# Patient Record
Sex: Female | Born: 1945 | Race: White | Hispanic: No | Marital: Married | Smoking: Former smoker
Health system: Southern US, Community
[De-identification: ages and names within clinical notes are randomized; demographics above are authoritative.]

## PROBLEM LIST (undated history)

## (undated) DIAGNOSIS — K219 Gastro-esophageal reflux disease without esophagitis: Secondary | ICD-10-CM

## (undated) DIAGNOSIS — R51 Headache: Secondary | ICD-10-CM

## (undated) DIAGNOSIS — E785 Hyperlipidemia, unspecified: Secondary | ICD-10-CM

## (undated) DIAGNOSIS — F329 Major depressive disorder, single episode, unspecified: Secondary | ICD-10-CM

## (undated) DIAGNOSIS — C649 Malignant neoplasm of unspecified kidney, except renal pelvis: Secondary | ICD-10-CM

## (undated) DIAGNOSIS — J302 Other seasonal allergic rhinitis: Secondary | ICD-10-CM

## (undated) DIAGNOSIS — T4145XA Adverse effect of unspecified anesthetic, initial encounter: Secondary | ICD-10-CM

## (undated) DIAGNOSIS — I10 Essential (primary) hypertension: Secondary | ICD-10-CM

## (undated) DIAGNOSIS — R55 Syncope and collapse: Secondary | ICD-10-CM

## (undated) DIAGNOSIS — F32A Depression, unspecified: Secondary | ICD-10-CM

## (undated) DIAGNOSIS — R0789 Other chest pain: Secondary | ICD-10-CM

## (undated) DIAGNOSIS — R519 Headache, unspecified: Secondary | ICD-10-CM

## (undated) DIAGNOSIS — C7951 Secondary malignant neoplasm of bone: Secondary | ICD-10-CM

## (undated) DIAGNOSIS — M1990 Unspecified osteoarthritis, unspecified site: Secondary | ICD-10-CM

## (undated) DIAGNOSIS — T8859XA Other complications of anesthesia, initial encounter: Secondary | ICD-10-CM

## (undated) HISTORY — DX: Hyperlipidemia, unspecified: E78.5

## (undated) HISTORY — PX: CYSTOCELE REPAIR: SHX163

## (undated) HISTORY — DX: Secondary malignant neoplasm of bone: C79.51

## (undated) HISTORY — DX: Malignant neoplasm of unspecified kidney, except renal pelvis: C64.9

## (undated) HISTORY — PX: OTHER SURGICAL HISTORY: SHX169

## (undated) HISTORY — DX: Other seasonal allergic rhinitis: J30.2

## (undated) HISTORY — PX: CARPAL TUNNEL RELEASE: SHX101

## (undated) HISTORY — DX: Other chest pain: R07.89

## (undated) HISTORY — DX: Syncope and collapse: R55

## (undated) HISTORY — PX: COLONOSCOPY: SHX174

## (undated) HISTORY — PX: DILATION AND CURETTAGE OF UTERUS: SHX78

## (undated) HISTORY — PX: TUBAL LIGATION: SHX77

## (undated) HISTORY — PX: JOINT REPLACEMENT: SHX530

## (undated) HISTORY — DX: Essential (primary) hypertension: I10

## (undated) HISTORY — PX: APPENDECTOMY: SHX54

---

## 1997-05-25 ENCOUNTER — Other Ambulatory Visit: Admission: RE | Admit: 1997-05-25 | Discharge: 1997-05-25 | Payer: Self-pay | Admitting: Gynecology

## 1998-03-14 ENCOUNTER — Other Ambulatory Visit: Admission: RE | Admit: 1998-03-14 | Discharge: 1998-03-14 | Payer: Self-pay | Admitting: Gynecology

## 1999-03-25 ENCOUNTER — Other Ambulatory Visit: Admission: RE | Admit: 1999-03-25 | Discharge: 1999-03-25 | Payer: Self-pay | Admitting: Gynecology

## 1999-10-10 ENCOUNTER — Encounter (INDEPENDENT_AMBULATORY_CARE_PROVIDER_SITE_OTHER): Payer: Self-pay

## 1999-10-10 ENCOUNTER — Other Ambulatory Visit: Admission: RE | Admit: 1999-10-10 | Discharge: 1999-10-10 | Payer: Self-pay | Admitting: Obstetrics and Gynecology

## 2000-02-07 ENCOUNTER — Encounter: Admission: RE | Admit: 2000-02-07 | Discharge: 2000-02-07 | Payer: Self-pay | Admitting: Family Medicine

## 2000-02-20 ENCOUNTER — Encounter: Admission: RE | Admit: 2000-02-20 | Discharge: 2000-02-20 | Payer: Self-pay | Admitting: Family Medicine

## 2000-05-05 ENCOUNTER — Other Ambulatory Visit: Admission: RE | Admit: 2000-05-05 | Discharge: 2000-05-05 | Payer: Self-pay | Admitting: Gynecology

## 2001-08-16 ENCOUNTER — Other Ambulatory Visit: Admission: RE | Admit: 2001-08-16 | Discharge: 2001-08-16 | Payer: Self-pay | Admitting: Gynecology

## 2001-09-06 ENCOUNTER — Ambulatory Visit (HOSPITAL_COMMUNITY): Admission: RE | Admit: 2001-09-06 | Discharge: 2001-09-06 | Payer: Self-pay | Admitting: Orthopedic Surgery

## 2002-02-14 ENCOUNTER — Observation Stay (HOSPITAL_COMMUNITY): Admission: EM | Admit: 2002-02-14 | Discharge: 2002-02-15 | Payer: Self-pay

## 2002-02-14 ENCOUNTER — Encounter: Payer: Self-pay | Admitting: Family Medicine

## 2002-02-14 ENCOUNTER — Encounter: Admission: RE | Admit: 2002-02-14 | Discharge: 2002-02-14 | Payer: Self-pay | Admitting: Family Medicine

## 2002-02-14 ENCOUNTER — Encounter (INDEPENDENT_AMBULATORY_CARE_PROVIDER_SITE_OTHER): Payer: Self-pay | Admitting: Specialist

## 2002-02-24 HISTORY — PX: ABDOMINAL HYSTERECTOMY: SHX81

## 2002-05-16 ENCOUNTER — Other Ambulatory Visit: Admission: RE | Admit: 2002-05-16 | Discharge: 2002-05-16 | Payer: Self-pay | Admitting: Obstetrics and Gynecology

## 2002-08-15 ENCOUNTER — Encounter (INDEPENDENT_AMBULATORY_CARE_PROVIDER_SITE_OTHER): Payer: Self-pay | Admitting: Specialist

## 2002-08-16 ENCOUNTER — Inpatient Hospital Stay (HOSPITAL_COMMUNITY): Admission: RE | Admit: 2002-08-16 | Discharge: 2002-08-17 | Payer: Self-pay | Admitting: Gynecology

## 2003-10-12 ENCOUNTER — Ambulatory Visit (HOSPITAL_COMMUNITY): Admission: RE | Admit: 2003-10-12 | Discharge: 2003-10-12 | Payer: Self-pay | Admitting: Gastroenterology

## 2003-10-12 ENCOUNTER — Encounter (INDEPENDENT_AMBULATORY_CARE_PROVIDER_SITE_OTHER): Payer: Self-pay | Admitting: Specialist

## 2005-10-09 ENCOUNTER — Encounter: Admission: RE | Admit: 2005-10-09 | Discharge: 2005-10-09 | Payer: Self-pay | Admitting: Specialist

## 2006-12-31 HISTORY — PX: BUNIONECTOMY: SHX129

## 2007-04-14 ENCOUNTER — Ambulatory Visit: Payer: Self-pay | Admitting: Vascular Surgery

## 2008-07-20 LAB — BASIC METABOLIC PANEL WITH GFR: Glucose: 104 mg/dL

## 2008-08-23 LAB — LIPID PANEL
Cholesterol: 113 mg/dL (ref 0–200)
HDL: 38 mg/dL (ref 35–70)
LDL Cholesterol: 52 mg/dL
Triglycerides: 112 mg/dL (ref 40–160)

## 2008-10-05 ENCOUNTER — Ambulatory Visit (HOSPITAL_COMMUNITY): Admission: RE | Admit: 2008-10-05 | Discharge: 2008-10-06 | Payer: Self-pay | Admitting: Urology

## 2010-03-08 ENCOUNTER — Encounter: Payer: Self-pay | Admitting: Cardiology

## 2010-03-08 DIAGNOSIS — R0789 Other chest pain: Secondary | ICD-10-CM | POA: Insufficient documentation

## 2010-03-08 DIAGNOSIS — E785 Hyperlipidemia, unspecified: Secondary | ICD-10-CM | POA: Insufficient documentation

## 2010-03-08 DIAGNOSIS — I10 Essential (primary) hypertension: Secondary | ICD-10-CM | POA: Insufficient documentation

## 2010-03-08 DIAGNOSIS — R55 Syncope and collapse: Secondary | ICD-10-CM | POA: Insufficient documentation

## 2010-03-28 ENCOUNTER — Ambulatory Visit (INDEPENDENT_AMBULATORY_CARE_PROVIDER_SITE_OTHER): Payer: Medicare Other | Admitting: Cardiology

## 2010-03-28 DIAGNOSIS — E7800 Pure hypercholesterolemia, unspecified: Secondary | ICD-10-CM

## 2010-03-28 DIAGNOSIS — I10 Essential (primary) hypertension: Secondary | ICD-10-CM

## 2010-03-28 DIAGNOSIS — R079 Chest pain, unspecified: Secondary | ICD-10-CM

## 2010-06-01 LAB — CBC
HCT: 40.1 % (ref 36.0–46.0)
Hemoglobin: 13.5 g/dL (ref 12.0–15.0)
MCHC: 33.5 g/dL (ref 30.0–36.0)
MCHC: 33.6 g/dL (ref 30.0–36.0)
MCV: 90.8 fL (ref 78.0–100.0)
Platelets: 172 10*3/uL (ref 150–400)
Platelets: 190 10*3/uL (ref 150–400)
RBC: 3.75 MIL/uL — ABNORMAL LOW (ref 3.87–5.11)
RBC: 4.41 MIL/uL (ref 3.87–5.11)
RDW: 13.9 % (ref 11.5–15.5)
WBC: 5.7 10*3/uL (ref 4.0–10.5)
WBC: 8.4 10*3/uL (ref 4.0–10.5)

## 2010-06-01 LAB — BASIC METABOLIC PANEL
BUN: 11 mg/dL (ref 6–23)
BUN: 11 mg/dL (ref 6–23)
CO2: 29 mEq/L (ref 19–32)
CO2: 29 mEq/L (ref 19–32)
Calcium: 8.4 mg/dL (ref 8.4–10.5)
Calcium: 9.6 mg/dL (ref 8.4–10.5)
Chloride: 108 mEq/L (ref 96–112)
Creatinine, Ser: 0.87 mg/dL (ref 0.4–1.2)
Creatinine, Ser: 1.03 mg/dL (ref 0.4–1.2)
GFR calc Af Amer: >60 mL/min (ref >60)
GFR calc Af Amer: >60 mL/min (ref >60)
GFR calc non Af Amer: 54 mL/min — ABNORMAL LOW (ref >60)
Glucose, Bld: 90 mg/dL (ref 70–99)
Potassium: 4.7 mEq/L (ref 3.5–5.1)
Sodium: 143 mEq/L (ref 135–145)

## 2010-06-01 LAB — ABO/RH: ABO/RH(D): A NEG

## 2010-06-01 LAB — TYPE AND SCREEN
ABO/RH(D): A NEG
Antibody Screen: NEGATIVE

## 2010-06-01 LAB — PROTIME-INR
INR: 0.9 (ref 0.00–1.49)
Prothrombin Time: 11.8 seconds (ref 11.6–15.2)

## 2010-07-02 ENCOUNTER — Encounter: Payer: Self-pay | Admitting: *Deleted

## 2010-07-02 ENCOUNTER — Encounter: Payer: Self-pay | Admitting: Cardiology

## 2010-07-03 ENCOUNTER — Other Ambulatory Visit: Payer: Self-pay | Admitting: Cardiology

## 2010-07-08 ENCOUNTER — Other Ambulatory Visit: Payer: Self-pay | Admitting: Cardiology

## 2010-07-08 NOTE — Telephone Encounter (Signed)
Has a question about her medication

## 2010-07-08 NOTE — Telephone Encounter (Signed)
Called stating she received order from [facility] for Micardis 20 mg. States she takes a 40 mg tablet and cuts it in half but didn't want [facility] to know that so she could more of the medication.

## 2010-07-09 NOTE — Op Note (Signed)
NAMELOGAN, [name]                ACCOUNT NO.:  [id number]   MEDICAL RECORD NO.:  [id number]          PATIENT TYPE:  AMB   LOCATION:  DAY                          FACILITY:  [facility]   PHYSICIAN:  Martina Sinner, MD DATE OF BIRTH:  1945/12/28   DATE OF PROCEDURE:  10/05/2008  DATE OF DISCHARGE:                               OPERATIVE REPORT   PREOPERATIVE DIAGNOSES:  Vault prolapse, cystocele, small rectocele.   SURGERY:  Vault prolapse repair plus cystocele repair plus graft plus  cystoscopy.   Cathy Lewis has the above diagnosis.  Preoperative lab tests were  normal.  She consented to the surgery.  Pros and cons and risks were  discussed.   The patient was prepped and draped in usual fashion.  Extra care was  taken of leg positioning to minimize the risk of compartment syndrome,  neuropathy and DVT.  Under anesthesia, there was no question she had an  apical defect of approximately 5 cm anteriorly with a grade 3 cystocele  and large central defect.  She had little to no posterior defect of any.  __________ estrogenized.   I marked a 3-0 Vicryl at the apex.  I made a T-shaped anterior vaginal  wall incision after instilling 27 mL of lidocaine epinephrine mixture to  help develop a plane.  I dissected the pubocervical fascia from the  overlying anterior vaginal wall to the white line bilaterally, and there  was no question mobilized nicely to the vaginal apex.   With the bladder emptied, I did a two-layer anterior repair flattening  the central defect.   I then cystoscoped the patient.  There was the typical camel hump  deformity in the bladder.  There was no injury to the bladder.  There  was excellent blue jets of indigo carmine bilaterally.   With the bladder emptied, I bluntly dissected to the ischial spine  bilaterally.  I mobilized the tissues away from the spine.  I could feel  nice sacrospinous ligament bilaterally.  I placed a 0 Ethilon used the  Capio caudal an  approximately 1.5 cm from the ischial spine medially and  caudally.  I was very happy when I double and triple checked this  position.   I placed a 0 Vicryl using a CT2 needle into the pelvic sidewall near the  urethrovesical angle.   I cut a 10 x 6-cm dermal graft like a trapezoid and sewed it using the 4-  corner sutures.  It fit in very nicely, not under tension.   I trimmed the 1.5 cm of anterior vaginal wall bilaterally.  I trimmed a  little bit at the apex.  I closed the anterior vaginal wall with running  0 Vicryl on the CT1 needle.   I had done a rectal examination twice, and there was no injury to the  bladder or stitch in the bladder.   At the end of the case, there was excellent vaginal length.  There was  excellent reduction of the cystocele.  There was minimal defect at the  apex posteriorly and no defect in the lower two-thirds  of the rectum or  the posterior vaginal wall.   I was very pleased the surgery.  Double vaginal pack with Estrace cream  was inserted.  Excellent urine output throughout the case.  Vitals  normal.   Hopefully, this operation will reach [name]'s treatment goal.   ASSISTANT:  Dr. Delia Chimes.           ______________________________  Martina Sinner, MD  Electronically Signed     SAM/MEDQ  D:  10/05/2008  T:  10/05/2008  Job:  [id number]

## 2010-07-09 NOTE — Procedures (Signed)
CAROTID DUPLEX EXAM   INDICATION:  Intermittent episodes of syncope for years.   HISTORY:  Diabetes:  No.  Cardiac:  No.  Hypertension:  Yes.  Smoking:  No.  Previous Surgery:  No.  CV History:  No.  Amaurosis Fugax No, Paresthesias No, Hemiparesis No                                       RIGHT             LEFT  Brachial systolic pressure:         122               120  Brachial Doppler waveforms:         Triphasic         Triphasic  Vertebral direction of flow:        Antegrade         Antegrade  DUPLEX VELOCITIES (cm/sec)  CCA peak systolic                   87                83  ECA peak systolic                   68                87  ICA peak systolic                   76                69  ICA end diastolic                   24                25  PLAQUE MORPHOLOGY:                  None              Mixed  PLAQUE AMOUNT:                      None              Mild  PLAQUE LOCATION:                    None              ICA   IMPRESSION:  1. Right internal carotid artery shows no evidence of stenosis.  2. Left internal carotid artery shows evidence of 1-19% stenosis.      ___________________________________________  Larina Earthly, M.D.   AS/MEDQ  D:  04/14/2007  T:  04/15/2007  Job:  [id number]

## 2010-07-12 NOTE — Op Note (Signed)
Cathy Lewis, Cathy Lewis                          ACCOUNT NO.:  [id number]   MEDICAL RECORD NO.:  [id number]                   PATIENT TYPE:  AMB   LOCATION:  DAY                                  FACILITY:  [facility]   PHYSICIAN:  Luvenia Redden, M.D.                DATE OF BIRTH:  January 23, 1946   DATE OF PROCEDURE:  08/15/2002  DATE OF DISCHARGE:                                 OPERATIVE REPORT   PREOPERATIVE DIAGNOSES:  1. Uterine prolapse.  2. Large rectocele.   POSTOPERATIVE DIAGNOSES:  1. Uterine prolapse.  2. Large rectocele.   OPERATION:  1. Laparoscopically-assisted vaginal hysterectomy and bilateral salpingo-     oophorectomy.  2. Posterior colpoperineorrhaphy.   SURGEON:  Luvenia Redden, M.D.   ASSISTANT:  Ilda Mori, M.D.   PROCEDURE:  Under good anesthesia, the patient was prepped and draped in a  sterile manner.  A Foley catheter was placed in the bladder.  A Hulka  tenaculum was placed on the uterus to achieve uterine mobility.  A  transverse incision was made at the lower border of the umbilicus.  A Veress  needle was inserted intraperitoneally.  Placement of the needle was verified  by a negative pressure reading on the manometer with upward traction of the  abdominal wall.  Pneumoperitoneum was formed with carbon dioxide and the  needle was removed.  A disposable cannula was used to insert  intraperitoneally through the subumbilical incision.  The trocar portion was  removed and the laparoscope was inserted.  The abdominal wall was  transilluminated so to avoid the major vessels in the abdominal wall.  Two  avascular sites were selected and 5 mm incisions were made.  Disposable 5 mm  trocar and cannula were introduced into the peritoneal cavity under direct  vision, trocars removed, and these operative openings were to introduce  instrumentation.  Pelvic organs were viewed.  There were a lot of intestines  in the pelvis, and these were mobilized up out of the  pelvis using a blunt  probe so that the pelvic organs could be viewed.  The uterus appeared to be  normal-sized.  It was mobile.  Both ovaries were small and atrophic.  Tubes  were normal except for the site of the previous sterilization.  The ureters  were viewed and were functioning.  It was noted there seemed to be somewhat  of a redundancy of her large intestine that actually started in the cecum  and all over into the sigmoid colon as well.  There was small adhesion of  epiploica to the left adnexa.  These were lysed by sharp dissection.  There  was no bleeding from this area.  The round ligament on the left side was  grasped, electrocoagulation was performed to 0 for an area of about 1 cm in  length of the round ligament, and then the round ligament was incised  and  divided.  This was done in two bites.  The left infundibulopelvic ligament  was then isolated.  The ureter was identified well below the operative site,  and the IP ligament was then cauterized and divided in two bites with  dissection and cautery, and dissection was carried up along the mesentery  until we got up to the level of the round ligament.  Attention was then  directed to the right side, where the round ligament was cauterized and  divided in two bites.  Next the right IP ligament was isolated and it was  cauterized to 0 in each case and then divided, the IP ligament in two bites.  Dissection was carried up along the mesentery with cautery and division  until the right adnexa was totally free.  Operative sites were inspected  under reduced pressure, and there was no active bleeding.  This portion of  the operation was then terminated.  We moved below, repositioned the legs.  The Hulka tenaculum was removed.  The  tenaculum was placed on the uterus,  and paracervical tissues were then infiltrated with lidocaine and Neo-  Synephrine solution.  The cervix was circumcised and the mucosa dissected  away by sharp and  blunt dissection.  A bladder flap was developed by blunt  dissection.  The peritoneal cavity was entered posteriorly sharply.  The  uterosacral ligaments were then clamped, cut, and ligated.  Cardinal  ligaments were clamped, cut, and ligated in two bites.  The peritoneum was  entered anteriorly and then the broad ligaments were clamped, cut, and  ligated, actually three small bites on either side, and the uterus was  liberated.  Stumps were inspected, and there was no active bleeding.  The  posterior cuff was run with a locked continuous 2-0 Vicryl to achieve  hemostasis.  The uterosacral ligaments were plicated into the midline for  upper vaginal support.  Stumps were again viewed.  There was no active  bleeding.  The peritoneum was closed with a 0 Monocryl suture.  The vaginal  incision was then closed with a locked continuous 2-0 Vicryl.  Attention was  then directed to the large rectocele.  A wedge-shaped portion of perineal  skin was excised.  The rectovaginal septum was infiltrated with a lidocaine  and Neo-Synephrine solution and by sharp and blunt dissection, the posterior  vaginal wall was dissected free from the rectovaginal septum so as to expose  the rectocele.  A wedge-shaped portion of vaginal mucosa was then excised.  Blunt dissection was further used to free up the rectum.  The large  rectocele was reduced using a pursestring suture of 2-0 Vicryl.  The  pararectal fascia and levator muscles were then approximated in the midline  with interrupted 0 Monocryl sutures.  Excess vaginal mucosa was excised.  The posterior vaginal mucosa was closed with a continuous locked 2-0 Vicryl  suture, and after this then the perineum was then closed first by using a  crown suture to recreate the perineal body, two smaller interrupted sutures  in the fascia and muscle to bring it into the midline.  The skin was closed with a subcuticular 3-0 Monocryl suture.  A two-inch iodoform gauze pack  was  placed into the vagina.  There was clear urine coming from the Foley  catheter at the end of the procedure.  Estimated blood loss was 100 mL.  None was replaced.  The patient tolerated the procedures well.  Gloves were  changed, and then  again insufflated the peritoneal cavity and with the  laparoscope inspected the upper stumps again in the operative site in the  pelvis.  There was no active bleeding seen and no active bleeding seen under  reduced pressure.  The operative cannulae were removed, and there was no  bleeding intraperitoneal from those sites.  The remainder of the  pneumoperitoneum was released through the subumbilical cannula as it was  withdrawn through the abdominal wall, and there was no active bleeding seen.  Skin incisions were closed using subcuticular 3-0 plain catgut in each case,  and Band-Aids were applied.  The patient was removed to recovery in good  condition.                                                Luvenia Redden, M.D.    WSB/MEDQ  D:  08/15/2002  T:  08/16/2002  Job:  [id number]

## 2010-07-12 NOTE — Op Note (Signed)
NAMEBRITTANNIE, Cathy Lewis                          ACCOUNT NO.:  [id number]   MEDICAL RECORD NO.:  [id number]                   PATIENT TYPE:  INP   LOCATION:  0102                                 FACILITY:  [facility]   PHYSICIAN:  Leonie Man, M.D.                DATE OF BIRTH:  [birth year]   DATE OF PROCEDURE:  02/14/2002  DATE OF DISCHARGE:                                 OPERATIVE REPORT   PREOPERATIVE DIAGNOSES:  Acute appendicitis.   POSTOPERATIVE DIAGNOSES:  Acute appendicitis.   PROCEDURE:  Laparoscopic appendectomy.   SURGEON:  Leonie Man, M.D.   ASSISTANT:  Nurse.   ANESTHESIA:  General.   INDICATIONS FOR PROCEDURE:  This patient is a 65 year old woman with right  lower quadrant pain over the last past three days with no associated nausea,  vomiting or diarrhea. No associated anorexia, some worsening pain over the  weekend seen by her primary care physician, Dr. Dara Hoyer, who sent her  for a CT scan of the abdomen which shows a dilated appendix consistent with  acute appendicitis. The patient comes to the operating room now after the  risks and potential benefits of surgery have been fully discussed. The  possibility of open surgery, possibility of infection, perforation, have all  been discussed with her and she understands and gives consent. All questions  were answered.   DESCRIPTION OF PROCEDURE:  Following the induction of satisfactory general  anesthesia with the patient positioned supine, the abdomen was routinely  prepped and draped including a sterile operative field. Open laparoscopy  created at the umbilicus with insertion of a Hasson cannula and insertion of  the camera. A suprapubic incision is made and a 10 mm trocar is placed under  direct vision. With the use of the 10 mm trocar, the instruments are  inserted and the appendix located and noted to be acutely inflamed. A 10/12  trocar was placed at the left lower quadrant. The appendix was grasped  and  held with a Glassner forceps and the mesoappendix was dissected. An EndoGIA  was used to transect the appendix at its base and a second firing of the  EndoGIA was used to transect the appendiceal mesentery. The right lower  quadrant was then thoroughly irrigated with saline, the appendix was placed  into an Endopouch and retrieved through the left lower quadrant wound. The  right lower quadrant was thoroughly irrigated with multiple aliquots of  normal saline. I attempted to visualize the patient's ovaries; however, due  to previous surgery and scarring in this area, I was unable to do this  laparoscopically. Sponge, instrument and sharp counts were then verified and  trocars removed under direct vision and a pneumoperitoneum deflated. The  wounds were then closed in layers, the umbilical wound with #0 Dexon and 4-0  Dexon as was the left lower quadrant  wound. The suprapubic wound was closed with 4-0 Dexon  suture. All wounds  were then reinforced with Steri-Strips and a sterile dressing was applied.  Anesthetic reversed, the patient removed from the operating room to the  recovery room in stable condition. She tolerated the procedure well.                                               Leonie Man, M.D.    PB/MEDQ  D:  02/14/2002  T:  02/14/2002  Job:  [id number]   cc:   Teena Irani. Arlyce Dice, M.D.  [address]  [place]  [place] [address]  Fax: [phone]

## 2010-07-12 NOTE — Op Note (Signed)
[facility]  Patient:    Cathy Lewis, Cathy Lewis Visit Number: [id number] MRN: [id number]          Service Type: DSU Location: DAY Attending Physician:  Skip Mayer Dictated by:   Georges Lynch Darrelyn Hillock, M.D. Proc. 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  OPERATION:  Right carpal tunnel release.  SURGEON:  Georges Lynch. Darrelyn Hillock, M.D.  ASSISTANT:  Nurse.  DESCRIPTION OF PROCEDURE:  Under IV regional anesthesia, routine orthopedic prep and drape of right upper extremity was carried out.  A lazy S incision was made over the palmar aspect of the right hand.  Bleeders were identified and cauterized.  At this time, I identified palmaris tendon and gently retracted it.  I then identified the superficial and deep transverse carpal ligaments.  I protected the median nerve, and then I released the ligaments in the usual fashion to completely open up the carpal tunnel.  This was done down into the palm of the hand.  The nerve now was free.  There were no other abnormalities within the tunnel.  I thoroughly irrigated out the area and then sutured the skin only with 3-0 nylon sutures.  Sterile Neosporin and bundle dressings applied.  The patient left the operating room in satisfactory condition.  For home, she will have Percocet 10/650 for pain, 1 every 4 hours p.r.n.  I will see her in the office Friday or prior to that if there are any problems. Dictated by:   Georges Lynch Darrelyn Hillock, M.D. Attending Physician:  Skip Mayer DD:  09/06/01 TD:  09/08/01 Job: [id number]

## 2010-07-12 NOTE — Op Note (Signed)
NAME:  Cathy Lewis, Cathy Lewis                          ACCOUNT NO.:  [id number]   MEDICAL RECORD NO.:  [id number]                   PATIENT TYPE:  AMB   LOCATION:  ENDO                                 FACILITY:  [facility]   PHYSICIAN:  Anselmo Rod, M.D.               DATE OF BIRTH:  1945/07/10   DATE OF PROCEDURE:  10/12/2003  DATE OF DISCHARGE:                                 OPERATIVE REPORT   ADDENDUM TO REPORT #[id number]   IMPRESSION:  The patient had a normal-appearing terminal ileum.                                               Anselmo Rod, M.D.    JNM/MEDQ  D:  10/12/2003  T:  10/12/2003  Job:  [id number]

## 2010-07-12 NOTE — Op Note (Signed)
   NAME:  Cathy Lewis, Cathy Lewis                          ACCOUNT NO.:  [id number]   MEDICAL RECORD NO.:  [id number]                   PATIENT TYPE:  OBV   LOCATION:  0474                                 FACILITY:  [facility]   PHYSICIAN:  Luvenia Redden, M.D.                DATE OF BIRTH:  Feb 18, 1946   DATE OF PROCEDURE:  08/16/2002  DATE OF DISCHARGE:                                 OPERATIVE REPORT   PREOPERATIVE DIAGNOSIS:  Postoperative vaginal bleeding.   POSTOPERATIVE DIAGNOSIS:  Postoperative vaginal bleeding due to bleeding  from posterior repair incision.   OPERATION:  Examination under anesthesia and suture ligation of two bleeding  points in the posterior repair incision.   SURGEON:  Luvenia Redden, M.D.   PROCEDURE:  Under good anesthesia, the patient prepped and draped in a  sterile manner, the bladder was catheterized.  The vagina was then explored  and evacuated of a clot that was approximately 150-200 mL of blood.  Once  the vagina was cleared of blood clot, inspection of the vaginal apex  incision revealed it to be dry and without any bleeding points.  At the apex  of the posterior repair incision there was a steady ooze of bright red blood  and approximately an inch below that, there was another bleeding point that  was seen.  Both areas were suture ligated with figure-of-eight 0 Vicryl  suture and bleeding was thus controlled.  Both incisions, the one at the  apex of the vagina and the posterior repair incision, were then observed and  inspected for several minutes.  Both incisions were dry to inspection, and  there was no further bleeding seen.  A Foley catheter was placed in the  bladder and a two-inch iodoform gauze pack was placed in the vagina to apply  pressure to the incision, and the procedure was terminated.  Estimated blood  loss including the blood clot that was evacuated was approximately 200 mL.  The patient tolerated the procedure well and was returned to  recovery in  good condition.                                               Luvenia Redden, M.D.    WSB/MEDQ  D:  08/16/2002  T:  08/16/2002  Job:  [id number]

## 2010-07-12 NOTE — Op Note (Signed)
NAME:  Cathy Lewis, ASTORINO                          ACCOUNT NO.:  [id number]   MEDICAL RECORD NO.:  [id number]                   PATIENT TYPE:  AMB   LOCATION:  ENDO                                 FACILITY:  [facility]   PHYSICIAN:  Anselmo Rod, M.D.               DATE OF BIRTH:  04/12/1945   DATE OF PROCEDURE:  10/12/2003  DATE OF DISCHARGE:                                 OPERATIVE REPORT   PROCEDURE PERFORMED:  Colonoscopy with snare polypectomy times one and cold  biopsies times three.   ENDOSCOPIST:  Charna Elizabeth, M.D.   INSTRUMENT USED:  Olympus video colonoscope.   INDICATIONS FOR PROCEDURE:  The patient is a 65 year old white female  undergoing screening colonoscopy to rule out colonic polyps, masses, etc.   PREPROCEDURE PREPARATION:  Informed consent was procured from the patient.  The patient was fasted for eight hours prior to the procedure and prepped  with a bottle of magnesium citrate and a gallon of GoLYTELY the night prior  to the procedure.   PREPROCEDURE PHYSICAL:  The patient had stable vital signs.  Neck supple.  Chest clear to auscultation.  S1 and S2 regular.  Abdomen soft with normal  bowel sounds.   DESCRIPTION OF PROCEDURE:  The patient was placed in left lateral decubitus  position and sedated with 70 mg of Demerol and 7 mg of Versed in slow  incremental doses.  Once the patient was adequately sedated and maintained  on low flow oxygen and continuous cardiac monitoring, the Olympus video  colonoscope was advanced from the rectum to the cecum.  The patient's  position was changed from the left lateral to the supine position with  gentle application of abdominal pressure to reach the cecal basel.  The  appendicular orifice and ileocecal valve were clearly visualized and  photographed.  Multiple washes were done.  There was some residual stool in  the dependent areas of the colon but visualization was adequate.  A small  sessile polyp was snared from the  proximal right colon.  Two small sessile  polyps were biopsied from the rectum.  Small internal hemorrhoids were seen  on retroflexion.  There was no evidence of diverticulosis.  The patient  tolerated the procedure well without complications.   IMPRESSION:  1. Small sessile polyp snared from proximal right colon.  2. Two small sessile polyps biopsied from the rectum.  3. Small internal hemorrhoids.  4. No evidence of diverticulosis.   RECOMMENDATIONS:  1. Await pathology results.  2. Repeat colorectal cancer screening depending on pathology results.  3. Avoid all nonsteroidals for the next four weeks.  4. Outpatient followup as need arises in the future.  5. Continue high fiber diet with liberal fluid intake.  6. The patient has a normal-appearing terminal ileum.  Anselmo Rod, M.D.    JNM/MEDQ  D:  10/12/2003  T:  10/12/2003  Job:  [id number]   cc:   Marjory Lies, M.D.  [address]  [place]  [place] [address]  Fax: [phone]

## 2010-07-23 ENCOUNTER — Other Ambulatory Visit: Payer: Self-pay | Admitting: Specialist

## 2010-07-23 DIAGNOSIS — M545 Low back pain: Secondary | ICD-10-CM

## 2010-07-28 ENCOUNTER — Ambulatory Visit
Admission: RE | Admit: 2010-07-28 | Discharge: 2010-07-28 | Disposition: A | Discharge status: Home / Self Care | Payer: Medicare Other | Source: Ambulatory Visit | Attending: Specialist | Admitting: Specialist

## 2010-07-28 DIAGNOSIS — M545 Low back pain: Secondary | ICD-10-CM

## 2011-02-21 ENCOUNTER — Encounter (HOSPITAL_COMMUNITY): Payer: Self-pay | Admitting: Pharmacy Technician

## 2011-02-27 ENCOUNTER — Other Ambulatory Visit: Payer: Self-pay | Admitting: Pain Medicine

## 2011-03-03 ENCOUNTER — Other Ambulatory Visit: Payer: Self-pay | Admitting: Pain Medicine

## 2011-03-04 NOTE — H&P (Signed)
NAME:  Cathy Lewis, Cathy Lewis                     ACCOUNT NO.:  MEDICAL RECORD NO.:  [id number]  LOCATION:                                 FACILITY:  PHYSICIAN:  Erasmo Leventhal, M.D.DATE OF BIRTH:  12-24-1945  DATE OF ADMISSION:  03/11/2011 DATE OF DISCHARGE:                             HISTORY & PHYSICAL   ADMISSION DIAGNOSIS:  Painful loss of range of motion, right knee.  HISTORY OF PRESENT ILLNESS:  The patient is a 66 year old female who has been evaluated and treated by Dr. Thomasena Edis for issues related to her right knee.  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  The patient has elected to proceed with a total knee arthroplasty.  She is to review the findings with Dr. Thomasena Edis, the pros and cons.  The patient has been evaluated by her primary care physician and is cleared for this upcoming surgical procedure, so the patient would like to proceed.  X-rays reveal she has end-stage osteoarthritis of the knee with bone-on-bone medial compartment with significant patellofemoral degenerative changes.  ALLERGIES: 1. __________ causing aching joints and racing heart. 2. AVELOX causing chills and heart palpitations. 3. TRAMADOL causing dizziness and sickness to the stomach.  CURRENT MEDICATIONS: 1. Benazepril 20 mg once a day. 2. Singulair 10 mg once a day. 3. Crestor 10 mg once a day. 4. Advair 150/50 mcg 1 puff twice a day. 5. Baby aspirin once a day. 6. Meclizine p.r.n.  PRIMARY CARE PHYSICIAN:  Marjory Lies, MD  CARDIOLOGIST:  Peter M. Swaziland, MD  UROLOGIST:  Maretta Bees. Vonita Moss, MD  PAST MEDICAL HISTORY: 1. Vertigo. 2. Asthma. 3. Hypertension. 4. Hypercholesterolemia. 5. Right knee osteoarthritis.  REVIEW OF SYSTEMS:  NEUROLOGIC:  Negative for any strokes, seizures, convulsions, migraines.  No issues related to anxiety or depression. PULMONARY:  She denies any history of shortness of breath, productive cough, or  wheezing.  Medications are able to control her asthma, she has never been hospitalized.  No tuberculosis, COPD, or sleep apnea. CARDIOVASCULAR:  She denies any chest pain, racing heart, skipping beats.  No signs of lightheadedness or dizziness due to irregular heart rhythms.  Past stress test was in 2000 and was normal.  GI:  She denies any acid reflux.  No ulcers.  No hepatitis, jaundice, cholecystitis, diverticular issues.  GU:  She denies any problems with urinary tract infections, kidney stones, or holding her urine.  ENDOCRINE:  She denies any thyroid issues or diabetic issues.  HEMATOLOGIC:  She denies any history of anemia, transfusions, blood clots.  PAST SURGICAL HISTORY:  Valve prolapse procedure, right foot surgery, right knee arthroscopy, hysterectomy, rectocele, right hand carpal tunnel syndrome, appendix, cystocele, D and C, and tubal ligation with no complications to anesthesia other than constipation.  FAMILY MEDICAL HISTORY:  Father is deceased from heart disease at the age of 31, mother from cancer at age 44.  Three sisters, all have had heart attacks, two with bypass and multiple stents.  SOCIAL HISTORY:  Patient is married.  She is a Medical sales representative at [employer].  She has smoked in the past, stopped about 20 years previous.  She lives in a one-storey one level home.  PHYSICAL EXAMINATION:  VITAL SIGNS:  Height is 5 feet 5 inches, weight 144 pounds, blood pressure 118/68, pulse 80 and regular, respirations are nonlabored at 14.  The patient is afebrile. GENERAL:  This is a healthy-appearing female, conscious, alert, appropriate, appears to be a good historian.  Easily gets in and out of the exam table on chair without any difficulty. NECK:  Supple.  No palpable lymphadenopathies. HEART:  Regular rate rhythm. CHEST:  Lung sounds clear throughout.  No wheezing. ABDOMEN:  Soft, bowel sounds present. EXTREMITIES:  Upper extremities have excellent range of  motion. Shoulders, elbows, and wrists with good motor strength.  Lower extremities, right knee, she is able to fully extend.  She can flex it back to 120.  She has no ligament instability.  Calf is soft and nontender.  Her left knee, she is able to fully extend and flex it back to 120.  Calf is soft and nontender.  No signs of phlebitis in either lower extremity.  Good pulses in the ankles. PERIPHERAL VASCULAR:  Carotid pulses are 2+.  No bruits.  Radial pulses are 2+.  Posterior tibial pulses are 1+.  She has no lower extremity edema or venous stasis changes. BREAST/RECTAL/GU:  Deferred.  IMPRESSION: 1. End-stage osteoarthritis of right knee with bone-on-bone medial     compartment with significant patellofemoral changes. 2. Hypertension. 3. Hypercholesterolemia. 4. History of vertigo. 5. History of asthma.  PLAN:  The patient has been evaluated and cleared by her primary care physician, Dr. Marjory Lies.  The patient will undergo all routine labs and tests prior to having a right total knee arthroplasty by Dr. Thomasena Edis at [facility] on March 11, 2011.     Jamelle Rushing, P.A.   ______________________________ Erasmo Leventhal, M.D.    RWK/MEDQ  D:  03/03/2011  T:  03/03/2011  Job:  [id number]

## 2011-03-05 ENCOUNTER — Encounter (HOSPITAL_COMMUNITY): Payer: Self-pay

## 2011-03-05 ENCOUNTER — Encounter (HOSPITAL_COMMUNITY)
Admission: RE | Admit: 2011-03-05 | Discharge: 2011-03-05 | Disposition: A | Discharge status: Home / Self Care | Payer: Medicare Other | Source: Ambulatory Visit | Attending: Specialist | Admitting: Specialist

## 2011-03-05 HISTORY — DX: Unspecified osteoarthritis, unspecified site: M19.90

## 2011-03-05 HISTORY — DX: Gastro-esophageal reflux disease without esophagitis: K21.9

## 2011-03-05 LAB — CBC
Hemoglobin: 13.4 g/dL (ref 12.0–15.0)
MCH: 29 pg (ref 26.0–34.0)
MCHC: 32.1 g/dL (ref 30.0–36.0)
MCV: 90.3 fL (ref 78.0–100.0)
Platelets: 246 10*3/uL (ref 150–400)
RBC: 4.62 MIL/uL (ref 3.87–5.11)

## 2011-03-05 LAB — PROTIME-INR
INR: 0.95 (ref 0.00–1.49)
Prothrombin Time: 12.9 seconds (ref 11.6–15.2)

## 2011-03-05 LAB — COMPREHENSIVE METABOLIC PANEL
CO2: 29 mEq/L (ref 19–32)
Calcium: 9.5 mg/dL (ref 8.4–10.5)
Creatinine, Ser: 1.07 mg/dL (ref 0.50–1.10)
GFR calc Af Amer: 61 mL/min — ABNORMAL LOW (ref >90)
GFR calc non Af Amer: 52 mL/min — ABNORMAL LOW (ref >90)
Glucose, Bld: 85 mg/dL (ref 70–99)

## 2011-03-05 LAB — DIFFERENTIAL
Basophils Absolute: 0 10*3/uL (ref 0.0–0.1)
Lymphocytes Relative: 42 % (ref 12–46)
Neutro Abs: 3.1 10*3/uL (ref 1.7–7.7)

## 2011-03-05 LAB — URINALYSIS, ROUTINE W REFLEX MICROSCOPIC
Hgb urine dipstick: NEGATIVE
Leukocytes, UA: NEGATIVE
Protein, ur: NEGATIVE mg/dL
Urobilinogen, UA: 0.2 mg/dL (ref 0.0–1.0)

## 2011-03-05 LAB — SURGICAL PCR SCREEN: MRSA, PCR: NEGATIVE

## 2011-03-05 NOTE — Pre-Procedure Instructions (Signed)
2010 Stress test results in EPIC

## 2011-03-05 NOTE — Patient Instructions (Signed)
20 Cathy Lewis  03/05/2011   Your procedure is scheduled on:  03/11/11 230pm-445pm  Report to [facility] at 1230pm.  Call this number if you have problems the morning of surgery: [phone]   Remember:   Do not eat food:After Midnight.  May have clear liquids:until Midnight .  Clear liquids include soda, tea, black coffee, apple or grape juice, broth.  Take these medicines the morning of surgery with A SIP OF WATER:    Do not wear jewelry, make-up or nail polish.  Do not wear lotions, powders, or perfumes. .  Do not shave 48 hours prior to surgery.  Do not bring valuables to the hospital.  Contacts, dentures or bridgework may not be worn into surgery.  Leave suitcase in the car. After surgery it may be brought to your room.  For patients admitted to the hospital, checkout time is 11:00 AM the day of discharge.    Special Instructions: CHG Shower Use Special Wash: 1/2 bottle night before surgery and 1/2 bottle morning of surgery. shower chin to toes with CHG.  Wash face and private parts with regular soap.     Please read over the following fact sheets that you were given: MRSA Information, Blood Transfusion Fact Sheet, Incentive Spirometry Fact sheet, coughing and deep breathing exercises, leg exercises.

## 2011-03-11 ENCOUNTER — Inpatient Hospital Stay (HOSPITAL_COMMUNITY)
Admission: RE | Admit: 2011-03-11 | Discharge: 2011-03-14 | DRG: 470 | Disposition: A | Discharge status: Home Health | Payer: Medicare Other | Source: Ambulatory Visit | Attending: Specialist | Admitting: Specialist

## 2011-03-11 ENCOUNTER — Encounter (HOSPITAL_COMMUNITY): Payer: Self-pay | Admitting: Anesthesiology

## 2011-03-11 ENCOUNTER — Encounter (HOSPITAL_COMMUNITY)
Admission: RE | Disposition: A | Discharge status: Home Health | Payer: Self-pay | Source: Ambulatory Visit | Attending: Specialist

## 2011-03-11 ENCOUNTER — Encounter (HOSPITAL_COMMUNITY): Payer: Self-pay | Admitting: *Deleted

## 2011-03-11 ENCOUNTER — Inpatient Hospital Stay (HOSPITAL_COMMUNITY): Payer: Medicare Other | Admitting: Anesthesiology

## 2011-03-11 DIAGNOSIS — R55 Syncope and collapse: Secondary | ICD-10-CM | POA: Diagnosis not present

## 2011-03-11 DIAGNOSIS — M1710 Unilateral primary osteoarthritis, unspecified knee: Principal | ICD-10-CM | POA: Diagnosis present

## 2011-03-11 DIAGNOSIS — D62 Acute posthemorrhagic anemia: Secondary | ICD-10-CM | POA: Diagnosis not present

## 2011-03-11 DIAGNOSIS — I10 Essential (primary) hypertension: Secondary | ICD-10-CM

## 2011-03-11 DIAGNOSIS — E785 Hyperlipidemia, unspecified: Secondary | ICD-10-CM

## 2011-03-11 DIAGNOSIS — K219 Gastro-esophageal reflux disease without esophagitis: Secondary | ICD-10-CM | POA: Diagnosis present

## 2011-03-11 DIAGNOSIS — E871 Hypo-osmolality and hyponatremia: Secondary | ICD-10-CM | POA: Diagnosis not present

## 2011-03-11 DIAGNOSIS — Z01812 Encounter for preprocedural laboratory examination: Secondary | ICD-10-CM

## 2011-03-11 DIAGNOSIS — R0789 Other chest pain: Secondary | ICD-10-CM

## 2011-03-11 HISTORY — PX: TOTAL KNEE ARTHROPLASTY: SHX125

## 2011-03-11 LAB — TYPE AND SCREEN

## 2011-03-11 SURGERY — ARTHROPLASTY, KNEE, TOTAL
Anesthesia: Spinal | Site: Knee | Laterality: Right | Wound class: Clean

## 2011-03-11 MED ORDER — ACETAMINOPHEN 325 MG PO TABS
650.0000 mg | ORAL_TABLET | Freq: Four times a day (QID) | ORAL | Status: DC | PRN
  Administered 2011-03-12: 650 mg via ORAL
  Filled 2011-03-11: qty 2

## 2011-03-11 MED ORDER — ENOXAPARIN (LOVENOX) PATIENT EDUCATION KIT
PACK | Freq: Once | Status: AC
  Administered 2011-03-12: 08:00:00
  Filled 2011-03-11: qty 1

## 2011-03-11 MED ORDER — BUPIVACAINE IN DEXTROSE 0.75-8.25 % IT SOLN
INTRATHECAL | Status: DC | PRN
  Administered 2011-03-11: 2 mL via INTRATHECAL

## 2011-03-11 MED ORDER — BENAZEPRIL HCL 20 MG PO TABS
20.0000 mg | ORAL_TABLET | Freq: Every day | ORAL | Status: DC
  Administered 2011-03-11 – 2011-03-12 (×2): 20 mg via ORAL
  Filled 2011-03-11 (×3): qty 1

## 2011-03-11 MED ORDER — MAGNESIUM OXIDE 400 MG PO TABS
400.0000 mg | ORAL_TABLET | Freq: Every day | ORAL | Status: DC
  Administered 2011-03-13 – 2011-03-14 (×2): 400 mg via ORAL
  Filled 2011-03-11 (×4): qty 1

## 2011-03-11 MED ORDER — ZOLPIDEM TARTRATE 5 MG PO TABS: 5.0000 mg | ORAL_TABLET | Freq: Every evening | ORAL | Status: DC | PRN

## 2011-03-11 MED ORDER — FERROUS SULFATE 325 (65 FE) MG PO TABS
325.0000 mg | ORAL_TABLET | Freq: Three times a day (TID) | ORAL | Status: DC
  Administered 2011-03-12 – 2011-03-14 (×7): 325 mg via ORAL
  Filled 2011-03-11 (×9): qty 1

## 2011-03-11 MED ORDER — ALUM & MAG HYDROXIDE-SIMETH 200-200-20 MG/5ML PO SUSP: 30.0000 mL | ORAL | Status: DC | PRN

## 2011-03-11 MED ORDER — MENTHOL 3 MG MT LOZG
1.0000 | LOZENGE | OROMUCOSAL | Status: DC | PRN
  Filled 2011-03-11: qty 9

## 2011-03-11 MED ORDER — FLUTICASONE-SALMETEROL 100-50 MCG/DOSE IN AEPB
1.0000 | INHALATION_SPRAY | Freq: Two times a day (BID) | RESPIRATORY_TRACT | Status: DC
  Administered 2011-03-12 – 2011-03-14 (×5): 1 via RESPIRATORY_TRACT
  Filled 2011-03-11: qty 14

## 2011-03-11 MED ORDER — VITAMIN B-12 1000 MCG PO TABS
1000.0000 ug | ORAL_TABLET | Freq: Every day | ORAL | Status: DC
  Administered 2011-03-13 – 2011-03-14 (×2): 1000 ug via ORAL
  Filled 2011-03-11 (×4): qty 1

## 2011-03-11 MED ORDER — POTASSIUM CHLORIDE IN NACL 20-0.9 MEQ/L-% IV SOLN
INTRAVENOUS | Status: DC
  Administered 2011-03-11 – 2011-03-12 (×2): via INTRAVENOUS
  Filled 2011-03-11 (×2): qty 1000

## 2011-03-11 MED ORDER — BISACODYL 10 MG RE SUPP: 10.0000 mg | Freq: Every day | RECTAL | Status: DC | PRN

## 2011-03-11 MED ORDER — SODIUM CHLORIDE 0.9 % IV SOLN: INTRAVENOUS | Status: DC

## 2011-03-11 MED ORDER — CEFAZOLIN SODIUM 1-5 GM-% IV SOLN
1.0000 g | INTRAVENOUS | Status: AC
  Administered 2011-03-11: 1 g via INTRAVENOUS

## 2011-03-11 MED ORDER — FLEET ENEMA 7-19 GM/118ML RE ENEM: 1.0000 | ENEMA | Freq: Once | RECTAL | Status: AC | PRN

## 2011-03-11 MED ORDER — ROSUVASTATIN CALCIUM 10 MG PO TABS
10.0000 mg | ORAL_TABLET | Freq: Every day | ORAL | Status: DC
  Administered 2011-03-11 – 2011-03-13 (×3): 10 mg via ORAL
  Filled 2011-03-11 (×4): qty 1

## 2011-03-11 MED ORDER — LACTATED RINGERS IV SOLN
INTRAVENOUS | Status: DC
  Administered 2011-03-11: 1000 mL via INTRAVENOUS
  Administered 2011-03-11: 15:00:00 via INTRAVENOUS

## 2011-03-11 MED ORDER — PROMETHAZINE HCL 25 MG/ML IJ SOLN: 6.2500 mg | INTRAMUSCULAR | Status: DC | PRN

## 2011-03-11 MED ORDER — ALBUTEROL 90 MCG/ACT IN AERS
2.0000 | INHALATION_SPRAY | RESPIRATORY_TRACT | Status: DC | PRN
  Filled 2011-03-11: qty 2

## 2011-03-11 MED ORDER — HYDROMORPHONE HCL PF 1 MG/ML IJ SOLN: 0.2500 mg | INTRAMUSCULAR | Status: DC | PRN

## 2011-03-11 MED ORDER — METOCLOPRAMIDE HCL 5 MG/ML IJ SOLN: 5.0000 mg | Freq: Three times a day (TID) | INTRAMUSCULAR | Status: DC | PRN

## 2011-03-11 MED ORDER — ONDANSETRON HCL 4 MG PO TABS: 4.0000 mg | ORAL_TABLET | Freq: Four times a day (QID) | ORAL | Status: DC | PRN

## 2011-03-11 MED ORDER — MONTELUKAST SODIUM 10 MG PO TABS
10.0000 mg | ORAL_TABLET | Freq: Every day | ORAL | Status: DC
  Administered 2011-03-11 – 2011-03-13 (×3): 10 mg via ORAL
  Filled 2011-03-11 (×4): qty 1

## 2011-03-11 MED ORDER — DOCUSATE SODIUM 100 MG PO CAPS
100.0000 mg | ORAL_CAPSULE | Freq: Two times a day (BID) | ORAL | Status: DC
  Administered 2011-03-11 – 2011-03-14 (×6): 100 mg via ORAL
  Filled 2011-03-11 (×7): qty 1

## 2011-03-11 MED ORDER — CEFAZOLIN SODIUM 1-5 GM-% IV SOLN
1.0000 g | Freq: Four times a day (QID) | INTRAVENOUS | Status: AC
  Administered 2011-03-11 – 2011-03-12 (×3): 1 g via INTRAVENOUS
  Filled 2011-03-11 (×3): qty 50

## 2011-03-11 MED ORDER — MECLIZINE HCL 12.5 MG PO TABS
12.5000 mg | ORAL_TABLET | Freq: Three times a day (TID) | ORAL | Status: DC | PRN
  Filled 2011-03-11: qty 1

## 2011-03-11 MED ORDER — CEFAZOLIN SODIUM 1-5 GM-% IV SOLN
INTRAVENOUS | Status: DC | PRN
  Administered 2011-03-11: 1 g via INTRAVENOUS

## 2011-03-11 MED ORDER — METOCLOPRAMIDE HCL 10 MG PO TABS: 5.0000 mg | ORAL_TABLET | Freq: Three times a day (TID) | ORAL | Status: DC | PRN

## 2011-03-11 MED ORDER — LIDOCAINE HCL (CARDIAC) 20 MG/ML IV SOLN
INTRAVENOUS | Status: DC | PRN
  Administered 2011-03-11: 100 mg via INTRAVENOUS

## 2011-03-11 MED ORDER — FAMOTIDINE 20 MG PO TABS
20.0000 mg | ORAL_TABLET | Freq: Two times a day (BID) | ORAL | Status: DC | PRN
  Filled 2011-03-11: qty 1

## 2011-03-11 MED ORDER — HYDROMORPHONE HCL PF 1 MG/ML IJ SOLN: 0.5000 mg | INTRAMUSCULAR | Status: DC | PRN

## 2011-03-11 MED ORDER — OXYCODONE HCL 5 MG PO TABS
5.0000 mg | ORAL_TABLET | ORAL | Status: DC | PRN
  Administered 2011-03-11 – 2011-03-12 (×2): 10 mg via ORAL
  Filled 2011-03-11 (×2): qty 2

## 2011-03-11 MED ORDER — PROPOFOL 10 MG/ML IV EMUL
INTRAVENOUS | Status: DC | PRN
  Administered 2011-03-11: 140 ug/kg/min via INTRAVENOUS

## 2011-03-11 MED ORDER — METHOCARBAMOL 500 MG PO TABS
500.0000 mg | ORAL_TABLET | Freq: Four times a day (QID) | ORAL | Status: DC | PRN
  Administered 2011-03-11 – 2011-03-13 (×5): 500 mg via ORAL
  Filled 2011-03-11 (×6): qty 1

## 2011-03-11 MED ORDER — HYDROMORPHONE HCL PF 1 MG/ML IJ SOLN
INTRAMUSCULAR | Status: AC
  Administered 2011-03-11: 1 mg
  Filled 2011-03-11: qty 1

## 2011-03-11 MED ORDER — ONDANSETRON HCL 4 MG/2ML IJ SOLN: 4.0000 mg | Freq: Four times a day (QID) | INTRAMUSCULAR | Status: DC | PRN

## 2011-03-11 MED ORDER — ONDANSETRON HCL 4 MG/2ML IJ SOLN
INTRAMUSCULAR | Status: DC | PRN
  Administered 2011-03-11: 4 mg via INTRAVENOUS

## 2011-03-11 MED ORDER — ACETAMINOPHEN 650 MG RE SUPP: 650.0000 mg | Freq: Four times a day (QID) | RECTAL | Status: DC | PRN

## 2011-03-11 MED ORDER — MORPHINE SULFATE 2 MG/ML IJ SOLN
2.0000 mg | INTRAMUSCULAR | Status: DC | PRN
  Administered 2011-03-11 – 2011-03-12 (×4): 2 mg via INTRAVENOUS
  Filled 2011-03-11 (×4): qty 1

## 2011-03-11 MED ORDER — POLYETHYLENE GLYCOL 3350 17 G PO PACK
17.0000 g | PACK | Freq: Every day | ORAL | Status: DC | PRN
  Filled 2011-03-11: qty 1

## 2011-03-11 MED ORDER — MAGNESIUM 500 MG PO CAPS: 1.0000 | ORAL_CAPSULE | Freq: Every day | ORAL | Status: DC

## 2011-03-11 MED ORDER — ENOXAPARIN SODIUM 30 MG/0.3ML ~~LOC~~ SOLN
30.0000 mg | Freq: Two times a day (BID) | SUBCUTANEOUS | Status: DC
  Administered 2011-03-12 – 2011-03-14 (×5): 30 mg via SUBCUTANEOUS
  Filled 2011-03-11 (×7): qty 0.3

## 2011-03-11 MED ORDER — POVIDONE-IODINE 7.5 % EX SOLN: Freq: Once | CUTANEOUS | Status: DC

## 2011-03-11 MED ORDER — CHLORHEXIDINE GLUCONATE 4 % EX LIQD: 60.0000 mL | Freq: Once | CUTANEOUS | Status: DC

## 2011-03-11 MED ORDER — FENTANYL CITRATE 0.05 MG/ML IJ SOLN
INTRAMUSCULAR | Status: DC | PRN
  Administered 2011-03-11 (×2): 50 ug via INTRAVENOUS

## 2011-03-11 MED ORDER — METHOCARBAMOL 100 MG/ML IJ SOLN
500.0000 mg | Freq: Four times a day (QID) | INTRAVENOUS | Status: DC | PRN
  Filled 2011-03-11: qty 5

## 2011-03-11 MED ORDER — MIDAZOLAM HCL 5 MG/5ML IJ SOLN
INTRAMUSCULAR | Status: DC | PRN
  Administered 2011-03-11: 2 mg via INTRAVENOUS

## 2011-03-11 MED ORDER — PHENOL 1.4 % MT LIQD
1.0000 | OROMUCOSAL | Status: DC | PRN
  Filled 2011-03-11: qty 177

## 2011-03-11 MED ORDER — FLUTICASONE PROPIONATE 50 MCG/ACT NA SUSP
1.0000 | Freq: Two times a day (BID) | NASAL | Status: DC | PRN
  Filled 2011-03-11: qty 16

## 2011-03-11 MED ORDER — VITAMIN C 500 MG PO TABS
1000.0000 mg | ORAL_TABLET | Freq: Every day | ORAL | Status: DC
  Administered 2011-03-13 – 2011-03-14 (×2): 1000 mg via ORAL
  Filled 2011-03-11 (×4): qty 2

## 2011-03-11 MED ORDER — ACETAMINOPHEN 10 MG/ML IV SOLN
INTRAVENOUS | Status: DC | PRN
  Administered 2011-03-11: 1000 mg via INTRAVENOUS

## 2011-03-11 SURGICAL SUPPLY — 63 items
BAG SPEC THK2 15X12 ZIP CLS (MISCELLANEOUS) ×2
BAG ZIPLOCK 12X15 (MISCELLANEOUS) ×4 IMPLANT
BANDAGE ELASTIC 4 VELCRO ST LF (GAUZE/BANDAGES/DRESSINGS) ×2 IMPLANT
BANDAGE ELASTIC 6 VELCRO ST LF (GAUZE/BANDAGES/DRESSINGS) ×2 IMPLANT
BANDAGE ESMARK 6X9 LF (GAUZE/BANDAGES/DRESSINGS) ×1 IMPLANT
BANDAGE GAUZE ELAST BULKY 4 IN (GAUZE/BANDAGES/DRESSINGS) ×4 IMPLANT
BLADE SAG 18X100X1.27 (BLADE) ×2 IMPLANT
BLADE SAW SGTL 13.0X1.19X90.0M (BLADE) ×2 IMPLANT
BNDG CMPR 9X6 STRL LF SNTH (GAUZE/BANDAGES/DRESSINGS) ×1
BNDG ESMARK 6X9 LF (GAUZE/BANDAGES/DRESSINGS) ×2
CEMENT HV SMART SET (Cement) ×4 IMPLANT
CLOTH BEACON ORANGE TIMEOUT ST (SAFETY) ×2 IMPLANT
CUFF TOURN SGL QUICK 34 (TOURNIQUET CUFF) ×2
CUFF TRNQT CYL 34X4X40X1 (TOURNIQUET CUFF) ×1 IMPLANT
DRAPE EXTREMITY T 121X128X90 (DRAPE) ×2 IMPLANT
DRAPE LG THREE QUARTER DISP (DRAPES) ×2 IMPLANT
DRAPE POUCH INSTRU U-SHP 10X18 (DRAPES) ×2 IMPLANT
DRAPE U-SHAPE 47X51 STRL (DRAPES) ×2 IMPLANT
DRSG PAD ABDOMINAL 8X10 ST (GAUZE/BANDAGES/DRESSINGS) ×3 IMPLANT
DURAPREP 26ML APPLICATOR (WOUND CARE) ×2 IMPLANT
ELECT REM PT RETURN 9FT ADLT (ELECTROSURGICAL) ×2
ELECTRODE REM PT RTRN 9FT ADLT (ELECTROSURGICAL) ×1 IMPLANT
EVACUATOR 1/8 PVC DRAIN (DRAIN) ×2 IMPLANT
FACESHIELD LNG OPTICON STERILE (SAFETY) ×10 IMPLANT
GAUZE KERLIX 2  STERILE LF (GAUZE/BANDAGES/DRESSINGS) ×2 IMPLANT
GAUZE SPONGE 4X4 12PLY STRL LF (GAUZE/BANDAGES/DRESSINGS) ×1 IMPLANT
GAUZE XEROFORM 2X2 STRL (GAUZE/BANDAGES/DRESSINGS) ×2 IMPLANT
GLOVE ECLIPSE 8.0 STRL XLNG CF (GLOVE) ×2 IMPLANT
GLOVE SURG ORTHO 8.0 STRL STRW (GLOVE) ×2 IMPLANT
GLOVE SURG ORTHO 9.0 STRL STRW (GLOVE) ×2 IMPLANT
GOWN PREVENTION PLUS XLARGE (GOWN DISPOSABLE) ×6 IMPLANT
GOWN STRL NON-REIN LRG LVL3 (GOWN DISPOSABLE) ×2 IMPLANT
GOWN STRL REIN XL XLG (GOWN DISPOSABLE) ×2 IMPLANT
HANDPIECE INTERPULSE COAX TIP (DISPOSABLE) ×2
IMMOBILIZER KNEE 20 (SOFTGOODS)
IMMOBILIZER KNEE 20 THIGH 36 (SOFTGOODS) IMPLANT
KIT BASIN OR (CUSTOM PROCEDURE TRAY) ×2 IMPLANT
NS IRRIG 1000ML POUR BTL (IV SOLUTION) ×2 IMPLANT
PACK TOTAL JOINT (CUSTOM PROCEDURE TRAY) ×2 IMPLANT
POSITIONER SURGICAL ARM (MISCELLANEOUS) ×2 IMPLANT
SET HNDPC FAN SPRY TIP SCT (DISPOSABLE) ×1 IMPLANT
SET PAD KNEE POSITIONER (MISCELLANEOUS) ×2 IMPLANT
SPONGE GAUZE 4X4 12PLY (GAUZE/BANDAGES/DRESSINGS) ×2 IMPLANT
SPONGE LAP 18X18 X RAY DECT (DISPOSABLE) IMPLANT
SPONGE SURGIFOAM ABS GEL 100 (HEMOSTASIS) ×2 IMPLANT
STOCKINETTE 6  STRL (DRAPES) ×1
STOCKINETTE 6 STRL (DRAPES) ×1 IMPLANT
STRIP CLOSURE SKIN 1/2X4 (GAUZE/BANDAGES/DRESSINGS) ×4 IMPLANT
SUCTION FRAZIER 12FR DISP (SUCTIONS) ×2 IMPLANT
SUT BONE WAX W31G (SUTURE) ×2 IMPLANT
SUT MNCRL AB 3-0 PS2 18 (SUTURE) ×2 IMPLANT
SUT VIC AB 0 CT1 27 (SUTURE) ×4
SUT VIC AB 0 CT1 27XBRD ANTBC (SUTURE) ×2 IMPLANT
SUT VIC AB 1 CT1 27 (SUTURE) ×14
SUT VIC AB 1 CT1 27XBRD ANTBC (SUTURE) ×7 IMPLANT
SUT VIC AB 2-0 CT1 27 (SUTURE) ×4
SUT VIC AB 2-0 CT1 TAPERPNT 27 (SUTURE) ×2 IMPLANT
TAPE STRIPS DRAPE STRL (GAUZE/BANDAGES/DRESSINGS) ×2 IMPLANT
TOWEL OR 17X26 10 PK STRL BLUE (TOWEL DISPOSABLE) ×6 IMPLANT
TOWER CARTRIDGE SMART MIX (DISPOSABLE) ×2 IMPLANT
TRAY FOLEY CATH 14FRSI W/METER (CATHETERS) ×2 IMPLANT
WATER STERILE IRR 1500ML POUR (IV SOLUTION) ×2 IMPLANT
WRAP KNEE MAXI GEL POST OP (GAUZE/BANDAGES/DRESSINGS) ×4 IMPLANT

## 2011-03-11 NOTE — Progress Notes (Signed)
Notified Venita Lick, PA about patient's high BP and heart palpitations she experienced after receiving IV dilaudid 1 mg at 1815.  Will give Benazepril early tonight with 2000 meds and will give muscle relaxer now.  Okay to continue to monitor blood pressure.  RN will notify on call physician if BP continues to remain high.  Will avoid IV dilaudid for patient's comfort.  Barrie Lyme 1610 03/11/2011

## 2011-03-11 NOTE — Anesthesia Procedure Notes (Signed)
Spinal  Patient location during procedure: OR Reason for block: at surgeon's request Staffing Anesthesiologist: Azell Der Performed by: anesthesiologist  Preanesthetic Checklist Completed: patient identified, site marked, surgical consent, pre-op evaluation, timeout performed, IV checked, risks and benefits discussed and monitors and equipment checked Spinal Block Patient position: sitting Prep: Betadine Patient monitoring: heart rate, cardiac monitor, continuous pulse ox and blood pressure Approach: midline Location: L3-4 Injection technique: single-shot Needle Needle type: Sprotte  Needle gauge: 24 G

## 2011-03-11 NOTE — Op Note (Signed)
DATE OF SURGERY:  03/11/2011  TIME: 4:28 PM  PATIENT NAME:  Cathy Lewis    AGE: 66 y.o.   PRE-OPERATIVE DIAGNOSIS:  Osteoarthritis Right Knee  POST-OPERATIVE DIAGNOSIS:  Osteoarthritis Right Knee  PROCEDURE:  Procedure(s): TOTAL KNEE ARTHROPLASTY  SURGEON:  Carmella Kees ANDREW  ASSISTANT:  Oneida Alar, PA-C, present and scrubbed throughout the case, critical for assistance with exposure, retraction, instrumentation, and closure.  OPERATIVE IMPLANTS: Depuy PFC Sigma Rotating Platform.  Femur size 3, Tibia size 3, Patella size 35  3-peg oval button, with a 10 mm polyethylene insert.   PREOPERATIVE INDICATIONS:   Cathy Lewis is a 66 y.o. year old female with end stage bone on bone arthritis of the knee who failed conservative treatment and elected for Total Knee Arthroplasty.   The risks, benefits, and alternatives were discussed at length including but not limited to the risks of infection, bleeding, nerve injury, stiffness, blood clots, the need for revision surgery, cardiopulmonary complications, among others, and they were willing to proceed.  OPERATIVE DESCRIPTION:  The patient was brought to the operative room and placed in a supine position.  Spinal anesthesia was administered.  IV antibiotics were given.  The lower extremity was prepped and draped in the usual sterile fashion.  Time out was performed.  The leg was elevated and exsanguinated and the tourniquet was inflated.  Anterior quadriceps tendon splitting approach was performed.  The patella was retracted and osteophytes were removed.  The anterior horn of the medial and lateral meniscus was removed and cruciate ligaments resected.   The distal femur was opened with the drill and the intramedullary distal femoral cutting jig was utilized, set at 5 degrees resecting 10 mm off the distal femur.  Care was taken to protect the collateral ligaments.  The distal femoral sizing jig was applied, taking care to avoid  notching.  Then the 4-in-1 cutting jig was applied and the anterior and posterior femur was cut, along with the chamfer cuts.    Then the extramedullary tibial cutting jig was utilized making the appropriate cut using the anterior tibial crest as a reference building in appropriate posterior slope.  Care was taken during the cut to protect the medial and collateral ligaments.  The proximal tibia was removed along with the posterior horns of the menisci.   The posterior medial femoral osteophytes and posterior lateral femoral osteophytes were removed.    The flexion gap was then measured and was symmetric with the extension gap, measured at 10.  I completed the distal femoral preparation using the appropriate jig to prepare the box.  The patella was then measured, and cut with the saw.    The proximal tibia sized and prepared accordingly with the reamer and the punch, and then all components were trialed with the trial insert.  The knee was found to have excellent balance and full motion.    The above named components were then cemented into place and all excess cement was removed.  The trial polyethylene component was in place during cementation, and then was exchanged for the real polyethylene component.    The knee was easily taken through a range of motion and the patella tracked well and the knee irrigated copiously and the parapatellar and subcutaneous tissue closed with vicryl, and monocryl with steri strips for the skin.  The arthrotomy was closed at 90 of flexion. The wounds were dressed with sterile gauze and the tourniquet released and the patient was awakened and returned to the PACU  in stable and satisfactory condition.  There were no complications.  Total tourniquet time was  75 minutes.

## 2011-03-11 NOTE — Transfer of Care (Signed)
Immediate Anesthesia Transfer of Care Note  Patient: Cathy Lewis  Procedure(s) Performed:  TOTAL KNEE ARTHROPLASTY  Patient Location: PACU  Anesthesia Type: Spinal  Level of Consciousness: awake, alert  and oriented  Airway & Oxygen Therapy: Patient connected to nasal cannula oxygen  Post-op Assessment: Report given to PACU RN and Post -op Vital signs reviewed and stable  Post vital signs: Reviewed and stable Filed Vitals:   03/11/11 1310  BP: 155/79  Pulse: 95  Temp: 36.9 C  Resp: 18    Complications: No apparent anesthesia complications

## 2011-03-11 NOTE — Anesthesia Postprocedure Evaluation (Signed)
  Anesthesia Post-op Note  Patient: Cathy Lewis  Procedure(s) Performed:  TOTAL KNEE ARTHROPLASTY  Patient Location: PACU  Anesthesia Type: Spinal  Level of Consciousness: awake and alert   Airway and Oxygen Therapy: Patient Spontanous Breathing  Post-op Pain: mild  Post-op Assessment: Post-op Vital signs reviewed, Patient's Cardiovascular Status Stable, Respiratory Function Stable, Patent Airway and No signs of Nausea or vomiting  Post-op Vital Signs: stable  Complications: No apparent anesthesia complications

## 2011-03-11 NOTE — Anesthesia Preprocedure Evaluation (Signed)
Anesthesia Evaluation    Airway Mallampati: II TM Distance: >3 FB Neck ROM: Full    Dental No notable dental hx.    Pulmonary asthma ,  clear to auscultation  Pulmonary exam normal       Cardiovascular hypertension, Pt. on medications Regular Normal    Neuro/Psych    GI/Hepatic GERD-  Medicated,  Endo/Other    Renal/GU      Musculoskeletal   Abdominal   Peds  Hematology   Anesthesia Other Findings   Reproductive/Obstetrics                           Anesthesia Physical Anesthesia Plan  ASA: II  Anesthesia Plan: Spinal   Post-op Pain Management:    Induction: Intravenous  Airway Management Planned: Mask  Additional Equipment:   Intra-op Plan:   Post-operative Plan: Extubation in OR  Informed Consent: I have reviewed the patients History and Physical, chart, labs and discussed the procedure including the risks, benefits and alternatives for the proposed anesthesia with the patient or authorized representative who has indicated his/her understanding and acceptance.   Dental advisory given  Plan Discussed with: CRNA  Anesthesia Plan Comments: (Discussed risks/benefits of spinal including headache, backache, failure, bleeding, infection, and nerve damage. Patient consents to spinal. Questions answered. Coagulation studies and platelet count acceptable. )        Anesthesia Quick Evaluation

## 2011-03-11 NOTE — H&P (Signed)
NAME:  Cathy Lewis, Cathy Lewis                     ACCOUNT NO.:  MEDICAL RECORD NO.:  [id number]  LOCATION:                                 FACILITY:  PHYSICIAN:  Erasmo Leventhal, M.D.DATE OF BIRTH:  February 06, 1946  DATE OF ADMISSION:  03/11/2011 DATE OF DISCHARGE:                             HISTORY & PHYSICAL   ADMISSION DIAGNOSIS:  Painful loss of range of motion, right knee.  HISTORY OF PRESENT ILLNESS:  The patient is a 66 year old female who has been evaluated and treated by Dr. Thomasena Edis for issues related to her right knee.  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  The patient has elected to proceed with a total knee arthroplasty.  She is to review the findings with Dr. Thomasena Edis, the pros and cons.  The patient has been evaluated by her primary care physician and is cleared for this upcoming surgical procedure, so the patient would like to proceed.  X-rays reveal she has end-stage osteoarthritis of the knee with bone-on-bone medial compartment with significant patellofemoral degenerative changes.  ALLERGIES: 1. __________ causing aching joints and racing heart. 2. AVELOX causing chills and heart palpitations. 3. TRAMADOL causing dizziness and sickness to the stomach.  CURRENT MEDICATIONS: 1. Benazepril 20 mg once a day. 2. Singulair 10 mg once a day. 3. Crestor 10 mg once a day. 4. Advair 150/50 mcg 1 puff twice a day. 5. Baby aspirin once a day. 6. Meclizine p.r.n.  PRIMARY CARE PHYSICIAN:  Marjory Lies, MD  CARDIOLOGIST:  Peter M. Swazil[place], MD  UROLOGIST:  Maretta Bees. Vonita Moss, MD  PAST MEDICAL HISTORY: 1. Vertigo. 2. Asthma. 3. Hypertension. 4. Hypercholesterolemia. 5. Right knee osteoarthritis.  REVIEW OF SYSTEMS:  NEUROLOGIC:  Negative for any strokes, seizures, convulsions, migraines.  No issues related to anxiety or depression. PULMONARY:  She denies any history of shortness of breath, productive cough, or  wheezing.  Medications are able to control her asthma, she has never been hospitalized.  No tuberculosis, COPD, or sleep apnea. CARDIOVASCULAR:  She denies any chest pain, racing heart, skipping beats.  No signs of lightheadedness or dizziness due to irregular heart rhythms.  Past stress test was in 2000 and was normal.  GI:  She denies any acid reflux.  No ulcers.  No hepatitis, jaundice, cholecystitis, diverticular issues.  GU:  She denies any problems with urinary tract infections, kidney stones, or holding her urine.  ENDOCRINE:  She denies any thyroid issues or diabetic issues.  HEMATOLOGIC:  She denies any history of anemia, transfusions, blood clots.  PAST SURGICAL HISTORY:  Valve prolapse procedure, right foot surgery, right knee arthroscopy, hysterectomy, rectocele, right hand carpal tunnel syndrome, appendix, cystocele, D and C, and tubal ligation with no complications to anesthesia other than constipation.  FAMILY MEDICAL HISTORY:  Father is deceased from heart disease at the age of 89, mother from cancer at age 52.  Three sisters, all have had heart attacks, two with bypass and multiple stents.  SOCIAL HISTORY:  Patient is married.  She is a Medical sales representative at [employer].  She has smoked in the past, stopped about 20 years previous.  She lives in a one-storey one level home.  PHYSICAL EXAMINATION:  VITAL SIGNS:  Height is 5 feet 5 inches, weight 144 pounds, blood pressure 118/68, pulse 80 and regular, respirations are nonlabored at 14.  The patient is afebrile. GENERAL:  This is a healthy-appearing female, conscious, alert, appropriate, appears to be a good historian.  Easily gets in and out of the exam table on chair without any difficulty. NECK:  Supple.  No palpable lymphadenopathies. HEART:  Regular rate rhythm. CHEST:  Lung sounds clear throughout.  No wheezing. ABDOMEN:  Soft, bowel sounds present. EXTREMITIES:  Upper extremities have excellent range of  motion. Shoulders, elbows, and wrists with good motor strength.  Lower extremities, right knee, she is able to fully extend.  She can flex it back to 120.  She has no ligament instability.  Calf is soft and nontender.  Her left knee, she is able to fully extend and flex it back to 120.  Calf is soft and nontender.  No signs of phlebitis in either lower extremity.  Good pulses in the ankles. PERIPHERAL VASCULAR:  Carotid pulses are 2+.  No bruits.  Radial pulses are 2+.  Posterior tibial pulses are 1+.  She has no lower extremity edema or venous stasis changes. BREAST/RECTAL/GU:  Deferred.  IMPRESSION: 1. End-stage osteoarthritis of right knee with bone-on-bone medial     compartment with significant patellofemoral changes. 2. Hypertension. 3. Hypercholesterolemia. 4. History of vertigo. 5. History of asthma.  PLAN:  The patient has been evaluated and cleared by her primary care physician, Dr. Marjory Lies.  The patient will undergo all routine labs and tests prior to having a right total knee arthroplasty by Dr. Thomasena Edis at [facility] on March 11, 2011.     Jamelle Rushing, P.A.   ______________________________ Erasmo Leventhal, M.D.    RWK/MEDQ  D:  03/03/2011  T:  03/03/2011  Job:  [id number] I have seen and examined this patient.  Agree with the note above.  Kimesha Claxton ANDREW 03/11/2011 2:41 PM

## 2011-03-11 NOTE — Preoperative (Signed)
Beta Blockers   Reason not to administer Beta Blockers:Not Applicable 

## 2011-03-12 LAB — CBC
MCH: 28.8 pg (ref 26.0–34.0)
MCV: 90 fL (ref 78.0–100.0)
Platelets: 195 10*3/uL (ref 150–400)
RDW: 13.6 % (ref 11.5–15.5)

## 2011-03-12 LAB — BASIC METABOLIC PANEL
Calcium: 8.3 mg/dL — ABNORMAL LOW (ref 8.4–10.5)
Creatinine, Ser: 0.95 mg/dL (ref 0.50–1.10)
GFR calc Af Amer: 70 mL/min — ABNORMAL LOW (ref >90)

## 2011-03-12 MED ORDER — OXYCODONE HCL 5 MG PO TABS
5.0000 mg | ORAL_TABLET | ORAL | Status: DC | PRN
  Administered 2011-03-12: 10 mg via ORAL
  Administered 2011-03-12 – 2011-03-14 (×6): 5 mg via ORAL
  Filled 2011-03-12 (×3): qty 1
  Filled 2011-03-12: qty 2
  Filled 2011-03-12: qty 1
  Filled 2011-03-12: qty 2
  Filled 2011-03-12 (×3): qty 1

## 2011-03-12 NOTE — Progress Notes (Signed)
Physical Therapy Treatment Patient Details Name: PRESLEA RHODUS MRN: [id number] DOB: 1945-07-12 Today's Date: 03/12/2011  [id number] 2G  PT Assessment/Plan  PT - Assessment/Plan Comments on Treatment Session: Pt able to tolerate ambulation during pm session, however had c/o of increased pain upon returning to room.  Mobility has also improved since am session.  PT recommends HHPT for follow up therapy at D/C.  PT Plan: Discharge plan remains appropriate PT Frequency: 7X/week Follow Up Recommendations: Home health PT Equipment Recommended: Rolling walker with 5" wheels PT Goals  Acute Rehab PT Goals PT Goal Formulation: With patient Time For Goal Achievement: 7 days Pt will go Supine/Side to Sit: with min assist PT Goal: Supine/Side to Sit - Progress: Progressing toward goal Pt will go Sit to Stand: with min assist PT Goal: Sit to Stand - Progress: Progressing toward goal Pt will go Stand to Sit: with supervision PT Goal: Stand to Sit - Progress: Updated due to goals met Pt will Ambulate: 51 - 150 feet;with supervision;with least restrictive assistive device PT Goal: Ambulate - Progress: Updated due to goal met  PT Treatment Precautions/Restrictions  Precautions Precautions: Knee Required Braces or Orthoses: Yes Knee Immobilizer: Discontinue once straight leg raise with < 10 degree lag Restrictions Weight Bearing Restrictions: Yes RLE Weight Bearing: Weight bearing as tolerated Mobility (including Balance) Bed Mobility Bed Mobility: Yes Supine to Sit: 1: +2 Total assist;Patient percentage (comment);HOB elevated (Comment degrees) (Pt assist approx 45%) Supine to Sit Details (indicate cue type and reason): Requires +2 assist for B LE, and minor trunk assist to attain upright position.  Cues for hand placement to assist trunk. Transfers Transfers: Yes Sit to Stand: 3: Mod assist;With upper extremity assist;From bed Sit to Stand Details (indicate cue type and reason): requires  assist to elevate trunk with cues for R LE management and hand placement on bed.   Stand to Sit: 4: Min assist;With upper extremity assist;With armrests;To chair/3-in-1 Stand to Sit Details: Requires cues for hand placement and RLE management.  Ambulation/Gait Ambulation/Gait: Yes Ambulation/Gait Assistance: 4: Min assist Ambulation/Gait Assistance Details (indicate cue type and reason): Requires min assist for steadying with cues for upright posture, sequencing/technique with RW.  Requires some tactile cuing for RW placement.  Ambulation Distance (Feet): 85 Feet Assistive device: Rolling walker Gait Pattern: Step-to pattern;Trunk flexed Gait velocity: decreased  Posture/Postural Control Posture/Postural Control: No significant limitations Exercise    End of Session PT - End of Session Equipment Utilized During Treatment: Right knee immobilizer Activity Tolerance: Patient limited by pain Patient left: in chair;with call bell in reach Nurse Communication: Mobility status for transfers;Mobility status for ambulation General Behavior During Session: [facility] for tasks performed Cognition: [facility] for tasks performed  Page, Meribeth Mattes 03/12/2011, 3:42 PM

## 2011-03-12 NOTE — Progress Notes (Signed)
Physical Therapy Evaluation Patient Details Name: Cathy Lewis MRN: [id number] DOB: 1945-06-12 Today's Date: 03/12/2011  [phone] EVII   Problem List:  Patient Active Problem List  Diagnoses  . Atypical chest pain  . Dyslipidemia  . Hypertension  . Syncope    Past Medical History:  Past Medical History  Diagnosis Date  . Atypical chest pain     NORMAL CARDIOLITE STUDY   . Dyslipidemia   . Hypertension   . Syncope     HISTORY OF  . Asthma   . Seasonal allergies   . GERD (gastroesophageal reflux disease)   . Arthritis     arthritis in knees and back    Past Surgical History:  Past Surgical History  Procedure Date  . Abdominal hysterectomy   . Removed rectal cell   . Cystocell   . Appendectomy   . Carpal tunnel release     R  . Other surgical history     bladder vault prolapse  . Other surgical history     right foot bunion and hammer toe surgery   . Other surgical history     right knee arthroscopic   . Other surgical history     right arm ( underarm ) surgery removed ? cyst   . Dilation and curettage of uterus   . Tubal ligation     1977    PT Assessment/Plan/Recommendation PT Assessment Clinical Impression Statement: Pt presents s/p R TKA with increased nausea/fatigue during am session.  RN states that she will be switched to pill medication for improved effects.  +2 assist for bed mobility this am.  Will check on in pm to determine ambulation/D/C status.  SNF vs HHPT depending on pm status.   PT Recommendation/Assessment: Patient will need skilled PT in the acute care venue PT Problem List: Decreased strength;Decreased range of motion;Decreased activity tolerance;Decreased mobility;Decreased knowledge of use of DME;Pain Barriers to Discharge: None PT Therapy Diagnosis : Difficulty walking;Acute pain;Abnormality of gait;Generalized weakness PT Plan PT Frequency: 7X/week PT Treatment/Interventions: DME instruction;Gait training;Stair training;Functional  mobility training;Therapeutic activities;Therapeutic exercise;Patient/family education PT Recommendation Follow Up Recommendations: Home health PT;Skilled nursing facility Equipment Recommended: Rolling walker with 5" wheels PT Goals  Acute Rehab PT Goals PT Goal Formulation: With patient Time For Goal Achievement: 7 days Pt will go Supine/Side to Sit: with min assist PT Goal: Supine/Side to Sit - Progress: Goal set today Pt will go Sit to Stand: with min assist PT Goal: Sit to Stand - Progress: Goal set today Pt will go Stand to Sit: with min assist PT Goal: Stand to Sit - Progress: Goal set today Pt will Ambulate: 51 - 150 feet;with min assist;with least restrictive assistive device PT Goal: Ambulate - Progress: Goal set today Pt will Go Up / Down Stairs: 6-9 stairs;with mod assist;with least restrictive assistive device;with rail(s) PT Goal: Up/Down Stairs - Progress: Goal set today Pt will Perform Home Exercise Program: with supervision, verbal cues required/provided PT Goal: Perform Home Exercise Program - Progress: Goal set today  PT Evaluation Precautions/Restrictions  Precautions Precautions: Knee Required Braces or Orthoses: Yes Knee Immobilizer: Discontinue once straight leg raise with < 10 degree lag Restrictions Weight Bearing Restrictions: Yes RLE Weight Bearing: Weight bearing as tolerated Prior Functioning  Home Living Lives With: Spouse Receives Help From: Family Type of Home: House Home Layout: One level Home Access: Stairs to enter Entrance Stairs-Rails: Right Entrance Stairs-Number of Steps: 6 Home Adaptive Equipment: Crutches;Bedside commode/3-in-1 (has stool she can put in shower) Prior  Function Level of Independence: Independent with basic ADLs;Independent with homemaking with ambulation;Independent with gait;Independent with transfers Driving: Yes Vocation: Part time employment Cognition Cognition Arousal/Alertness: Awake/alert Overall Cognitive  Status: Appears within functional limits for tasks assessed Orientation Level: Oriented X4 Sensation/Coordination Sensation Light Touch: Appears Intact Coordination Gross Motor Movements are Fluid and Coordinated: Yes Extremity Assessment RLE Assessment RLE Assessment: Exceptions to [facility] RLE Strength RLE Overall Strength Comments: Pt demonstrates good ankle motion, unable to perform SLR and presents with increased pain with movement.  LLE Assessment LLE Assessment: Within Functional Limits Mobility (including Balance) Bed Mobility Bed Mobility: Yes Supine to Sit: 1: +2 Total assist Supine to Sit Details (indicate cue type and reason): Pt assist 45%.  Requires assist with B LE and some assist with trunk due to increased pain with movement.  Cues provided for hand placement on  bed to assist with elevation of trunk.   Sit to Supine: 1: +2 Total assist Sit to Supine - Details (indicate cue type and reason): Pt assist 45%. Requires assist with B LE into bed and assist with trunk for controlled descent.   Scooting to [facility]: 4: Min assist Scooting to [facility] Details (indicate cue type and reason): Cues given for single leg bridge technique with assist for RLE.   Transfers Transfers:  (Did not stand during am session due to increased fatigue/nau) Ambulation/Gait Ambulation/Gait:  (no ambulation due to increased fatigue/nausea)  Posture/Postural Control Posture/Postural Control: No significant limitations Balance Balance Assessed: Yes Static Sitting Balance Static Sitting - Balance Support: Bilateral upper extremity supported Static Sitting - Level of Assistance: 5: Stand by assistance Static Sitting - Comment/# of Minutes: Able to tolerate EOB sitting for approx 6 mins with B UE support with intermittent single UE support.  Only sat EOB due to increased nausea/fatigue.   Exercise    End of Session PT - End of Session Equipment Utilized During Treatment: Right knee immobilizer Activity  Tolerance: Patient limited by fatigue;Patient limited by pain Patient left: in bed;with call bell in reach Nurse Communication: Mobility status for transfers (RN notified of nausea/fatigue) General Behavior During Session: Lethargic Cognition: WFL for tasks performed  Page, Meribeth Mattes 03/12/2011, 10:56 AM

## 2011-03-12 NOTE — Progress Notes (Signed)
  CARE MANAGEMENT NOTE 03/12/2011  Patient:  Cathy Lewis, Cathy Lewis   Account Number:  [id number]  Date Initiated:  03/12/2011  Documentation initiated by:  Colleen Can  Subjective/Objective Assessment:   dx osteoarthritis knee-right; total knee replacemnt     Action/Plan:   patient is currently sleeping. No one else in room. CM will follow   Anticipated DC Date:  03/14/2011   Anticipated DC Plan:  HOME W HOME HEALTH SERVICES      DC Planning Services  CM consult      Choice offered to / List presented to:             Status of service:  In process, will continue to follow

## 2011-03-12 NOTE — Progress Notes (Signed)
CSW consulted for SNF placement. PT recommendations reviewed. PT is recommending HHPT. CSW is available to assist if plan changes and SNF placement is required.

## 2011-03-12 NOTE — Progress Notes (Signed)
Subjective: Patient's only complaint this morning his pain. She states that an initial pain medicine caused chest pressure and her blood pressure to raise she was changed to morphine which works better. She has no problems with the pills. She denies any shortness of breath chest pressure lightheadedness dizziness or nausea at this time  Objective: Vital signs in last 24 hours: Temp:  [97.4 F (36.3 C)-98.5 F (36.9 C)] 98.5 F (36.9 C) (01/16 0500) Pulse Rate:  [61-95] 72  (01/16 0500) Resp:  [10-20] 16  (01/16 0500) BP: (116-181)/(61-89) 137/65 mmHg (01/16 0500) SpO2:  [93 %-100 %] 100 % (01/16 0500) Weight:  [65.318 kg (144 lb)] 65.318 kg (144 lb) (01/15 1846)  Intake/Output from previous day: 01/15 0701 - 01/16 0700 In: 2090 [P.O.:240; I.V.:1850] Out: 1940 [Urine:1575; Drains:340; Blood:25] Intake/Output this shift:     Basename 03/12/11 0433  HGB 10.9*    Basename 03/12/11 0433  WBC 12.1*  RBC 3.79*  HCT 34.1*  PLT 195    Basename 03/12/11 0433  NA 134*  K 4.0  CL 101  CO2 24  BUN 10  CREATININE 0.95  GLUCOSE 128*  CALCIUM 8.3*   No results found for this basename: LABPT:2,INR:2 in the last 72 hours  Patient appears to be home with home in no distress lying in a hospital bed and. Lung sounds clear heart regular rate and rhythm abdomen bowel sounds present and soft. Hemovac DC'd intact calf and thigh is soft and nontender foot neuromotor vascularly intact  Assessment/Plan: Post op day #1 right total knee arthroplasty stable Mild hyponatremia asymptomatic will decrease IV hydration KVO IV line Increased pain will adjust pain medicines Out of bed with physical therapy CPM possible home Friday   Thora Scherman W 03/12/2011, 7:18 AM

## 2011-03-13 LAB — BASIC METABOLIC PANEL
Chloride: 100 mEq/L (ref 96–112)
Creatinine, Ser: 0.95 mg/dL (ref 0.50–1.10)
GFR calc Af Amer: 70 mL/min — ABNORMAL LOW (ref >90)
GFR calc non Af Amer: 61 mL/min — ABNORMAL LOW (ref >90)
Potassium: 3.7 mEq/L (ref 3.5–5.1)

## 2011-03-13 LAB — CBC
MCV: 88.4 fL (ref 78.0–100.0)
Platelets: 170 10*3/uL (ref 150–400)
RDW: 13.8 % (ref 11.5–15.5)
WBC: 10.4 10*3/uL (ref 4.0–10.5)

## 2011-03-13 MED ORDER — SODIUM CHLORIDE 0.9 % IV BOLUS (SEPSIS)
500.0000 mL | Freq: Once | INTRAVENOUS | Status: AC
  Administered 2011-03-13: 500 mL via INTRAVENOUS

## 2011-03-13 MED ORDER — SODIUM CHLORIDE 0.9 % IV SOLN: INTRAVENOUS | Status: DC

## 2011-03-13 MED ORDER — SODIUM CHLORIDE 0.9 % IV SOLN
INTRAVENOUS | Status: DC
  Administered 2011-03-13: 11:00:00 via INTRAVENOUS
  Filled 2011-03-13 (×2): qty 1000

## 2011-03-13 NOTE — Progress Notes (Signed)
Subjective: Patient feels a little bit better today still sore in her right knee no shortness of breath chest pain nausea or vomiting   Objective: Vital signs in last 24 hours: Temp:  [98.5 F (36.9 C)-99.9 F (37.7 C)] 99.3 F (37.4 C) (01/16 2156) Pulse Rate:  [73-97] 97  (01/16 2156) Resp:  [16-18] 16  (01/16 2156) BP: (118-137)/(69-71) 121/69 mmHg (01/16 2156) SpO2:  [90 %-97 %] 96 % (01/16 2210)  Intake/Output from previous day: 01/16 0701 - 01/17 0700 In: 1622.6 [P.O.:120; I.V.:1502.6] Out: 850 [Urine:850] Intake/Output this shift: Total I/O In: 100 [I.V.:100] Out: 700 [Urine:700]   Basename 03/13/11 0431 03/12/11 0433  HGB 10.5* 10.9*    Basename 03/13/11 0431 03/12/11 0433  WBC 10.4 12.1*  RBC 3.61* 3.79*  HCT 31.9* 34.1*  PLT 170 195    Basename 03/13/11 0431 03/12/11 0433  NA 132* 134*  K 3.7 4.0  CL 100 101  CO2 24 24  BUN 9 10  CREATININE 0.95 0.95  GLUCOSE 132* 128*  CALCIUM 8.6 8.3*   No results found for this basename: LABPT:2,INR:2 in the last 72 hours  Patient's conscious alert appropriate appears to be in no distress laying in a hospital bed. Her right leg dressing was taken down the wound is well approximated with Steri-Strips. There is no blisters no drainage no erythema minimal swelling. Her thigh and calf were soft and nontender her leg was neuromotor vascularly intact.  Assessment/Plan: Postop day #2 right total knee arthroplasty doing well ambulating 85 feet with therapy CPM 0-40 Mild hyponatremia asymptomatic we'll allow to self correct with DC'd IV hydration Improved pain control Plan out of bed with physical therapy today Hep-Lock IV catheter continue with CPM probable home this Friday with home health physical therapy Lovenox and 2 week followup evaluation with Dr. Thomasena Edis.   Cathy Lewis 03/13/2011, 6:31 AM

## 2011-03-13 NOTE — Progress Notes (Signed)
CARE MANAGEMENT NOTE 03/13/2011  Patient:  Cathy Lewis, Cathy Lewis   Account Number:  [id number]  Date Initiated:  03/12/2011  Documentation initiated by:  Colleen Can  Subjective/Objective Assessment:   dx osteoarthritis knee-right; total knee replacemnt     Action/Plan:   patient is currently sleeping. No one else in room. CM will follow   Anticipated DC Date:  03/14/2011   Anticipated DC Plan:  HOME W HOME HEALTH SERVICES      DC Planning Services  CM consult      [facility] Choice  HOME HEALTH   Choice offered to / List presented to:  C-1 Patient   DME arranged  NA      DME agency  NA     HH arranged  HH-2 PT      [facility] agency  [facility]   Status of service:  In process, will continue to follow Medicare Important Message given?   (If response is "NO", the following Medicare IM given date fields will be blank) Date Medicare IM given:   Date Additional Medicare IM given:    Discharge Disposition:    Per UR Regulation:    Comments:  03/13/2011 Raynelle Bring BSN CCM [phone] CM spoke with patient. Plans are for patient to return to [facility] where her spouse will be caregiver. States wants [place] for hh services. Choice list placed on chart. Already has bsc but will need RW. Will be getting CPM after she goes home. [employer] will deliver and has already gotten in contact with patient. Genevieve Norlander will arrange for RW

## 2011-03-13 NOTE — Progress Notes (Signed)
CSW consulted for D/C planning. PN reviewed. RNCM is assisting with [facility] services for planned d/c home. CSW available if plan changes and SNF is required.

## 2011-03-13 NOTE — Progress Notes (Signed)
Physical Therapy Treatment Patient Details Name: Cathy Lewis MRN: [id number] DOB: [birth year] Today's Date: 03/13/2011  [phone] TE, TA  PT Assessment/Plan  PT - Assessment/Plan Comments on Treatment Session: Pt unable to tolerate ambulation due to increase pain and decrease in blood pressure.  Blood pressure prior to activity: 99/63, following standing 90/55 and following 3in1 to chair transfer 106/68.  Co-treatment with OT during am session. PT Plan: Discharge plan remains appropriate PT Frequency: 7X/week Follow Up Recommendations: Home health PT Equipment Recommended: Rolling walker with 5" wheels PT Goals  Acute Rehab PT Goals PT Goal Formulation: With patient Time For Goal Achievement: 7 days Pt will go Supine/Side to Sit: with min assist PT Goal: Supine/Side to Sit - Progress: Progressing toward goal Pt will go Sit to Stand: with supervision PT Goal: Sit to Stand - Progress: Updated due to goal met Pt will go Stand to Sit: with supervision PT Goal: Stand to Sit - Progress: Progressing toward goal Pt will Ambulate: 51 - 150 feet;with supervision;with least restrictive assistive device PT Goal: Ambulate - Progress: Not progressing (due to blood pressure issues) Pt will Perform Home Exercise Program: with supervision, verbal cues required/provided PT Goal: Perform Home Exercise Program - Progress: Progressing toward goal  PT Treatment Precautions/Restrictions  Precautions Precautions: Knee Required Braces or Orthoses: Yes Knee Immobilizer: Discontinue once straight leg raise with < 10 degree lag Restrictions Weight Bearing Restrictions: Yes RLE Weight Bearing: Weight bearing as tolerated Mobility (including Balance) Bed Mobility Bed Mobility: Yes Supine to Sit: 1: +2 Total assist;Patient percentage (comment);With rails (Pt assist approx 60%) Supine to Sit Details (indicate cue type and reason): Requires assist for RLE and some assist with trunk.  Cues for hand  placement to assist trunk.  Sit to Supine: 3: Mod assist;With rail;HOB elevated (comment degrees) Sit to Supine - Details (indicate cue type and reason): VCs for hand placement. Physical A needed for BLEs and trunk. Transfers Transfers: Yes Sit to Stand: 4: Min assist;With upper extremity assist;From bed;From chair/3-in-1 Sit to Stand Details (indicate cue type and reason): Requires cues for hand placement and LE position.   Stand to Sit: 4: Min assist;With upper extremity assist;With armrests;To chair/3-in-1 Stand to Sit Details: Cues for hand placement and RLE management.  Ambulation/Gait Ambulation/Gait: Yes Ambulation/Gait Assistance: 4: Min assist Ambulation/Gait Assistance Details (indicate cue type and reason): Took small steps from 3in1 to chair, did not ambulate further due to decrease in blood pressure in upright position.  Ambulation Distance (Feet): 5 Feet Assistive device: Rolling walker Gait Pattern: Step-to pattern;Trunk flexed Gait velocity: decreased  Posture/Postural Control Posture/Postural Control: No significant limitations Exercise  Total Joint Exercises Ankle Circles/Pumps: AROM;Both;20 reps Quad Sets: AROM;Right;10 reps;Seated Short Arc Quad: AAROM;Right;10 reps;Seated Heel Slides: AAROM;Right;10 reps;Seated Hip ABduction/ADduction: AAROM;Right;10 reps;Seated Straight Leg Raises: AAROM;Right;10 reps;Seated End of Session PT - End of Session Equipment Utilized During Treatment: Right knee immobilizer Activity Tolerance: Patient limited by pain (Pt limited by drop in blood pressure) Patient left: in chair;with call bell in reach Nurse Communication: Mobility status for transfers;Mobility status for ambulation General Behavior During Session: [facility] for tasks performed Cognition: [facility] for tasks performed  Page, Meribeth Mattes 03/13/2011, 11:33 AM

## 2011-03-13 NOTE — Progress Notes (Signed)
Physical Therapy Treatment Patient Details Name: WENDEE HATA MRN: [id number] DOB: [birth year] Today's Date: 03/13/2011  [id number] G  PT Assessment/Plan  PT - Assessment/Plan Comments on Treatment Session: Pt tolerated increased ambulation distance during pm session.  Pt with no c/o of dizziness/lightheadedness during session.  BP stayed stable throughout session.  Will practice stair training during tomorrows session for possible D/C.   PT Plan: Discharge plan remains appropriate PT Frequency: 7X/week Follow Up Recommendations: Home health PT Equipment Recommended: Rolling walker with 5" wheels PT Goals  Acute Rehab PT Goals PT Goal Formulation: With patient Time For Goal Achievement: 7 days Pt will go Supine/Side to Sit: with min assist PT Goal: Supine/Side to Sit - Progress: Progressing toward goal Pt will go Sit to Stand: with supervision PT Goal: Sit to Stand - Progress: Progressing toward goal Pt will go Stand to Sit: with supervision PT Goal: Stand to Sit - Progress: Progressing toward goal Pt will Ambulate: 51 - 150 feet;with supervision;with least restrictive assistive device PT Goal: Ambulate - Progress: Progressing toward goal  PT Treatment Precautions/Restrictions  Precautions Precautions: Knee Required Braces or Orthoses: Yes Knee Immobilizer: Discontinue once straight leg raise with < 10 degree lag Restrictions Weight Bearing Restrictions: Yes RLE Weight Bearing: Weight bearing as tolerated Mobility (including Balance) Bed Mobility Bed Mobility: Yes Supine to Sit: 1: +2 Total assist;Patient percentage (comment) (Pt assist 75%) Supine to Sit Details (indicate cue type and reason): +2 assist for LE management with minor assist for trunk for safety.  Cues for hand placement to assist trunk Sit to Supine: 3: Mod assist Sit to Supine - Details (indicate cue type and reason): Requires assist for B LE into bed, pt able to have controlle descent of  trunk Transfers Transfers: Yes Sit to Stand: 4: Min assist;With upper extremity assist;From bed Sit to Stand Details (indicate cue type and reason): Cues for hand placement on bed, managment of RLE Stand to Sit: 4: Min assist;With upper extremity assist;To bed Stand to Sit Details: Requires cues for hand placement and RLE Ambulation/Gait Ambulation/Gait: Yes Ambulation/Gait Assistance: 4: Min assist Ambulation/Gait Assistance Details (indicate cue type and reason): Requires cues for sequencing/technique, relaxed posture.  Ambulation Distance (Feet): 150 Feet Assistive device: Rolling walker Gait Pattern: Step-to pattern;Trunk flexed Gait velocity: decreased    Exercise    End of Session PT - End of Session Equipment Utilized During Treatment: Right knee immobilizer Activity Tolerance: Patient limited by pain Patient left: in bed;with call bell in reach General Behavior During Session: [facility] for tasks performed Cognition: [facility] for tasks performed  Page, Meribeth Mattes 03/13/2011, 3:59 PM

## 2011-03-13 NOTE — Progress Notes (Signed)
Occupational Therapy Evaluation Patient Details Name: Cathy Lewis MRN: [id number] DOB: April 28, 1945 Today's Date: 03/13/2011 EV2 [phone] Problem List:  Patient Active Problem List  Diagnoses  . Atypical chest pain  . Dyslipidemia  . Hypertension  . Syncope    Past Medical History:  Past Medical History  Diagnosis Date  . Atypical chest pain     NORMAL CARDIOLITE STUDY   . Dyslipidemia   . Hypertension   . Syncope     HISTORY OF  . Asthma   . Seasonal allergies   . GERD (gastroesophageal reflux disease)   . Arthritis     arthritis in knees and back    Past Surgical History:  Past Surgical History  Procedure Date  . Abdominal hysterectomy   . Removed rectal cell   . Cystocell   . Appendectomy   . Carpal tunnel release     R  . Other surgical history     bladder vault prolapse  . Other surgical history     right foot bunion and hammer toe surgery   . Other surgical history     right knee arthroscopic   . Other surgical history     right arm ( underarm ) surgery removed ? cyst   . Dilation and curettage of uterus   . Tubal ligation     1977    OT Assessment/Plan/Recommendation OT Assessment Clinical Impression Statement: Pt doing well despite dizziness and low BP. Skilled OT recommended to maximize I w/BADLs to supervision level for safe d/c home w/prn A from family. OT Recommendation/Assessment: Patient will need skilled OT in the acute care venue OT Problem List: Decreased activity tolerance;Decreased safety awareness;Decreased knowledge of use of DME or AE;Cardiopulmonary status limiting activity OT Therapy Diagnosis : Generalized weakness OT Plan OT Frequency: Min 2X/week OT Treatment/Interventions: Self-care/ADL training;DME and/or AE instruction;Therapeutic activities;Patient/family education OT Recommendation Follow Up Recommendations: No OT follow up Equipment Recommended: Rolling walker with 5" wheels (possibly tub transfer bench. TBA) Individuals  Consulted Consulted and Agree with Results and Recommendations: Patient OT Goals Acute Rehab OT Goals OT Goal Formulation: With patient Time For Goal Achievement: 2 days ADL Goals Pt Will Transfer to Toilet: 3-in-1;Ambulation;with supervision ADL Goal: Toilet Transfer - Progress: Goal set today Pt Will Perform Toileting - Clothing Manipulation: with supervision;Standing ADL Goal: Toileting - Clothing Manipulation - Progress: Goal set today Pt Will Perform Toileting - Hygiene: with supervision;Sit to stand from 3-in-1/toilet ADL Goal: Toileting - Hygiene - Progress: Goal set today Pt Will Perform Tub/Shower Transfer: Tub transfer;with supervision;Other (comment);with DME (Equip needs TBA) ADL Goal: Tub/Shower Transfer - Progress: Goal set today  OT Evaluation Precautions/Restrictions  Precautions Precautions: Knee Required Braces or Orthoses: Yes Knee Immobilizer: Discontinue once straight leg raise with < 10 degree lag Restrictions Weight Bearing Restrictions: Yes RLE Weight Bearing: Weight bearing as tolerated Prior Functioning Home Living Lives With: Spouse Receives Help From: Family Type of Home: House Home Layout: One level Home Access: Stairs to enter Entrance Stairs-Rails: Right Entrance Stairs-Number of Steps: 6 Bathroom Shower/Tub: Engineer, manufacturing systems: Handicapped height Bathroom Accessibility: Yes How Accessible: Accessible via walker Home Adaptive Equipment: Shower chair without back;Bedside commode/3-in-1 Prior Function Level of Independence: Independent with basic ADLs;Independent with transfers;Needs assistance with gait;Independent with homemaking with ambulation Driving: Yes Vocation: Part time employment ADL ADL Grooming: Simulated;Set up Where Assessed - Grooming: Sitting, bed;Unsupported Upper Body Bathing: Simulated;Set up Where Assessed - Upper Body Bathing: Sitting, bed;Unsupported Lower Body Bathing: Simulated;Minimal  assistance Where Assessed -  Lower Body Bathing: Sit to stand from bed Upper Body Dressing: Performed;Set up Where Assessed - Upper Body Dressing: Sitting, bed;Unsupported Lower Body Dressing: Simulated;Minimal assistance Where Assessed - Lower Body Dressing: Sit to stand from bed Toilet Transfer: Performed;Minimal assistance Toilet Transfer Method: Stand pivot Toilet Transfer Equipment: Bedside commode Toileting - Clothing Manipulation: Performed;Minimal assistance Where Assessed - Toileting Clothing Manipulation: Sit on 3-in-1 or toilet Toileting - Hygiene: Performed;Minimal assistance Where Assessed - Toileting Hygiene: Sit to stand from 3-in-1 or toilet Tub/Shower Transfer: Not assessed Tub/Shower Transfer Method: Not assessed Ambulation Related to ADLs: Pt limited by lightheadedness. BP 90/55. Transfer to [facility] then to chair. Vision/Perception  Vision - History Baseline Vision: Wears glasses only for reading Patient Visual Report: No change from baseline Vision - Assessment Vision Assessment: Vision not tested Cognition Cognition Arousal/Alertness: Awake/alert Overall Cognitive Status: Appears within functional limits for tasks assessed Orientation Level: Oriented X4 Sensation/Coordination   Extremity Assessment RUE Assessment RUE Assessment: Within Functional Limits LUE Assessment LUE Assessment: Within Functional Limits Mobility  Bed Mobility Bed Mobility: Yes Sit to Supine: 3: Mod assist;With rail;HOB elevated (comment degrees) Sit to Supine - Details (indicate cue type and reason): VCs for hand placement. Physical A needed for BLEs and trunk. Transfers Transfers: Yes Sit to Stand: 4: Min assist;With upper extremity assist;From bed;From chair/3-in-1 Sit to Stand Details (indicate cue type and reason): VCs for hand placement, LE position Stand to Sit: 4: Min assist;With upper extremity assist;With armrests;To chair/3-in-1 Stand to Sit Details: VCs for hand placement,  LE position Exercises   End of Session OT - End of Session Equipment Utilized During Treatment: Other (comment) (RW, 3:1) Activity Tolerance: Treatment limited secondary to medical complications (Comment) (low BP) Patient left: in chair;with call bell in reach General Behavior During Session: [facility] for tasks performed Cognition: [facility] for tasks performed   Ramon Zanders A, OTR/L [phone] 03/13/2011, 10:54 AM

## 2011-03-14 ENCOUNTER — Encounter (HOSPITAL_COMMUNITY): Payer: Self-pay | Admitting: Specialist

## 2011-03-14 LAB — CBC
MCV: 88.5 fL (ref 78.0–100.0)
Platelets: 157 10*3/uL (ref 150–400)
RBC: 3.13 MIL/uL — ABNORMAL LOW (ref 3.87–5.11)
RDW: 14.1 % (ref 11.5–15.5)
WBC: 8.7 10*3/uL (ref 4.0–10.5)

## 2011-03-14 MED ORDER — FERROUS SULFATE 325 (65 FE) MG PO TABS: 325.0000 mg | ORAL_TABLET | Freq: Three times a day (TID) | ORAL | Status: DC

## 2011-03-14 MED ORDER — METHOCARBAMOL 500 MG PO TABS: 500.0000 mg | ORAL_TABLET | Freq: Four times a day (QID) | ORAL | Status: AC | PRN

## 2011-03-14 MED ORDER — OXYCODONE HCL 5 MG PO TABS: 5.0000 mg | ORAL_TABLET | ORAL | Status: AC | PRN

## 2011-03-14 MED ORDER — ENOXAPARIN SODIUM 30 MG/0.3ML ~~LOC~~ SOLN: 30.0000 mg | Freq: Two times a day (BID) | SUBCUTANEOUS | Status: DC

## 2011-03-14 NOTE — Discharge Summary (Signed)
Physician Discharge Summary  Patient ID: Cathy Lewis MRN: [id number] DOB/AGE: 1945/06/22 66 y.o.  Admit date: 03/11/2011 Discharge date: 03/14/2011  Admission Diagnoses: End-stage osteoarthritis right knee Hypertension Dyslipidemia History of atypical chest History of syncope  Discharge Diagnoses: Status post right total knee arthroplasty Asymptomatic postoperative blood loss anemia allow up to self correct with iron supplementation Mild hyponatremia asymptomatic we will allow up to self correct History of hypertension History of dyslipidemia History of atypical chest pains History of syncope. Patient had 1 vasovagal event while on the commode recovered quickly without any untoward events.    Discharged Condition: good  Hospital Course: Patient was admitted to [facility] under the care of Dr. Valma Cava was taken to the OR were a right total knee arthroplasty was performed without complications patient was transferred to recovery room in orthopedic for good condition to follow total knee protocol with IV antibiotics Lovenox for DVT prophylaxis and IV pain medicines. Patient then went 3 postoperative days in which the only untoward event was once syncopal episode while on the commode patient recovered quickly without any untoward events or sequela continued to progress with physical therapy without any issues. She did well with her with physical therapy. Hemoglobin did drop to 10.5 but her vital signs remained stable. She also developed some mild hyponatremia without any sign of symptoms. Patient participated with physical therapy on a daily basis without any issues. She tolerated the CPM well. She was able to get comfortable on by mouth pain medicine. Postop day #3 she was ready for discharge home for home health physical therapy  Consults: none  Significant Diagnostic Studies: labs: Routine postoperative labs  Treatments: surgery: Right total knee  arthroplasty  Discharge Exam: Blood pressure 126/76, pulse 98, temperature 98.7 F (37.1 C), temperature source Oral, resp. rate 16, height 5\' 6"  (1.676 m), weight 65.318 kg (144 lb), SpO2 96.00%.   Disposition:  discharge to home in good condition to follow total knee protocol with home health physical therapy CPM 2 week followup appointment with Dr. Thomasena Edis. Patient on postoperative Lovenox treatment for DVT prophylaxis  Discharge Orders    Future Orders Please Complete By Expires   Diet general      Call MD / Call 911      Comments:   If you experience chest pain or shortness of breath, CALL 911 and be transported to the hospital emergency room.  If you develope a fever above 101 F, pus (white drainage) or increased drainage or redness at the wound, or calf pain, call your surgeon's office.   Increase activity slowly as tolerated      Weight Bearing as taught in Physical Therapy      Comments:   Use a walker or crutches as instructed.   Discharge instructions      Comments:   Please keep right knee incision clean and dry. Change dressing on a daily basis. Call [phone] for a followup appointment in 2 weeks   CPM      Comments:   Continuous passive motion machine (CPM):      Use the CPM from 0 to 50 for 6 hours per day.      You may increase by 10 per day.  You may break it up into 2 or 3 sessions per day.      Use CPM for 2 weeks or until you are told to stop.   TED hose      Comments:   Use stockings (TED hose)  for 3 weeks on both leg(s).  You may remove them at night for sleeping.   Change dressing      Comments:   Change dressing daily with sterile 4 x 4 inch gauze dressing and apply TED hose.  You may clean the incision with alcohol prior to redressing.     Medication List  As of 03/14/2011  7:52 AM   STOP taking these medications         Magnesium 500 MG Caps         TAKE these medications         acetaminophen 500 MG tablet   Commonly known as: TYLENOL   Take  1,000 mg by mouth every 6 (six) hours as needed. Pain        ADVAIR DISKUS 100-50 MCG/DOSE Aepb   Generic drug: Fluticasone-Salmeterol   Inhale 1 puff into the lungs 2 (two) times daily.      albuterol 90 MCG/ACT inhaler   Commonly known as: PROVENTIL,VENTOLIN   Inhale 2 puffs into the lungs every 4 (four) hours as needed. Wheezing        aspirin 81 MG tablet   Take 81 mg by mouth daily before breakfast.      benazepril 20 MG tablet   Commonly known as: LOTENSIN   Take 20 mg by mouth at bedtime.      CALCIUM + D PO   Take 1 tablet by mouth 2 (two) times daily.      enoxaparin 30 MG/0.3ML Soln   Commonly known as: LOVENOX   Inject 0.3 mLs (30 mg total) into the skin every 12 (twelve) hours.      famotidine 20 MG tablet   Commonly known as: PEPCID   Take 20 mg by mouth 2 (two) times daily as needed. Heart burn        ferrous sulfate 325 (65 FE) MG tablet   Take 1 tablet (325 mg total) by mouth 3 (three) times daily after meals.      fish oil-omega-3 fatty acids 1000 MG capsule   Take 2 g by mouth daily.      Flax Seed Oil 1000 MG Caps   Take 2 capsules by mouth daily.      FLONASE 50 MCG/ACT nasal spray   Generic drug: fluticasone   Place 1 spray into the nose 2 (two) times daily as needed. Allergies        meclizine 25 MG tablet   Commonly known as: ANTIVERT   Take 12.5 mg by mouth 3 (three) times daily as needed. Inner ear        methocarbamol 500 MG tablet   Commonly known as: ROBAXIN   Take 1 tablet (500 mg total) by mouth every 6 (six) hours as needed.      montelukast 10 MG tablet   Commonly known as: SINGULAIR   Take 10 mg by mouth at bedtime.      OSTEO BI-FLEX ADV JOINT SHIELD PO   Take 2 tablets by mouth daily.      oxyCODONE 5 MG immediate release tablet   Commonly known as: Oxy IR/ROXICODONE   Take 1-3 tablets (5-15 mg total) by mouth every 3 (three) hours as needed.      rosuvastatin 10 MG tablet   Commonly known as: CRESTOR   Take 10  mg by mouth at bedtime.      vitamin B-12 1000 MCG tablet   Commonly known as: CYANOCOBALAMIN   Take 1,000 mcg by  mouth daily.      vitamin C 1000 MG tablet   Take 1,000 mg by mouth daily.             SignedJamelle Rushing 03/14/2011, 7:52 AM

## 2011-03-14 NOTE — Progress Notes (Signed)
  CARE MANAGEMENT NOTE 03/14/2011  Patient:  Cathy Lewis, Cathy Lewis   Account Number:  [id number]  Date Initiated:  03/12/2011  Documentation initiated by:  Colleen Can  Subjective/Objective Assessment:   dx osteoarthritis knee-right; total knee replacemnt     Action/Plan:   patient is currently sleeping. No one else in room. CM will follow   Anticipated DC Date:  03/14/2011   Anticipated DC Plan:  HOME W HOME HEALTH SERVICES      DC Planning Services  CM consult      [facility] Choice  HOME HEALTH   Choice offered to / List presented to:  C-1 Patient   DME arranged  NA      DME agency  NA     HH arranged  HH-2 PT      [facility] agency  [facility]   Status of service:  Completed, signed off Medicare Important Message given?  NA - LOS <3 / Initial given by admissions (If response is "NO", the following Medicare IM given date fields will be blank) Date Medicare IM given:   Date Additional Medicare IM given:    Discharge Disposition:  HOME W HOME HEALTH SERVICES  Per UR Regulation:    Comments:  03/14/2011 Raynelle Bring BSN CCM [phone] Pt for discharge today. Genevieve Norlander will start [facility] services tomorrow 03/15/2011

## 2011-03-14 NOTE — Progress Notes (Signed)
Physical Therapy Treatment Patient Details Name: Cathy Lewis MRN: [id number] DOB: 1946-01-11 Today's Date: 03/14/2011  [phone] 2G, TA  PT Assessment/Plan  PT - Assessment/Plan Comments on Treatment Session: Pt doing much better today.  Tolerated increased ambulation and stair training.  Pts husband to arrive during morning.  Will practice stairs again with husband to ensure safety upon D/C.   PT Plan: Discharge plan remains appropriate PT Frequency: 7X/week Follow Up Recommendations: Home health PT Equipment Recommended: Rolling walker with 5" wheels PT Goals  Acute Rehab PT Goals PT Goal Formulation: With patient Time For Goal Achievement: 7 days Pt will go Supine/Side to Sit: with min assist PT Goal: Supine/Side to Sit - Progress: Met Pt will go Sit to Stand: with supervision PT Goal: Sit to Stand - Progress: Progressing toward goal Pt will go Stand to Sit: with supervision PT Goal: Stand to Sit - Progress: Progressing toward goal Pt will Ambulate: 51 - 150 feet;with least restrictive assistive device;with supervision PT Goal: Ambulate - Progress: Met Pt will Go Up / Down Stairs: 6-9 stairs;with supervision;with least restrictive assistive device PT Goal: Up/Down Stairs - Progress: Updated due to goal met  PT Treatment Precautions/Restrictions  Precautions Precautions: Knee Required Braces or Orthoses: Yes Knee Immobilizer: Discontinue once straight leg raise with < 10 degree lag Restrictions Weight Bearing Restrictions: No RLE Weight Bearing: Weight bearing as tolerated Mobility (including Balance) Bed Mobility Bed Mobility: Yes Supine to Sit: 4: Min assist Supine to Sit Details (indicate cue type and reason): Requires assist with RLE with cues for hand placement to assist trunk.  Transfers Transfers: Yes Sit to Stand: 4: Min assist;With upper extremity assist;With armrests;From bed;From chair/3-in-1 Sit to Stand Details (indicate cue type and reason): performed x  3 for rest breaks during ambulation and stair training.  Requires min/guard for steadying with cues for hand placement on chair/bed before standing.  Stand to Sit: 4: Min assist;With upper extremity assist;With armrests;To chair/3-in-1 Stand to Sit Details: Performed x 3 for rest breaks during ambulation and stair training.  Requires assist with RLE with cues for hand placement Ambulation/Gait Ambulation/Gait: Yes Ambulation/Gait Assistance: 5: Supervision Ambulation/Gait Assistance Details (indicate cue type and reason): Cues for relaxed posture Ambulation Distance (Feet): 100 Feet (another 100 x 2 ) Assistive device: Rolling walker Gait Pattern: Step-to pattern;Trunk flexed Gait velocity: decreased Stairs: Yes Stairs Assistance: 4: Min assist Stairs Assistance Details (indicate cue type and reason): Requires assist to steady RW with cues for sequencing/technique.   Stair Management Technique: No rails;Step to pattern;Backwards;Forwards;With walker Number of Stairs: 4  Height of Stairs: 6     Exercise    End of Session PT - End of Session Equipment Utilized During Treatment: Right knee immobilizer Activity Tolerance: Patient tolerated treatment well Patient left: in chair;with call bell in reach General Behavior During Session: [name] for tasks performed Cognition: [facility] for tasks performed  Cathy Lewis, Cathy Lewis 03/14/2011, 11:11 AM

## 2011-03-14 NOTE — Progress Notes (Signed)
Occupational Therapy Treatment Patient Details Name: Cathy Lewis MRN: [id number] DOB: [birth year] Today's Date: 03/14/2011  OT Assessment/Plan OT Assessment/Plan OT Frequency: Min 2X/week Equipment Recommended: Rolling walker with 5" wheels OT Goals Acute Rehab OT Goals Time For Goal Achievement: 2 days ADL Goals ADL Goal: Statistician - Progress: Progressing toward goals ADL Goal: Toileting - Clothing Manipulation - Progress: Progressing toward goals ADL Goal: Toileting - Hygiene - Progress: Progressing toward goals ADL Goal: Tub/Shower Transfer - Progress: Progressing toward goals  OT Treatment Precautions/Restrictions  Precautions Precautions: Knee Required Braces or Orthoses: Yes Knee Immobilizer: Discontinue once straight leg raise with < 10 degree lag Restrictions Weight Bearing Restrictions: No RLE Weight Bearing: Weight bearing as tolerated   ADL ADL Grooming: Performed;Wash/dry hands;Supervision/safety Where Assessed - Grooming: Standing at sink Lower Body Bathing: Simulated Lower Body Bathing Details (indicate cue type and reason): reviewed, LB bathing plans with pt. also discussed husband to assist prn Lower Body Dressing: Simulated Lower Body Dressing Details (indicate cue type and reason): reviewed options/tech. for LB dressing, pt. declined actual practice, husband to assist prn Toilet Transfer: Performed;Supervision/safety Toilet Transfer Method: Proofreader: Raised toilet seat with arms (or 3-in-1 over toilet) Toileting - Clothing Manipulation: Performed;Supervision/safety Where Assessed - Glass blower/designer Manipulation: Standing Toileting - Hygiene: Performed;Supervision/safety Where Assessed - Toileting Hygiene: Standing Tub/Shower Transfer:  (defer to hhot, pt. to sponge bathe initially) ADL Comments: discussed and reviewed lb bathing and dressing, pt. states husband to assist prn, also reveiwed not to do tub transfer with  shower chair until practiced with hh therapist, pt. agreed will sponge bathe initially Mobility  Bed Mobility Bed Mobility:  (reviwed bed mobility with hob flat) Supine to Sit: 4: Min assist Supine to Sit Details (indicate cue type and reason): Requires assist with RLE with cues for hand placement to assist trunk.  Transfers Transfers: Yes Sit to Stand: 4: Min assist;With upper extremity assist;With armrests;From bed;From chair/3-in-1 Sit to Stand Details (indicate cue type and reason): requested rest breaks during ambulation Stand to Sit: 4: Min assist;With upper extremity assist;With armrests;To chair/3-in-1 Stand to Sit Details: Performed x 3 for rest breaks during ambulation and stair training.  Requires assist with RLE with cues for hand placement Exercises    End of Session OT - End of Session Activity Tolerance: Patient tolerated treatment well Patient left: in chair;with call bell in reach General Behavior During Session: [facility] for tasks performed Cognition: [facility] for tasks performed  Robet Leu  03/14/2011, 12:45 PM

## 2011-03-14 NOTE — Progress Notes (Signed)
Pt states she has had Lovenox teaching and has had correct administration demonstrated by prior shift RNs. Pt self administered AM dose of Lovenox with correct technique.  Verbalizes comfort with this.

## 2011-03-14 NOTE — Progress Notes (Signed)
D/C instructions reviewed w/ pt, pt's husband, and pt's dtr.  All verbalized understanding. All questions answered, no further questions. Pt d/c in w/c in stable condition by NT.  Pt in possession of d/c instructions, scripts, and all personal belongings.

## 2011-03-14 NOTE — Progress Notes (Signed)
Subjective: Patient had no complaints this morning. Patient had eaten breakfast without any problem   Objective: Vital signs in last 24 hours: Temp:  [98.1 F (36.7 C)-100.4 F (38 C)] 98.7 F (37.1 C) (01/18 0515) Pulse Rate:  [70-100] 98  (01/18 0515) Resp:  [16] 16  (01/18 0515) BP: (90-126)/(50-76) 126/76 mmHg (01/18 0515) SpO2:  [90 %-98 %] 96 % (01/18 0515)  Intake/Output from previous day: 01/17 0701 - 01/18 0700 In: 1085 [P.O.:600; I.V.:485] Out: 1800 [Urine:1800] Intake/Output this shift:     Basename 03/14/11 0425 03/13/11 0431 03/12/11 0433  HGB 9.2* 10.5* 10.9*    Basename 03/14/11 0425 03/13/11 0431  WBC 8.7 10.4  RBC 3.13* 3.61*  HCT 27.7* 31.9*  PLT 157 170    Basename 03/13/11 0431 03/12/11 0433  NA 132* 134*  K 3.7 4.0  CL 100 101  CO2 24 24  BUN 9 10  CREATININE 0.95 0.95  GLUCOSE 132* 128*  CALCIUM 8.6 8.3*   No results found for this basename: LABPT:2,INR:2 in the last 72 hours  Patient is sitting up in bed conscious alert and appropriate appears to be in no distress having eat her breakfast. Patient's right knee is well approximated with Steri-Strips no drainage no pressure blisters no signs of infection her calf and thigh are soft and nontender her leg is neuromotor vascularly intact  Assessment/Plan: Postop day #3 right total knee arthroplasty doing well Acute postoperative blood loss anemia asymptomatic rollout self correct with by mouth supplementation of iron Plan out of bed this morning with physical therapy. Once it warms up and roads are clear patient will be discharged to home in good condition for home health physical therapy and CPM. 2 week followup with Dr. Kristine Garbe 03/14/2011, 7:26 AM

## 2011-04-07 ENCOUNTER — Ambulatory Visit: Payer: Medicare Other | Attending: Specialist | Admitting: Physical Therapy

## 2011-04-07 DIAGNOSIS — R5381 Other malaise: Secondary | ICD-10-CM | POA: Insufficient documentation

## 2011-04-07 DIAGNOSIS — M25569 Pain in unspecified knee: Secondary | ICD-10-CM | POA: Insufficient documentation

## 2011-04-07 DIAGNOSIS — M25669 Stiffness of unspecified knee, not elsewhere classified: Secondary | ICD-10-CM | POA: Insufficient documentation

## 2011-04-07 DIAGNOSIS — IMO0001 Reserved for inherently not codable concepts without codable children: Secondary | ICD-10-CM | POA: Insufficient documentation

## 2011-04-09 ENCOUNTER — Ambulatory Visit: Payer: Medicare Other | Admitting: Physical Therapy

## 2011-04-10 ENCOUNTER — Ambulatory Visit: Payer: Medicare Other | Admitting: Physical Therapy

## 2011-04-14 ENCOUNTER — Ambulatory Visit: Payer: Medicare Other | Admitting: Physical Therapy

## 2011-04-16 ENCOUNTER — Ambulatory Visit: Payer: Medicare Other | Admitting: Physical Therapy

## 2011-04-17 ENCOUNTER — Ambulatory Visit: Payer: Medicare Other | Admitting: Physical Therapy

## 2011-04-18 ENCOUNTER — Encounter: Payer: Medicare Other | Admitting: *Deleted

## 2011-04-21 ENCOUNTER — Ambulatory Visit: Payer: Medicare Other | Admitting: Physical Therapy

## 2011-04-23 ENCOUNTER — Ambulatory Visit: Payer: Medicare Other | Admitting: Physical Therapy

## 2011-04-25 ENCOUNTER — Ambulatory Visit: Payer: Medicare Other | Attending: Specialist | Admitting: Physical Therapy

## 2011-04-25 DIAGNOSIS — M25669 Stiffness of unspecified knee, not elsewhere classified: Secondary | ICD-10-CM | POA: Insufficient documentation

## 2011-04-25 DIAGNOSIS — IMO0001 Reserved for inherently not codable concepts without codable children: Secondary | ICD-10-CM | POA: Insufficient documentation

## 2011-04-25 DIAGNOSIS — R5381 Other malaise: Secondary | ICD-10-CM | POA: Insufficient documentation

## 2011-04-25 DIAGNOSIS — M25569 Pain in unspecified knee: Secondary | ICD-10-CM | POA: Insufficient documentation

## 2011-04-28 ENCOUNTER — Ambulatory Visit: Payer: Medicare Other | Attending: Specialist | Admitting: Physical Therapy

## 2011-04-28 DIAGNOSIS — M25669 Stiffness of unspecified knee, not elsewhere classified: Secondary | ICD-10-CM | POA: Insufficient documentation

## 2011-04-28 DIAGNOSIS — IMO0001 Reserved for inherently not codable concepts without codable children: Secondary | ICD-10-CM | POA: Insufficient documentation

## 2011-04-28 DIAGNOSIS — M25569 Pain in unspecified knee: Secondary | ICD-10-CM | POA: Insufficient documentation

## 2011-04-28 DIAGNOSIS — R5381 Other malaise: Secondary | ICD-10-CM | POA: Insufficient documentation

## 2011-04-30 ENCOUNTER — Ambulatory Visit: Payer: Medicare Other | Admitting: Physical Therapy

## 2011-05-02 ENCOUNTER — Ambulatory Visit: Payer: Medicare Other | Admitting: Physical Therapy

## 2011-05-05 ENCOUNTER — Ambulatory Visit: Payer: Medicare Other | Admitting: Physical Therapy

## 2011-05-07 ENCOUNTER — Ambulatory Visit: Payer: Medicare Other | Admitting: Physical Therapy

## 2011-05-08 ENCOUNTER — Other Ambulatory Visit: Payer: Self-pay | Admitting: Specialist

## 2011-05-08 DIAGNOSIS — M25512 Pain in left shoulder: Secondary | ICD-10-CM

## 2011-05-09 ENCOUNTER — Ambulatory Visit: Payer: Medicare Other | Admitting: Physical Therapy

## 2011-05-12 ENCOUNTER — Ambulatory Visit: Payer: Medicare Other | Admitting: Physical Therapy

## 2011-05-14 ENCOUNTER — Ambulatory Visit
Admission: RE | Admit: 2011-05-14 | Discharge: 2011-05-14 | Disposition: A | Discharge status: Home / Self Care | Payer: Medicare Other | Source: Ambulatory Visit | Attending: Specialist | Admitting: Specialist

## 2011-05-14 DIAGNOSIS — M25512 Pain in left shoulder: Secondary | ICD-10-CM

## 2011-05-15 ENCOUNTER — Ambulatory Visit: Payer: Medicare Other | Admitting: Physical Therapy

## 2011-05-19 ENCOUNTER — Ambulatory Visit: Payer: Medicare Other | Admitting: Physical Therapy

## 2011-05-20 ENCOUNTER — Emergency Department (HOSPITAL_COMMUNITY)
Admission: EM | Admit: 2011-05-20 | Discharge: 2011-05-20 | Disposition: A | Discharge status: Home Health | Payer: Medicare Other | Attending: Emergency Medicine | Admitting: Emergency Medicine

## 2011-05-20 DIAGNOSIS — E785 Hyperlipidemia, unspecified: Secondary | ICD-10-CM | POA: Insufficient documentation

## 2011-05-20 DIAGNOSIS — J45909 Unspecified asthma, uncomplicated: Secondary | ICD-10-CM | POA: Insufficient documentation

## 2011-05-20 DIAGNOSIS — I10 Essential (primary) hypertension: Secondary | ICD-10-CM | POA: Insufficient documentation

## 2011-05-20 DIAGNOSIS — M549 Dorsalgia, unspecified: Secondary | ICD-10-CM

## 2011-05-20 DIAGNOSIS — Z79899 Other long term (current) drug therapy: Secondary | ICD-10-CM | POA: Insufficient documentation

## 2011-05-20 DIAGNOSIS — K219 Gastro-esophageal reflux disease without esophagitis: Secondary | ICD-10-CM | POA: Insufficient documentation

## 2011-05-20 MED ORDER — KETOROLAC TROMETHAMINE 30 MG/ML IJ SOLN
INTRAMUSCULAR | Status: AC
  Administered 2011-05-20: 15 mg
  Filled 2011-05-20: qty 1

## 2011-05-20 MED ORDER — IBUPROFEN 200 MG PO TABS
400.0000 mg | ORAL_TABLET | Freq: Once | ORAL | Status: AC
  Administered 2011-05-20: 400 mg via ORAL
  Filled 2011-05-20: qty 2

## 2011-05-20 MED ORDER — DIAZEPAM 5 MG/ML IJ SOLN
5.0000 mg | Freq: Once | INTRAMUSCULAR | Status: AC
  Administered 2011-05-20: 5 mg via INTRAMUSCULAR
  Filled 2011-05-20: qty 2

## 2011-05-20 MED ORDER — HYDROMORPHONE HCL PF 1 MG/ML IJ SOLN
1.0000 mg | Freq: Once | INTRAMUSCULAR | Status: AC
  Administered 2011-05-20: 1 mg via INTRAMUSCULAR
  Filled 2011-05-20: qty 1

## 2011-05-20 MED ORDER — DIAZEPAM 5 MG PO TABS: 5.0000 mg | ORAL_TABLET | Freq: Three times a day (TID) | ORAL | Status: AC | PRN

## 2011-05-20 MED ORDER — KETOROLAC TROMETHAMINE 15 MG/ML IJ SOLN
15.0000 mg | Freq: Once | INTRAMUSCULAR | Status: DC
  Filled 2011-05-20: qty 1

## 2011-05-20 NOTE — ED Notes (Signed)
MD at bedside. 

## 2011-05-20 NOTE — ED Provider Notes (Signed)
History     66 year old female with back pain. Right lower back. Patient has a history of chronic pain. Pain is acutely worse since earlier today. Patient states that she "twisted it" while in shower today. Achy pain at rest and exacerbated by movement and ambulation. Pain does not radiate. Denies any numbness, tingling or loss of strength. No urinary complaints. No fevers or chills. Denies history of back surgery. No blodo thinners aside from asa.  Patient has a history of chronic back pain which she has gotten injections for previously. She states that she is scheduled to have another injection tomorrow para  CSN: [id number]  Arrival date & time 05/20/11  1758   First MD Initiated Contact with Patient 05/20/11 1820      Chief Complaint  Patient presents with  . Back Pain    (Consider location/radiation/quality/duration/timing/severity/associated sxs/prior treatment) HPI  Past Medical History  Diagnosis Date  . Atypical chest pain     NORMAL CARDIOLITE STUDY   . Dyslipidemia   . Hypertension   . Syncope     HISTORY OF  . Asthma   . Seasonal allergies   . GERD (gastroesophageal reflux disease)   . 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 Abdominal hysterectomy   . Removed rectal cell   . Cystocell   . Appendectomy   . Carpal tunnel release     R  . Other surgical history     bladder vault prolapse  . Other surgical history     right foot bunion and hammer toe surgery   . Other surgical history     right knee arthroscopic   . Other surgical history     right arm ( underarm ) surgery removed ? cyst   . Dilation and curettage of uterus   . Tubal ligation     1977  . 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 Heart attack Father    DECEASED  . Cancer Mother     DECEASED  . Cancer Brother     LUNG CA  . Aneurysm Brother     AAA    History  Substance Use Topics  . Smoking status: Former Smoker -- 0.2 packs/day    Quit date: 02/24/1985  . Smokeless tobacco: Not on file  . Alcohol Use: No    OB History    Grav Para Term Preterm Abortions TAB SAB Ect Mult Living                  Review of Systems   Review of symptoms negative unless otherwise noted in HPI.   Allergies  Tramadol; Avelox; and Etodolac  Home Medications   Current Outpatient Rx  Name Route Sig Dispense Refill  . ACETAMINOPHEN 500 MG PO TABS Oral Take 1,000 mg by mouth every 6 (six) hours as needed. Pain     . ALBUTEROL 90 MCG/ACT IN AERS Inhalation Inhale 2 puffs into the lungs every 4 (four) hours as needed. Wheezing     . VITAMIN C 1000 MG PO TABS Oral Take 1,000 mg by mouth daily.     . ASPIRIN EC 81 MG PO TBEC Oral Take 81 mg by mouth daily.    Marland Kitchen BENAZEPRIL HCL  20 MG PO TABS Oral Take 20 mg by mouth at bedtime.     Marland Kitchen CALCIUM + D PO Oral Take 1 tablet by mouth 2 (two) times daily.     . OMEGA-3 FATTY ACIDS 1000 MG PO CAPS Oral Take 1 g by mouth 2 (two) times daily.     Marland Kitchen FLAX SEED OIL 1000 MG PO CAPS Oral Take 2 capsules by mouth at bedtime.     Marland Kitchen FLUTICASONE PROPIONATE 50 MCG/ACT NA SUSP Nasal Place 1 spray into the nose 2 (two) times daily as needed. Allergies     . FLUTICASONE-SALMETEROL 100-50 MCG/DOSE IN AEPB Inhalation Inhale 1 puff into the lungs 2 (two) times daily.     Marland Kitchen MAGNESIUM GLUCONATE 500 MG PO TABS Oral Take 500 mg by mouth daily.    Marland Kitchen MECLIZINE HCL 25 MG PO TABS Oral Take 12.5 mg by mouth 3 (three) times daily as needed. For dizziness.    . METHOCARBAMOL 500 MG PO TABS Oral Take 500 mg by mouth every 6 (six) hours as needed. For pain.    . OSTEO BI-FLEX ADV JOINT SHIELD PO Oral Take 2 tablets by mouth daily.     Marland Kitchen MONTELUKAST SODIUM 10 MG PO TABS Oral Take 10 mg by mouth daily.     . OXYCODONE HCL 5 MG PO TABS  Oral Take 5-15 mg by mouth every 4 (four) hours as needed. For pain.    Marland Kitchen RANITIDINE HCL 150 MG PO TABS Oral Take 150 mg by mouth daily as needed. For indigestion.    Marland Kitchen ROSUVASTATIN CALCIUM 10 MG PO TABS Oral Take 10 mg by mouth at bedtime.     Marland Kitchen VITAMIN B-12 1000 MCG PO TABS Oral Take 1,000 mcg by mouth daily.       BP 145/73  Pulse 93  Temp(Src) 98.2 F (36.8 C) (Oral)  Resp 16  SpO2 95%  Physical Exam  Nursing note and vitals reviewed. Constitutional: She appears well-developed and well-nourished. No distress.  HENT:  Head: Normocephalic and atraumatic.  Eyes: Conjunctivae are normal. Right eye exhibits no discharge. Left eye exhibits no discharge.  Neck: Neck supple.  Cardiovascular: Normal rate, regular rhythm and normal heart sounds.  Exam reveals no gallop and no friction rub.   No murmur heard. Pulmonary/Chest: Effort normal and breath sounds normal. No respiratory distress.  Abdominal: Soft. She exhibits no distension. There is no tenderness.  Musculoskeletal: She exhibits no edema and no tenderness.       Patient points to the area of her right sacroiliac joint as area of pain. Mild tenderness on palpation. There is no overlying skin changes. She has no midline spinal tenderness. No reproducibility of symptoms with range of motion of her hips. Neurovascularly intact distally  Neurological: She is alert. She exhibits normal muscle tone. Coordination normal.  Skin: Skin is warm and dry.  Psychiatric: She has a normal mood and affect. Her behavior is normal. Thought content normal.    ED Course  Procedures (including critical care time)  Labs Reviewed - No data to display No results found.   1. Back pain       MDM  66 year old female with back pain. Is no history of trauma. Patient has a history of chronic back pain and suspect that this is an acute exacerbation. She has no neurological complaints and has a nonfocal neurological examination. She is no history of  spinal surgery. She denies IV drug use. She is not on blood thinning medication  aside from 81 mg of aspirin daily. Afebrile. Low clinical suspicion for urgent or emergent etiology. Patient reports improved symptoms with pain medication in ER. Patient actually has a prescheduled appointment for a back injection tomorrow. Feel she is safe for followup then. Return precautions were discussed.        Raeford Razor, MD 05/20/11 2129

## 2011-05-20 NOTE — Discharge Instructions (Signed)
Back Exercises Back exercises help treat and prevent back injuries. The goal of back exercises is to increase the strength of your abdominal and back muscles and the flexibility of your back. These exercises should be started when you no longer have back pain. Back exercises include:  Pelvic Tilt. Lie on your back with your knees bent. Tilt your pelvis until the lower part of your back is against the floor. Hold this position 5 to 10 sec and repeat 5 to 10 times.   Knee to Chest. Pull first 1 knee up against your chest and hold for 20 to 30 seconds, repeat this with the other knee, and then both knees. This may be done with the other leg straight or bent, whichever feels better.   Sit-Ups or Curl-Ups. Bend your knees 90 degrees. Start with tilting your pelvis, and do a partial, slow sit-up, lifting your trunk only 30 to 45 degrees off the floor. Take at least 2 to 3 seconds for each sit-up. Do not do sit-ups with your knees out straight. If partial sit-ups are difficult, simply do the above but with only tightening your abdominal muscles and holding it as directed.   Hip-Lift. Lie on your back with your knees flexed 90 degrees. Push down with your feet and shoulders as you raise your hips a couple inches off the floor; hold for 10 seconds, repeat 5 to 10 times.   Back arches. Lie on your stomach, propping yourself up on bent elbows. Slowly press on your hands, causing an arch in your low back. Repeat 3 to 5 times. Any initial stiffness and discomfort should lessen with repetition over time.   Shoulder-Lifts. Lie face down with arms beside your body. Keep hips and torso pressed to floor as you slowly lift your head and shoulders off the floor.  Do not overdo your exercises, especially in the beginning. Exercises may cause you some mild back discomfort which lasts for a few minutes; however, if the pain is more severe, or lasts for more than 15 minutes, do not continue exercises until you see your  caregiver. Improvement with exercise therapy for back problems is slow.  See your caregivers for assistance with developing a proper back exercise program. Document Released: 03/20/2004 Document Revised: 01/30/2011 Document Reviewed: 02/10/2005 ExitCare Patient Information 2012 [employer].Back Pain, Adult Low back pain is very common. About 1 in 5 people have back pain.The cause of low back pain is rarely dangerous. The pain often gets better over time.About half of people with a sudden onset of back pain feel better in just 2 weeks. About 8 in 10 people feel better by 6 weeks.  CAUSES Some common causes of back pain include:  Strain of the muscles or ligaments supporting the spine.   Wear and tear (degeneration) of the spinal discs.   Arthritis.   Direct injury to the back.  DIAGNOSIS Most of the time, the direct cause of low back pain is not known.However, back pain can be treated effectively even when the exact cause of the pain is unknown.Answering your caregiver's questions about your overall health and symptoms is one of the most accurate ways to make sure the cause of your pain is not dangerous. If your caregiver needs more information, he or she may order lab work or imaging tests (X-rays or MRIs).However, even if imaging tests show changes in your back, this usually does not require surgery. HOME CARE INSTRUCTIONS For many people, back pain returns.Since low back pain is rarely   dangerous, it is often a condition that people can learn to manageon their own.   Remain active. It is stressful on the back to sit or stand in one place. Do not sit, drive, or stand in one place for more than 30 minutes at a time. Take short walks on level surfaces as soon as pain allows.Try to increase the length of time you walk each day.   Do not stay in bed.Resting more than 1 or 2 days can delay your recovery.   Do not avoid exercise or work.Your body is made to move.It is not dangerous  to be active, even though your back may hurt.Your back will likely heal faster if you return to being active before your pain is gone.   Pay attention to your body when you bend and lift. Many people have less discomfortwhen lifting if they bend their knees, keep the load close to their bodies,and avoid twisting. Often, the most comfortable positions are those that put less stress on your recovering back.   Find a comfortable position to sleep. Use a firm mattress and lie on your side with your knees slightly bent. If you lie on your back, put a pillow under your knees.   Only take over-the-counter or prescription medicines as directed by your caregiver. Over-the-counter medicines to reduce pain and inflammation are often the most helpful.Your caregiver may prescribe muscle relaxant drugs.These medicines help dull your pain so you can more quickly return to your normal activities and healthy exercise.   Put ice on the injured area.   Put ice in a plastic bag.   Place a towel between your skin and the bag.   Leave the ice on for 15 to 20 minutes, 3 to 4 times a day for the first 2 to 3 days. After that, ice and heat may be alternated to reduce pain and spasms.   Ask your caregiver about trying back exercises and gentle massage. This may be of some benefit.   Avoid feeling anxious or stressed.Stress increases muscle tension and can worsen back pain.It is important to recognize when you are anxious or stressed and learn ways to manage it.Exercise is a great option.  SEEK MEDICAL CARE IF:  You have pain that is not relieved with rest or medicine.   You have pain that does not improve in 1 week.   You have new symptoms.   You are generally not feeling well.  SEEK IMMEDIATE MEDICAL CARE IF:   You have pain that radiates from your back into your legs.   You develop new bowel or bladder control problems.   You have unusual weakness or numbness in your arms or legs.   You develop  nausea or vomiting.   You develop abdominal pain.   You feel faint.  Document Released: 02/10/2005 Document Revised: 01/30/2011 Document Reviewed: 07/01/2010 ExitCare Patient Information 2012 [employer].  RESOURCE GUIDE  Dental Problems  Patients with Medicaid: [facility]                     [facility] [address]                                           [address] Phone:  [phone]                                                    Phone:  [phone]  If unable to pay or uninsured, contact:  [facility] or [facility] to become qualified for the adult dental clinic.  Chronic Pain Problems Contact  [facility]  297-2271 Patients need to be referred by their primary care doctor.  Insufficient Money for Medicine Contact [employer]:  call "211" or [facility] 271-5999.  No Primary Care Doctor Call [facility]  832-8000 Other agencies that provide inexpensive medical care    [facility]  832-8035    [facility]  832-7272    [facility]  271-5999    Women[facility] Clinic  832-4777    [facility]  373-0678    [facility]  272-1050  Psychological Services [facility]  832-9600 [facility]  [phone] [facility]   800 853-5163 (emergency services 641-4993)  Substance Abuse Resources [facility]  336-882-2125 [facility] 336-784-9470 [facility] 336-285-9073 [facility] 336-845-3988 Residential & Outpatient Substance Abuse Program  800-659-3381  Abuse/Neglect [place] Child Abuse Hotline (336) 641-3795 [place] Child Abuse Hotline 800-378-5315 (After Hours)  Emergency Shelter [facility] (336) 271-5985  [facility] (336) 275-9566 [facility] (704) 372-4663  MRSA Hotline #:    832-7006    [place] Resources  [facility] [place]     [employer]                          [facility] [address] [place]                       [address]      [address]  [place]                                                [place]                            [place] Phone:  [phone]                                   Phone:  [phone]                 Phone:  [phone]  [facility] Phone:  342-8316  [place] Child Abuse Hotline (336) 342-1394 (336) 342-3537 (After Hours)   

## 2011-05-20 NOTE — ED Notes (Signed)
Pharmacy sts pt has had reactions to NSAIDs in past. Monitoring pt for reactions prior to discharge.

## 2011-05-20 NOTE — ED Notes (Signed)
Pt states she cannot bear weight or move d/t her chronic back pain.

## 2011-05-22 ENCOUNTER — Encounter: Payer: Medicare Other | Admitting: Physical Therapy

## 2011-05-26 ENCOUNTER — Other Ambulatory Visit: Payer: Self-pay | Admitting: Physical Medicine and Rehabilitation

## 2011-05-26 DIAGNOSIS — M545 Low back pain: Secondary | ICD-10-CM

## 2011-05-27 ENCOUNTER — Other Ambulatory Visit: Payer: Self-pay | Admitting: Family Medicine

## 2011-05-27 ENCOUNTER — Ambulatory Visit
Admission: RE | Admit: 2011-05-27 | Discharge: 2011-05-27 | Disposition: A | Discharge status: Home / Self Care | Payer: Medicare Other | Source: Ambulatory Visit | Attending: Physical Medicine and Rehabilitation | Admitting: Physical Medicine and Rehabilitation

## 2011-05-27 DIAGNOSIS — N2889 Other specified disorders of kidney and ureter: Secondary | ICD-10-CM

## 2011-05-27 DIAGNOSIS — M545 Low back pain: Secondary | ICD-10-CM

## 2011-05-29 ENCOUNTER — Ambulatory Visit
Admission: RE | Admit: 2011-05-29 | Discharge: 2011-05-29 | Disposition: A | Discharge status: Home / Self Care | Payer: Medicare Other | Source: Ambulatory Visit | Attending: Family Medicine | Admitting: Family Medicine

## 2011-05-29 DIAGNOSIS — N2889 Other specified disorders of kidney and ureter: Secondary | ICD-10-CM

## 2011-05-29 MED ORDER — GADOBENATE DIMEGLUMINE 529 MG/ML IV SOLN
13.0000 mL | Freq: Once | INTRAVENOUS | Status: AC | PRN
  Administered 2011-05-29: 13 mL via INTRAVENOUS

## 2011-06-03 ENCOUNTER — Other Ambulatory Visit: Payer: Medicare Other

## 2011-07-04 ENCOUNTER — Ambulatory Visit
Admission: RE | Admit: 2011-07-04 | Discharge: 2011-07-04 | Disposition: A | Discharge status: Home / Self Care | Payer: Medicare Other | Source: Ambulatory Visit | Attending: Specialist | Admitting: Specialist

## 2012-02-25 HISTORY — PX: HERNIA REPAIR: SHX51

## 2012-02-25 HISTORY — PX: NEPHRECTOMY: SHX65

## 2012-05-14 ENCOUNTER — Other Ambulatory Visit: Payer: Self-pay | Admitting: Family Medicine

## 2012-05-14 DIAGNOSIS — R109 Unspecified abdominal pain: Secondary | ICD-10-CM

## 2012-05-17 ENCOUNTER — Ambulatory Visit
Admission: RE | Admit: 2012-05-17 | Discharge: 2012-05-17 | Disposition: A | Discharge status: Home / Self Care | Payer: Medicare Other | Source: Ambulatory Visit | Attending: Family Medicine | Admitting: Family Medicine

## 2012-05-17 DIAGNOSIS — R109 Unspecified abdominal pain: Secondary | ICD-10-CM

## 2012-12-29 ENCOUNTER — Encounter: Payer: Self-pay | Admitting: Podiatry

## 2012-12-29 ENCOUNTER — Ambulatory Visit (INDEPENDENT_AMBULATORY_CARE_PROVIDER_SITE_OTHER): Payer: Medicare Other | Admitting: Podiatry

## 2012-12-29 ENCOUNTER — Ambulatory Visit (INDEPENDENT_AMBULATORY_CARE_PROVIDER_SITE_OTHER): Payer: Medicare Other

## 2012-12-29 VITALS — BP 142/86 | HR 80 | Resp 12 | Ht 65.0 in | Wt 134.0 lb

## 2012-12-29 DIAGNOSIS — R52 Pain, unspecified: Secondary | ICD-10-CM

## 2012-12-29 DIAGNOSIS — M2010 Hallux valgus (acquired), unspecified foot: Secondary | ICD-10-CM

## 2012-12-29 DIAGNOSIS — M7750 Other enthesopathy of unspecified foot: Secondary | ICD-10-CM

## 2012-12-29 NOTE — Progress Notes (Signed)
N-SORE L-LT FOOT BUNION D-1 Harold Barban C-WORSE A-SHOES T-PADDING

## 2012-12-29 NOTE — Progress Notes (Signed)
Subjective:     Patient ID: Cathy Lewis, female   DOB: Jul 31, 1945, 67 y.o.   MRN: [id number]  HPI patient states my left bunion is starting to become increasingly sore. States that she has had her right knee replaced a hernia repair done in the last 2 years and now the bunion is really bothering her over the last 6 months   Review of Systems  All other systems reviewed and are negative.       Objective:   Physical Exam  Constitutional: She is oriented to person, place, and time. She appears well-developed.  Cardiovascular: Intact distal pulses.   Musculoskeletal: Normal range of motion.  Neurological: She is oriented to person, place, and time.  Skin: Skin is warm.   Neurological intact bilateral both DTR reflexes and muscle strength. The patient's left first metatarsal is prominent at the eminence and sore when pressed. 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 Discussed options and she wants correctiion like her right one. I reviewed correction and discussed structural bunion or seizure to reduce the eminence and to reduce the intermetatarsal angle. Patient read consent form for correction understanding risks as outlined in the consent form and that she will have a pin and a total recovery. We'll take 6 months to one year area fracture walker dispensed for postoperative and also due to moderate tendinitis-like symptoms she complains about scheduled for surgery and will call if any questions prior

## 2012-12-29 NOTE — Patient Instructions (Signed)
Bunion Care A bunion is a boney protrusion at the base of your big toe (metatarsal-phalangeal joint). This problem, if painful or troublesome can be corrected with surgery. This is an elective surgery, so you can pick a convenient time for the procedure. The surgery may:  Improve appearance (cosmetic).  Relieve pain.  Improve function. Your foot is made up of a complex set of twenty-six bones which are held together by tough fibrous ligaments. The movement of the foot is controlled by muscles in the foot and leg. These muscles attach to the foot by cord like structures (tendons) that attach muscle to bone. If surgery is recommended, your caregiver will explain your foot problem and how surgery can improve it. Your caregiver can answer questions you may have about the potential risks and complications involved. After determining that foot surgery is necessary to correct your problem, you can proceed with plans for the surgery. LET YOUR CAREGIVER KNOW ABOUT:   Previous problems with anesthetics or medicines used to numb the skin.  Allergies to dyes, iodine, foods, and/or latex.  Medicines taken including herbs, eye drops, prescription medicines (especially medicines used to "thin the blood"), aspirin and other over-the-counter medicines, and steroids (by mouth or as a cream).  History of bleeding or blood problems.  Possibility of pregnancy, if this applies.  History of blood clots in your legs and/or lungs.  Previous surgery.  Other important health problems. Let your caregiver know about health changes prior to surgery. BEFORE THE PROCEDURE  You should be present 60 minutes prior to your procedure or as directed.  PROCEDURE BUNION TYPES AND THEIR TREATMENTS  Positional Bunion. A positional bunion develops when a bony growth on the side of the metatarsal bone enlarges the joint. The metatarsal forces the joint capsule to stretch over it. As this growth pushes the big toe toward the  others, the tendons on the inside tighten. This forces the big toe farther out of alignment. The bunion presses against the shoe, irritating the skin and causes further pain and disability.  Positional Bunionectomy Treatment. The bunion is removed. Tight tendons may be released.  Follow-up Care. Your toe is apt to be stiff at first but will loosen up as you move it. You may need to wear a special surgical shoe and, possibly, a splint for about three weeks.  Mild Structural Bunion. Structural bunions occur when the angle between the first and second metatarsal bones increases to a point where it is greater than normal. The increased angle of the metatarsals makes the big toe slant toward the other toes. Sometimes bony growths may form. Irritation and swelling often follow.  Structural Bunionectomy Treatment. Your caregiver surgically repositions the bone by decreasing the angle and may use a fixation device to hold it together. The bunion (bump) is also removed.  Degenerative Joint Disease (Arthritis). When wear-and-tear arthritis (osteoarthritis) of aging affects the big toe joint, pain and reduced joint motion may result. This is not a true bunion but may be associated with bunions. Left untreated, it can increase wear and tear in the joint and break down the cartilage. Pain and stiffness are problems of both wear-and-tear arthritis and rheumatoid arthritis.  Arthroplasty With Joint Implantation As Treatment. The bunion is first removed; then the degenerated joint is removed and replaced with an implant. AFTER THE PROCEDURE  After surgery your bone will heal in phases. A callous (new bone formation) forms, bridging the damaged bone allowing it to heal. In about 6 months, the bone   is back to normal strength along with a return of nearly normal function. It is best to do elective surgeries when your health is optimal.  After surgery, you will be taken to the recovery area where a nurse will watch and  check your progress. Once you are awake, stable, and taking fluids well, barring other problems you will be allowed to go home. HOME CARE INSTRUCTIONS  Be sure to ask your caregiver how long you will be off your feet and home from work. Plan accordingly. There are several types of bunions and varying surgical treatments for each. Common types are explained above. Your surgery may be similar and may include a fixation device (such as a small screw). Your foot and ankle may be immobilized by a cast (from your toes to below your knee). You may be asked not to bear weight on this foot for a few weeks or until comfortable. Once home, an ice pack applied to your operative site may help with discomfort and keep the swelling down. You may be able to walk a day or two after surgery. Your podiatrist may prescribe a splint or a special shoe to be worn for several weeks. Only take over-the-counter or prescription medicines for pain, discomfort, or fever as directed by your caregiver.  SEEK MEDICAL CARE IF:   There is increased bleeding (more than a small spot) from the surgical site.  You notice redness, swelling, or increasing pain in the surgical site.  Pus is coming from the site.  An unexplained oral temperature above 102 F (38.9 C) develops.  You notice a foul smell coming from the surgical site or dressing. SEEK IMMEDIATE MEDICAL CARE IF:  You develop a rash, have difficulty breathing, or have any allergic problems with medications. Document Released: 02/08/2000 Document Revised: 05/05/2011 Document Reviewed: 02/14/2008 ExitCare Patient Information 2014 [employer].  

## 2012-12-30 ENCOUNTER — Telehealth: Payer: Self-pay | Admitting: *Deleted

## 2012-12-30 NOTE — Telephone Encounter (Signed)
Pt states will receive an injection in her back next week.  Is it still okay to have the foot surgery the next week?  Dr Charlsie Merles states it will be okay.  I informed pt.

## 2013-01-11 ENCOUNTER — Encounter: Payer: Self-pay | Admitting: Podiatry

## 2013-01-11 DIAGNOSIS — M2010 Hallux valgus (acquired), unspecified foot: Secondary | ICD-10-CM

## 2013-01-11 HISTORY — PX: BUNIONECTOMY: SHX129

## 2013-01-14 ENCOUNTER — Telehealth: Payer: Self-pay | Admitting: *Deleted

## 2013-01-14 NOTE — Telephone Encounter (Signed)
Pt states the area over her big toe feels like a blister is rubbing, but sees no skin problem.  I instructed pt to remove ace only, elevate foot 15 mins. then rewrap ace looser starting at the toes over the gauze and wrapping up the leg if necessary.  Pt asked if had to sleep in the boot.  I said she must sleep in the boot and walk in the boot at all times, but may remove the boot if sitting elevating.  Pt states understanding.

## 2013-01-17 ENCOUNTER — Encounter: Payer: Self-pay | Admitting: Podiatry

## 2013-01-17 ENCOUNTER — Ambulatory Visit (INDEPENDENT_AMBULATORY_CARE_PROVIDER_SITE_OTHER): Payer: Medicare Other | Admitting: Podiatry

## 2013-01-17 ENCOUNTER — Ambulatory Visit (INDEPENDENT_AMBULATORY_CARE_PROVIDER_SITE_OTHER): Payer: Medicare Other

## 2013-01-17 VITALS — BP 150/88 | HR 88 | Resp 12

## 2013-01-17 DIAGNOSIS — Z9889 Other specified postprocedural states: Secondary | ICD-10-CM

## 2013-01-17 DIAGNOSIS — M2010 Hallux valgus (acquired), unspecified foot: Secondary | ICD-10-CM

## 2013-01-21 NOTE — Progress Notes (Signed)
Subjective:     Patient ID: Cathy Lewis, female   DOB: 1945-11-28, 67 y.o.   MRN: [id number]  HPI patient states I'm doing fine without pain. Walking with my boot and doing well  Review of Systems     Objective:   Physical Exam Neurovascular status intact with good structure of the first MPJ noted with mild edema in incision site well coapted    Assessment:    patient is presenting well with no issues    Plan:     Continue current course and advised on anti-inflammatories continued compression gradual increase in activity with range of motion exercises recommended

## 2013-02-03 ENCOUNTER — Telehealth: Payer: Self-pay | Admitting: *Deleted

## 2013-02-03 NOTE — Telephone Encounter (Signed)
Pt states that the skin around her incision site is dry, can she use Neosporin ointment.  I told her as she bathe the dry skin would slough off, that as long as the incision was closed and without drainage it was not necessary.  Pt ask if she had to use the ace, it caused the foot to hurt in her shoe.  I told her if she had a compression hose she could use that or if no swelling could go without.  Pt states she's already gone without the ace and has not had a lot of swelling.

## 2013-02-07 ENCOUNTER — Encounter: Payer: Self-pay | Admitting: Podiatry

## 2013-02-07 ENCOUNTER — Ambulatory Visit (INDEPENDENT_AMBULATORY_CARE_PROVIDER_SITE_OTHER): Payer: Medicare Other

## 2013-02-07 ENCOUNTER — Ambulatory Visit (INDEPENDENT_AMBULATORY_CARE_PROVIDER_SITE_OTHER): Payer: Medicare Other | Admitting: Podiatry

## 2013-02-07 VITALS — BP 124/80 | HR 84 | Resp 12

## 2013-02-07 DIAGNOSIS — Z9889 Other specified postprocedural states: Secondary | ICD-10-CM

## 2013-02-07 DIAGNOSIS — M2010 Hallux valgus (acquired), unspecified foot: Secondary | ICD-10-CM

## 2013-02-07 NOTE — Progress Notes (Signed)
Subjective:     Patient ID: Cathy Lewis, female   DOB: 11-09-1945, 67 y.o.   MRN: [id number]  HPI patient presents stating I'm doing very well with my bunion surgery of several months duration   Review of Systems     Objective:   Physical Exam Neurovascular status intact with well-healed surgical site first MPJ left and good range of motion noted    Assessment:     Healing well from bunion surgery left    Plan:     X-ray reviewed and anklet dispensed with instructions on gradual return to saw shoe gear and range of motion activities for the first MPJ

## 2013-03-01 NOTE — Progress Notes (Signed)
1) [place] bunionectomy with pin fixation left foot

## 2013-03-07 ENCOUNTER — Encounter: Payer: Medicare Other | Admitting: Podiatry

## 2013-03-07 NOTE — Progress Notes (Signed)
1) [place] bunionectomy left foot 

## 2013-03-09 ENCOUNTER — Encounter: Payer: Medicare Other | Admitting: Podiatry

## 2013-03-28 ENCOUNTER — Ambulatory Visit (INDEPENDENT_AMBULATORY_CARE_PROVIDER_SITE_OTHER): Payer: Medicare Other | Admitting: Podiatry

## 2013-03-28 ENCOUNTER — Encounter: Payer: Medicare Other | Admitting: Podiatry

## 2013-03-28 ENCOUNTER — Ambulatory Visit (INDEPENDENT_AMBULATORY_CARE_PROVIDER_SITE_OTHER): Payer: Medicare Other

## 2013-03-28 VITALS — BP 123/71 | HR 92 | Resp 17 | Ht 65.0 in | Wt 133.0 lb

## 2013-03-28 DIAGNOSIS — Z9889 Other specified postprocedural states: Secondary | ICD-10-CM

## 2013-03-28 DIAGNOSIS — M2010 Hallux valgus (acquired), unspecified foot: Secondary | ICD-10-CM

## 2013-03-28 NOTE — Progress Notes (Signed)
Subjective:     Patient ID: Cathy Lewis, female   DOB: Nov 02, 1945, 68 y.o.   MRN: [id number]  HPI patient states my left foot is doing well after surgery   Review of Systems     Objective:   Physical Exam Neurovascular status intact with well-healing surgical site first MPJ left with good range of motion and no pain    Assessment:     Doing well postop [place] bunionectomy left    Plan:     Reviewed x-rays and allow patient to return to normal activity discharge and see as needed

## 2013-03-28 NOTE — Progress Notes (Signed)
Pt presents for POV3 of left foot.  Pt asked if foot was still suppose to be red.

## 2013-05-10 ENCOUNTER — Encounter (HOSPITAL_COMMUNITY): Payer: Self-pay | Admitting: Emergency Medicine

## 2013-05-10 ENCOUNTER — Emergency Department (HOSPITAL_COMMUNITY)
Admission: EM | Admit: 2013-05-10 | Discharge: 2013-05-10 | Discharge status: Against Medical Advice | Payer: Medicare Other | Attending: Emergency Medicine | Admitting: Emergency Medicine

## 2013-05-10 DIAGNOSIS — R1011 Right upper quadrant pain: Secondary | ICD-10-CM | POA: Insufficient documentation

## 2013-05-10 DIAGNOSIS — J45909 Unspecified asthma, uncomplicated: Secondary | ICD-10-CM | POA: Diagnosis not present

## 2013-05-10 DIAGNOSIS — I10 Essential (primary) hypertension: Secondary | ICD-10-CM | POA: Insufficient documentation

## 2013-05-10 LAB — COMPREHENSIVE METABOLIC PANEL
ALBUMIN: 4 g/dL (ref 3.5–5.2)
ALT: 19 U/L (ref 0–35)
AST: 29 U/L (ref 0–37)
Alkaline Phosphatase: 74 U/L (ref 39–117)
BUN: 19 mg/dL (ref 6–23)
CALCIUM: 9.7 mg/dL (ref 8.4–10.5)
CO2: 26 mEq/L (ref 19–32)
CREATININE: 1.38 mg/dL — AB (ref 0.50–1.10)
Chloride: 97 mEq/L (ref 96–112)
GFR calc Af Amer: 44 mL/min — ABNORMAL LOW (ref >90)
GFR calc non Af Amer: 38 mL/min — ABNORMAL LOW (ref >90)
Glucose, Bld: 102 mg/dL — ABNORMAL HIGH (ref 70–99)
Potassium: 3.5 mEq/L — ABNORMAL LOW (ref 3.7–5.3)
Sodium: 141 mEq/L (ref 137–147)
TOTAL PROTEIN: 7.2 g/dL (ref 6.0–8.3)
Total Bilirubin: 0.5 mg/dL (ref 0.3–1.2)

## 2013-05-10 LAB — CBC WITH DIFFERENTIAL/PLATELET
BASOS PCT: 1 % (ref 0–1)
Basophils Absolute: 0 10*3/uL (ref 0.0–0.1)
EOS ABS: 0.1 10*3/uL (ref 0.0–0.7)
EOS PCT: 3 % (ref 0–5)
HEMATOCRIT: 39 % (ref 36.0–46.0)
HEMOGLOBIN: 13.3 g/dL (ref 12.0–15.0)
LYMPHS ABS: 2.3 10*3/uL (ref 0.7–4.0)
Lymphocytes Relative: 53 % — ABNORMAL HIGH (ref 12–46)
MCH: 31.9 pg (ref 26.0–34.0)
MCHC: 34.1 g/dL (ref 30.0–36.0)
MCV: 93.5 fL (ref 78.0–100.0)
MONO ABS: 0.3 10*3/uL (ref 0.1–1.0)
MONOS PCT: 7 % (ref 3–12)
Neutro Abs: 1.6 10*3/uL — ABNORMAL LOW (ref 1.7–7.7)
Neutrophils Relative %: 37 % — ABNORMAL LOW (ref 43–77)
Platelets: 169 10*3/uL (ref 150–400)
RBC: 4.17 MIL/uL (ref 3.87–5.11)
RDW: 16.4 % — ABNORMAL HIGH (ref 11.5–15.5)
WBC: 4.3 10*3/uL (ref 4.0–10.5)

## 2013-05-10 LAB — I-STAT TROPONIN, ED: TROPONIN I, POC: 0 ng/mL (ref 0.00–0.08)

## 2013-05-10 LAB — LIPASE, BLOOD: LIPASE: 44 U/L (ref 11–59)

## 2013-05-10 NOTE — ED Notes (Signed)
Pt is here with right upper abdominal pain, pt reports hernia surgery around navel about 6 months ago.  Pt denies vomiting or diarrhea.

## 2013-05-10 NOTE — ED Notes (Signed)
Patient states she is leaving.   Patient stated "I feel foolish for staying, because I haven't had any pain in 1 hour.   If it gets worse, I will come back".

## 2013-11-04 ENCOUNTER — Emergency Department (HOSPITAL_BASED_OUTPATIENT_CLINIC_OR_DEPARTMENT_OTHER): Payer: Medicare Other

## 2013-11-04 ENCOUNTER — Emergency Department (HOSPITAL_BASED_OUTPATIENT_CLINIC_OR_DEPARTMENT_OTHER)
Admission: EM | Admit: 2013-11-04 | Discharge: 2013-11-04 | Disposition: A | Discharge status: Home / Self Care | Payer: Medicare Other | Attending: Emergency Medicine | Admitting: Emergency Medicine

## 2013-11-04 ENCOUNTER — Encounter (HOSPITAL_BASED_OUTPATIENT_CLINIC_OR_DEPARTMENT_OTHER): Payer: Self-pay | Admitting: Emergency Medicine

## 2013-11-04 DIAGNOSIS — R0789 Other chest pain: Secondary | ICD-10-CM | POA: Insufficient documentation

## 2013-11-04 DIAGNOSIS — R079 Chest pain, unspecified: Secondary | ICD-10-CM | POA: Insufficient documentation

## 2013-11-04 DIAGNOSIS — Z7982 Long term (current) use of aspirin: Secondary | ICD-10-CM | POA: Insufficient documentation

## 2013-11-04 DIAGNOSIS — K219 Gastro-esophageal reflux disease without esophagitis: Secondary | ICD-10-CM | POA: Diagnosis not present

## 2013-11-04 DIAGNOSIS — Z79899 Other long term (current) drug therapy: Secondary | ICD-10-CM | POA: Insufficient documentation

## 2013-11-04 DIAGNOSIS — E785 Hyperlipidemia, unspecified: Secondary | ICD-10-CM | POA: Insufficient documentation

## 2013-11-04 DIAGNOSIS — IMO0002 Reserved for concepts with insufficient information to code with codable children: Secondary | ICD-10-CM | POA: Insufficient documentation

## 2013-11-04 DIAGNOSIS — I10 Essential (primary) hypertension: Secondary | ICD-10-CM | POA: Insufficient documentation

## 2013-11-04 DIAGNOSIS — M129 Arthropathy, unspecified: Secondary | ICD-10-CM | POA: Insufficient documentation

## 2013-11-04 DIAGNOSIS — Z87891 Personal history of nicotine dependence: Secondary | ICD-10-CM | POA: Diagnosis not present

## 2013-11-04 DIAGNOSIS — J45909 Unspecified asthma, uncomplicated: Secondary | ICD-10-CM | POA: Insufficient documentation

## 2013-11-04 LAB — COMPREHENSIVE METABOLIC PANEL
ALBUMIN: 4 g/dL (ref 3.5–5.2)
ALT: 23 U/L (ref 0–35)
AST: 30 U/L (ref 0–37)
Alkaline Phosphatase: 71 U/L (ref 39–117)
Anion gap: 15 (ref 5–15)
BILIRUBIN TOTAL: 0.6 mg/dL (ref 0.3–1.2)
BUN: 18 mg/dL (ref 6–23)
CALCIUM: 10.3 mg/dL (ref 8.4–10.5)
CHLORIDE: 96 meq/L (ref 96–112)
CO2: 27 mEq/L (ref 19–32)
Creatinine, Ser: 1.4 mg/dL — ABNORMAL HIGH (ref 0.50–1.10)
GFR calc Af Amer: 43 mL/min — ABNORMAL LOW (ref >90)
GFR calc non Af Amer: 37 mL/min — ABNORMAL LOW (ref >90)
Glucose, Bld: 92 mg/dL (ref 70–99)
Potassium: 3.7 mEq/L (ref 3.7–5.3)
Sodium: 138 mEq/L (ref 137–147)
Total Protein: 7.8 g/dL (ref 6.0–8.3)

## 2013-11-04 LAB — TROPONIN I

## 2013-11-04 LAB — CBC
HCT: 39.4 % (ref 36.0–46.0)
Hemoglobin: 13.3 g/dL (ref 12.0–15.0)
MCH: 31.7 pg (ref 26.0–34.0)
MCHC: 33.8 g/dL (ref 30.0–36.0)
MCV: 94 fL (ref 78.0–100.0)
PLATELETS: 175 10*3/uL (ref 150–400)
RBC: 4.19 MIL/uL (ref 3.87–5.11)
RDW: 17.2 % — AB (ref 11.5–15.5)
WBC: 5.2 10*3/uL (ref 4.0–10.5)

## 2013-11-04 MED ORDER — ASPIRIN 81 MG PO CHEW
324.0000 mg | CHEWABLE_TABLET | Freq: Once | ORAL | Status: AC
  Administered 2013-11-04: 324 mg via ORAL
  Filled 2013-11-04: qty 4

## 2013-11-04 MED ORDER — SODIUM CHLORIDE 0.9 % IV SOLN
1000.0000 mL | INTRAVENOUS | Status: DC
  Administered 2013-11-04: 1000 mL via INTRAVENOUS

## 2013-11-04 NOTE — Discharge Instructions (Signed)

## 2013-11-04 NOTE — ED Notes (Signed)
Patient transported to X-ray 

## 2013-11-04 NOTE — ED Provider Notes (Signed)
CSN: [id number]     Arrival date & time 11/04/13  1833 History  This chart was scribed for Dorie Rank, MD by Donato Schultz, ED Scribe. This patient was seen in room MH06/MH06 and the patient's care was started at 7:10 PM.     Chief Complaint  Patient presents with  . Chest Pain    Patient is a 68 y.o. female presenting with chest pain. The history is provided by the patient. No language interpreter was used.  Chest Pain Associated symptoms: no abdominal pain, no nausea, no shortness of breath and not vomiting    HPI Comments: Cathy Lewis is a 68 y.o. female who presents to the Emergency Department complaining of intermittent, sharp left sided chest pain near her ribs that started this morning.  She saw her PCP this morning for her symptoms and was told to report to the ED if her symptoms continued or worsened.  She had a normal EKG performed at that time.  She denies nausea, vomiting, SOB, and abdominal pain as associated symptoms.  Nothing aggravates or alleviates the pain.  She does not have a history of heart problems.  She had a normal cardiolite study performed in January of this year by Dr. Einar Gip.     Past Medical History  Diagnosis Date  . Atypical chest pain     NORMAL CARDIOLITE STUDY   . Dyslipidemia   . Hypertension   . Syncope     HISTORY OF  . Asthma   . Seasonal allergies   . GERD (gastroesophageal reflux disease)   . 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 Abdominal hysterectomy    . Removed rectal cell    . Cystocell    . Appendectomy    . Carpal tunnel release      R  . Other surgical history      bladder vault prolapse  . Other surgical history      right foot bunion and hammer toe surgery   . Other surgical history      right knee arthroscopic   . Other surgical history      right arm ( underarm ) surgery removed ? cyst   . Dilation and curettage of uterus    . Tubal ligation      1977  .  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 Heart attack Father     DECEASED  . Cancer Mother     DECEASED  . Cancer Brother     LUNG CA  . Aneurysm Brother     AAA   History  Substance Use Topics  . Smoking status: Former Smoker -- 0.25 packs/day    Quit date: 02/24/1985  . Smokeless tobacco: Not on file  . Alcohol Use: No   OB History   Grav Para Term Preterm Abortions TAB SAB Ect Mult Living                 Review of Systems  Respiratory: Negative for shortness of breath.   Cardiovascular: Positive for chest pain.  Gastrointestinal: Negative for nausea, vomiting and abdominal pain.  All other systems reviewed and are negative.     Allergies  Gnp; Tramadol; Avelox; and Etodolac  Home Medications   Prior to Admission medications   Medication Sig Start Date End Date Taking? Authorizing Provider  acetaminophen (TYLENOL) 500 MG tablet Take 1,000 mg by mouth every 6 (six) hours as needed. Pain     Historical Provider, MD  albuterol (PROVENTIL,VENTOLIN) 90 MCG/ACT inhaler Inhale 2 puffs into the lungs every 4 (four) hours as needed. Wheezing     Historical Provider, MD  Ascorbic Acid (VITAMIN C) 1000 MG tablet Take 1,000 mg by mouth daily.     Historical Provider, MD  aspirin EC 81 MG tablet Take 81 mg by mouth daily.    Historical Provider, MD  benazepril (LOTENSIN) 20 MG tablet Take 20 mg by mouth at bedtime.     Historical Provider, MD  Calcium Carbonate-Vitamin D (CALCIUM + D PO) Take 1 tablet by mouth 2 (two) times daily.     Historical Provider, MD  fish oil-omega-3 fatty acids 1000 MG capsule Take 1 g by mouth 2 (two) times daily.     Historical Provider, MD  fluticasone (FLONASE) 50 MCG/ACT nasal spray Place 1 spray into the nose 2 (two) times daily as needed. Allergies     Historical  Provider, MD  Fluticasone-Salmeterol (ADVAIR DISKUS) 100-50 MCG/DOSE AEPB Inhale 1 puff into the lungs 2 (two) times daily.     Historical Provider, MD  magnesium gluconate (MAGONATE) 500 MG tablet Take 500 mg by mouth daily.    Historical Provider, MD  meclizine (ANTIVERT) 25 MG tablet Take 12.5 mg by mouth 3 (three) times daily as needed. For dizziness.    Historical Provider, MD  methocarbamol (ROBAXIN) 500 MG tablet Take 500 mg by mouth every 6 (six) hours as needed. For pain.    Historical Provider, MD  montelukast (SINGULAIR) 10 MG tablet Take 10 mg by mouth daily.     Historical Provider, MD  rosuvastatin (CRESTOR) 10 MG tablet Take 10 mg by mouth at bedtime.     Historical Provider, MD  vitamin B-12 (CYANOCOBALAMIN) 1000 MCG tablet Take 1,000 mcg by mouth daily.     Historical Provider, MD   BP 139/80  Pulse 87  Temp(Src) 97.7 F (36.5 C) (Oral)  Resp 20  Ht 5\' 5"  (1.651 m)  Wt 134 lb (60.782 kg)  BMI 22.30 kg/m2  SpO2 100%  Physical Exam  Nursing note and vitals reviewed. Constitutional: She appears well-developed and well-nourished. No distress.  HENT:  Head: Normocephalic and atraumatic.  Right Ear: External ear normal.  Left Ear: External ear normal.  Eyes: Conjunctivae are normal. Right eye exhibits no discharge. Left eye exhibits no discharge. No scleral icterus.  Neck: Neck supple. No tracheal deviation present.  Cardiovascular: Normal rate, regular rhythm and intact distal pulses.   Pulmonary/Chest: Effort normal and breath sounds normal. No stridor. No respiratory distress. She has no wheezes. She has no rales.  Abdominal: Soft. Bowel sounds are normal. She exhibits no distension. There is no tenderness. There is no rebound and no guarding.  Musculoskeletal: She exhibits no edema and no tenderness.  Neurological: She is alert. She has normal strength. No cranial nerve deficit (no facial droop, extraocular movements intact, no slurred speech) or sensory deficit. She  exhibits normal muscle tone. She displays no seizure activity. Coordination normal.  Skin: Skin is warm and dry. No rash noted.  Psychiatric: She has a normal mood and affect.    ED Course  Procedures (including critical care time)  DIAGNOSTIC STUDIES: Oxygen Saturation is 100% on air room, normal by my interpretation.  COORDINATION OF CARE: 7:14 PM- Discussed obtaining a chest x-ray and blood work.  The patient agreed to the treatment plan.   Labs Review Labs Reviewed  CBC - Abnormal; Notable for the following:    RDW 17.2 (*)    All other components within normal limits  COMPREHENSIVE METABOLIC PANEL - Abnormal; Notable for the following:    Creatinine, Ser 1.40 (*)    GFR calc non Af Amer 37 (*)    GFR calc Af Amer 43 (*)    All other components within normal limits  TROPONIN I  TROPONIN I    Imaging Review Dg Chest 2 View  11/04/2013   CLINICAL DATA:  Chest pain.  EXAM: CHEST  2 VIEW  COMPARISON:  None.  FINDINGS: Cardiopericardial silhouette within normal limits. Mediastinal contours normal. Trachea midline. No airspace disease or effusion.  Osseous remodeling of the glenoid is compatible with known bony metastatic disease. Monitoring leads project over the chest.  IMPRESSION: No acute cardiopulmonary disease.  LEFT scapular metastatic disease.   Electronically Signed   By: Dereck Ligas M.D.   On: 11/04/2013 19:52     EKG Interpretation   Date/Time:  Friday November 04 2013 18:49:44 EDT Ventricular Rate:  76 PR Interval:  162 QRS Duration: 98 QT Interval:  380 QTC Calculation: 427 R Axis:   48 Text Interpretation:  Normal sinus rhythm Normal ECG No significant change  since last tracing Confirmed by Silvia Markuson  MD-J, Cumi Sanagustin ([id number]) on 11/04/2013  6:53:23 PM      MDM   Final diagnoses:  Chest pain, unspecified chest pain type    Atypical chest pain.  History of nl stress test in the recent past.  Two sets of cardiac enzymes normal.  Will dc home.  Follow up  with PCP  I personally performed the services described in this documentation, which was scribed in my presence.  The recorded information has been reviewed and is accurate.    Dorie Rank, MD 11/04/13 [phone]

## 2013-11-04 NOTE — ED Notes (Signed)
Chest pain since this am. She was seen by her MD today and had an abnormal EKG. She was told to come here.

## 2013-11-04 NOTE — ED Notes (Signed)
Pt discharged to home with husband. NAD.

## 2013-12-14 ENCOUNTER — Encounter (INDEPENDENT_AMBULATORY_CARE_PROVIDER_SITE_OTHER): Payer: Self-pay | Admitting: General Surgery

## 2013-12-14 NOTE — Progress Notes (Signed)
Patient ID: Cathy Lewis, female   DOB: 01/07/1946, 68 y.o.   MRN: [id number]  Myesha Stillion Buccellato 12/14/2013 9:23 AM Location: [facility] Patient #: [id number] DOB: 12/15/1945 Married / Language: English / Race: White Female History of Present Illness [name] MD; 12/14/2013 1:00 PM) Patient words: New pt. hernia.  The patient is a 68 year old female    Note:She is referred by [name] because of bilateral inguinal hernias. She is aware of the left inguinal hernia. It is causing her discomfort sometimes she has to manually reduce it. Clinically, she is unaware of the right inguinal hernia. Both hernias were noted on a recent CT scan of the abdomen and pelvis which was done because she has a history of left kidney cancer. Dr. Jacqulyn Ducking is her oncologist in [facility]. The left inguinal hernia contains fat and a small amount of large intestine. The right inguinal hernia is small and contained a small amount of fat. No difficulty with urination or constipation. No family history of hernia disease.  Other Problems Briant Cedar, CMA; 12/14/2013 9:23 AM) Arthritis Asthma Back Pain Cancer Diverticulosis Gastroesophageal Reflux Disease High blood pressure Hypercholesterolemia Inguinal Hernia Oophorectomy Bilateral. Other disease, cancer, significant illness  Past Surgical History Briant Cedar, [place]; 12/14/2013 9:23 AM) Appendectomy Colon Polyp Removal - Colonoscopy Foot Surgery Bilateral. Hysterectomy (not due to cancer) - Complete Knee Surgery Right. Laparoscopic Inguinal Hernia Surgery Right. Nephrectomy Left. Oral Surgery  Diagnostic Studies History Briant Cedar, [place]; 12/14/2013 9:23 AM) Colonoscopy within last year Mammogram within last year  Allergies Briant Cedar, [place]; 12/14/2013 9:26 AM) No Known Drug Allergies 12/14/2013  Medication History Briant Cedar, CMA; 12/14/2013 10:06 AM) Advair Diskus  (100-50MCG/DOSE Aero Pow Br Act, Inhalation) Active. AmLODIPine Besylate (5MG  Tablet, Oral) Active. Crestor (10MG  Tablet, Oral) Active. Dicyclomine HCl (20MG  Tablet, Oral) Active. Hydrochlorothiazide (25MG  Tablet, Oral) Active. Meclizine HCl (12.5MG  Tablet, Oral) Active. Meloxicam (7.5MG  Tablet, Oral) Active. Montelukast Sodium (10MG  Tablet, Oral) Active. Ondansetron HCl (8MG  Tablet, Oral) Active. Polyethylene Glycol 3350 (Oral) Active. Ranitidine HCl (150MG  Tablet, Oral) Active. Triamcinolone Acetonide (0.1% Cream, External) Active. Votrient (200MG  Tablet, Oral) Active. Calcium 600 (1500MG  Tablet, Oral) Active. PEG 3350/Electrolytes (240GM For Solution, Oral) Active. Magnesium Gluconate (250MG  Tablet, Oral) Active. Ventolin HFA (108 (90 Base)MCG/ACT Aerosol Soln, Inhalation) Active. Fluticasone Propionate (50MCG/ACT Suspension, Nasal) Active. Vitamin D3 (1000UNIT Capsule, Oral) Active. Probiotic (Oral) Active. Xgeva (120MG /1.7ML Solution, Subcutaneous) Active.  Social History Briant Cedar, [place]; 12/14/2013 9:23 AM) Caffeine use Coffee. No alcohol use No drug use Tobacco use Former smoker.  Family History Briant Cedar, [place]; 12/14/2013 9:23 AM) Arthritis Sister. Breast Cancer Family Members In General. Cancer Brother. Heart Disease Brother, Father, Sister. Heart disease in female family member before age 63 Hypertension Sister, Son. Ovarian Cancer Mother.  Pregnancy / Birth History Briant Cedar, CMA; 12/14/2013 9:23 AM) Age at menarche 80 years. Age of menopause >58 Contraceptive History Oral contraceptives. Gravida 3 Maternal age 77-20 Para 3     Review of Systems Briant Cedar CMA; 12/14/2013 9:23 AM) General Present- Appetite Loss. Not Present- Chills, Fatigue, Fever, Night Sweats, Weight Gain and Weight Loss. Skin Not Present- Change in Wart/Mole, Dryness, Hives, Jaundice, New Lesions, Non-Healing Wounds, Rash and  Ulcer. HEENT Not Present- Earache, Hearing Loss, Hoarseness, Nose Bleed, Oral Ulcers, Ringing in the Ears, Seasonal Allergies, Sinus Pain, Sore Throat, Visual Disturbances, Wears glasses/contact lenses and Yellow Eyes. Respiratory Not Present- Bloody sputum, Chronic Cough, Difficulty Breathing, Snoring and Wheezing. Breast Not Present- Breast Mass, Breast  Pain, Nipple Discharge and Skin Changes. Cardiovascular Present- Leg Cramps. Not Present- Chest Pain, Difficulty Breathing Lying Down, Palpitations, Rapid Heart Rate, Shortness of Breath and Swelling of Extremities. Gastrointestinal Present- Abdominal Pain, Bloating, Chronic diarrhea, Excessive gas, Indigestion and Nausea. Not Present- Bloody Stool, Change in Bowel Habits, Constipation, Difficulty Swallowing, Gets full quickly at meals, Hemorrhoids, Rectal Pain and Vomiting. Female Genitourinary Present- Painful Urination. Not Present- Frequency, Nocturia, Pelvic Pain and Urgency. Musculoskeletal Present- Back Pain and Joint Pain. Not Present- Joint Stiffness, Muscle Pain, Muscle Weakness and Swelling of Extremities. Neurological Not Present- Decreased Memory, Fainting, Headaches, Numbness, Seizures, Tingling, Tremor, Trouble walking and Weakness. Psychiatric Not Present- Anxiety, Bipolar, Change in Sleep Pattern, Depression, Fearful and Frequent crying. Endocrine Not Present- Cold Intolerance, Excessive Hunger, Hair Changes, Heat Intolerance, Hot flashes and New Diabetes. Hematology Present- Easy Bruising. Not Present- Excessive bleeding, Gland problems, HIV and Persistent Infections.  Vitals Briant Cedar CMA; 12/14/2013 9:27 AM) 12/14/2013 9:26 AM Weight: 132.13 lb Height: 65in Body Surface Area: 1.66 m Body Mass Index: 21.99 kg/m Temp.: 97.33F  Pulse: 63 (Regular)  BP: 138/74 (Sitting, Left Arm, Standard)     Physical Exam [name] MD; 12/14/2013 1:02 PM)  The physical exam findings are as  follows: Note:General: WDWN in NAD. Pleasant and cooperative.  HEENT: [place]/AT, no facial masses  EYES: EOMI, no icterus  CV: RRR, no murmur, no JVD.  CHEST: Breath sounds equal and clear. Respirations nonlabored.  ABDOMEN: Soft, nontender, nondistended, no masses, no organomegaly, periumbilical scar  GU: Bilateral inguinal bulges left larger than right. These spontaneously reduce in the supine position.  MUSCULOSKELETAL: FROM, good muscle tone, no edema, no venous stasis changes  SKIN: No jaundice or suspicious rashes.  NEUROLOGIC: Alert and oriented, answers questions appropriately, normal gait and station.  PSYCHIATRIC: Normal mood, affect , and behavior.    Assessment & Plan [name] MD; 12/14/2013 1:05 PM)  BILATERAL INGUINAL HERNIA WITHOUT OBSTRUCTION OR GANGRENE, RECURRENCE NOT SPECIFIED (550.92  K40.20) Impression: Left side is symptomatic and contains a loop of intestine when it is out. Right side is asymptomatic in fact she was unaware she had it. She is not a candidate for a laparoscopic TEPP given her previous periumbilical surgery. We discussed options of repairing the symptomatic side and monitoring the asymptomatic side versus bilateral repair. Because of the significantly increased pain and time for recovery from open bilateral inguinal hernia repairs in my experience, I recommend repairing the symptomatic side (left) at this time. If the right side became larger symptomatic, it could be repaired at a later date. The chemotherapeutic agent (Votrient) she takes for a kidney cancer is being held.  Plan: Open left inguinal hernia repair with mesh. I have explained the procedure, risks, and aftercare of inguinal hernia repair. Risks include but are not limited to bleeding, infection, wound problems, anesthesia, recurrence, bladder or intestine injury, urinary retention, chronic pain, mesh problems. She seems to understand and agrees with the plan.  [name], M.D.

## 2013-12-27 ENCOUNTER — Encounter (HOSPITAL_BASED_OUTPATIENT_CLINIC_OR_DEPARTMENT_OTHER): Payer: Self-pay | Admitting: *Deleted

## 2013-12-27 NOTE — Progress Notes (Signed)
Called for ekg-stress/echo dr Nadyne Coombes sees due to family hx-to come in for labs

## 2013-12-30 ENCOUNTER — Encounter (HOSPITAL_BASED_OUTPATIENT_CLINIC_OR_DEPARTMENT_OTHER)
Admission: RE | Admit: 2013-12-30 | Discharge: 2013-12-30 | Disposition: A | Discharge status: Home / Self Care | Payer: Medicare Other | Source: Ambulatory Visit | Attending: General Surgery | Admitting: General Surgery

## 2013-12-30 DIAGNOSIS — Z87891 Personal history of nicotine dependence: Secondary | ICD-10-CM | POA: Diagnosis not present

## 2013-12-30 DIAGNOSIS — E780 Pure hypercholesterolemia: Secondary | ICD-10-CM | POA: Diagnosis not present

## 2013-12-30 DIAGNOSIS — Z888 Allergy status to other drugs, medicaments and biological substances status: Secondary | ICD-10-CM | POA: Diagnosis not present

## 2013-12-30 DIAGNOSIS — I10 Essential (primary) hypertension: Secondary | ICD-10-CM | POA: Diagnosis not present

## 2013-12-30 DIAGNOSIS — K219 Gastro-esophageal reflux disease without esophagitis: Secondary | ICD-10-CM | POA: Diagnosis not present

## 2013-12-30 DIAGNOSIS — J45909 Unspecified asthma, uncomplicated: Secondary | ICD-10-CM | POA: Diagnosis not present

## 2013-12-30 DIAGNOSIS — C649 Malignant neoplasm of unspecified kidney, except renal pelvis: Secondary | ICD-10-CM | POA: Diagnosis not present

## 2013-12-30 DIAGNOSIS — Z86010 Personal history of colonic polyps: Secondary | ICD-10-CM | POA: Diagnosis not present

## 2013-12-30 DIAGNOSIS — K4020 Bilateral inguinal hernia, without obstruction or gangrene, not specified as recurrent: Secondary | ICD-10-CM | POA: Diagnosis present

## 2013-12-30 DIAGNOSIS — M1990 Unspecified osteoarthritis, unspecified site: Secondary | ICD-10-CM | POA: Diagnosis not present

## 2013-12-30 LAB — COMPREHENSIVE METABOLIC PANEL
ALK PHOS: 64 U/L (ref 39–117)
ALT: 13 U/L (ref 0–35)
AST: 20 U/L (ref 0–37)
Albumin: 3.9 g/dL (ref 3.5–5.2)
Anion gap: 13 (ref 5–15)
BUN: 20 mg/dL (ref 6–23)
CALCIUM: 9.4 mg/dL (ref 8.4–10.5)
CO2: 28 meq/L (ref 19–32)
Chloride: 100 mEq/L (ref 96–112)
Creatinine, Ser: 1.68 mg/dL — ABNORMAL HIGH (ref 0.50–1.10)
GFR, EST AFRICAN AMERICAN: 34 mL/min — AB (ref >90)
GFR, EST NON AFRICAN AMERICAN: 30 mL/min — AB (ref >90)
GLUCOSE: 108 mg/dL — AB (ref 70–99)
POTASSIUM: 3.7 meq/L (ref 3.7–5.3)
Sodium: 141 mEq/L (ref 137–147)
TOTAL PROTEIN: 7 g/dL (ref 6.0–8.3)
Total Bilirubin: 0.3 mg/dL (ref 0.3–1.2)

## 2013-12-30 LAB — CBC WITH DIFFERENTIAL/PLATELET
Basophils Absolute: 0 10*3/uL (ref 0.0–0.1)
Basophils Relative: 0 % (ref 0–1)
EOS ABS: 0 10*3/uL (ref 0.0–0.7)
Eosinophils Relative: 1 % (ref 0–5)
HCT: 36.8 % (ref 36.0–46.0)
HEMOGLOBIN: 11.9 g/dL — AB (ref 12.0–15.0)
LYMPHS ABS: 1.5 10*3/uL (ref 0.7–4.0)
LYMPHS PCT: 28 % (ref 12–46)
MCH: 32.2 pg (ref 26.0–34.0)
MCHC: 32.3 g/dL (ref 30.0–36.0)
MCV: 99.5 fL (ref 78.0–100.0)
MONOS PCT: 8 % (ref 3–12)
Monocytes Absolute: 0.4 10*3/uL (ref 0.1–1.0)
Neutro Abs: 3.2 10*3/uL (ref 1.7–7.7)
Neutrophils Relative %: 63 % (ref 43–77)
Platelets: 198 10*3/uL (ref 150–400)
RBC: 3.7 MIL/uL — AB (ref 3.87–5.11)
RDW: 16 % — ABNORMAL HIGH (ref 11.5–15.5)
WBC: 5.2 10*3/uL (ref 4.0–10.5)

## 2013-12-30 LAB — PROTIME-INR
INR: 0.93 (ref 0.00–1.49)
Prothrombin Time: 12.6 seconds (ref 11.6–15.2)

## 2014-01-03 ENCOUNTER — Encounter (HOSPITAL_BASED_OUTPATIENT_CLINIC_OR_DEPARTMENT_OTHER): Payer: Self-pay | Admitting: *Deleted

## 2014-01-03 ENCOUNTER — Ambulatory Visit (HOSPITAL_BASED_OUTPATIENT_CLINIC_OR_DEPARTMENT_OTHER)
Admission: RE | Admit: 2014-01-03 | Discharge: 2014-01-03 | Disposition: A | Discharge status: Home / Self Care | Payer: Medicare Other | Source: Ambulatory Visit | Attending: General Surgery | Admitting: General Surgery

## 2014-01-03 ENCOUNTER — Ambulatory Visit (HOSPITAL_BASED_OUTPATIENT_CLINIC_OR_DEPARTMENT_OTHER): Payer: Medicare Other | Admitting: Anesthesiology

## 2014-01-03 ENCOUNTER — Encounter (HOSPITAL_BASED_OUTPATIENT_CLINIC_OR_DEPARTMENT_OTHER)
Admission: RE | Disposition: A | Discharge status: Home / Self Care | Payer: Self-pay | Source: Ambulatory Visit | Attending: General Surgery

## 2014-01-03 DIAGNOSIS — Z86010 Personal history of colonic polyps: Secondary | ICD-10-CM | POA: Insufficient documentation

## 2014-01-03 DIAGNOSIS — K4020 Bilateral inguinal hernia, without obstruction or gangrene, not specified as recurrent: Secondary | ICD-10-CM | POA: Insufficient documentation

## 2014-01-03 DIAGNOSIS — J45909 Unspecified asthma, uncomplicated: Secondary | ICD-10-CM | POA: Diagnosis not present

## 2014-01-03 DIAGNOSIS — Z87891 Personal history of nicotine dependence: Secondary | ICD-10-CM | POA: Insufficient documentation

## 2014-01-03 DIAGNOSIS — M1990 Unspecified osteoarthritis, unspecified site: Secondary | ICD-10-CM | POA: Insufficient documentation

## 2014-01-03 DIAGNOSIS — K219 Gastro-esophageal reflux disease without esophagitis: Secondary | ICD-10-CM | POA: Insufficient documentation

## 2014-01-03 DIAGNOSIS — I10 Essential (primary) hypertension: Secondary | ICD-10-CM | POA: Insufficient documentation

## 2014-01-03 DIAGNOSIS — E780 Pure hypercholesterolemia: Secondary | ICD-10-CM | POA: Insufficient documentation

## 2014-01-03 DIAGNOSIS — C649 Malignant neoplasm of unspecified kidney, except renal pelvis: Secondary | ICD-10-CM | POA: Insufficient documentation

## 2014-01-03 DIAGNOSIS — Z888 Allergy status to other drugs, medicaments and biological substances status: Secondary | ICD-10-CM | POA: Insufficient documentation

## 2014-01-03 HISTORY — PX: INSERTION OF MESH: SHX5868

## 2014-01-03 HISTORY — DX: Other complications of anesthesia, initial encounter: T88.59XA

## 2014-01-03 HISTORY — PX: INGUINAL HERNIA REPAIR: SHX194

## 2014-01-03 HISTORY — DX: Adverse effect of unspecified anesthetic, initial encounter: T41.45XA

## 2014-01-03 LAB — POCT HEMOGLOBIN-HEMACUE: Hemoglobin: 11.3 g/dL — ABNORMAL LOW (ref 12.0–15.0)

## 2014-01-03 SURGERY — REPAIR, HERNIA, INGUINAL, ADULT
Anesthesia: General | Laterality: Left

## 2014-01-03 MED ORDER — LIDOCAINE HCL (CARDIAC) 20 MG/ML IV SOLN
INTRAVENOUS | Status: DC | PRN
  Administered 2014-01-03: 30 mg via INTRAVENOUS

## 2014-01-03 MED ORDER — SODIUM CHLORIDE 0.9 % IJ SOLN: 3.0000 mL | INTRAMUSCULAR | Status: DC | PRN

## 2014-01-03 MED ORDER — BUPIVACAINE HCL (PF) 0.5 % IJ SOLN
INTRAMUSCULAR | Status: DC | PRN
  Administered 2014-01-03: 16 mL
  Administered 2014-01-03: 7 mL

## 2014-01-03 MED ORDER — DEXAMETHASONE SODIUM PHOSPHATE 4 MG/ML IJ SOLN
INTRAMUSCULAR | Status: DC | PRN
  Administered 2014-01-03: 8 mg via INTRAVENOUS

## 2014-01-03 MED ORDER — OXYCODONE HCL 5 MG PO TABS: 5.0000 mg | ORAL_TABLET | ORAL | Status: DC | PRN

## 2014-01-03 MED ORDER — FENTANYL CITRATE 0.05 MG/ML IJ SOLN
INTRAMUSCULAR | Status: AC
  Filled 2014-01-03: qty 6

## 2014-01-03 MED ORDER — LIDOCAINE HCL (PF) 1 % IJ SOLN
INTRAMUSCULAR | Status: AC
  Filled 2014-01-03: qty 30

## 2014-01-03 MED ORDER — EPHEDRINE SULFATE 50 MG/ML IJ SOLN
INTRAMUSCULAR | Status: DC | PRN
  Administered 2014-01-03: 10 mg via INTRAVENOUS

## 2014-01-03 MED ORDER — OXYCODONE HCL 5 MG PO TABS: 5.0000 mg | ORAL_TABLET | Freq: Once | ORAL | Status: DC | PRN

## 2014-01-03 MED ORDER — BUPIVACAINE-EPINEPHRINE (PF) 0.5% -1:200000 IJ SOLN
INTRAMUSCULAR | Status: DC | PRN
  Administered 2014-01-03: 30 mL

## 2014-01-03 MED ORDER — FENTANYL CITRATE 0.05 MG/ML IJ SOLN
INTRAMUSCULAR | Status: DC | PRN
  Administered 2014-01-03: 25 ug via INTRAVENOUS

## 2014-01-03 MED ORDER — PROMETHAZINE HCL 25 MG/ML IJ SOLN: 6.2500 mg | INTRAMUSCULAR | Status: DC | PRN

## 2014-01-03 MED ORDER — MIDAZOLAM HCL 2 MG/2ML IJ SOLN
INTRAMUSCULAR | Status: AC
  Filled 2014-01-03: qty 2

## 2014-01-03 MED ORDER — MIDAZOLAM HCL 2 MG/2ML IJ SOLN
1.0000 mg | INTRAMUSCULAR | Status: DC | PRN
Start: 2014-01-03 — End: 2014-01-03
  Administered 2014-01-03: 1 mg via INTRAVENOUS

## 2014-01-03 MED ORDER — CEFAZOLIN SODIUM-DEXTROSE 2-3 GM-% IV SOLR
2.0000 g | INTRAVENOUS | Status: AC
  Administered 2014-01-03: 2 g via INTRAVENOUS

## 2014-01-03 MED ORDER — BUPIVACAINE HCL (PF) 0.5 % IJ SOLN
INTRAMUSCULAR | Status: AC
  Filled 2014-01-03: qty 30

## 2014-01-03 MED ORDER — FENTANYL CITRATE 0.05 MG/ML IJ SOLN
INTRAMUSCULAR | Status: AC
  Filled 2014-01-03: qty 2

## 2014-01-03 MED ORDER — HYDROMORPHONE HCL 1 MG/ML IJ SOLN: 0.2500 mg | INTRAMUSCULAR | Status: DC | PRN

## 2014-01-03 MED ORDER — PROPOFOL 10 MG/ML IV BOLUS
INTRAVENOUS | Status: DC | PRN
  Administered 2014-01-03: 150 mg via INTRAVENOUS

## 2014-01-03 MED ORDER — CEFAZOLIN SODIUM-DEXTROSE 2-3 GM-% IV SOLR
INTRAVENOUS | Status: AC
  Filled 2014-01-03: qty 50

## 2014-01-03 MED ORDER — ACETAMINOPHEN 325 MG PO TABS
650.0000 mg | ORAL_TABLET | ORAL | Status: DC | PRN
Start: 2014-01-03 — End: 2014-01-03

## 2014-01-03 MED ORDER — ONDANSETRON HCL 4 MG/2ML IJ SOLN
INTRAMUSCULAR | Status: DC | PRN
  Administered 2014-01-03: 4 mg via INTRAVENOUS

## 2014-01-03 MED ORDER — ONDANSETRON HCL 4 MG PO TABS: 4.0000 mg | ORAL_TABLET | Freq: Four times a day (QID) | ORAL | Status: DC | PRN

## 2014-01-03 MED ORDER — MORPHINE SULFATE 2 MG/ML IJ SOLN: 2.0000 mg | INTRAMUSCULAR | Status: DC | PRN

## 2014-01-03 MED ORDER — SODIUM BICARBONATE 4 % IV SOLN
INTRAVENOUS | Status: AC
  Filled 2014-01-03: qty 5

## 2014-01-03 MED ORDER — LACTATED RINGERS IV SOLN
INTRAVENOUS | Status: DC
  Administered 2014-01-03 (×2): via INTRAVENOUS

## 2014-01-03 MED ORDER — OXYCODONE HCL 5 MG/5ML PO SOLN: 5.0000 mg | Freq: Once | ORAL | Status: DC | PRN

## 2014-01-03 MED ORDER — FENTANYL CITRATE 0.05 MG/ML IJ SOLN
50.0000 ug | INTRAMUSCULAR | Status: DC | PRN
  Administered 2014-01-03: 50 ug via INTRAVENOUS

## 2014-01-03 MED ORDER — ACETAMINOPHEN 650 MG RE SUPP: 650.0000 mg | RECTAL | Status: DC | PRN

## 2014-01-03 SURGICAL SUPPLY — 53 items
APL SKNCLS STERI-STRIP NONHPOA (GAUZE/BANDAGES/DRESSINGS) ×1
BENZOIN TINCTURE PRP APPL 2/3 (GAUZE/BANDAGES/DRESSINGS) ×3 IMPLANT
BLADE CLIPPER SURG (BLADE) ×2 IMPLANT
BLADE SURG 10 STRL SS (BLADE) ×3 IMPLANT
BLADE SURG 15 STRL LF DISP TIS (BLADE) ×1 IMPLANT
BLADE SURG 15 STRL SS (BLADE) ×3
CANISTER SUCT 1200ML W/VALVE (MISCELLANEOUS) IMPLANT
CHLORAPREP W/TINT 26ML (MISCELLANEOUS) ×3 IMPLANT
CLEANER CAUTERY TIP 5X5 PAD (MISCELLANEOUS) ×1 IMPLANT
CLOSURE WOUND 1/2 X4 (GAUZE/BANDAGES/DRESSINGS) ×1
COVER BACK TABLE 60X90IN (DRAPES) ×3 IMPLANT
COVER MAYO STAND STRL (DRAPES) ×3 IMPLANT
DECANTER SPIKE VIAL GLASS SM (MISCELLANEOUS) ×1 IMPLANT
DRAIN PENROSE 1/2X12 LTX STRL (WOUND CARE) ×1 IMPLANT
DRAPE INCISE IOBAN 66X45 STRL (DRAPES) ×3 IMPLANT
DRAPE LAPAROTOMY T 102X78X121 (DRAPES) ×3 IMPLANT
DRAPE UTILITY XL STRL (DRAPES) ×3 IMPLANT
DRSG TEGADERM 2-3/8X2-3/4 SM (GAUZE/BANDAGES/DRESSINGS) IMPLANT
DRSG TEGADERM 4X4.75 (GAUZE/BANDAGES/DRESSINGS) ×3 IMPLANT
ELECT REM PT RETURN 9FT ADLT (ELECTROSURGICAL) ×3
ELECTRODE REM PT RTRN 9FT ADLT (ELECTROSURGICAL) ×1 IMPLANT
GAUZE SPONGE 4X4 16PLY XRAY LF (GAUZE/BANDAGES/DRESSINGS) IMPLANT
GLOVE BIOGEL PI IND STRL 7.0 (GLOVE) IMPLANT
GLOVE BIOGEL PI IND STRL 8.5 (GLOVE) ×1 IMPLANT
GLOVE BIOGEL PI INDICATOR 7.0 (GLOVE) ×2
GLOVE BIOGEL PI INDICATOR 8.5 (GLOVE) ×2
GLOVE ECLIPSE 6.5 STRL STRAW (GLOVE) ×2 IMPLANT
GLOVE ECLIPSE 8.0 STRL XLNG CF (GLOVE) ×3 IMPLANT
GOWN STRL REUS W/ TWL LRG LVL3 (GOWN DISPOSABLE) ×1 IMPLANT
GOWN STRL REUS W/TWL LRG LVL3 (GOWN DISPOSABLE) ×3
MESH HERNIA 3X6 (Mesh General) ×2 IMPLANT
NDL HYPO 25X1 1.5 SAFETY (NEEDLE) ×1 IMPLANT
NEEDLE HYPO 25X1 1.5 SAFETY (NEEDLE) ×3 IMPLANT
NS IRRIG 1000ML POUR BTL (IV SOLUTION) IMPLANT
PACK BASIN DAY SURGERY FS (CUSTOM PROCEDURE TRAY) ×3 IMPLANT
PAD CLEANER CAUTERY TIP 5X5 (MISCELLANEOUS) ×2
PENCIL BUTTON HOLSTER BLD 10FT (ELECTRODE) ×3 IMPLANT
SLEEVE SCD COMPRESS KNEE MED (MISCELLANEOUS) IMPLANT
SPONGE INTESTINAL PEANUT (DISPOSABLE) IMPLANT
STRIP CLOSURE SKIN 1/2X4 (GAUZE/BANDAGES/DRESSINGS) ×2 IMPLANT
SUT MON AB 4-0 PC3 18 (SUTURE) ×3 IMPLANT
SUT PROLENE 2 0 CT2 30 (SUTURE) ×6 IMPLANT
SUT VIC AB 2-0 SH 18 (SUTURE) ×3 IMPLANT
SUT VIC AB 3-0 SH 27 (SUTURE) ×3
SUT VIC AB 3-0 SH 27X BRD (SUTURE) ×1 IMPLANT
SUT VICRYL AB 2 0 TIE (SUTURE) IMPLANT
SUT VICRYL AB 2 0 TIES (SUTURE)
SYR CONTROL 10ML LL (SYRINGE) ×3 IMPLANT
TOWEL OR 17X24 6PK STRL BLUE (TOWEL DISPOSABLE) ×6 IMPLANT
TOWEL OR NON WOVEN STRL DISP B (DISPOSABLE) ×3 IMPLANT
TUBE CONNECTING 20'X1/4 (TUBING)
TUBE CONNECTING 20X1/4 (TUBING) IMPLANT
YANKAUER SUCT BULB TIP NO VENT (SUCTIONS) IMPLANT

## 2014-01-03 NOTE — Anesthesia Postprocedure Evaluation (Signed)
Anesthesia Post Note  Patient: Cathy Lewis  Procedure(s) Performed: Procedure(s) (LRB): LEFT INGUINAL HERNIA REPAIR  (Left) INSERTION OF MESH (Left)  Anesthesia type: general  Patient location: PACU  Post pain: Pain level controlled  Post assessment: Patient's Cardiovascular Status Stable  Last Vitals:  Filed Vitals:   01/03/14 1530  BP: 134/78  Pulse: 91  Temp:   Resp: 9    Post vital signs: Reviewed and stable  Level of consciousness: sedated  Complications: No apparent anesthesia complications

## 2014-01-03 NOTE — H&P (View-Only) (Signed)
Patient ID: Cathy Lewis, female   DOB: 03/29/1945, 68 y.o.   MRN: [id number]  Cathy Lewis 12/14/2013 9:23 AM Location: [facility] Patient #: [id number] DOB: [birth year] Married / Language: English / Race: White Female History of Present Illness [name] MD; 12/14/2013 1:00 PM) Patient words: New pt. hernia.  The patient is a 68 year old female    Note:She is referred by [name] because of bilateral inguinal hernias. She is aware of the left inguinal hernia. It is causing her discomfort sometimes she has to manually reduce it. Clinically, she is unaware of the right inguinal hernia. Both hernias were noted on a recent CT scan of the abdomen and pelvis which was done because she has a history of left kidney cancer. Dr. Jacqulyn Ducking is her oncologist in [facility]. The left inguinal hernia contains fat and a small amount of large intestine. The right inguinal hernia is small and contained a small amount of fat. No difficulty with urination or constipation. No family history of hernia disease.  Other Problems Briant Cedar, CMA; 12/14/2013 9:23 AM) Arthritis Asthma Back Pain Cancer Diverticulosis Gastroesophageal Reflux Disease High blood pressure Hypercholesterolemia Inguinal Hernia Oophorectomy Bilateral. Other disease, cancer, significant illness  Past Surgical History Briant Cedar, [place]; 12/14/2013 9:23 AM) Appendectomy Colon Polyp Removal - Colonoscopy Foot Surgery Bilateral. Hysterectomy (not due to cancer) - Complete Knee Surgery Right. Laparoscopic Inguinal Hernia Surgery Right. Nephrectomy Left. Oral Surgery  Diagnostic Studies History Briant Cedar, [place]; 12/14/2013 9:23 AM) Colonoscopy within last year Mammogram within last year  Allergies Briant Cedar, [place]; 12/14/2013 9:26 AM) No Known Drug Allergies 12/14/2013  Medication History Briant Cedar, CMA; 12/14/2013 10:06 AM) Advair Diskus  (100-50MCG/DOSE Aero Pow Br Act, Inhalation) Active. AmLODIPine Besylate (5MG  Tablet, Oral) Active. Crestor (10MG  Tablet, Oral) Active. Dicyclomine HCl (20MG  Tablet, Oral) Active. Hydrochlorothiazide (25MG  Tablet, Oral) Active. Meclizine HCl (12.5MG  Tablet, Oral) Active. Meloxicam (7.5MG  Tablet, Oral) Active. Montelukast Sodium (10MG  Tablet, Oral) Active. Ondansetron HCl (8MG  Tablet, Oral) Active. Polyethylene Glycol 3350 (Oral) Active. Ranitidine HCl (150MG  Tablet, Oral) Active. Triamcinolone Acetonide (0.1% Cream, External) Active. Votrient (200MG  Tablet, Oral) Active. Calcium 600 (1500MG  Tablet, Oral) Active. PEG 3350/Electrolytes (240GM For Solution, Oral) Active. Magnesium Gluconate (250MG  Tablet, Oral) Active. Ventolin HFA (108 (90 Base)MCG/ACT Aerosol Soln, Inhalation) Active. Fluticasone Propionate (50MCG/ACT Suspension, Nasal) Active. Vitamin D3 (1000UNIT Capsule, Oral) Active. Probiotic (Oral) Active. Xgeva (120MG /1.7ML Solution, Subcutaneous) Active.  Social History Briant Cedar, [place]; 12/14/2013 9:23 AM) Caffeine use Coffee. No alcohol use No drug use Tobacco use Former smoker.  Family History Briant Cedar, [place]; 12/14/2013 9:23 AM) Arthritis Sister. Breast Cancer Family Members In General. Cancer Brother. Heart Disease Brother, Father, Sister. Heart disease in female family member before age 73 Hypertension Sister, Son. Ovarian Cancer Mother.  Pregnancy / Birth History Briant Cedar, CMA; 12/14/2013 9:23 AM) Age at menarche 69 years. Age of menopause >58 Contraceptive History Oral contraceptives. Gravida 3 Maternal age 34-20 Para 3     Review of Systems Briant Cedar CMA; 12/14/2013 9:23 AM) General Present- Appetite Loss. Not Present- Chills, Fatigue, Fever, Night Sweats, Weight Gain and Weight Loss. Skin Not Present- Change in Wart/Mole, Dryness, Hives, Jaundice, New Lesions, Non-Healing Wounds, Rash and  Ulcer. HEENT Not Present- Earache, Hearing Loss, Hoarseness, Nose Bleed, Oral Ulcers, Ringing in the Ears, Seasonal Allergies, Sinus Pain, Sore Throat, Visual Disturbances, Wears glasses/contact lenses and Yellow Eyes. Respiratory Not Present- Bloody sputum, Chronic Cough, Difficulty Breathing, Snoring and Wheezing. Breast Not Present- Breast Mass, Breast  Pain, Nipple Discharge and Skin Changes. Cardiovascular Present- Leg Cramps. Not Present- Chest Pain, Difficulty Breathing Lying Down, Palpitations, Rapid Heart Rate, Shortness of Breath and Swelling of Extremities. Gastrointestinal Present- Abdominal Pain, Bloating, Chronic diarrhea, Excessive gas, Indigestion and Nausea. Not Present- Bloody Stool, Change in Bowel Habits, Constipation, Difficulty Swallowing, Gets full quickly at meals, Hemorrhoids, Rectal Pain and Vomiting. Female Genitourinary Present- Painful Urination. Not Present- Frequency, Nocturia, Pelvic Pain and Urgency. Musculoskeletal Present- Back Pain and Joint Pain. Not Present- Joint Stiffness, Muscle Pain, Muscle Weakness and Swelling of Extremities. Neurological Not Present- Decreased Memory, Fainting, Headaches, Numbness, Seizures, Tingling, Tremor, Trouble walking and Weakness. Psychiatric Not Present- Anxiety, Bipolar, Change in Sleep Pattern, Depression, Fearful and Frequent crying. Endocrine Not Present- Cold Intolerance, Excessive Hunger, Hair Changes, Heat Intolerance, Hot flashes and New Diabetes. Hematology Present- Easy Bruising. Not Present- Excessive bleeding, Gland problems, HIV and Persistent Infections.  Vitals Briant Cedar CMA; 12/14/2013 9:27 AM) 12/14/2013 9:26 AM Weight: 132.13 lb Height: 65in Body Surface Area: 1.66 m Body Mass Index: 21.99 kg/m Temp.: 97.28F  Pulse: 63 (Regular)  BP: 138/74 (Sitting, Left Arm, Standard)     Physical Exam [name] MD; 12/14/2013 1:02 PM)  The physical exam findings are as  follows: Note:General: WDWN in NAD. Pleasant and cooperative.  HEENT: [place]/AT, no facial masses  EYES: EOMI, no icterus  CV: RRR, no murmur, no JVD.  CHEST: Breath sounds equal and clear. Respirations nonlabored.  ABDOMEN: Soft, nontender, nondistended, no masses, no organomegaly, periumbilical scar  GU: Bilateral inguinal bulges left larger than right. These spontaneously reduce in the supine position.  MUSCULOSKELETAL: FROM, good muscle tone, no edema, no venous stasis changes  SKIN: No jaundice or suspicious rashes.  NEUROLOGIC: Alert and oriented, answers questions appropriately, normal gait and station.  PSYCHIATRIC: Normal mood, affect , and behavior.    Assessment & Plan [name] MD; 12/14/2013 1:05 PM)  BILATERAL INGUINAL HERNIA WITHOUT OBSTRUCTION OR GANGRENE, RECURRENCE NOT SPECIFIED (550.92  K40.20) Impression: Left side is symptomatic and contains a loop of intestine when it is out. Right side is asymptomatic in fact she was unaware she had it. She is not a candidate for a laparoscopic TEPP given her previous periumbilical surgery. We discussed options of repairing the symptomatic side and monitoring the asymptomatic side versus bilateral repair. Because of the significantly increased pain and time for recovery from open bilateral inguinal hernia repairs in my experience, I recommend repairing the symptomatic side (left) at this time. If the right side became larger symptomatic, it could be repaired at a later date. The chemotherapeutic agent (Votrient) she takes for a kidney cancer is being held.  Plan: Open left inguinal hernia repair with mesh. I have explained the procedure, risks, and aftercare of inguinal hernia repair. Risks include but are not limited to bleeding, infection, wound problems, anesthesia, recurrence, bladder or intestine injury, urinary retention, chronic pain, mesh problems. She seems to understand and agrees with the plan.  [name], M.D.

## 2014-01-03 NOTE — Progress Notes (Signed)
AssistedDr. Massagee with left, ultrasound guided, transabdominal plane block. Side rails up, monitors on throughout procedure. See vital signs in flow sheet. Tolerated Procedure well.  

## 2014-01-03 NOTE — Anesthesia Procedure Notes (Addendum)
Anesthesia Regional Block:  TAP block  Pre-Anesthetic Checklist: ,, timeout performed, Correct Patient, Correct Site, Correct Laterality, Correct Procedure, Correct Position, site marked, Risks and benefits discussed, Surgical consent,  Pre-op evaluation,  At surgeon's request   Prep: chloraprep       Needles:   Needle Type: Echogenic Needle     Needle Length: 9cm 9 cm Needle Gauge: 21 and 21 G  Needle insertion depth: 4 cm   Additional Needles:  Procedures: ultrasound guided (picture in chart) TAP block Narrative:  Start time: 01/03/2014 12:50 PM End time: 01/03/2014 1:05 PM  Performed by: Personally   Additional Notes: Tolerated well   Procedure Name: LMA Insertion Date/Time: 01/03/2014 1:56 PM Performed by: Copelan Maultsby Pre-anesthesia Checklist: Patient identified, Emergency Drugs available, Suction available and Patient being monitored Patient Re-evaluated:Patient Re-evaluated prior to inductionOxygen Delivery Method: Circle System Utilized Preoxygenation: Pre-oxygenation with 100% oxygen Intubation Type: IV induction Ventilation: Mask ventilation without difficulty LMA: LMA inserted LMA Size: 4.0 Number of attempts: 1 Airway Equipment and Method: bite block Placement Confirmation: positive ETCO2 Tube secured with: Tape Dental Injury: Teeth and Oropharynx as per pre-operative assessment

## 2014-01-03 NOTE — Anesthesia Preprocedure Evaluation (Addendum)
Anesthesia Evaluation  Patient identified by MRN, date of birth, ID band Patient awake    Reviewed: Allergy & Precautions, H&P , NPO status , Patient's Chart, lab work & pertinent test results  Airway Mallampati: I       Dental   Pulmonary asthma , former smoker,  breath sounds clear to auscultation        Cardiovascular hypertension, Rhythm:Regular Rate:Normal     Neuro/Psych    GI/Hepatic GERD-  ,  Endo/Other    Renal/GU      Musculoskeletal  (+) Arthritis -,   Abdominal   Peds  Hematology   Anesthesia Other Findings   Reproductive/Obstetrics                            Anesthesia Physical Anesthesia Plan  ASA: II  Anesthesia Plan: General   Post-op Pain Management:    Induction: Intravenous  Airway Management Planned: LMA  Additional Equipment:   Intra-op Plan:   Post-operative Plan: Extubation in OR  Informed Consent: I have reviewed the patients History and Physical, chart, labs and discussed the procedure including the risks, benefits and alternatives for the proposed anesthesia with the patient or authorized representative who has indicated his/her understanding and acceptance.   Dental advisory given  Plan Discussed with: CRNA and Surgeon  Anesthesia Plan Comments:         Anesthesia Quick Evaluation

## 2014-01-03 NOTE — Interval H&P Note (Signed)
History and Physical Interval Note:  01/03/2014 1:32 PM  Cathy Lewis  has presented today for surgery, with the diagnosis of left inguinal herina  The various methods of treatment have been discussed with the patient and family. After consideration of risks, benefits and other options for treatment, the patient has consented to  Procedure(s): LEFT INGUINAL HERNIA REPAIR  (Left) INSERTION OF MESH (Left) as a surgical intervention .  The patient's history has been reviewed, patient examined, no change in status, stable for surgery.  I have reviewed the patient's chart and labs.  Questions were answered to the patient's satisfaction.     Taelar Gronewold [name]

## 2014-01-03 NOTE — Op Note (Signed)
Preoperative diagnosis:  Left inguinal hernia.  Postoperative diagnosis:  Same  Procedure:  Left inguinal hernia repair with mesh.  Surgeon:  [name], M.D.  Anesthesia:  General/LMA with local ([place]).  Indication:  This is a 68 year old female with bilateral inguinal hernias. The left side is clinically obvious and symptomatic. The right side is very small. She would not know she had except a CT scan had shown it. She now presents for left inguinal hernia repair with mesh.  Technique:  She was seen in the holding room and the left groin was marked with my initials. She was brought to the operating, placed supine on the operating table, and the anesthetic was administered by the anesthesiologist. The hair in the groin area was clipped as was felt to be necessary. This area was then sterilely prepped and draped.  Local anesthetic was infiltrated in the superficial and deep tissues in the left groin.  An incision was made through the skin and subcutaneous tissue until the external oblique aponeurosis was identified.  A defect was noted in the external oblique aponeurosis and some adipose tissue was protruding through it.  Local anesthetic was infiltrated deep to the external oblique aponeurosis. The external oblique aponeurosis was divided through the external ring medially and back toward the anterior superior iliac spine laterally. Using blunt dissection, the shelving edge of the inguinal ligament was identified inferiorly and the internal oblique aponeurosis and muscle were identified superiorly. The ilioinguinal nerve was identified and preserved.  The round ligament was isolated and a posterior window was made around it. A significant amount of extraperitoneal fat was herniated through the internal ring and extending over into the direct space as well. This was separated from the round ligament both medially and laterally. This was then reduced and I approximated the attenuated  transversalis fascia in this area using 2-0 Vicryl suture. I then isolated the area of the round ligament near the internal ring and dissected a indirect sac free from its. It was reduced back into the internal ring.   A piece of 3" x 6" polypropylene mesh was brought into the field and anchored 1-2 cm medial to the pubic tubercle with 2-0 Prolene suture. The inferior aspect of the mesh was anchored to the shelving edge of the inguinal ligament with running 2-0 Prolene suture to a level 1-2 cm lateral to the internal ring. A slit was cut in the mesh creating 2 tails. These were wrapped around the round ligament. The superior aspect of the mesh was anchored to the internal oblique aponeurosis and muscle with interrupted 2-0 Vicryl sutures. The 2 tails of the mesh were then crossed creating a new internal ring and were anchored to the shelving edge of the inguinal ligament with 2-0 Prolene suture.  The lateral aspect of the mesh was then tucked deep to the external oblique aponeurosis.  The wound was inspected and hemostasis was adequate. The external oblique aponeurosis was then closed over the mesh with running 3-0 Vicryl suture. The subcutaneous tissue was closed with running 3-0 Vicryl suture. The skin closed with a running 4-0 Monocryl subcuticular stitch.  Steri-Strips and a sterile dressing were applied.  The procedure was well-tolerated without any apparent complications and she was taken to the recovery room in satisfactory condition.

## 2014-01-03 NOTE — Transfer of Care (Signed)
Immediate Anesthesia Transfer of Care Note  Patient: Cathy Lewis  Procedure(s) Performed: Procedure(s): LEFT INGUINAL HERNIA REPAIR  (Left) INSERTION OF MESH (Left)  Patient Location: PACU  Anesthesia Type:GA combined with regional for post-op pain  Level of Consciousness: awake, alert , oriented and patient cooperative  Airway & Oxygen Therapy: Patient Spontanous Breathing and Patient connected to face mask oxygen  Post-op Assessment: Report given to PACU RN and Post -op Vital signs reviewed and stable  Post vital signs: Reviewed and stable  Complications: No apparent anesthesia complications

## 2014-01-03 NOTE — Discharge Instructions (Addendum)
[facility] _______[facility]   INGUINAL HERNIA REPAIR: POST OP INSTRUCTIONS  Always review your discharge instruction sheet given to you by the facility where your surgery was performed. IF YOU HAVE DISABILITY OR FAMILY LEAVE FORMS, YOU MUST BRING THEM TO THE OFFICE FOR PROCESSING.   DO NOT GIVE THEM TO YOUR DOCTOR.  1. A  prescription for pain medication may be given to you upon discharge.  Take your pain medication as prescribed, if needed.  If narcotic pain medicine is not needed, then you may take acetaminophen (Tylenol) or ibuprofen (Advil) as needed. 2. Take your usually prescribed medications unless otherwise directed. 3. If you need a refill on your pain medication, please contact your pharmacy.  They will contact our office to request authorization. Prescriptions will not be filled after 5 pm or on week-ends. 4. You should follow a light diet the first 24 hours after arrival home, such as soup and crackers, etc.  Be sure to include lots of fluids daily.  Resume your normal diet the day after surgery. 5. Most patients will experience some swelling and bruising around the umbilicus or in the groin and scrotum.  Ice packs and reclining will help.  Swelling and bruising can take several days to resolve.  6. It is common to experience some constipation if taking pain medication after surgery.  Increasing fluid intake and taking a stool softener (such as Colace) will usually help or prevent this problem from occurring.  A mild laxative (Milk of Magnesia or Miralax) should be taken according to package directions if there are no bowel movements after 48 hours. 7. Unless discharge instructions indicate otherwise, you may remove your bandages 72 hours after surgery, and you may shower at that time.  You may have steri-strips (small skin tapes) in place directly over the incision.  These strips should be left on the skin.  If your surgeon used skin glue on the incision, you may shower in 24  hours.  The glue will flake off over the next 2-3 weeks.  Any sutures or staples will be removed at the office during your follow-up visit. 8. ACTIVITIES:  You may resume light daily activities beginning the next day--such as daily self-care, walking, climbing stairs.   You may have sexual intercourse when it is comfortable.  Refrain from any heavy lifting or straining-nothing over 10 pounds for 6 weeks.  a. You may drive when you are no longer taking prescription pain medication, you can comfortably wear a seatbelt, and you can safely maneuver your car and apply brakes. b. RETURN TO WORK:  __________________________________________________________ 9. You should see your doctor in the office for a follow-up appointment approximately 2-3 weeks after your surgery.  Make sure that you call for this appointment within a day or two after you arrive home to insure a convenient appointment time. 10. OTHER INSTRUCTIONS:  __________________________________________________________________________________________________________________________________________________________________________________________  WHEN TO CALL YOUR DOCTOR: 1. Fever over 101.0 2. Inability to urinate 3. Nausea and/or vomiting 4. Extreme swelling or bruising 5. Continued bleeding from incision. 6. Increased pain, redness, or drainage from the incision  The clinic staff is available to answer your questions during regular business hours.  Please dont hesitate to call and ask to speak to one of the nurses for clinical concerns.  If you have a medical emergency, go to the nearest emergency room or call 911.  A surgeon from [facility] is always on call at the hospital   [address] [place],  [place], [place]  [address] ?  [address], [place], [place]   [address] [phone] ? [phone] ? FAX [phone] Web site: [email or web address]   Post Anesthesia Home Care  Instructions  Activity: Get plenty of rest for the remainder of the day. A responsible adult should stay with you for 24 hours following the procedure.  For the next 24 hours, DO NOT: -Drive a car -Paediatric nurse -Drink alcoholic beverages -Take any medication unless instructed by your physician -Make any legal decisions or sign important papers.  Meals: Start with liquid foods such as gelatin or soup. Progress to regular foods as tolerated. Avoid greasy, spicy, heavy foods. If nausea and/or vomiting occur, drink only clear liquids until the nausea and/or vomiting subsides. Call your physician if vomiting continues.  Special Instructions/Symptoms: Your throat may feel dry or sore from the anesthesia or the breathing tube placed in your throat during surgery. If this causes discomfort, gargle with warm salt water. The discomfort should disappear within 24 hours.

## 2014-01-04 ENCOUNTER — Encounter (HOSPITAL_BASED_OUTPATIENT_CLINIC_OR_DEPARTMENT_OTHER): Payer: Self-pay | Admitting: General Surgery

## 2014-08-29 DIAGNOSIS — M545 Low back pain, unspecified: Secondary | ICD-10-CM | POA: Insufficient documentation

## 2014-08-29 DIAGNOSIS — C649 Malignant neoplasm of unspecified kidney, except renal pelvis: Secondary | ICD-10-CM | POA: Insufficient documentation

## 2014-08-29 DIAGNOSIS — C7951 Secondary malignant neoplasm of bone: Secondary | ICD-10-CM | POA: Insufficient documentation

## 2014-08-29 DIAGNOSIS — Z923 Personal history of irradiation: Secondary | ICD-10-CM | POA: Insufficient documentation

## 2014-09-05 ENCOUNTER — Other Ambulatory Visit: Payer: Self-pay | Admitting: Orthopedic Surgery

## 2014-09-05 DIAGNOSIS — D489 Neoplasm of uncertain behavior, unspecified: Secondary | ICD-10-CM

## 2014-09-05 DIAGNOSIS — M8458XA Pathological fracture in neoplastic disease, other specified site, initial encounter for fracture: Secondary | ICD-10-CM

## 2014-09-07 ENCOUNTER — Inpatient Hospital Stay: Admission: RE | Admit: 2014-09-07 | Payer: BC Managed Care – PPO | Source: Ambulatory Visit

## 2014-09-26 ENCOUNTER — Ambulatory Visit
Admission: RE | Admit: 2014-09-26 | Discharge: 2014-09-26 | Disposition: A | Discharge status: Home / Self Care | Payer: Medicare PPO | Source: Ambulatory Visit | Attending: Orthopedic Surgery | Admitting: Orthopedic Surgery

## 2014-09-26 DIAGNOSIS — M8458XA Pathological fracture in neoplastic disease, other specified site, initial encounter for fracture: Principal | ICD-10-CM

## 2014-09-26 DIAGNOSIS — D489 Neoplasm of uncertain behavior, unspecified: Secondary | ICD-10-CM

## 2014-10-13 ENCOUNTER — Telehealth: Payer: Self-pay | Admitting: *Deleted

## 2014-10-13 NOTE — Telephone Encounter (Signed)
Received a referral from [facility] and after reviewing the pt's records its determined that she should see one of the other med oncs due to her diagnosis.  Emailed Tiffany in [facility] to make her aware.

## 2014-10-18 ENCOUNTER — Telehealth: Payer: Self-pay | Admitting: Hematology & Oncology

## 2014-10-18 NOTE — Telephone Encounter (Signed)
Spoke with patient was not aware of the referral and wanted to consult referring Dr before scheduling any appt.

## 2014-11-20 ENCOUNTER — Other Ambulatory Visit (HOSPITAL_BASED_OUTPATIENT_CLINIC_OR_DEPARTMENT_OTHER): Payer: Medicare PPO

## 2014-11-20 ENCOUNTER — Other Ambulatory Visit: Payer: Medicare PPO | Admitting: Nurse Practitioner

## 2014-11-20 ENCOUNTER — Ambulatory Visit (HOSPITAL_BASED_OUTPATIENT_CLINIC_OR_DEPARTMENT_OTHER): Payer: Medicare PPO | Admitting: Family

## 2014-11-20 ENCOUNTER — Ambulatory Visit: Payer: Medicare PPO | Admitting: Hematology & Oncology

## 2014-11-20 ENCOUNTER — Encounter: Payer: Self-pay | Admitting: Hematology & Oncology

## 2014-11-20 ENCOUNTER — Ambulatory Visit: Payer: Medicare PPO

## 2014-11-20 VITALS — BP 118/61 | HR 88 | Temp 97.8°F | Resp 16 | Ht 65.0 in | Wt 139.0 lb

## 2014-11-20 DIAGNOSIS — C642 Malignant neoplasm of left kidney, except renal pelvis: Secondary | ICD-10-CM | POA: Diagnosis not present

## 2014-11-20 DIAGNOSIS — C7951 Secondary malignant neoplasm of bone: Secondary | ICD-10-CM | POA: Diagnosis not present

## 2014-11-20 DIAGNOSIS — C649 Malignant neoplasm of unspecified kidney, except renal pelvis: Secondary | ICD-10-CM

## 2014-11-20 HISTORY — DX: Malignant neoplasm of unspecified kidney, except renal pelvis: C64.9

## 2014-11-20 LAB — CBC WITH DIFFERENTIAL (CANCER CENTER ONLY)
BASO#: 0 10*3/uL (ref 0.0–0.2)
BASO%: 0.5 % (ref 0.0–2.0)
EOS ABS: 0.2 10*3/uL (ref 0.0–0.5)
EOS%: 6.2 % (ref 0.0–7.0)
HCT: 35.7 % (ref 34.8–46.6)
HGB: 11.4 g/dL — ABNORMAL LOW (ref 11.6–15.9)
LYMPH#: 0.7 10*3/uL — AB (ref 0.9–3.3)
LYMPH%: 18.8 % (ref 14.0–48.0)
MCH: 30.7 pg (ref 26.0–34.0)
MCHC: 31.9 g/dL — ABNORMAL LOW (ref 32.0–36.0)
MCV: 96 fL (ref 81–101)
MONO#: 0.3 10*3/uL (ref 0.1–0.9)
MONO%: 8 % (ref 0.0–13.0)
NEUT#: 2.5 10*3/uL (ref 1.5–6.5)
NEUT%: 66.5 % (ref 39.6–80.0)
Platelets: 155 10*3/uL (ref 145–400)
RBC: 3.71 10*6/uL (ref 3.70–5.32)
RDW: 16.5 % — AB (ref 11.1–15.7)
WBC: 3.7 10*3/uL — ABNORMAL LOW (ref 3.9–10.0)

## 2014-11-20 LAB — COMPREHENSIVE METABOLIC PANEL
ALBUMIN: 3.9 g/dL (ref 3.6–5.1)
ALK PHOS: 49 U/L (ref 33–130)
ALT: 11 U/L (ref 6–29)
AST: 17 U/L (ref 10–35)
BUN: 18 mg/dL (ref 7–25)
CHLORIDE: 103 mmol/L (ref 98–110)
CO2: 25 mmol/L (ref 20–31)
Calcium: 9 mg/dL (ref 8.6–10.4)
Creatinine, Ser: 1.21 mg/dL — ABNORMAL HIGH (ref 0.60–0.93)
Glucose, Bld: 130 mg/dL — ABNORMAL HIGH (ref 65–99)
Potassium: 3.4 mmol/L — ABNORMAL LOW (ref 3.5–5.3)
Sodium: 138 mmol/L (ref 135–146)
TOTAL PROTEIN: 6 g/dL — AB (ref 6.1–8.1)
Total Bilirubin: 0.4 mg/dL (ref 0.2–1.2)

## 2014-11-20 LAB — PREALBUMIN: Prealbumin: 28 mg/dL (ref 17–34)

## 2014-11-20 LAB — CEA: CEA: 1.1 ng/mL (ref 0.0–5.0)

## 2014-11-20 LAB — LACTATE DEHYDROGENASE: LDH: 144 U/L (ref 94–250)

## 2014-11-20 NOTE — Progress Notes (Signed)
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 She was diagnosed initially in 2013 with stage IV clear cell renal carcinoma. She had palliative radiation to the lower thoracic and lumbar spine in May 2013 and initiated Pazopanib 800 mg daily that same month. She then received palliative radiation to the left scapula in June 2013. This was followed by a left nephrectomy and adrenalectomy in January 2014.  She still has some back discomfort and is wearing a back brace today. She finished a second round of palliative radiation to the left scapula last Tuesday.  She has been on and off of Pazopanib and completed her most recent cycle in August 2016.  She had discussed starting Opdivo with her oncologist. Unfortunately, a recent MRI showed progression of metastatic lesions in the lumbar vertebral body and left sacrum.  She is here today for a second opinion. She was unsure about how to proceed. Despite her back pain she is ambulating nicely and able to stand erect. The brace helps her.  Family cancer history includes: mother who passed away from a "female cancer" when she was 11 yo and brother passed away from metastatic lung cancer.  She has had no issues with infection. No fever, chills, n/v, cough, rash, SOB, chest pain, abdominal pain, constipation or diarrhea. She has not noticed any blood in her stool.  No lymphadenopathy found on exam.  She does have vertigo and takes Antivert as needed.  No  swelling, numbness or tingling in her extremities. No new aches or pains.  She is eating well and staying hydrated. She denies any significant weight loss or gain.  She is due for her mammogram in October and has scheduled her appointment. She states that her mammogram last year was negative.  She had a total hysterectomy in 2004. She has 3 children all of whom are healthy and had no miscarriages.  She smoked 1 ppw but quit years ago. She does not drink ETOH.  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 She was off work for back pain when they stumbled upon her renal cell carcinoma with scans and she was unable to return.   ROS: All other 10 point review of systems is negative.   PAST MEDICAL HISTORY:   Past Medical History  Diagnosis Date  . Atypical chest pain     NORMAL CARDIOLITE STUDY   . Dyslipidemia   . Hypertension   . Syncope     HISTORY OF  . Asthma   . Seasonal allergies   . GERD (gastroesophageal reflux disease)   . Arthritis     arthritis in knees and back   . Complication of anesthesia     hard to wake up from umb hernia-was given alot of meds  . Metastatic renal cell carcinoma to bone 11/20/2014    ALLERGIES: Allergies  Allergen Reactions  . Gnp [Acetaminophen]     "GNR" (cough syrup)   .  Tramadol Nausea And Vomiting and Other (See Comments)    dizziness   . Avelox [Moxifloxacin Hcl In Nacl] Palpitations and Other (See Comments)    Body ache   . Etodolac Palpitations and Other (See Comments)    Joints hurt      MEDICATIONS:  Current Outpatient Prescriptions on File Prior to Visit  Medication Sig Dispense Refill  . acetaminophen (TYLENOL) 500 MG tablet Take 1,000 mg by mouth every 6 (six) hours as needed. Pain     . albuterol (PROVENTIL,VENTOLIN) 90 MCG/ACT inhaler Inhale 2 puffs into the lungs every 4 (four) hours as needed. Wheezing     . amLODipine (NORVASC) 5 MG tablet Take 5 mg by mouth daily.    .  Ascorbic Acid (VITAMIN C) 1000 MG tablet Take 1,000 mg by mouth daily.     . Calcium Carbonate-Vitamin D (CALCIUM + D PO) Take 1 tablet by mouth 2 (two) times daily.     . fish oil-omega-3 fatty acids 1000 MG capsule Take 1 g by mouth 2 (two) times daily.     . fluticasone (FLONASE) 50 MCG/ACT nasal spray Place 1 spray into the nose 2 (two) times daily as needed. Allergies     . Fluticasone-Salmeterol (ADVAIR DISKUS) 100-50 MCG/DOSE AEPB Inhale 1 puff into the lungs 2 (two) times daily.     . hydrochlorothiazide (HYDRODIURIL) 25 MG tablet Take 25 mg by mouth daily.    . magnesium gluconate (MAGONATE) 500 MG tablet Take 500 mg by mouth daily.    . meclizine (ANTIVERT) 25 MG tablet Take 12.5 mg by mouth 3 (three) times daily as needed. For dizziness.    . methocarbamol (ROBAXIN) 500 MG tablet Take 500 mg by mouth every 6 (six) hours as needed. For pain.    . montelukast (SINGULAIR) 10 MG tablet Take 10 mg by mouth at bedtime.     . ondansetron (ZOFRAN) 4 MG tablet Take 1 tablet (4 mg total) by mouth every 6 (six) hours as needed for nausea or vomiting. 20 tablet 0  . oxyCODONE (OXY IR/ROXICODONE) 5 MG immediate release tablet Take 1 tablet (5 mg total) by mouth every 4 (four) hours as needed for moderate pain, severe pain or breakthrough pain. 30 tablet 0  . rosuvastatin (CRESTOR) 10 MG tablet Take 10 mg by mouth at bedtime.     . vitamin B-12 (CYANOCOBALAMIN) 1000 MCG tablet Take 1,000 mcg by mouth daily.     . [DISCONTINUED] enoxaparin (LOVENOX) 30 MG/0.3ML SOLN Inject 0.3 mLs (30 mg total) into the skin every 12 (twelve) hours. 28 Syringe 0  . [DISCONTINUED] famotidine (PEPCID) 20 MG tablet Take 20 mg by mouth 2 (two) times daily as needed. Heart burn     . [DISCONTINUED] ferrous sulfate 325 (65 FE) MG tablet Take 1 tablet (325 mg total) by mouth 3 (three) times daily after meals. 90 tablet 0   No current facility-administered medications on file prior to visit.     PAST SURGICAL  HISTORY Past Surgical History  Procedure Laterality Date  . Appendectomy    . Carpal tunnel release      R  . Other surgical history      bladder vault prolapse  . Other surgical history      right foot bunion and hammer toe surgery   . Other surgical history      right knee arthroscopic   . Other surgical history      right arm ( underarm ) surgery removed ? cyst   .  Dilation and curettage of uterus    . Tubal ligation      1977  . Total knee arthroplasty  03/11/2011    Procedure: TOTAL KNEE ARTHROPLASTY;  Surgeon: Cynda Familia, MD;  Location: [facility];  Service: Orthopedics;  Laterality: Right;  . Abdominal hysterectomy  2004    rectocele  . Cystocele repair    . Colonoscopy    . Hernia repair  2014    umb  . 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 Inguinal hernia repair Left 01/03/2014    Procedure: LEFT INGUINAL HERNIA REPAIR ;  Surgeon: [name], MD;  Location: [place];  Service: General;  Laterality: Left;  . Insertion of mesh Left 01/03/2014    Procedure: INSERTION OF MESH;  Surgeon: [name], MD;  Location: [place];  Service: General;  Laterality: Left;    FAMILY HISTORY: Family History  Problem Relation Age of Onset  . Heart attack Father     DECEASED  . Cancer Mother     DECEASED  . Cancer Brother     LUNG CA  . Aneurysm Brother     AAA    SOCIAL HISTORY:  reports that she quit smoking about 29 years ago. She does not have any smokeless tobacco history on file. She reports that she does not drink alcohol or use illicit drugs.  PERFORMANCE STATUS: The patient's performance status is 1 - Symptomatic but completely ambulatory  PHYSICAL EXAM: Most Recent Vital Signs: There were no vitals taken for this visit. BP 118/61 mmHg  Pulse 88  Temp(Src) 97.8 F (36.6 C) (Oral)  Resp 16  Ht 5\' 5"  (1.651 m)  Wt 139 lb (63.05 kg)  BMI 23.13 kg/m2  General Appearance:    Alert, cooperative, no  distress, appears stated age  Head:    Normocephalic, without obvious abnormality, atraumatic  Eyes:    PERRL, conjunctiva/corneas clear, EOM's intact, fundi    benign, both eyes        Throat:   Lips, mucosa, and tongue normal; teeth and gums normal  Neck:   Supple, symmetrical, trachea midline, no adenopathy;    thyroid:  no enlargement/tenderness/nodules; no carotid   bruit or JVD  Back:     Symmetric, no curvature, ROM normal, no CVA tenderness  Lungs:     Clear to auscultation bilaterally, respirations unlabored  Chest Wall:    No tenderness or deformity   Heart:    Regular rate and rhythm, S1 and S2 normal, no murmur, rub   or gallop     Abdomen:     Soft, non-tender, bowel sounds active all four quadrants,    no masses, no organomegaly        Extremities:   Extremities normal, atraumatic, no cyanosis or edema  Pulses:   2+ and symmetric all extremities  Skin:   Skin color, texture, turgor normal, no rashes or lesions  Lymph nodes:   Cervical, supraclavicular, and axillary nodes normal  Neurologic:   CNII-XII intact, normal strength, sensation and reflexes    throughout    LABORATORY DATA:  No results found for this or any previous visit (from the past 48 hour(s)).    RADIOGRAPHY: No results found.     PATHOLOGY: None  ASSESSMENT/PLAN: Ms. Orchard is a very pleasant 69 yo white female with history of metastatic clear cell renal carcinomal with metastatic lesions to T12, L2, L3, L5 and within the left sacrum (diagnosed May, 2013).  She has had palliative radiation to the spine and left scapula. This does help some with her discomfort. She also had a left nephrectomy and adrenalectomy in January 2014.   She completed her last palliative radiation last Tuesday. She may be a candidate for kyphoplasty.  She has been on and off of Pazopanib her most recent cycle finished up in August 2016.  Her most recent MRI showed progression of spinal and left scapula lesions.  Her  oncologist suggested she start treatment with Opdivo. This would certainly be appropriate for her situation.  We were consulted by the patient for a second opinion. She does not need to follow-up with [place] at this time. She has any other questions or concerns she is welcome to contact us. All questions were answered.   The patient was discussed with and also seen by [name] and he is in agreement with the aforementioned.   [facility] M     Addendum:  I saw and examined the patient with Sarah. I looked at her scans. I can understand why there is a change in protocol.  I totally agree with her doctor's recommendation of Opdivo. Recent studies have shown that Opdivo definitely has a good effect with second line therapy for metastatic renal cell carcinoma.  There are surly other options to consider.  I also agree with her needing bone therapy.  I think that a kyphoplasty might be indicated for the pain in her back. It sounds like she has a compression fracture back there that might be causing her some discomfort.  I told her that we can certainly help her out in any way. However, she is getting very good care from her oncologist in [facility]. I certainly think that he is doing a great job with her.  We spent about 45 minutes with her. We answered all of her questions. We reviewed her labs.   Laurey Arrow

## 2014-11-22 ENCOUNTER — Telehealth: Payer: Self-pay | Admitting: Hematology & Oncology

## 2014-11-22 ENCOUNTER — Telehealth: Payer: Self-pay | Admitting: *Deleted

## 2014-11-22 NOTE — Telephone Encounter (Signed)
Patient was seen in our office on Monday for a second opinion. She calls today because she would like her care transferred to Dr Marin Olp and receive treatment here. She is due to start [place] on Monday. Spoke to Dr Marin Olp who is happy to take over care. She can receive treatment here on Monday if we can get her insurance to provide this office with prior authorization. Baxter Flattery is working on this. Patient is aware that we are working to have her moved here based on insurance response.   Patient requests that we call her cell phone when trying to reach her.

## 2014-11-22 NOTE — Telephone Encounter (Signed)
Pt recently had chemo services w [facility], a service of [facility], located on the campus of [facility]. I had to call Amy @ [phone], to have her withdraw their prior auth for OPDIVO, in order for [name] to get authorized services to treat the patient w [facility]. P: [phone] [facility]  [place] Prior Auth  [id number] - Withdrawn [phone]   [facility] APPROVED w Dr. Johnette Abraham [phone] Opdivo 240mg  every two weeks Auth: [id number] Valid: 11/23/2014 - 02/19/2015   [employer] [place]  Pending Auth: [id number] Valid: 11/23/2014 - 02/24/2015 [id number] - Opdivo   Pending Auth: [id number] Valid: 02/25/2015 - 11/22/2015 [id number] Armida Sans

## 2014-11-27 ENCOUNTER — Other Ambulatory Visit: Payer: Self-pay | Admitting: Hematology & Oncology

## 2014-11-27 ENCOUNTER — Encounter: Payer: Self-pay | Admitting: *Deleted

## 2014-11-27 ENCOUNTER — Ambulatory Visit (HOSPITAL_BASED_OUTPATIENT_CLINIC_OR_DEPARTMENT_OTHER): Payer: Medicare PPO

## 2014-11-27 VITALS — BP 115/50 | HR 80 | Temp 98.2°F | Resp 18

## 2014-11-27 DIAGNOSIS — Z5112 Encounter for antineoplastic immunotherapy: Secondary | ICD-10-CM | POA: Diagnosis not present

## 2014-11-27 DIAGNOSIS — C649 Malignant neoplasm of unspecified kidney, except renal pelvis: Secondary | ICD-10-CM

## 2014-11-27 DIAGNOSIS — C7951 Secondary malignant neoplasm of bone: Principal | ICD-10-CM

## 2014-11-27 DIAGNOSIS — C642 Malignant neoplasm of left kidney, except renal pelvis: Secondary | ICD-10-CM

## 2014-11-27 MED ORDER — SODIUM CHLORIDE 0.9 % IV SOLN
240.0000 mg | Freq: Once | INTRAVENOUS | Status: AC
  Administered 2014-11-27: 240 mg via INTRAVENOUS
  Filled 2014-11-27: qty 24

## 2014-11-27 MED ORDER — SODIUM CHLORIDE 0.9 % IV SOLN
Freq: Once | INTRAVENOUS | Status: AC
  Administered 2014-11-27: 11:00:00 via INTRAVENOUS

## 2014-11-27 NOTE — Patient Instructions (Signed)
[place] Discharge Instructions for Patients Receiving Chemotherapy  Today you received the following chemotherapy agents Opdivo    If you develop nausea and vomiting that is not controlled by your nausea medication, call the clinic. If it is after clinic hours your family physician or the after hours number for the clinic or go to the Emergency Department.   BELOW ARE SYMPTOMS THAT SHOULD BE REPORTED IMMEDIATELY:  *FEVER GREATER THAN 100.5 F  *CHILLS WITH OR WITHOUT FEVER  NAUSEA AND VOMITING THAT IS NOT CONTROLLED WITH YOUR NAUSEA MEDICATION  *UNUSUAL SHORTNESS OF BREATH  *UNUSUAL BRUISING OR BLEEDING  TENDERNESS IN MOUTH AND THROAT WITH OR WITHOUT PRESENCE OF ULCERS  *URINARY PROBLEMS  *BOWEL PROBLEMS  UNUSUAL RASH Items with * indicate a potential emergency and should be followed up as soon as possible.  One of the nurses will contact you 24 hours after your treatment. Please let the nurse know about any problems that you may have experienced. Feel free to call the clinic you have any questions or concerns. The clinic phone number is [phone].   I have been informed and understand all the instructions given to me. I know to contact the clinic, my physician, or go to the Emergency Department if any problems should occur. I do not have any questions at this time, but understand that I may call the clinic during office hours   should I have any questions or need assistance in obtaining follow up care.    __________________________________________  _____________  __________ Signature of Patient or Authorized Representative            Date                   Time    __________________________________________ Nurse's Signature   Nivolumab injection What is this medicine? NIVOLUMAB (nye VOL ue mab) is used to treat certain types of melanoma and lung cancer. This medicine may be used for other purposes; ask your health care provider or pharmacist if  you have questions. COMMON BRAND NAME(S): Opdivo What should I tell my health care provider before I take this medicine? They need to know if you have any of these conditions: -eye disease, vision problems -history of pancreatitis -immune system problems -inflammatory bowel disease -kidney disease -liver disease -lung disease -lupus -myasthenia gravis -multiple sclerosis -organ transplant -stomach or intestine problems -thyroid disease -tingling of the fingers or toes, or other nerve disorder -an unusual or allergic reaction to nivolumab, other medicines, foods, dyes, or preservatives -pregnant or trying to get pregnant -breast-feeding How should I use this medicine? This medicine is for infusion into a vein. It is given by a health care professional in a hospital or clinic setting. A special MedGuide will be given to you before each treatment. Be sure to read this information carefully each time. Talk to your pediatrician regarding the use of this medicine in children. Special care may be needed. Overdosage: If you think you've taken too much of this medicine contact a poison control center or emergency room at once. Overdosage: If you think you have taken too much of this medicine contact a poison control center or emergency room at once. NOTE: This medicine is only for you. Do not share this medicine with others. What if I miss a dose? It is important not to miss your dose. Call your doctor or health care professional if you are unable to keep an appointment. What may interact with this medicine? Interactions have  not been studied. This list may not describe all possible interactions. Give your health care provider a list of all the medicines, herbs, non-prescription drugs, or dietary supplements you use. Also tell them if you smoke, drink alcohol, or use illegal drugs. Some items may interact with your medicine. What should I watch for while using this medicine? Tell your doctor  or healthcare professional if your symptoms do not start to get better or if they get worse. Your condition will be monitored carefully while you are receiving this medicine. You may need blood work done while you are taking this medicine. What side effects may I notice from receiving this medicine? Side effects that you should report to your doctor or health care professional as soon as possible: -allergic reactions like skin rash, itching or hives, swelling of the face, lips, or tongue -black, tarry stools -bloody or watery diarrhea -changes in vision -chills -cough -depressed mood -eye pain -feeling anxious -fever -general ill feeling or flu-like symptoms -hair loss -loss of appetite -low blood counts - this medicine may decrease the number of white blood cells, red blood cells and platelets. You may be at increased risk for infections and bleeding -pain, tingling, numbness in the hands or feet -redness, blistering, peeling or loosening of the skin, including inside the mouth -red pinpoint spots on skin -signs of decreased platelets or bleeding - bruising, pinpoint red spots on the skin, black, tarry stools, blood in the urine -signs of decreased red blood cells - unusually weak or tired, feeling faint or lightheaded, falls -signs of infection - fever or chills, cough, sore throat, pain or trouble passing urine -signs and symptoms of a dangerous change in heartbeat or heart rhythm like chest pain; dizziness; fast or irregular heartbeat; palpitations; feeling faint or lightheaded, falls; breathing problems -signs and symptoms of high blood sugar such as dizziness; dry mouth; dry skin; fruity breath; nausea; stomach pain; increased hunger or thirst; increased urination -signs and symptoms of kidney injury like trouble passing urine or change in the amount of urine -signs and symptoms of liver injury like dark yellow or brown urine; general ill feeling or flu-like symptoms; light-colored  stools; loss of appetite; nausea; right upper belly pain; unusually weak or tired; yellowing of the eyes or skin -signs and symptoms of increased potassium like muscle weakness; chest pain; or fast, irregular heartbeat -signs and symptoms of low potassium like muscle cramps or muscle pain; chest pain; dizziness; feeling faint or lightheaded, falls; palpitations; breathing problems; or fast, irregular heartbeat -swelling of the ankles, feet, hands -weight gainSide effects that usually do not require medical attention (report to your doctor or health care professional if they continue or are bothersome): -constipation -general ill feeling or flu-like symptoms -hair loss -loss of appetite -nausea, vomiting This list may not describe all possible side effects. Call your doctor for medical advice about side effects. You may report side effects to FDA at 1-800-FDA-1088. Where should I keep my medicine? This drug is given in a hospital or clinic and will not be stored at home. NOTE: This sheet is a summary. It may not cover all possible information. If you have questions about this medicine, talk to your doctor, pharmacist, or health care provider.  2015, Elsevier/Gold Standard. (2013-05-02 13:18:19)

## 2014-11-30 ENCOUNTER — Telehealth: Payer: Self-pay | Admitting: *Deleted

## 2014-11-30 NOTE — Telephone Encounter (Signed)
[name] sinus drainage without other symptoms. Dr Marin Olp would like patient to take Claritin or Zyrtec OTC. Patient denies temperature at this time. Educated patient to call the office back if she develops a fever. She understood and will contact the office as needed.

## 2014-12-01 ENCOUNTER — Encounter (HOSPITAL_COMMUNITY): Payer: Self-pay | Admitting: Emergency Medicine

## 2014-12-01 ENCOUNTER — Emergency Department (HOSPITAL_COMMUNITY)
Admission: EM | Admit: 2014-12-01 | Discharge: 2014-12-01 | Disposition: A | Discharge status: Home / Self Care | Payer: Medicare PPO | Attending: Emergency Medicine | Admitting: Emergency Medicine

## 2014-12-01 ENCOUNTER — Emergency Department (HOSPITAL_COMMUNITY): Payer: Medicare PPO

## 2014-12-01 DIAGNOSIS — Z85830 Personal history of malignant neoplasm of bone: Secondary | ICD-10-CM | POA: Diagnosis not present

## 2014-12-01 DIAGNOSIS — Z85528 Personal history of other malignant neoplasm of kidney: Secondary | ICD-10-CM | POA: Insufficient documentation

## 2014-12-01 DIAGNOSIS — R0981 Nasal congestion: Secondary | ICD-10-CM

## 2014-12-01 DIAGNOSIS — J069 Acute upper respiratory infection, unspecified: Secondary | ICD-10-CM | POA: Diagnosis not present

## 2014-12-01 DIAGNOSIS — J45909 Unspecified asthma, uncomplicated: Secondary | ICD-10-CM | POA: Diagnosis not present

## 2014-12-01 DIAGNOSIS — E785 Hyperlipidemia, unspecified: Secondary | ICD-10-CM | POA: Insufficient documentation

## 2014-12-01 DIAGNOSIS — I10 Essential (primary) hypertension: Secondary | ICD-10-CM | POA: Insufficient documentation

## 2014-12-01 DIAGNOSIS — Z87891 Personal history of nicotine dependence: Secondary | ICD-10-CM | POA: Diagnosis not present

## 2014-12-01 DIAGNOSIS — Z7951 Long term (current) use of inhaled steroids: Secondary | ICD-10-CM | POA: Insufficient documentation

## 2014-12-01 DIAGNOSIS — M158 Other polyosteoarthritis: Secondary | ICD-10-CM | POA: Insufficient documentation

## 2014-12-01 DIAGNOSIS — R509 Fever, unspecified: Secondary | ICD-10-CM | POA: Diagnosis present

## 2014-12-01 DIAGNOSIS — R059 Cough, unspecified: Secondary | ICD-10-CM

## 2014-12-01 DIAGNOSIS — R05 Cough: Secondary | ICD-10-CM

## 2014-12-01 DIAGNOSIS — Z79899 Other long term (current) drug therapy: Secondary | ICD-10-CM | POA: Insufficient documentation

## 2014-12-01 DIAGNOSIS — K219 Gastro-esophageal reflux disease without esophagitis: Secondary | ICD-10-CM | POA: Insufficient documentation

## 2014-12-01 LAB — URINALYSIS, ROUTINE W REFLEX MICROSCOPIC
Bilirubin Urine: NEGATIVE
Glucose, UA: NEGATIVE mg/dL
Hgb urine dipstick: NEGATIVE
Ketones, ur: NEGATIVE mg/dL
NITRITE: NEGATIVE
Protein, ur: NEGATIVE mg/dL
SPECIFIC GRAVITY, URINE: 1.009 (ref 1.005–1.030)
UROBILINOGEN UA: 0.2 mg/dL (ref 0.0–1.0)
pH: 6 (ref 5.0–8.0)

## 2014-12-01 LAB — CBC WITH DIFFERENTIAL/PLATELET
BASOS ABS: 0 10*3/uL (ref 0.0–0.1)
Basophils Relative: 0 %
Eosinophils Absolute: 0.6 10*3/uL (ref 0.0–0.7)
Eosinophils Relative: 9 %
HEMATOCRIT: 34.7 % — AB (ref 36.0–46.0)
Hemoglobin: 11.1 g/dL — ABNORMAL LOW (ref 12.0–15.0)
Lymphocytes Relative: 10 %
Lymphs Abs: 0.7 10*3/uL (ref 0.7–4.0)
MCH: 30.1 pg (ref 26.0–34.0)
MCHC: 32 g/dL (ref 30.0–36.0)
MCV: 94 fL (ref 78.0–100.0)
Monocytes Absolute: 0.6 10*3/uL (ref 0.1–1.0)
Monocytes Relative: 9 %
NEUTROS ABS: 4.6 10*3/uL (ref 1.7–7.7)
Neutrophils Relative %: 72 %
Platelets: 216 10*3/uL (ref 150–400)
RBC: 3.69 MIL/uL — ABNORMAL LOW (ref 3.87–5.11)
RDW: 17.2 % — ABNORMAL HIGH (ref 11.5–15.5)
WBC: 6.5 10*3/uL (ref 4.0–10.5)

## 2014-12-01 LAB — COMPREHENSIVE METABOLIC PANEL
ALT: 13 U/L — AB (ref 14–54)
ANION GAP: 6 (ref 5–15)
AST: 20 U/L (ref 15–41)
Albumin: 3.9 g/dL (ref 3.5–5.0)
Alkaline Phosphatase: 61 U/L (ref 38–126)
BUN: 21 mg/dL — ABNORMAL HIGH (ref 6–20)
CHLORIDE: 101 mmol/L (ref 101–111)
CO2: 26 mmol/L (ref 22–32)
Calcium: 9.1 mg/dL (ref 8.9–10.3)
Creatinine, Ser: 1.44 mg/dL — ABNORMAL HIGH (ref 0.44–1.00)
GFR calc non Af Amer: 36 mL/min — ABNORMAL LOW (ref >60)
GFR, EST AFRICAN AMERICAN: 41 mL/min — AB (ref >60)
Glucose, Bld: 108 mg/dL — ABNORMAL HIGH (ref 65–99)
Potassium: 3.7 mmol/L (ref 3.5–5.1)
SODIUM: 133 mmol/L — AB (ref 135–145)
Total Bilirubin: 0.7 mg/dL (ref 0.3–1.2)
Total Protein: 7 g/dL (ref 6.5–8.1)

## 2014-12-01 LAB — I-STAT CG4 LACTIC ACID, ED: LACTIC ACID, VENOUS: 0.89 mmol/L (ref 0.5–2.0)

## 2014-12-01 LAB — URINE MICROSCOPIC-ADD ON

## 2014-12-01 NOTE — ED Notes (Signed)
Bed: XA15 Expected date:  Expected time:  Means of arrival:  Comments: t1

## 2014-12-01 NOTE — ED Provider Notes (Signed)
CSN: [id number]     Arrival date & time 12/01/14  1910 History   First MD Initiated Contact with Patient 12/01/14 2109     Chief Complaint  Patient presents with  . Fever     (Consider location/radiation/quality/duration/timing/severity/associated sxs/prior Treatment) Patient is a 69 y.o. female presenting with fever.  Fever Max temp prior to arrival:  101.4 Severity:  Severe Onset quality:  Gradual Duration: yesterday was 99.4, 101.4 fever started today. Timing:  Constant Chronicity:  New Worsened by:  Nothing tried Ineffective treatments:  Acetaminophen Associated symptoms: chills, congestion (Wednesday), cough (2 days, productive sometimes, yellow), rhinorrhea and sore throat   Associated symptoms: no chest pain, no diarrhea, no dysuria, no headaches (every now and then, no current HA), no nausea, no rash and no vomiting   Risk factors: hx of cancer and sick contacts (husband had cough for about 5 weeks, over it for last week)     Past Medical History  Diagnosis Date  . Atypical chest pain     NORMAL CARDIOLITE STUDY   . Dyslipidemia   . Hypertension   . Syncope     HISTORY OF  . Asthma   . Seasonal allergies   . GERD (gastroesophageal reflux disease)   . Arthritis     arthritis in knees and back   . Complication of anesthesia     hard to wake up from umb hernia-was given alot of meds  . Metastatic renal cell carcinoma to bone ([place]) 11/20/2014   Past Surgical History  Procedure Laterality Date  . Appendectomy    . Carpal tunnel release      R  . Other surgical history      bladder vault prolapse  . Other surgical history      right foot bunion and hammer toe surgery   . Other surgical history      right knee arthroscopic   . Other surgical history      right arm ( underarm ) surgery removed ? cyst   . Dilation and curettage of uterus    . Tubal ligation      1977  . Total knee arthroplasty  03/11/2011    Procedure: TOTAL KNEE ARTHROPLASTY;  Surgeon: Cynda Familia, MD;  Location: [facility];  Service: Orthopedics;  Laterality: Right;  . Abdominal hysterectomy  2004    rectocele  . Cystocele repair    . Colonoscopy    . Hernia repair  2014    umb  . 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 Inguinal hernia repair Left 01/03/2014    Procedure: LEFT INGUINAL HERNIA REPAIR ;  Surgeon: [name], MD;  Location: [facility];  Service: General;  Laterality: Left;  . Insertion of mesh Left 01/03/2014    Procedure: INSERTION OF MESH;  Surgeon: [name], MD;  Location: [facility];  Service: General;  Laterality: Left;   Family History  Problem Relation Age of Onset  . Heart attack Father     DECEASED  . Cancer Mother     DECEASED  . Cancer Brother     LUNG CA  . Aneurysm Brother     AAA   Social History  Substance Use Topics  . Smoking status: Former Smoker -- 0.25 packs/day    Quit date: 02/24/1985  . Smokeless tobacco: None  . Alcohol Use: No   OB History    No data available     Review of Systems  Constitutional: Positive for fever and chills.  HENT: Positive for congestion (Wednesday), rhinorrhea and sore throat.   Eyes: Negative for visual disturbance.  Respiratory: Positive for cough (2 days, productive sometimes, yellow). Negative for shortness of breath.   Cardiovascular: Negative for chest pain.  Gastrointestinal: Negative for nausea, vomiting, abdominal pain and diarrhea.  Genitourinary: Negative for dysuria and difficulty urinating.  Musculoskeletal: Negative for back pain and neck pain.  Skin: Negative for rash.  Neurological: Negative for syncope and headaches (every now and then, no current HA).      Allergies  Azithromycin; Gnp; Tramadol; Avelox; and Etodolac  Home Medications   Prior to Admission medications   Medication Sig Start Date End Date Taking? Authorizing Provider  acetaminophen (TYLENOL) 500 MG tablet Take 1,000 mg by mouth every 6 (six) hours as  needed. Pain    Yes Historical Provider, MD  albuterol (PROVENTIL,VENTOLIN) 90 MCG/ACT inhaler Inhale 2 puffs into the lungs every 4 (four) hours as needed. Wheezing    Yes Historical Provider, MD  amLODipine (NORVASC) 5 MG tablet Take 5 mg by mouth daily.   Yes Historical Provider, MD  Ascorbic Acid (VITAMIN C) 1000 MG tablet Take 1,000 mg by mouth daily.    Yes Historical Provider, MD  Calcium Carbonate-Vitamin D (CALCIUM + D PO) Take 1 tablet by mouth 2 (two) times daily.    Yes Historical Provider, MD  fish oil-omega-3 fatty acids 1000 MG capsule Take 1 g by mouth 2 (two) times daily.    Yes Historical Provider, MD  Fluticasone-Salmeterol (ADVAIR DISKUS) 100-50 MCG/DOSE AEPB Inhale 1 puff into the lungs 2 (two) times daily.    Yes Historical Provider, MD  hydrochlorothiazide (HYDRODIURIL) 25 MG tablet Take 25 mg by mouth daily.   Yes Historical Provider, MD  HYDROcodone-acetaminophen (NORCO/VICODIN) 5-325 MG per tablet Take 1 tablet by mouth 3 (three) times daily as needed for moderate pain.  08/25/14  Yes Historical Provider, MD  magnesium gluconate (MAGONATE) 500 MG tablet Take 500 mg by mouth daily.   Yes Historical Provider, MD  methocarbamol (ROBAXIN) 500 MG tablet Take 500 mg by mouth every 6 (six) hours as needed. For pain.   Yes Historical Provider, MD  montelukast (SINGULAIR) 10 MG tablet Take 10 mg by mouth at bedtime.    Yes Historical Provider, MD  rosuvastatin (CRESTOR) 10 MG tablet Take 10 mg by mouth at bedtime.    Yes Historical Provider, MD  vitamin B-12 (CYANOCOBALAMIN) 1000 MCG tablet Take 1,000 mcg by mouth daily.    Yes Historical Provider, MD  fluticasone (FLONASE) 50 MCG/ACT nasal spray Place 1 spray into the nose 2 (two) times daily as needed. Allergies     Historical Provider, MD  meclizine (ANTIVERT) 25 MG tablet Take 12.5 mg by mouth 3 (three) times daily as needed. For dizziness.    Historical Provider, MD  ondansetron (ZOFRAN) 4 MG tablet Take 1 tablet (4 mg total)  by mouth every 6 (six) hours as needed for nausea or vomiting. Patient not taking: Reported on 12/01/2014 01/03/14   [name], MD  oxyCODONE (OXY IR/ROXICODONE) 5 MG immediate release tablet Take 1 tablet (5 mg total) by mouth every 4 (four) hours as needed for moderate pain, severe pain or breakthrough pain. Patient not taking: Reported on 12/01/2014 01/03/14   [name], MD   BP 111/55 mmHg  Pulse 87  Temp(Src) 98.2 F (36.8 C) (Oral)  Resp 17  SpO2 98% Physical Exam  Constitutional: She is oriented to person, place,  and time. She appears well-developed and well-nourished. No distress.  HENT:  Head: Normocephalic and atraumatic.  Right Ear: Tympanic membrane is not injected.  Left Ear: Tympanic membrane is not injected.  Mouth/Throat: No oropharyngeal exudate.  No sinus tenderness  Eyes: Conjunctivae and EOM are normal.  Neck: Normal range of motion.  Cardiovascular: Normal rate, regular rhythm, normal heart sounds and intact distal pulses.  Exam reveals no gallop and no friction rub.   No murmur heard. Pulmonary/Chest: Effort normal and breath sounds normal. No respiratory distress. She has no wheezes. She has no rales.  Abdominal: Soft. She exhibits no distension. There is no tenderness. There is no guarding.  Musculoskeletal: She exhibits no edema or tenderness.  Lymphadenopathy:    She has no cervical adenopathy.  Neurological: She is alert and oriented to person, place, and time.  Skin: Skin is warm and dry. No rash noted. She is not diaphoretic. No erythema.  Nursing note and vitals reviewed.   ED Course  Procedures (including critical care time) Labs Review Labs Reviewed  COMPREHENSIVE METABOLIC PANEL - Abnormal; Notable for the following:    Sodium 133 (*)    Glucose, Bld 108 (*)    BUN 21 (*)    Creatinine, Ser 1.44 (*)    ALT 13 (*)    GFR calc non Af Amer 36 (*)    GFR calc Af Amer 41 (*)    All other components within normal limits  URINALYSIS,  ROUTINE W REFLEX MICROSCOPIC (NOT AT [facility]) - Abnormal; Notable for the following:    Leukocytes, UA TRACE (*)    All other components within normal limits  CBC WITH DIFFERENTIAL/PLATELET - Abnormal; Notable for the following:    RBC 3.69 (*)    Hemoglobin 11.1 (*)    HCT 34.7 (*)    RDW 17.2 (*)    All other components within normal limits  URINE CULTURE  URINE MICROSCOPIC-ADD ON  I-STAT CG4 LACTIC ACID, ED  I-STAT CG4 LACTIC ACID, ED    Imaging Review Dg Chest 2 View  12/01/2014   CLINICAL DATA:  Patient with congestion and cough. History of renal cell carcinoma.  EXAM: CHEST  2 VIEW  COMPARISON:  CT 04/26/2014  FINDINGS: Normal cardiac and mediastinal contours. No consolidative pulmonary opacities. No pleural effusion or pneumothorax. Metastatic lesion within the left scapula. Additionally known metastatic lesion involving the T12 vertebral level is not as well demonstrated on current exam.  IMPRESSION: No acute cardiopulmonary process.  Osseous metastasis.   Electronically Signed   By: Lovey Newcomer M.D.   On: 12/01/2014 20:47   I have personally reviewed and evaluated these images and lab results as part of my medical decision-making.   EKG Interpretation None      MDM   Final diagnoses:  Cough  Nasal congestion  Viral URI   69yo female with history of metastatic renal cell carcinoma presents with concern of fever, cough, nasal congestion and sore throat for 2 days.  Pt had blood cx ordered. Lactate WNL. Urine without infection.  CBC shows no leukocytosis and no neutropenia.  CXR without pneumonia.  Pt hemodynamically stable and afebrile i ED.  Doubt bacterial infection by history, exam, imaging and laboratory work.  Pt with sick contact and mostl ikely with viral URI as cause of symptoms and fever at home.  Discussed reasons to return to ED in detail.  Patient discharged in stable condition with understanding of reasons to return.     Gareth Morgan,  MD 12/02/14 1246

## 2014-12-01 NOTE — ED Notes (Signed)
Blood culture (2nd set) sent to lab.

## 2014-12-01 NOTE — ED Notes (Signed)
Pt from home c/o fever, sinus congestion, and cough. Pt being treated for Renal Cell Carcinoma. Last Chemo was 10/3 and last radiation was  Last month. Pt alert and oriented.

## 2014-12-01 NOTE — ED Notes (Addendum)
Blood cultures set #1 sent to main lab

## 2014-12-03 LAB — URINE CULTURE: Culture: 2000

## 2014-12-06 ENCOUNTER — Other Ambulatory Visit: Payer: Self-pay | Admitting: Hematology & Oncology

## 2014-12-06 DIAGNOSIS — J018 Other acute sinusitis: Secondary | ICD-10-CM

## 2014-12-06 MED ORDER — SULFAMETHOXAZOLE-TRIMETHOPRIM 800-160 MG PO TABS: 1.0000 | ORAL_TABLET | Freq: Two times a day (BID) | ORAL | Status: DC

## 2014-12-12 ENCOUNTER — Ambulatory Visit (HOSPITAL_BASED_OUTPATIENT_CLINIC_OR_DEPARTMENT_OTHER): Payer: Medicare PPO | Admitting: Family

## 2014-12-12 ENCOUNTER — Telehealth: Payer: Self-pay | Admitting: Hematology & Oncology

## 2014-12-12 ENCOUNTER — Encounter: Payer: Self-pay | Admitting: Family

## 2014-12-12 ENCOUNTER — Other Ambulatory Visit (HOSPITAL_BASED_OUTPATIENT_CLINIC_OR_DEPARTMENT_OTHER): Payer: Medicare PPO

## 2014-12-12 ENCOUNTER — Ambulatory Visit (HOSPITAL_BASED_OUTPATIENT_CLINIC_OR_DEPARTMENT_OTHER): Payer: Medicare PPO

## 2014-12-12 VITALS — BP 119/67 | HR 93 | Temp 97.9°F | Resp 16 | Ht 65.0 in | Wt 137.0 lb

## 2014-12-12 DIAGNOSIS — C659 Malignant neoplasm of unspecified renal pelvis: Secondary | ICD-10-CM | POA: Diagnosis not present

## 2014-12-12 DIAGNOSIS — C7951 Secondary malignant neoplasm of bone: Secondary | ICD-10-CM

## 2014-12-12 DIAGNOSIS — C649 Malignant neoplasm of unspecified kidney, except renal pelvis: Secondary | ICD-10-CM

## 2014-12-12 DIAGNOSIS — Z5112 Encounter for antineoplastic immunotherapy: Secondary | ICD-10-CM

## 2014-12-12 DIAGNOSIS — C642 Malignant neoplasm of left kidney, except renal pelvis: Secondary | ICD-10-CM | POA: Diagnosis not present

## 2014-12-12 DIAGNOSIS — J45901 Unspecified asthma with (acute) exacerbation: Secondary | ICD-10-CM

## 2014-12-12 LAB — CBC WITH DIFFERENTIAL (CANCER CENTER ONLY)
BASO#: 0 10*3/uL (ref 0.0–0.2)
BASO%: 0.3 % (ref 0.0–2.0)
EOS%: 0.1 % (ref 0.0–7.0)
Eosinophils Absolute: 0 10*3/uL (ref 0.0–0.5)
HCT: 35.2 % (ref 34.8–46.6)
HGB: 11.3 g/dL — ABNORMAL LOW (ref 11.6–15.9)
LYMPH#: 0.5 10*3/uL — ABNORMAL LOW (ref 0.9–3.3)
LYMPH%: 6.2 % — AB (ref 14.0–48.0)
MCH: 30.3 pg (ref 26.0–34.0)
MCHC: 32.1 g/dL (ref 32.0–36.0)
MCV: 94 fL (ref 81–101)
MONO#: 0.4 10*3/uL (ref 0.1–0.9)
MONO%: 4.7 % (ref 0.0–13.0)
NEUT#: 6.7 10*3/uL — ABNORMAL HIGH (ref 1.5–6.5)
NEUT%: 88.7 % — AB (ref 39.6–80.0)
Platelets: 256 10*3/uL (ref 145–400)
RBC: 3.73 10*6/uL (ref 3.70–5.32)
RDW: 17.2 % — ABNORMAL HIGH (ref 11.1–15.7)
WBC: 7.6 10*3/uL (ref 3.9–10.0)

## 2014-12-12 LAB — CMP (CANCER CENTER ONLY)
ALBUMIN: 3.8 g/dL (ref 3.3–5.5)
ALK PHOS: 58 U/L (ref 26–84)
ALT: 18 U/L (ref 10–47)
AST: 28 U/L (ref 11–38)
BUN: 20 mg/dL (ref 7–22)
CO2: 24 mEq/L (ref 18–33)
CREATININE: 1.7 mg/dL — AB (ref 0.6–1.2)
Calcium: 9.4 mg/dL (ref 8.0–10.3)
Chloride: 102 mEq/L (ref 98–108)
Glucose, Bld: 110 mg/dL (ref 73–118)
Potassium: 4.7 mEq/L (ref 3.3–4.7)
SODIUM: 138 meq/L (ref 128–145)
TOTAL PROTEIN: 7.3 g/dL (ref 6.4–8.1)
Total Bilirubin: 0.6 mg/dl (ref 0.20–1.60)

## 2014-12-12 LAB — TSH CHCC: TSH: 2.022 m[IU]/L (ref 0.308–3.960)

## 2014-12-12 LAB — LACTATE DEHYDROGENASE: LDH: 176 U/L (ref 94–250)

## 2014-12-12 MED ORDER — METHYLPREDNISOLONE SODIUM SUCC 125 MG IJ SOLR
80.0000 mg | Freq: Once | INTRAMUSCULAR | Status: AC
  Administered 2014-12-12: 80 mg via INTRAVENOUS

## 2014-12-12 MED ORDER — NIVOLUMAB CHEMO INJECTION 100 MG/10ML
240.0000 mg | Freq: Once | INTRAVENOUS | Status: AC
  Administered 2014-12-12: 240 mg via INTRAVENOUS
  Filled 2014-12-12: qty 8

## 2014-12-12 MED ORDER — SODIUM CHLORIDE 0.9 % IV SOLN
Freq: Once | INTRAVENOUS | Status: AC
  Administered 2014-12-12: 12:00:00 via INTRAVENOUS

## 2014-12-12 MED ORDER — METHYLPREDNISOLONE SODIUM SUCC 40 MG IJ SOLR
INTRAMUSCULAR | Status: AC
  Filled 2014-12-12: qty 2

## 2014-12-12 NOTE — Telephone Encounter (Signed)
[employer] [place]  APPROVED Auth: [id number] Valid: 11/23/2014 - 02/24/2015 [id number] - [employer]   APPROVED Auth: [id number] Valid: 02/25/2015 - 11/22/2015 [id number] Armida Sans

## 2014-12-12 NOTE — Patient Instructions (Signed)
Nivolumab injection What is this medicine? NIVOLUMAB (nye VOL ue mab) is a monoclonal antibody. It is used to treat melanoma, lung cancer, kidney cancer, and Hodgkin lymphoma. This medicine may be used for other purposes; ask your health care provider or pharmacist if you have questions. What should I tell my health care provider before I take this medicine? They need to know if you have any of these conditions: -diabetes -immune system problems -kidney disease -liver disease -lung disease -organ transplant -stomach or intestine problems -thyroid disease -an unusual or allergic reaction to nivolumab, other medicines, foods, dyes, or preservatives -pregnant or trying to get pregnant -breast-feeding How should I use this medicine? This medicine is for infusion into a vein. It is given by a health care professional in a hospital or clinic setting. A special MedGuide will be given to you before each treatment. Be sure to read this information carefully each time. Talk to your pediatrician regarding the use of this medicine in children. Special care may be needed. Overdosage: If you think you have taken too much of this medicine contact a poison control center or emergency room at once. NOTE: This medicine is only for you. Do not share this medicine with others. What if I miss a dose? It is important not to miss your dose. Call your doctor or health care professional if you are unable to keep an appointment. What may interact with this medicine? Interactions have not been studied. Give your health care provider a list of all the medicines, herbs, non-prescription drugs, or dietary supplements you use. Also tell them if you smoke, drink alcohol, or use illegal drugs. Some items may interact with your medicine. This list may not describe all possible interactions. Give your health care provider a list of all the medicines, herbs, non-prescription drugs, or dietary supplements you use. Also tell  them if you smoke, drink alcohol, or use illegal drugs. Some items may interact with your medicine. What should I watch for while using this medicine? This drug may make you feel generally unwell. Continue your course of treatment even though you feel ill unless your doctor tells you to stop. You may need blood work done while you are taking this medicine. Do not become pregnant while taking this medicine or for 5 months after stopping it. Women should inform their doctor if they wish to become pregnant or think they might be pregnant. There is a potential for serious side effects to an unborn child. Talk to your health care professional or pharmacist for more information. Do not breast-feed an infant while taking this medicine. What side effects may I notice from receiving this medicine? Side effects that you should report to your doctor or health care professional as soon as possible: -allergic reactions like skin rash, itching or hives, swelling of the face, lips, or tongue -black, tarry stools -blood in the urine -bloody or watery diarrhea -changes in vision -change in sex drive -changes in emotions or moods -chest pain -confusion -cough -decreased appetite -diarrhea -facial flushing -feeling faint or lightheaded -fever, chills -hair loss -hallucination, loss of contact with reality -headache -irritable -joint pain -loss of memory -muscle pain -muscle weakness -seizures -shortness of breath -signs and symptoms of high blood sugar such as dizziness; dry mouth; dry skin; fruity breath; nausea; stomach pain; increased hunger or thirst; increased urination -signs and symptoms of kidney injury like trouble passing urine or change in the amount of urine -signs and symptoms of liver injury like dark yellow or  brown urine; general ill feeling or flu-like symptoms; light-colored stools; loss of appetite; nausea; right upper belly pain; unusually weak or tired; yellowing of the eyes or  skin -stiff neck -swelling of the ankles, feet, hands -weight gain Side effects that usually do not require medical attention (report to your doctor or health care professional if they continue or are bothersome): -bone pain -constipation -tiredness -vomiting This list may not describe all possible side effects. Call your doctor for medical advice about side effects. You may report side effects to FDA at 1-800-FDA-1088. Where should I keep my medicine? This drug is given in a hospital or clinic and will not be stored at home. NOTE: This sheet is a summary. It may not cover all possible information. If you have questions about this medicine, talk to your doctor, pharmacist, or health care provider.    2016, Elsevier/Gold Standard. (2014-07-12 10:03:42) Methylprednisolone Solution for Injection What is this medicine? METHYLPREDNISOLONE (meth ill pred NISS oh lone) is a corticosteroid. It is commonly used to treat inflammation of the skin, joints, lungs, and other organs. Common conditions treated include asthma, allergies, and arthritis. It is also used for other conditions, such as blood disorders and diseases of the adrenal glands. This medicine may be used for other purposes; ask your health care provider or pharmacist if you have questions. What should I tell my health care provider before I take this medicine? They need to know if you have any of these conditions: -cataracts or glaucoma -Cushing's syndrome -heart disease -high blood pressure -infection including tuberculosis -low calcium or potassium levels in the blood -recent surgery -seizures -stomach or intestinal disease, including colitis -threadworms -thyroid problems -an unusual or allergic reaction to methylprednisolone, corticosteroids, benzyl alcohol, other medicines, foods, dyes, or preservatives -pregnant or trying to get pregnant -breast-feeding How should I use this medicine? This medicine is for injection or  infusion into a vein. It is also for injection into a muscle. It is given by a health care professional in a hospital or clinic setting. Talk to your pediatrician regarding the use of this medicine in children. While this drug may be prescribed for selected conditions, precautions do apply. Overdosage: If you think you have taken too much of this medicine contact a poison control center or emergency room at once. NOTE: This medicine is only for you. Do not share this medicine with others. What if I miss a dose? This does not apply. What may interact with this medicine? Do not take this medicine with any of the following medications: -mifepristone This medicine may also interact with the following medications: -aspirin and aspirin-like medicines -cyclosporin -ketoconazole -phenobarbital -phenytoin -rifampin -tacrolimus -troleandomycin -vaccines -warfarin This list may not describe all possible interactions. Give your health care provider a list of all the medicines, herbs, non-prescription drugs, or dietary supplements you use. Also tell them if you smoke, drink alcohol, or use illegal drugs. Some items may interact with your medicine. What should I watch for while using this medicine? Visit your doctor or health care professional for regular checks on your progress. If you are taking this medicine for a long time, carry an identification card with your name and address, the type and dose of your medicine, and your doctor's name and address. The medicine may increase your risk of getting an infection. Stay away from people who are sick. Tell your doctor or health care professional if you are around anyone with measles or chickenpox. You may need to avoid some vaccines. Talk  to your health care provider for more information. If you are going to have surgery, tell your doctor or health care professional that you have taken this medicine within the last twelve months. Ask your doctor or health  care professional about your diet. You may need to lower the amount of salt you eat. The medicine can increase your blood sugar. If you are a diabetic check with your doctor if you need help adjusting the dose of your diabetic medicine. What side effects may I notice from receiving this medicine? Side effects that you should report to your doctor or health care professional as soon as possible: -allergic reactions like skin rash, itching or hives, swelling of the face, lips, or tongue -bloody or tarry stools -changes in vision -eye pain or bulging eyes -fever, sore throat, sneezing, cough, or other signs of infection, wounds that will not heal -increased thirst -irregular heartbeat -muscle cramps -pain in hips, back, ribs, arms, shoulders, or legs -swelling of the ankles, feet, hands -trouble passing urine or change in the amount of urine -unusual bleeding or bruising -unusually weak or tired -weight gain or weight loss Side effects that usually do not require medical attention (report to your doctor or health care professional if they continue or are bothersome): -changes in emotions or moods -constipation or diarrhea -headache -irritation at site where injected -nausea, vomiting -skin problems, acne, thin and shiny skin -trouble sleeping -unusual hair growth on the face or body This list may not describe all possible side effects. Call your doctor for medical advice about side effects. You may report side effects to FDA at 1-800-FDA-1088. Where should I keep my medicine? This drug is given in a hospital or clinic and will not be stored at home. NOTE: This sheet is a summary. It may not cover all possible information. If you have questions about this medicine, talk to your doctor, pharmacist, or health care provider.    2016, Elsevier/Gold Standard. (2011-11-11 11:37:16)

## 2014-12-12 NOTE — Progress Notes (Signed)
Hematology and Oncology Follow Up Visit  Cathy Lewis [id number] 08/19/1945 69 y.o. 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 She did well with cycle 1 and had no complaints. She has a sinus infection at this time. This has exacerbated her asthma and she is taking Prednisone. She has a prescription for Bactrim that she has not started yet. She will start taking this today.  She still has a productive cough with brown sputum at times. She had a fever initially and went to the ED. Her work up was negative.  She is afebrile at this time. She does have some mild SOB with exertion.  Her back pain is somewhat better. She is wearing a hernia belt today for support instead of her full back brace. She denies chills, n/v, rash, dizziness, chest pain, palpitations, abdominal pain or changes in bowel or bladder habits. No blood in her stool.  No swelling, tenderness, numbness or tingling in her extremities. No new aches or pains.  She is eating well and staying hydrated. Her weight is stable.   Medications:    Medication List       This list is accurate as of: 12/12/14 11:51 AM.  Always use your most recent med list.               acetaminophen 500 MG tablet  Commonly known as:  TYLENOL  Take 1,000 mg by mouth every 6 (six) hours as needed. Pain     ADVAIR DISKUS 100-50 MCG/DOSE Aepb  Generic drug:  Fluticasone-Salmeterol  Inhale 1 puff into the lungs 2 (two) times daily.     albuterol 90 MCG/ACT inhaler  Commonly known as:  PROVENTIL,VENTOLIN  Inhale 2 puffs into the lungs every 4 (four) hours as needed. Wheezing     amLODipine 5 MG tablet  Commonly known as:  NORVASC  Take 5 mg by mouth daily.     CALCIUM + D PO  Take 1 tablet by mouth 2 (two) times daily.     fish oil-omega-3 fatty acids 1000 MG capsule  Take 1 g by mouth 2 (two) times daily.     FLONASE 50 MCG/ACT nasal spray  Generic drug:  fluticasone  Place 1 spray into the nose 2 (two) times daily as needed. Allergies     hydrochlorothiazide 25 MG tablet  Commonly known as:  HYDRODIURIL  Take 25 mg by mouth daily.     HYDROcodone-acetaminophen 5-325 MG tablet  Commonly known as:  NORCO/VICODIN  Take 1 tablet by mouth 3 (three) times daily as needed for moderate pain.     magnesium gluconate 500 MG tablet  Commonly known as:  MAGONATE  Take 500 mg by mouth daily.     meclizine 25 MG tablet  Commonly known as:  ANTIVERT  Take 12.5 mg by mouth 3 (three) times daily as needed. For dizziness.     methocarbamol 500 MG tablet  Commonly known as:  ROBAXIN  Take 500 mg by mouth every 6 (six) hours as needed. For pain.     montelukast 10 MG tablet  Commonly known as:  SINGULAIR  Take 10 mg by mouth at bedtime.     ondansetron 4 MG tablet  Commonly known as:  ZOFRAN  Take 1 tablet (4 mg total) by mouth every 6 (six) hours as needed for nausea or vomiting.     oxyCODONE 5 MG immediate release tablet  Commonly known as:  Oxy IR/ROXICODONE  Take 1 tablet (5 mg total) by mouth every 4 (four) hours as needed for moderate pain, severe pain or breakthrough pain.     rosuvastatin 10 MG tablet  Commonly known as:  CRESTOR  Take 10 mg by mouth at bedtime.     sulfamethoxazole-trimethoprim 800-160 MG tablet  Commonly known as:  BACTRIM DS,SEPTRA DS  Take 1 tablet by mouth 2 (two) times daily.     vitamin B-12 1000 MCG tablet  Commonly known as:  CYANOCOBALAMIN  Take 1,000 mcg by mouth daily.     vitamin C 1000 MG tablet  Take 1,000 mg by mouth daily.        Allergies:  Allergies  Allergen Reactions  . Azithromycin   . Gnp [Acetaminophen]     "GNR" (cough syrup)   . Tramadol Nausea And Vomiting and Other (See Comments)    dizziness   . Avelox [Moxifloxacin Hcl In Nacl]  Palpitations and Other (See Comments)    Body ache   . Etodolac Palpitations and Other (See Comments)    Joints hurt    Past Medical History, Surgical history, Social history, and Family History were reviewed and updated.  Review of Systems: All other 10 point review of systems is negative.   Physical Exam:  height is 5\' 5"  (1.651 m) and weight is 137 lb (62.143 kg). Her oral temperature is 97.9 F (36.6 C). Her blood pressure is 119/67 and her pulse is 93. Her respiration is 16.   Wt Readings from Last 3 Encounters:  12/12/14 137 lb (62.143 kg)  11/20/14 139 lb (63.05 kg)  01/03/14 135 lb (61.236 kg)    Ocular: Sclerae unicteric, pupils equal, round and reactive to light Ear-nose-throat: Oropharynx clear, dentition fair Lymphatic: No cervical or supraclavicular adenopathy Lungs no rales or rhonchi, good excursion bilaterally Heart regular rate and rhythm, no murmur appreciated Abd soft, nontender, positive bowel sounds MSK Chronic back pain and tenderness due to metastatic disease, no joint edema Neuro: non-focal, well-oriented, appropriate affect Breasts: Deferred  Lab Results  Component Value Date   WBC 7.6 12/12/2014   HGB 11.3* 12/12/2014   HCT 35.2 12/12/2014   MCV 94 12/12/2014   PLT 256 12/12/2014   No results found for: FERRITIN, IRON, TIBC, UIBC, IRONPCTSAT Lab Results  Component Value Date   RBC 3.73 12/12/2014   No results found for: KPAFRELGTCHN, LAMBDASER, KAPLAMBRATIO No results found for: Kandis Cocking, IGMSERUM No results found for: Odetta Pink, SPEI   Chemistry      Component Value Date/Time   NA 138 12/12/2014 1019   NA 133* 12/01/2014 2017   K 4.7 12/12/2014 1019   K 3.7 12/01/2014 2017   CL 102 12/12/2014 1019   CL 101 12/01/2014 2017   CO2 24 12/12/2014 1019   CO2 26 12/01/2014 2017   BUN 20 12/12/2014 1019   BUN 21* 12/01/2014 2017   CREATININE 1.7* 12/12/2014 1019   CREATININE  1.44* 12/01/2014 2017   GLU 104 07/20/2008      Component Value Date/Time   CALCIUM 9.4 12/12/2014 1019   CALCIUM 9.1 12/01/2014 2017   ALKPHOS 58 12/12/2014 1019   ALKPHOS 61 12/01/2014 2017   AST 28 12/12/2014 1019   AST 20 12/01/2014 2017  ALT 18 12/12/2014 1019   ALT 13* 12/01/2014 2017   BILITOT 0.60 12/12/2014 1019   BILITOT 0.7 12/01/2014 2017     Impression and Plan: Ms. Gomez is a very pleasant 69 yo white female with metastatic clear cell renal carcinomal with metastatic lesions to T12, L2, L3, L5 and within the left sacrum (diagnosed May, 2013).  She has had palliative radiation to the spine and left scapula. Her most recent MRI showed progression of spinal and left scapula lesions.  She has now completed 1 cycle of Opdivo and did well with therapy. We will proceed with cycle 2 today as planned.  She will finish her antibiotic and prednisone for her upper respiratory infections.  We will plan to repeat scans after her 4th cycle.  She has her current treatment and appointment schedule.  She knows to contact us with any questions or concerns. We can certainly see her sooner if need be.   Eliezer Bottom, NP 10/18/201611:51 AM

## 2014-12-18 ENCOUNTER — Telehealth: Payer: Self-pay | Admitting: Emergency Medicine

## 2014-12-18 NOTE — Telephone Encounter (Signed)
Patient called with complaints of continued sinus congestion; states saw PCP on 10/21 and instructed to start Mucinex. States that she feels worse; denies fever. States she is debating on seeing an asthma MD. Advised patient to call for any new sx or fever. Patient verbalized understanding.

## 2014-12-25 ENCOUNTER — Telehealth: Payer: Self-pay | Admitting: *Deleted

## 2014-12-25 NOTE — Telephone Encounter (Signed)
Patient is having continued issues with sinus congestion and she is requesting to see Dr Marin Olp tomorrow before getting treatment. Spoke to Dr Marin Olp and he will see her in between patients. Notified patient and made appointment.

## 2014-12-26 ENCOUNTER — Encounter: Payer: Self-pay | Admitting: Hematology & Oncology

## 2014-12-26 ENCOUNTER — Ambulatory Visit (HOSPITAL_BASED_OUTPATIENT_CLINIC_OR_DEPARTMENT_OTHER): Payer: Medicare PPO

## 2014-12-26 ENCOUNTER — Ambulatory Visit: Payer: Medicare PPO

## 2014-12-26 ENCOUNTER — Ambulatory Visit (HOSPITAL_BASED_OUTPATIENT_CLINIC_OR_DEPARTMENT_OTHER): Payer: Medicare PPO | Admitting: Hematology & Oncology

## 2014-12-26 VITALS — BP 118/69 | HR 79 | Temp 97.9°F | Resp 16 | Ht 65.0 in | Wt 137.0 lb

## 2014-12-26 DIAGNOSIS — C642 Malignant neoplasm of left kidney, except renal pelvis: Secondary | ICD-10-CM

## 2014-12-26 DIAGNOSIS — C7951 Secondary malignant neoplasm of bone: Principal | ICD-10-CM

## 2014-12-26 DIAGNOSIS — Z5112 Encounter for antineoplastic immunotherapy: Secondary | ICD-10-CM

## 2014-12-26 DIAGNOSIS — C649 Malignant neoplasm of unspecified kidney, except renal pelvis: Secondary | ICD-10-CM

## 2014-12-26 MED ORDER — SODIUM CHLORIDE 0.9 % IV SOLN
Freq: Once | INTRAVENOUS | Status: AC
  Administered 2014-12-26: 13:00:00 via INTRAVENOUS

## 2014-12-26 MED ORDER — SODIUM CHLORIDE 0.9 % IV SOLN
240.0000 mg | Freq: Once | INTRAVENOUS | Status: AC
  Administered 2014-12-26: 240 mg via INTRAVENOUS
  Filled 2014-12-26: qty 8

## 2014-12-26 NOTE — Patient Instructions (Signed)
Nivolumab injection What is this medicine? NIVOLUMAB (nye VOL ue mab) is a monoclonal antibody. It is used to treat melanoma, lung cancer, kidney cancer, and Hodgkin lymphoma. This medicine may be used for other purposes; ask your health care provider or pharmacist if you have questions. What should I tell my health care provider before I take this medicine? They need to know if you have any of these conditions: -diabetes -immune system problems -kidney disease -liver disease -lung disease -organ transplant -stomach or intestine problems -thyroid disease -an unusual or allergic reaction to nivolumab, other medicines, foods, dyes, or preservatives -pregnant or trying to get pregnant -breast-feeding How should I use this medicine? This medicine is for infusion into a vein. It is given by a health care professional in a hospital or clinic setting. A special MedGuide will be given to you before each treatment. Be sure to read this information carefully each time. Talk to your pediatrician regarding the use of this medicine in children. Special care may be needed. Overdosage: If you think you have taken too much of this medicine contact a poison control center or emergency room at once. NOTE: This medicine is only for you. Do not share this medicine with others. What if I miss a dose? It is important not to miss your dose. Call your doctor or health care professional if you are unable to keep an appointment. What may interact with this medicine? Interactions have not been studied. Give your health care provider a list of all the medicines, herbs, non-prescription drugs, or dietary supplements you use. Also tell them if you smoke, drink alcohol, or use illegal drugs. Some items may interact with your medicine. This list may not describe all possible interactions. Give your health care provider a list of all the medicines, herbs, non-prescription drugs, or dietary supplements you use. Also tell  them if you smoke, drink alcohol, or use illegal drugs. Some items may interact with your medicine. What should I watch for while using this medicine? This drug may make you feel generally unwell. Continue your course of treatment even though you feel ill unless your doctor tells you to stop. You may need blood work done while you are taking this medicine. Do not become pregnant while taking this medicine or for 5 months after stopping it. Women should inform their doctor if they wish to become pregnant or think they might be pregnant. There is a potential for serious side effects to an unborn child. Talk to your health care professional or pharmacist for more information. Do not breast-feed an infant while taking this medicine. What side effects may I notice from receiving this medicine? Side effects that you should report to your doctor or health care professional as soon as possible: -allergic reactions like skin rash, itching or hives, swelling of the face, lips, or tongue -black, tarry stools -blood in the urine -bloody or watery diarrhea -changes in vision -change in sex drive -changes in emotions or moods -chest pain -confusion -cough -decreased appetite -diarrhea -facial flushing -feeling faint or lightheaded -fever, chills -hair loss -hallucination, loss of contact with reality -headache -irritable -joint pain -loss of memory -muscle pain -muscle weakness -seizures -shortness of breath -signs and symptoms of high blood sugar such as dizziness; dry mouth; dry skin; fruity breath; nausea; stomach pain; increased hunger or thirst; increased urination -signs and symptoms of kidney injury like trouble passing urine or change in the amount of urine -signs and symptoms of liver injury like dark yellow or   brown urine; general ill feeling or flu-like symptoms; light-colored stools; loss of appetite; nausea; right upper belly pain; unusually weak or tired; yellowing of the eyes or  skin -stiff neck -swelling of the ankles, feet, hands -weight gain Side effects that usually do not require medical attention (report to your doctor or health care professional if they continue or are bothersome): -bone pain -constipation -tiredness -vomiting This list may not describe all possible side effects. Call your doctor for medical advice about side effects. You may report side effects to FDA at 1-800-FDA-1088. Where should I keep my medicine? This drug is given in a hospital or clinic and will not be stored at home. NOTE: This sheet is a summary. It may not cover all possible information. If you have questions about this medicine, talk to your doctor, pharmacist, or health care provider.    2016, [employer]vier/Gold Standard. (2014-07-12 10:03:42)  

## 2014-12-28 ENCOUNTER — Telehealth: Payer: Self-pay | Admitting: Hematology & Oncology

## 2014-12-28 NOTE — Telephone Encounter (Signed)
[employer] APPROVED w Dr. Johnette Abraham [id number] [place] See Auth: [id number] Valid: 01/08/2015 - 07/07/2015  [employer] [place]  Pending Auth: [id number] Valid: 01/07/2015 - 02/06/2015  [id number] See   F: [phone] P: [phone]  Dx: C79.51, C64.9   [employer] [place]  Pending Auth: [id number] Valid: 02/25/2015 - 01/08/2016 [id number]- [place] See   F: [phone] P: [phone]  Dx: C79.51, C64.9  Nurse Angie [phone]

## 2014-12-28 NOTE — Progress Notes (Signed)
Hematology and Oncology Follow Up Visit  Cathy Lewis [id number] Jul 04, 1945 69 y.o. 12/28/2014   Principle Diagnosis:   Metastatic renal cell carcinoma  Current Therapy:    Status post 2 cycles of Nivolumab     Interim History:  Cathy Lewis is back for follow-up. So her, she seems to have done fairly well with the Nivolumab. It still is hard to say whether or not it is working. Rashes when I complain much in the way of pain. Patient had no cough. She's had no diarrhea. She has had no fever. She has had some nausea but no vomiting.  There's been no rashes. She's had no leg swelling.  She's had no headache.  Overall, her performance status is ECOG 1.  Medications:  Current outpatient prescriptions:  .  acetaminophen (TYLENOL) 500 MG tablet, Take 1,000 mg by mouth every 6 (six) hours as needed. Pain , Disp: , Rfl:  .  albuterol (PROVENTIL,VENTOLIN) 90 MCG/ACT inhaler, Inhale 2 puffs into the lungs every 4 (four) hours as needed. Wheezing , Disp: , Rfl:  .  amLODipine (NORVASC) 5 MG tablet, Take 5 mg by mouth daily., Disp: , Rfl:  .  Ascorbic Acid (VITAMIN C) 1000 MG tablet, Take 1,000 mg by mouth daily. , Disp: , Rfl:  .  Calcium Carbonate-Vitamin D (CALCIUM + D PO), Take 1 tablet by mouth 2 (two) times daily. , Disp: , Rfl:  .  fish oil-omega-3 fatty acids 1000 MG capsule, Take 1 g by mouth 2 (two) times daily. , Disp: , Rfl:  .  fluticasone (FLONASE) 50 MCG/ACT nasal spray, Place 1 spray into the nose 2 (two) times daily as needed. Allergies , Disp: , Rfl:  .  Fluticasone-Salmeterol (ADVAIR DISKUS) 100-50 MCG/DOSE AEPB, Inhale 1 puff into the lungs 2 (two) times daily. , Disp: , Rfl:  .  hydrochlorothiazide (HYDRODIURIL) 25 MG tablet, Take 25 mg by mouth daily., Disp: , Rfl:  .  HYDROcodone-acetaminophen (NORCO/VICODIN) 5-325 MG per tablet, Take 1 tablet by mouth 3 (three) times daily as needed for moderate pain. , Disp: , Rfl: 0 .  magnesium gluconate (MAGONATE) 500 MG tablet,  Take 500 mg by mouth daily., Disp: , Rfl:  .  meclizine (ANTIVERT) 25 MG tablet, Take 12.5 mg by mouth 3 (three) times daily as needed. For dizziness., Disp: , Rfl:  .  methocarbamol (ROBAXIN) 500 MG tablet, Take 500 mg by mouth every 6 (six) hours as needed. For pain., Disp: , Rfl:  .  montelukast (SINGULAIR) 10 MG tablet, Take 10 mg by mouth at bedtime. , Disp: , Rfl:  .  ondansetron (ZOFRAN) 4 MG tablet, Take 1 tablet (4 mg total) by mouth every 6 (six) hours as needed for nausea or vomiting., Disp: 20 tablet, Rfl: 0 .  oxyCODONE (OXY IR/ROXICODONE) 5 MG immediate release tablet, Take 1 tablet (5 mg total) by mouth every 4 (four) hours as needed for moderate pain, severe pain or breakthrough pain., Disp: 30 tablet, Rfl: 0 .  predniSONE (DELTASONE) 20 MG tablet, , Disp: , Rfl:  .  rosuvastatin (CRESTOR) 10 MG tablet, Take 10 mg by mouth at bedtime. , Disp: , Rfl:  .  sulfamethoxazole-trimethoprim (BACTRIM DS,SEPTRA DS) 800-160 MG tablet, Take 1 tablet by mouth 2 (two) times daily., Disp: 20 tablet, Rfl: 0 .  vitamin B-12 (CYANOCOBALAMIN) 1000 MCG tablet, Take 1,000 mcg by mouth daily. , Disp: , Rfl:  .  [DISCONTINUED] enoxaparin (LOVENOX) 30 MG/0.3ML SOLN, Inject 0.3 mLs (30 mg total)  into the skin every 12 (twelve) hours., Disp: 28 Syringe, Rfl: 0 .  [DISCONTINUED] famotidine (PEPCID) 20 MG tablet, Take 20 mg by mouth 2 (two) times daily as needed. Heart burn , Disp: , Rfl:  .  [DISCONTINUED] ferrous sulfate 325 (65 FE) MG tablet, Take 1 tablet (325 mg total) by mouth 3 (three) times daily after meals., Disp: 90 tablet, Rfl: 0  Allergies:  Allergies  Allergen Reactions  . Azithromycin   . Gnp [Acetaminophen]     "GNR" (cough syrup)   . Tramadol Nausea And Vomiting and Other (See Comments)    dizziness   . Avelox [Moxifloxacin Hcl In Nacl] Palpitations and Other (See Comments)    Body ache   . Etodolac Palpitations and Other (See Comments)    Joints hurt    Past Medical History,  Surgical history, Social history, and Family History were reviewed and updated.  Review of Systems: As above  Physical Exam:  height is 5\' 5"  (1.651 m) and weight is 137 lb (62.143 kg). Her oral temperature is 97.9 F (36.6 C). Her blood pressure is 118/69 and her pulse is 79. Her respiration is 16.   Wt Readings from Last 3 Encounters:  12/26/14 137 lb (62.143 kg)  12/12/14 137 lb (62.143 kg)  11/20/14 139 lb (63.05 kg)     Well-developed and well-nourished white female in no obvious distress. Head and neck exam shows no ocular or oral lesions. There are no palpable cervical or supraclavicular lymph nodes. Lungs are clear. Cardiac exam regular rate and rhythm with no murmurs, rubs or bruits. Abdomen is soft. She has good bowel sounds. There is no fluid wave. There is no palpable hepatosplenomegaly. Back exam shows no obvious tenderness over the spine. She has no muscle spasms in his back. Extremity shows no clubbing, cyanosis or edema. Neurological exam shows no focal neurological deficits. Skin exam shows no rashes, ecchymoses or petechia.  Lab Results  Component Value Date   WBC 7.6 12/12/2014   HGB 11.3* 12/12/2014   HCT 35.2 12/12/2014   MCV 94 12/12/2014   PLT 256 12/12/2014     Chemistry      Component Value Date/Time   NA 138 12/12/2014 1019   NA 133* 12/01/2014 2017   K 4.7 12/12/2014 1019   K 3.7 12/01/2014 2017   CL 102 12/12/2014 1019   CL 101 12/01/2014 2017   CO2 24 12/12/2014 1019   CO2 26 12/01/2014 2017   BUN 20 12/12/2014 1019   BUN 21* 12/01/2014 2017   CREATININE 1.7* 12/12/2014 1019   CREATININE 1.44* 12/01/2014 2017   GLU 104 07/20/2008      Component Value Date/Time   CALCIUM 9.4 12/12/2014 1019   CALCIUM 9.1 12/01/2014 2017   ALKPHOS 58 12/12/2014 1019   ALKPHOS 61 12/01/2014 2017   AST 28 12/12/2014 1019   AST 20 12/01/2014 2017   ALT 18 12/12/2014 1019   ALT 13* 12/01/2014 2017   BILITOT 0.60 12/12/2014 1019   BILITOT 0.7 12/01/2014 2017           Impression and Plan: Cathy Lewis is a 69 year old white female with metastatic renal cell carcinoma. This is clear-cell carcinoma. She was diagnosed back in 2013. Pressure actually has done fairly well. She did have a left nephrectomy back in January 2014. She has had radiation therapy.  We will continue with the Opdivo. After her fourth cycle, we will then plan to rescan her and see how things look.  I  reassured her that some of the complaints that she has typically or not from [place]. I checked with our pharmacist about this.  I will plan to see her back in a couple weeks.  I'm just glad that her quality of life is doing well.  I spent about 30 minutes with her today.  Dierdre Searles, MD 11/3/20167:14 Cherylin Mylar

## 2015-01-01 ENCOUNTER — Institutional Professional Consult (permissible substitution): Payer: Medicare PPO | Admitting: Internal Medicine

## 2015-01-08 ENCOUNTER — Ambulatory Visit (HOSPITAL_BASED_OUTPATIENT_CLINIC_OR_DEPARTMENT_OTHER): Payer: Medicare PPO

## 2015-01-08 ENCOUNTER — Ambulatory Visit (HOSPITAL_BASED_OUTPATIENT_CLINIC_OR_DEPARTMENT_OTHER): Payer: Medicare PPO | Admitting: Hematology & Oncology

## 2015-01-08 ENCOUNTER — Other Ambulatory Visit (HOSPITAL_BASED_OUTPATIENT_CLINIC_OR_DEPARTMENT_OTHER): Payer: Medicare PPO

## 2015-01-08 ENCOUNTER — Encounter: Payer: Self-pay | Admitting: Hematology & Oncology

## 2015-01-08 VITALS — BP 114/59 | HR 90 | Temp 98.0°F | Resp 14 | Ht 65.0 in | Wt 139.0 lb

## 2015-01-08 DIAGNOSIS — C7951 Secondary malignant neoplasm of bone: Secondary | ICD-10-CM | POA: Diagnosis not present

## 2015-01-08 DIAGNOSIS — C649 Malignant neoplasm of unspecified kidney, except renal pelvis: Secondary | ICD-10-CM

## 2015-01-08 DIAGNOSIS — Z5112 Encounter for antineoplastic immunotherapy: Secondary | ICD-10-CM

## 2015-01-08 LAB — CMP (CANCER CENTER ONLY)
ALT: 11 U/L (ref 10–47)
AST: 22 U/L (ref 11–38)
Albumin: 3.4 g/dL (ref 3.3–5.5)
Alkaline Phosphatase: 60 U/L (ref 26–84)
BILIRUBIN TOTAL: 0.7 mg/dL (ref 0.20–1.60)
BUN: 17 mg/dL (ref 7–22)
CALCIUM: 9 mg/dL (ref 8.0–10.3)
CO2: 29 meq/L (ref 18–33)
Chloride: 103 mEq/L (ref 98–108)
Creat: 1.5 mg/dl — ABNORMAL HIGH (ref 0.6–1.2)
GLUCOSE: 109 mg/dL (ref 73–118)
POTASSIUM: 3.4 meq/L (ref 3.3–4.7)
Sodium: 144 mEq/L (ref 128–145)
Total Protein: 7.2 g/dL (ref 6.4–8.1)

## 2015-01-08 LAB — CBC WITH DIFFERENTIAL (CANCER CENTER ONLY)
BASO#: 0 10*3/uL (ref 0.0–0.2)
BASO%: 0.8 % (ref 0.0–2.0)
EOS%: 2.4 % (ref 0.0–7.0)
Eosinophils Absolute: 0.1 10*3/uL (ref 0.0–0.5)
HEMATOCRIT: 37 % (ref 34.8–46.6)
HEMOGLOBIN: 11.7 g/dL (ref 11.6–15.9)
LYMPH#: 0.8 10*3/uL — AB (ref 0.9–3.3)
LYMPH%: 22.3 % (ref 14.0–48.0)
MCH: 29.5 pg (ref 26.0–34.0)
MCHC: 31.6 g/dL — AB (ref 32.0–36.0)
MCV: 93 fL (ref 81–101)
MONO#: 0.3 10*3/uL (ref 0.1–0.9)
MONO%: 7.7 % (ref 0.0–13.0)
NEUT%: 66.8 % (ref 39.6–80.0)
NEUTROS ABS: 2.5 10*3/uL (ref 1.5–6.5)
PLATELETS: 250 10*3/uL (ref 145–400)
RBC: 3.97 10*6/uL (ref 3.70–5.32)
RDW: 16.8 % — AB (ref 11.1–15.7)
WBC: 3.8 10*3/uL — AB (ref 3.9–10.0)

## 2015-01-08 LAB — LACTATE DEHYDROGENASE (CC13): LDH: 186 U/L (ref 125–245)

## 2015-01-08 MED ORDER — DIPHENHYDRAMINE HCL 50 MG/ML IJ SOLN: 50.0000 mg | Freq: Once | INTRAMUSCULAR | Status: DC | PRN

## 2015-01-08 MED ORDER — EPINEPHRINE HCL 0.1 MG/ML IJ SOLN: 0.2500 mg | Freq: Once | INTRAMUSCULAR | Status: DC | PRN

## 2015-01-08 MED ORDER — SODIUM CHLORIDE 0.9 % IV SOLN
240.0000 mg | Freq: Once | INTRAVENOUS | Status: AC
  Administered 2015-01-08: 240 mg via INTRAVENOUS
  Filled 2015-01-08: qty 20

## 2015-01-08 MED ORDER — SODIUM CHLORIDE 0.9 % IV SOLN: Freq: Once | INTRAVENOUS | Status: DC | PRN

## 2015-01-08 MED ORDER — DENOSUMAB 120 MG/1.7ML ~~LOC~~ SOLN: 120.0000 mg | Freq: Once | SUBCUTANEOUS | Status: DC

## 2015-01-08 MED ORDER — DIPHENHYDRAMINE HCL 50 MG/ML IJ SOLN: 25.0000 mg | Freq: Once | INTRAMUSCULAR | Status: DC | PRN

## 2015-01-08 MED ORDER — METHYLPREDNISOLONE SODIUM SUCC 125 MG IJ SOLR: 125.0000 mg | Freq: Once | INTRAMUSCULAR | Status: DC | PRN

## 2015-01-08 MED ORDER — SODIUM CHLORIDE 0.9 % IV SOLN
Freq: Once | INTRAVENOUS | Status: AC
  Administered 2015-01-08: 10:00:00 via INTRAVENOUS

## 2015-01-08 MED ORDER — ALBUTEROL SULFATE (2.5 MG/3ML) 0.083% IN NEBU
2.5000 mg | INHALATION_SOLUTION | Freq: Once | RESPIRATORY_TRACT | Status: DC | PRN
  Filled 2015-01-08: qty 3

## 2015-01-08 NOTE — Progress Notes (Signed)
Hematology and Oncology Follow Up Visit  Cathy Lewis [id number] 01-Apr-1945 69 y.o. 01/08/2015   Principle Diagnosis:   Metastatic renal cell carcinoma  Current Therapy:    Status post 2 cycles of Nivolumab     Interim History:  Cathy Lewis is back for follow-up. She is feeling better. We'll last saw her, she was having a lot of issues. She saw her family doctor. He thought that she has some kind of viral illness. She finally got over this.  She is having some discomfort in the left shoulder. She has known metastatic disease to that shoulder. She has had a problem nausea or vomiting. She's had no change in bowel or bladder habits. She's had no cough. There's been no bleeding. She's had no headache. His no visual changes.  She's had no bruises. She's had no rashes.  Overall, her performance status is ECOG 1.  Medications:  Current outpatient prescriptions:  .  acetaminophen (TYLENOL) 500 MG tablet, Take 1,000 mg by mouth every 6 (six) hours as needed. Pain , Disp: , Rfl:  .  albuterol (PROVENTIL,VENTOLIN) 90 MCG/ACT inhaler, Inhale 2 puffs into the lungs every 4 (four) hours as needed. Wheezing , Disp: , Rfl:  .  amLODipine (NORVASC) 5 MG tablet, Take 5 mg by mouth daily., Disp: , Rfl:  .  Ascorbic Acid (VITAMIN C) 1000 MG tablet, Take 1,000 mg by mouth daily. , Disp: , Rfl:  .  Calcium Carbonate-Vitamin D (CALCIUM + D PO), Take 1 tablet by mouth 2 (two) times daily. , Disp: , Rfl:  .  fish oil-omega-3 fatty acids 1000 MG capsule, Take 1 g by mouth 2 (two) times daily. , Disp: , Rfl:  .  fluticasone (FLONASE) 50 MCG/ACT nasal spray, Place 1 spray into the nose 2 (two) times daily as needed. Allergies , Disp: , Rfl:  .  Fluticasone-Salmeterol (ADVAIR DISKUS) 100-50 MCG/DOSE AEPB, Inhale 1 puff into the lungs 2 (two) times daily. , Disp: , Rfl:  .  hydrochlorothiazide (HYDRODIURIL) 25 MG tablet, Take 25 mg by mouth daily., Disp: , Rfl:  .  HYDROcodone-acetaminophen (NORCO/VICODIN)  5-325 MG per tablet, Take 1 tablet by mouth 3 (three) times daily as needed for moderate pain. , Disp: , Rfl: 0 .  magnesium gluconate (MAGONATE) 500 MG tablet, Take 500 mg by mouth daily., Disp: , Rfl:  .  meclizine (ANTIVERT) 25 MG tablet, Take 12.5 mg by mouth 3 (three) times daily as needed. For dizziness., Disp: , Rfl:  .  montelukast (SINGULAIR) 10 MG tablet, Take 10 mg by mouth at bedtime. , Disp: , Rfl:  .  ondansetron (ZOFRAN) 4 MG tablet, Take 1 tablet (4 mg total) by mouth every 6 (six) hours as needed for nausea or vomiting., Disp: 20 tablet, Rfl: 0 .  vitamin B-12 (CYANOCOBALAMIN) 1000 MCG tablet, Take 1,000 mcg by mouth daily. , Disp: , Rfl:  .  oxyCODONE (OXY IR/ROXICODONE) 5 MG immediate release tablet, Take 1 tablet (5 mg total) by mouth every 4 (four) hours as needed for moderate pain, severe pain or breakthrough pain. (Patient not taking: Reported on 01/08/2015), Disp: 30 tablet, Rfl: 0 .  predniSONE (DELTASONE) 20 MG tablet, , Disp: , Rfl:  .  rosuvastatin (CRESTOR) 10 MG tablet, Take 10 mg by mouth at bedtime. , Disp: , Rfl:  .  [DISCONTINUED] enoxaparin (LOVENOX) 30 MG/0.3ML SOLN, Inject 0.3 mLs (30 mg total) into the skin every 12 (twelve) hours., Disp: 28 Syringe, Rfl: 0 .  [DISCONTINUED] famotidine (  PEPCID) 20 MG tablet, Take 20 mg by mouth 2 (two) times daily as needed. Heart burn , Disp: , Rfl:  .  [DISCONTINUED] ferrous sulfate 325 (65 FE) MG tablet, Take 1 tablet (325 mg total) by mouth 3 (three) times daily after meals., Disp: 90 tablet, Rfl: 0  Allergies:  Allergies  Allergen Reactions  . Azithromycin   . Gnp [Acetaminophen]     "GNR" (cough syrup)   . Tramadol Nausea And Vomiting and Other (See Comments)    dizziness   . Avelox [Moxifloxacin Hcl In Nacl] Palpitations and Other (See Comments)    Body ache   . Etodolac Palpitations and Other (See Comments)    Joints hurt    Past Medical History, Surgical history, Social history, and Family History were  reviewed and updated.  Review of Systems: As above  Physical Exam:  height is 5\' 5"  (1.651 m) and weight is 139 lb (63.05 kg). Her oral temperature is 98 F (36.7 C). Her blood pressure is 114/59 and her pulse is 90. Her respiration is 14.   Wt Readings from Last 3 Encounters:  01/08/15 139 lb (63.05 kg)  12/26/14 137 lb (62.143 kg)  12/12/14 137 lb (62.143 kg)     Well-developed and well-nourished white female in no obvious distress. Head and neck exam shows no ocular or oral lesions. There are no palpable cervical or supraclavicular lymph nodes. Lungs are clear. Cardiac exam regular rate and rhythm with no murmurs, rubs or bruits. Abdomen is soft. She has good bowel sounds. There is no fluid wave. There is no palpable hepatosplenomegaly. Back exam shows no obvious tenderness over the spine. She has no muscle spasms in his back. Extremity shows no clubbing, cyanosis or edema. Neurological exam shows no focal neurological deficits. Skin exam shows no rashes, ecchymoses or petechia.  Lab Results  Component Value Date   WBC 3.8* 01/08/2015   HGB 11.7 01/08/2015   HCT 37.0 01/08/2015   MCV 93 01/08/2015   PLT 250 01/08/2015     Chemistry      Component Value Date/Time   NA 144 01/08/2015 0818   NA 133* 12/01/2014 2017   K 3.4 01/08/2015 0818   K 3.7 12/01/2014 2017   CL 103 01/08/2015 0818   CL 101 12/01/2014 2017   CO2 29 01/08/2015 0818   CO2 26 12/01/2014 2017   BUN 17 01/08/2015 0818   BUN 21* 12/01/2014 2017   CREATININE 1.5* 01/08/2015 0818   CREATININE 1.44* 12/01/2014 2017   GLU 104 07/20/2008      Component Value Date/Time   CALCIUM 9.0 01/08/2015 0818   CALCIUM 9.1 12/01/2014 2017   ALKPHOS 60 01/08/2015 0818   ALKPHOS 61 12/01/2014 2017   AST 22 01/08/2015 0818   AST 20 12/01/2014 2017   ALT 11 01/08/2015 0818   ALT 13* 12/01/2014 2017   BILITOT 0.70 01/08/2015 0818   BILITOT 0.7 12/01/2014 2017         Impression and Plan: Cathy Lewis is a  69 year old white female with metastatic renal cell carcinoma. This is clear-cell carcinoma. She was diagnosed back in 2013.   This will be her fourth cycle of therapy. After this cycle, I will go ahead and rescan her. I will set this up in [facility].  I'm glad that she is feeling better.  I'll plan to get the CT scans in about 2 weeks and then see her back in 3 weeks.   I spent about 30 minutes  with her today.  Dierdre Searles, MD 11/14/20169:24 Cherylin Mylar

## 2015-01-08 NOTE — Patient Instructions (Signed)
[facility] Discharge Instructions for Patients Receiving Chemotherapy  Today you received the following chemotherapy agents Opdivo    If you develop nausea and vomiting that is not controlled by your nausea medication, call the clinic. If it is after clinic hours your family physician or the after hours number for the clinic or go to the Emergency Department.   BELOW ARE SYMPTOMS THAT SHOULD BE REPORTED IMMEDIATELY:  *FEVER GREATER THAN 100.5 F  *CHILLS WITH OR WITHOUT FEVER  NAUSEA AND VOMITING THAT IS NOT CONTROLLED WITH YOUR NAUSEA MEDICATION  *UNUSUAL SHORTNESS OF BREATH  *UNUSUAL BRUISING OR BLEEDING  TENDERNESS IN MOUTH AND THROAT WITH OR WITHOUT PRESENCE OF ULCERS  *URINARY PROBLEMS  *BOWEL PROBLEMS  UNUSUAL RASH Items with * indicate a potential emergency and should be followed up as soon as possible.  One of the nurses will contact you 24 hours after your treatment. Please let the nurse know about any problems that you may have experienced. Feel free to call the clinic you have any questions or concerns. The clinic phone number is [phone].   I have been informed and understand all the instructions given to me. I know to contact the clinic, my physician, or go to the Emergency Department if any problems should occur. I do not have any questions at this time, but understand that I may call the clinic during office hours   should I have any questions or need assistance in obtaining follow up care.    __________________________________________  _____________  __________ Signature of Patient or Authorized Representative            Date                   Time    __________________________________________ Nurse's Signature   Nivolumab injection What is this medicine? NIVOLUMAB (nye VOL ue mab) is used to treat certain types of melanoma and lung cancer. This medicine may be used for other purposes; ask your health care provider or pharmacist if  you have questions. COMMON BRAND NAME(S): Opdivo What should I tell my health care provider before I take this medicine? They need to know if you have any of these conditions: -eye disease, vision problems -history of pancreatitis -immune system problems -inflammatory bowel disease -kidney disease -liver disease -lung disease -lupus -myasthenia gravis -multiple sclerosis -organ transplant -stomach or intestine problems -thyroid disease -tingling of the fingers or toes, or other nerve disorder -an unusual or allergic reaction to nivolumab, other medicines, foods, dyes, or preservatives -pregnant or trying to get pregnant -breast-feeding How should I use this medicine? This medicine is for infusion into a vein. It is given by a health care professional in a hospital or clinic setting. A special MedGuide will be given to you before each treatment. Be sure to read this information carefully each time. Talk to your pediatrician regarding the use of this medicine in children. Special care may be needed. Overdosage: If you think you've taken too much of this medicine contact a poison control center or emergency room at once. Overdosage: If you think you have taken too much of this medicine contact a poison control center or emergency room at once. NOTE: This medicine is only for you. Do not share this medicine with others. What if I miss a dose? It is important not to miss your dose. Call your doctor or health care professional if you are unable to keep an appointment. What may interact with this medicine? Interactions have   not been studied. This list may not describe all possible interactions. Give your health care provider a list of all the medicines, herbs, non-prescription drugs, or dietary supplements you use. Also tell them if you smoke, drink alcohol, or use illegal drugs. Some items may interact with your medicine. What should I watch for while using this medicine? Tell your doctor  or healthcare professional if your symptoms do not start to get better or if they get worse. Your condition will be monitored carefully while you are receiving this medicine. You may need blood work done while you are taking this medicine. What side effects may I notice from receiving this medicine? Side effects that you should report to your doctor or health care professional as soon as possible: -allergic reactions like skin rash, itching or hives, swelling of the face, lips, or tongue -black, tarry stools -bloody or watery diarrhea -changes in vision -chills -cough -depressed mood -eye pain -feeling anxious -fever -general ill feeling or flu-like symptoms -hair loss -loss of appetite -low blood counts - this medicine may decrease the number of white blood cells, red blood cells and platelets. You may be at increased risk for infections and bleeding -pain, tingling, numbness in the hands or feet -redness, blistering, peeling or loosening of the skin, including inside the mouth -red pinpoint spots on skin -signs of decreased platelets or bleeding - bruising, pinpoint red spots on the skin, black, tarry stools, blood in the urine -signs of decreased red blood cells - unusually weak or tired, feeling faint or lightheaded, falls -signs of infection - fever or chills, cough, sore throat, pain or trouble passing urine -signs and symptoms of a dangerous change in heartbeat or heart rhythm like chest pain; dizziness; fast or irregular heartbeat; palpitations; feeling faint or lightheaded, falls; breathing problems -signs and symptoms of high blood sugar such as dizziness; dry mouth; dry skin; fruity breath; nausea; stomach pain; increased hunger or thirst; increased urination -signs and symptoms of kidney injury like trouble passing urine or change in the amount of urine -signs and symptoms of liver injury like dark yellow or brown urine; general ill feeling or flu-like symptoms; light-colored  stools; loss of appetite; nausea; right upper belly pain; unusually weak or tired; yellowing of the eyes or skin -signs and symptoms of increased potassium like muscle weakness; chest pain; or fast, irregular heartbeat -signs and symptoms of low potassium like muscle cramps or muscle pain; chest pain; dizziness; feeling faint or lightheaded, falls; palpitations; breathing problems; or fast, irregular heartbeat -swelling of the ankles, feet, hands -weight gainSide effects that usually do not require medical attention (report to your doctor or health care professional if they continue or are bothersome): -constipation -general ill feeling or flu-like symptoms -hair loss -loss of appetite -nausea, vomiting This list may not describe all possible side effects. Call your doctor for medical advice about side effects. You may report side effects to FDA at 1-800-FDA-1088. Where should I keep my medicine? This drug is given in a hospital or clinic and will not be stored at home. NOTE: This sheet is a summary. It may not cover all possible information. If you have questions about this medicine, talk to your doctor, pharmacist, or health care provider.  2015, [employer]vier/Gold Standard. (2013-05-02 13:18:19)    

## 2015-01-19 ENCOUNTER — Other Ambulatory Visit: Payer: Self-pay | Admitting: *Deleted

## 2015-01-19 DIAGNOSIS — C7951 Secondary malignant neoplasm of bone: Principal | ICD-10-CM

## 2015-01-19 DIAGNOSIS — C649 Malignant neoplasm of unspecified kidney, except renal pelvis: Secondary | ICD-10-CM

## 2015-01-23 ENCOUNTER — Ambulatory Visit: Payer: Medicare PPO

## 2015-01-23 ENCOUNTER — Other Ambulatory Visit: Payer: Medicare PPO

## 2015-02-06 ENCOUNTER — Other Ambulatory Visit (HOSPITAL_BASED_OUTPATIENT_CLINIC_OR_DEPARTMENT_OTHER): Payer: Medicare PPO

## 2015-02-06 ENCOUNTER — Ambulatory Visit: Payer: Medicare PPO | Admitting: Hematology & Oncology

## 2015-02-06 ENCOUNTER — Ambulatory Visit (HOSPITAL_BASED_OUTPATIENT_CLINIC_OR_DEPARTMENT_OTHER): Payer: Medicare PPO

## 2015-02-06 ENCOUNTER — Ambulatory Visit (HOSPITAL_BASED_OUTPATIENT_CLINIC_OR_DEPARTMENT_OTHER): Payer: Medicare PPO | Admitting: Family

## 2015-02-06 ENCOUNTER — Encounter: Payer: Self-pay | Admitting: Family

## 2015-02-06 ENCOUNTER — Ambulatory Visit: Payer: Medicare PPO

## 2015-02-06 ENCOUNTER — Encounter: Payer: Self-pay | Admitting: Hematology & Oncology

## 2015-02-06 ENCOUNTER — Other Ambulatory Visit: Payer: Medicare PPO

## 2015-02-06 ENCOUNTER — Telehealth: Payer: Self-pay | Admitting: Hematology & Oncology

## 2015-02-06 VITALS — BP 117/58 | HR 85 | Temp 97.7°F | Resp 16 | Ht 65.0 in | Wt 138.0 lb

## 2015-02-06 DIAGNOSIS — C649 Malignant neoplasm of unspecified kidney, except renal pelvis: Secondary | ICD-10-CM

## 2015-02-06 DIAGNOSIS — C7951 Secondary malignant neoplasm of bone: Secondary | ICD-10-CM

## 2015-02-06 DIAGNOSIS — D4412 Neoplasm of uncertain behavior of left adrenal gland: Secondary | ICD-10-CM | POA: Diagnosis not present

## 2015-02-06 DIAGNOSIS — R911 Solitary pulmonary nodule: Secondary | ICD-10-CM | POA: Diagnosis not present

## 2015-02-06 DIAGNOSIS — Z5112 Encounter for antineoplastic immunotherapy: Secondary | ICD-10-CM

## 2015-02-06 LAB — CBC WITH DIFFERENTIAL (CANCER CENTER ONLY)
BASO#: 0 10*3/uL (ref 0.0–0.2)
BASO%: 0.7 % (ref 0.0–2.0)
EOS ABS: 0.1 10*3/uL (ref 0.0–0.5)
EOS%: 3.4 % (ref 0.0–7.0)
HCT: 36.9 % (ref 34.8–46.6)
HGB: 11.8 g/dL (ref 11.6–15.9)
LYMPH#: 1 10*3/uL (ref 0.9–3.3)
LYMPH%: 23.1 % (ref 14.0–48.0)
MCH: 28.8 pg (ref 26.0–34.0)
MCHC: 32 g/dL (ref 32.0–36.0)
MCV: 90 fL (ref 81–101)
MONO#: 0.6 10*3/uL (ref 0.1–0.9)
MONO%: 13.7 % — AB (ref 0.0–13.0)
NEUT#: 2.5 10*3/uL (ref 1.5–6.5)
NEUT%: 59.1 % (ref 39.6–80.0)
PLATELETS: 215 10*3/uL (ref 145–400)
RBC: 4.1 10*6/uL (ref 3.70–5.32)
RDW: 16.8 % — AB (ref 11.1–15.7)
WBC: 4.2 10*3/uL (ref 3.9–10.0)

## 2015-02-06 LAB — CMP (CANCER CENTER ONLY)
ALT(SGPT): 15 U/L (ref 10–47)
AST: 22 U/L (ref 11–38)
Albumin: 3.5 g/dL (ref 3.3–5.5)
Alkaline Phosphatase: 53 U/L (ref 26–84)
BUN: 16 mg/dL (ref 7–22)
CHLORIDE: 97 meq/L — AB (ref 98–108)
CO2: 28 meq/L (ref 18–33)
CREATININE: 1.5 mg/dL — AB (ref 0.6–1.2)
Calcium: 9.8 mg/dL (ref 8.0–10.3)
Glucose, Bld: 94 mg/dL (ref 73–118)
POTASSIUM: 3.7 meq/L (ref 3.3–4.7)
SODIUM: 141 meq/L (ref 128–145)
TOTAL PROTEIN: 6.8 g/dL (ref 6.4–8.1)
Total Bilirubin: 0.7 mg/dl (ref 0.20–1.60)

## 2015-02-06 LAB — LACTATE DEHYDROGENASE: LDH: 189 U/L (ref 125–245)

## 2015-02-06 MED ORDER — SODIUM CHLORIDE 0.9 % IV SOLN
240.0000 mg | Freq: Once | INTRAVENOUS | Status: AC
  Administered 2015-02-06: 240 mg via INTRAVENOUS
  Filled 2015-02-06: qty 20

## 2015-02-06 MED ORDER — SODIUM CHLORIDE 0.9 % IV SOLN
Freq: Once | INTRAVENOUS | Status: AC
  Administered 2015-02-06: 10:00:00 via INTRAVENOUS

## 2015-02-06 NOTE — Progress Notes (Signed)
Hematology and Oncology Follow Up Visit  Cathy Lewis [id number] 11/04/1945 69 y.o. 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 She is still having back and left shoulder pain due to metastatic disease. She also has arthritis in her shoulder. She has had palliative radiation to these areas in the past.  Her Bone scan and CT last week showed progressive osseous metastasis. On CT she had a new 3 mm right middle lobe lung nodule as well asa developing left adrenal nodularity.  She has an appointment later this week with radiology for reevaluation.  She denies chills, n/v, rash, dizziness, chest pain, palpitations, abdominal pain or changes in bowel or bladder habits. No lymphadenopathy found on exam. No episodes of bleeding or bruising.  No swelling, tenderness, numbness or tingling in her extremities. No new aches or pains.  She is eating well and staying hydrated. Her weight is stable.   Medications:    Medication List       This list is accurate as of: 02/06/15  3:35 PM.  Always use your most recent med list.               acetaminophen 500 MG tablet  Commonly known as:  TYLENOL  Take 1,000 mg by mouth every 6 (six) hours as needed. Pain     ADVAIR DISKUS 100-50 MCG/DOSE Aepb  Generic drug:  Fluticasone-Salmeterol  Inhale 1 puff into the lungs 2 (two) times daily.     albuterol 90 MCG/ACT inhaler  Commonly known as:  PROVENTIL,VENTOLIN  Inhale 2 puffs into the lungs every 4 (four) hours as needed. Wheezing     amLODipine 5 MG tablet  Commonly known as:  NORVASC  Take 5 mg by mouth daily.     CALCIUM + D PO  Take 1 tablet by mouth 2 (two) times daily.     fish oil-omega-3 fatty acids 1000 MG capsule  Take 1 g by mouth 2 (two) times daily.       FLONASE 50 MCG/ACT nasal spray  Generic drug:  fluticasone  Place 1 spray into the nose 2 (two) times daily as needed. Allergies     hydrochlorothiazide 25 MG tablet  Commonly known as:  HYDRODIURIL  Take 25 mg by mouth daily.     HYDROcodone-acetaminophen 5-325 MG tablet  Commonly known as:  NORCO/VICODIN  Take 1 tablet by mouth 3 (three) times daily as needed for moderate pain.     magnesium gluconate 500 MG tablet  Commonly known as:  MAGONATE  Take 500 mg by mouth daily.     meclizine 25 MG tablet  Commonly known as:  ANTIVERT  Take 12.5 mg by mouth 3 (three) times daily as needed. For dizziness.     montelukast 10 MG tablet  Commonly known as:  SINGULAIR  Take 10 mg by mouth at bedtime.     ondansetron 4 MG tablet  Commonly known as:  ZOFRAN  Take 1 tablet (4 mg total) by mouth every 6 (six) hours as needed for nausea or vomiting.     oxyCODONE 5 MG immediate release tablet  Commonly known as:  Oxy IR/ROXICODONE  Take 1 tablet (5 mg total) by mouth every 4 (four) hours as needed for  moderate pain, severe pain or breakthrough pain.     predniSONE 20 MG tablet  Commonly known as:  DELTASONE     rosuvastatin 10 MG tablet  Commonly known as:  CRESTOR  Take 10 mg by mouth at bedtime.     vitamin B-12 1000 MCG tablet  Commonly known as:  CYANOCOBALAMIN  Take 1,000 mcg by mouth daily.     vitamin C 1000 MG tablet  Take 1,000 mg by mouth daily.        Allergies:  Allergies  Allergen Reactions  . Azithromycin   . Gnp [Acetaminophen]     "GNR" (cough syrup)   . Tramadol Nausea And Vomiting and Other (See Comments)    dizziness   . Avelox [Moxifloxacin Hcl In Nacl] Palpitations and Other (See Comments)    Body ache   . Etodolac Palpitations and Other (See Comments)    Joints hurt    Past Medical History, Surgical history, Social history, and Family History were reviewed and updated.  Review of Systems: All other 10 point review of systems is  negative.   Physical Exam:  height is 5\' 5"  (1.651 m) and weight is 138 lb (62.596 kg). Her oral temperature is 97.7 F (36.5 C). Her blood pressure is 117/58 and her pulse is 85. Her respiration is 16.   Wt Readings from Last 3 Encounters:  02/06/15 138 lb (62.596 kg)  01/08/15 139 lb (63.05 kg)  12/26/14 137 lb (62.143 kg)    Ocular: Sclerae unicteric, pupils equal, round and reactive to light Ear-nose-throat: Oropharynx clear, dentition fair Lymphatic: No cervical supraclavicular or axillary adenopathy Lungs no rales or rhonchi, good excursion bilaterally Heart regular rate and rhythm, no murmur appreciated Abd soft, nontender, positive bowel sounds MSK Chronic back pain and tenderness due to metastatic disease, no joint edema Neuro: non-focal, well-oriented, appropriate affect Breasts: Deferred  Lab Results  Component Value Date   WBC 4.2 02/06/2015   HGB 11.8 02/06/2015   HCT 36.9 02/06/2015   MCV 90 02/06/2015   PLT 215 02/06/2015   No results found for: FERRITIN, IRON, TIBC, UIBC, IRONPCTSAT Lab Results  Component Value Date   RBC 4.10 02/06/2015   No results found for: KPAFRELGTCHN, LAMBDASER, KAPLAMBRATIO No results found for: IGGSERUM, IGA, IGMSERUM No results found for: Odetta Pink, SPEI   Chemistry      Component Value Date/Time   NA 141 02/06/2015 0936   NA 133* 12/01/2014 2017   K 3.7 02/06/2015 0936   K 3.7 12/01/2014 2017   CL 97* 02/06/2015 0936   CL 101 12/01/2014 2017   CO2 28 02/06/2015 0936   CO2 26 12/01/2014 2017   BUN 16 02/06/2015 0936   BUN 21* 12/01/2014 2017   CREATININE 1.5* 02/06/2015 0936   CREATININE 1.44* 12/01/2014 2017   GLU 104 07/20/2008      Component Value Date/Time   CALCIUM 9.8 02/06/2015 0936   CALCIUM 9.1 12/01/2014 2017   ALKPHOS 53 02/06/2015 0936   ALKPHOS 61 12/01/2014 2017   AST 22 02/06/2015 0936   AST 20 12/01/2014 2017   ALT 15 02/06/2015 0936   ALT  13* 12/01/2014 2017   BILITOT 0.70 02/06/2015 0936   BILITOT 0.7 12/01/2014 2017     Impression and Plan: Cathy Lewis is a very pleasant 69 yo white female with metastatic clear cell renal carcinomal with metastatic lesions to T12, L2, L3, L5 and within the left sacrum (diagnosed May, 2013).  She has had palliative radiation to the spine and left scapula. CT scans last week showed progression of osseous metastatic diease. She also had a new 3 mm right middle lobe lung nodule and left adrenal nodule.   I spoke with [name] and it was decided to proceed with treatment today as planned.  She will see radiology this week for re-evaluation ad possibly more palliative radiation.  She has her current treatment and appointment schedule.  She knows to contact us with any questions or concerns. We can certainly see her sooner if need be.   Eliezer Bottom, NP 12/13/20163:35 PM

## 2015-02-06 NOTE — Telephone Encounter (Signed)
[facility] MCR AUTHCS:[id number] VALID: 02/22/15 - 05/23/15 [id number] NIVOLUMAB

## 2015-02-06 NOTE — Patient Instructions (Signed)
[facility] Discharge Instructions for Patients Receiving Chemotherapy  Today you received the following chemotherapy agents Opdivo    If you develop nausea and vomiting that is not controlled by your nausea medication, call the clinic. If it is after clinic hours your family physician or the after hours number for the clinic or go to the Emergency Department.   BELOW ARE SYMPTOMS THAT SHOULD BE REPORTED IMMEDIATELY:  *FEVER GREATER THAN 100.5 F  *CHILLS WITH OR WITHOUT FEVER  NAUSEA AND VOMITING THAT IS NOT CONTROLLED WITH YOUR NAUSEA MEDICATION  *UNUSUAL SHORTNESS OF BREATH  *UNUSUAL BRUISING OR BLEEDING  TENDERNESS IN MOUTH AND THROAT WITH OR WITHOUT PRESENCE OF ULCERS  *URINARY PROBLEMS  *BOWEL PROBLEMS  UNUSUAL RASH Items with * indicate a potential emergency and should be followed up as soon as possible.  One of the nurses will contact you 24 hours after your treatment. Please let the nurse know about any problems that you may have experienced. Feel free to call the clinic you have any questions or concerns. The clinic phone number is [phone].   I have been informed and understand all the instructions given to me. I know to contact the clinic, my physician, or go to the Emergency Department if any problems should occur. I do not have any questions at this time, but understand that I may call the clinic during office hours   should I have any questions or need assistance in obtaining follow up care.    __________________________________________  _____________  __________ Signature of Patient or Authorized Representative            Date                   Time    __________________________________________ Nurse's Signature   Nivolumab injection What is this medicine? NIVOLUMAB (nye VOL ue mab) is used to treat certain types of melanoma and lung cancer. This medicine may be used for other purposes; ask your health care provider or pharmacist if  you have questions. COMMON BRAND NAME(S): Opdivo What should I tell my health care provider before I take this medicine? They need to know if you have any of these conditions: -eye disease, vision problems -history of pancreatitis -immune system problems -inflammatory bowel disease -kidney disease -liver disease -lung disease -lupus -myasthenia gravis -multiple sclerosis -organ transplant -stomach or intestine problems -thyroid disease -tingling of the fingers or toes, or other nerve disorder -an unusual or allergic reaction to nivolumab, other medicines, foods, dyes, or preservatives -pregnant or trying to get pregnant -breast-feeding How should I use this medicine? This medicine is for infusion into a vein. It is given by a health care professional in a hospital or clinic setting. A special MedGuide will be given to you before each treatment. Be sure to read this information carefully each time. Talk to your pediatrician regarding the use of this medicine in children. Special care may be needed. Overdosage: If you think you've taken too much of this medicine contact a poison control center or emergency room at once. Overdosage: If you think you have taken too much of this medicine contact a poison control center or emergency room at once. NOTE: This medicine is only for you. Do not share this medicine with others. What if I miss a dose? It is important not to miss your dose. Call your doctor or health care professional if you are unable to keep an appointment. What may interact with this medicine? Interactions have   not been studied. This list may not describe all possible interactions. Give your health care provider a list of all the medicines, herbs, non-prescription drugs, or dietary supplements you use. Also tell them if you smoke, drink alcohol, or use illegal drugs. Some items may interact with your medicine. What should I watch for while using this medicine? Tell your doctor  or healthcare professional if your symptoms do not start to get better or if they get worse. Your condition will be monitored carefully while you are receiving this medicine. You may need blood work done while you are taking this medicine. What side effects may I notice from receiving this medicine? Side effects that you should report to your doctor or health care professional as soon as possible: -allergic reactions like skin rash, itching or hives, swelling of the face, lips, or tongue -black, tarry stools -bloody or watery diarrhea -changes in vision -chills -cough -depressed mood -eye pain -feeling anxious -fever -general ill feeling or flu-like symptoms -hair loss -loss of appetite -low blood counts - this medicine may decrease the number of white blood cells, red blood cells and platelets. You may be at increased risk for infections and bleeding -pain, tingling, numbness in the hands or feet -redness, blistering, peeling or loosening of the skin, including inside the mouth -red pinpoint spots on skin -signs of decreased platelets or bleeding - bruising, pinpoint red spots on the skin, black, tarry stools, blood in the urine -signs of decreased red blood cells - unusually weak or tired, feeling faint or lightheaded, falls -signs of infection - fever or chills, cough, sore throat, pain or trouble passing urine -signs and symptoms of a dangerous change in heartbeat or heart rhythm like chest pain; dizziness; fast or irregular heartbeat; palpitations; feeling faint or lightheaded, falls; breathing problems -signs and symptoms of high blood sugar such as dizziness; dry mouth; dry skin; fruity breath; nausea; stomach pain; increased hunger or thirst; increased urination -signs and symptoms of kidney injury like trouble passing urine or change in the amount of urine -signs and symptoms of liver injury like dark yellow or brown urine; general ill feeling or flu-like symptoms; light-colored  stools; loss of appetite; nausea; right upper belly pain; unusually weak or tired; yellowing of the eyes or skin -signs and symptoms of increased potassium like muscle weakness; chest pain; or fast, irregular heartbeat -signs and symptoms of low potassium like muscle cramps or muscle pain; chest pain; dizziness; feeling faint or lightheaded, falls; palpitations; breathing problems; or fast, irregular heartbeat -swelling of the ankles, feet, hands -weight gainSide effects that usually do not require medical attention (report to your doctor or health care professional if they continue or are bothersome): -constipation -general ill feeling or flu-like symptoms -hair loss -loss of appetite -nausea, vomiting This list may not describe all possible side effects. Call your doctor for medical advice about side effects. You may report side effects to FDA at 1-800-FDA-1088. Where should I keep my medicine? This drug is given in a hospital or clinic and will not be stored at home. NOTE: This sheet is a summary. It may not cover all possible information. If you have questions about this medicine, talk to your doctor, pharmacist, or health care provider.  2015, Elsevier/Gold Standard. (2013-05-02 13:18:19)    

## 2015-02-07 ENCOUNTER — Encounter: Payer: Self-pay | Admitting: Hematology & Oncology

## 2015-02-09 ENCOUNTER — Ambulatory Visit: Payer: Medicare PPO

## 2015-02-09 ENCOUNTER — Other Ambulatory Visit: Payer: Medicare PPO

## 2015-02-09 ENCOUNTER — Ambulatory Visit: Payer: Medicare PPO | Admitting: Hematology & Oncology

## 2015-02-20 ENCOUNTER — Other Ambulatory Visit (HOSPITAL_BASED_OUTPATIENT_CLINIC_OR_DEPARTMENT_OTHER): Payer: Medicare PPO

## 2015-02-20 ENCOUNTER — Other Ambulatory Visit: Payer: Self-pay | Admitting: Hematology & Oncology

## 2015-02-20 ENCOUNTER — Ambulatory Visit (HOSPITAL_BASED_OUTPATIENT_CLINIC_OR_DEPARTMENT_OTHER): Payer: Medicare PPO

## 2015-02-20 VITALS — BP 112/65 | HR 87 | Temp 97.1°F | Resp 20

## 2015-02-20 DIAGNOSIS — C7951 Secondary malignant neoplasm of bone: Secondary | ICD-10-CM

## 2015-02-20 DIAGNOSIS — C649 Malignant neoplasm of unspecified kidney, except renal pelvis: Secondary | ICD-10-CM | POA: Diagnosis not present

## 2015-02-20 DIAGNOSIS — Z5112 Encounter for antineoplastic immunotherapy: Secondary | ICD-10-CM

## 2015-02-20 LAB — CBC WITH DIFFERENTIAL (CANCER CENTER ONLY)
BASO#: 0 10*3/uL (ref 0.0–0.2)
BASO%: 0.7 % (ref 0.0–2.0)
EOS%: 4.4 % (ref 0.0–7.0)
Eosinophils Absolute: 0.2 10*3/uL (ref 0.0–0.5)
HCT: 38.2 % (ref 34.8–46.6)
HEMOGLOBIN: 12.1 g/dL (ref 11.6–15.9)
LYMPH#: 1 10*3/uL (ref 0.9–3.3)
LYMPH%: 21.7 % (ref 14.0–48.0)
MCH: 28.4 pg (ref 26.0–34.0)
MCHC: 31.7 g/dL — ABNORMAL LOW (ref 32.0–36.0)
MCV: 90 fL (ref 81–101)
MONO#: 0.5 10*3/uL (ref 0.1–0.9)
MONO%: 10.3 % (ref 0.0–13.0)
NEUT%: 62.9 % (ref 39.6–80.0)
NEUTROS ABS: 2.9 10*3/uL (ref 1.5–6.5)
PLATELETS: 213 10*3/uL (ref 145–400)
RBC: 4.26 10*6/uL (ref 3.70–5.32)
RDW: 16.8 % — ABNORMAL HIGH (ref 11.1–15.7)
WBC: 4.6 10*3/uL (ref 3.9–10.0)

## 2015-02-20 LAB — CMP (CANCER CENTER ONLY)
ALK PHOS: 55 U/L (ref 26–84)
ALT: 14 U/L (ref 10–47)
AST: 25 U/L (ref 11–38)
Albumin: 3.4 g/dL (ref 3.3–5.5)
BILIRUBIN TOTAL: 0.6 mg/dL (ref 0.20–1.60)
BUN, Bld: 21 mg/dL (ref 7–22)
CALCIUM: 9.2 mg/dL (ref 8.0–10.3)
CO2: 28 mEq/L (ref 18–33)
Chloride: 101 mEq/L (ref 98–108)
Creat: 1.2 mg/dl (ref 0.6–1.2)
GLUCOSE: 93 mg/dL (ref 73–118)
Potassium: 3.7 mEq/L (ref 3.3–4.7)
Sodium: 143 mEq/L (ref 128–145)
Total Protein: 7.1 g/dL (ref 6.4–8.1)

## 2015-02-20 LAB — LACTATE DEHYDROGENASE: LDH: 235 U/L (ref 125–245)

## 2015-02-20 MED ORDER — DENOSUMAB 120 MG/1.7ML ~~LOC~~ SOLN
120.0000 mg | Freq: Once | SUBCUTANEOUS | Status: AC
  Administered 2015-02-20: 120 mg via SUBCUTANEOUS
  Filled 2015-02-20: qty 1.7

## 2015-02-20 MED ORDER — SODIUM CHLORIDE 0.9 % IV SOLN
240.0000 mg | Freq: Once | INTRAVENOUS | Status: AC
  Administered 2015-02-20: 240 mg via INTRAVENOUS
  Filled 2015-02-20: qty 24

## 2015-02-20 MED ORDER — TRAMADOL HCL 50 MG PO TABS: 50.0000 mg | ORAL_TABLET | Freq: Four times a day (QID) | ORAL | Status: DC | PRN

## 2015-02-20 MED ORDER — SODIUM CHLORIDE 0.9 % IV SOLN
Freq: Once | INTRAVENOUS | Status: AC
  Administered 2015-02-20: 10:00:00 via INTRAVENOUS

## 2015-02-20 NOTE — Patient Instructions (Addendum)
Nivolumab injection What is this medicine? NIVOLUMAB (nye VOL ue mab) is a monoclonal antibody. It is used to treat melanoma, lung cancer, kidney cancer, and Hodgkin lymphoma. This medicine may be used for other purposes; ask your health care provider or pharmacist if you have questions. What should I tell my health care provider before I take this medicine? They need to know if you have any of these conditions: -diabetes -immune system problems -kidney disease -liver disease -lung disease -organ transplant -stomach or intestine problems -thyroid disease -an unusual or allergic reaction to nivolumab, other medicines, foods, dyes, or preservatives -pregnant or trying to get pregnant -breast-feeding How should I use this medicine? This medicine is for infusion into a vein. It is given by a health care professional in a hospital or clinic setting. A special MedGuide will be given to you before each treatment. Be sure to read this information carefully each time. Talk to your pediatrician regarding the use of this medicine in children. Special care may be needed. Overdosage: If you think you have taken too much of this medicine contact a poison control center or emergency room at once. NOTE: This medicine is only for you. Do not share this medicine with others. What if I miss a dose? It is important not to miss your dose. Call your doctor or health care professional if you are unable to keep an appointment. What may interact with this medicine? Interactions have not been studied. Give your health care provider a list of all the medicines, herbs, non-prescription drugs, or dietary supplements you use. Also tell them if you smoke, drink alcohol, or use illegal drugs. Some items may interact with your medicine. This list may not describe all possible interactions. Give your health care provider a list of all the medicines, herbs, non-prescription drugs, or dietary supplements you use. Also tell  them if you smoke, drink alcohol, or use illegal drugs. Some items may interact with your medicine. What should I watch for while using this medicine? This drug may make you feel generally unwell. Continue your course of treatment even though you feel ill unless your doctor tells you to stop. You may need blood work done while you are taking this medicine. Do not become pregnant while taking this medicine or for 5 months after stopping it. Women should inform their doctor if they wish to become pregnant or think they might be pregnant. There is a potential for serious side effects to an unborn child. Talk to your health care professional or pharmacist for more information. Do not breast-feed an infant while taking this medicine. What side effects may I notice from receiving this medicine? Side effects that you should report to your doctor or health care professional as soon as possible: -allergic reactions like skin rash, itching or hives, swelling of the face, lips, or tongue -black, tarry stools -blood in the urine -bloody or watery diarrhea -changes in vision -change in sex drive -changes in emotions or moods -chest pain -confusion -cough -decreased appetite -diarrhea -facial flushing -feeling faint or lightheaded -fever, chills -hair loss -hallucination, loss of contact with reality -headache -irritable -joint pain -loss of memory -muscle pain -muscle weakness -seizures -shortness of breath -signs and symptoms of high blood sugar such as dizziness; dry mouth; dry skin; fruity breath; nausea; stomach pain; increased hunger or thirst; increased urination -signs and symptoms of kidney injury like trouble passing urine or change in the amount of urine -signs and symptoms of liver injury like dark yellow or   brown urine; general ill feeling or flu-like symptoms; light-colored stools; loss of appetite; nausea; right upper belly pain; unusually weak or tired; yellowing of the eyes or  skin -stiff neck -swelling of the ankles, feet, hands -weight gain Side effects that usually do not require medical attention (report to your doctor or health care professional if they continue or are bothersome): -bone pain -constipation -tiredness -vomiting This list may not describe all possible side effects. Call your doctor for medical advice about side effects. You may report side effects to FDA at 1-800-FDA-1088. Where should I keep my medicine? This drug is given in a hospital or clinic and will not be stored at home. NOTE: This sheet is a summary. It may not cover all possible information. If you have questions about this medicine, talk to your doctor, pharmacist, or health care provider.    2016, Elsevier/Gold Standard. (2014-07-12 10:03:42)  Denosumab injection What is this medicine? DENOSUMAB (den oh sue mab) slows bone breakdown. Prolia is used to treat osteoporosis in women after menopause and in men. Xgeva is used to prevent bone fractures and other bone problems caused by cancer bone metastases. Xgeva is also used to treat giant cell tumor of the bone. This medicine may be used for other purposes; ask your health care provider or pharmacist if you have questions. What should I tell my health care provider before I take this medicine? They need to know if you have any of these conditions: -dental disease -eczema -infection or history of infections -kidney disease or on dialysis -low blood calcium or vitamin D -malabsorption syndrome -scheduled to have surgery or tooth extraction -taking medicine that contains denosumab -thyroid or parathyroid disease -an unusual reaction to denosumab, other medicines, foods, dyes, or preservatives -pregnant or trying to get pregnant -breast-feeding How should I use this medicine? This medicine is for injection under the skin. It is given by a health care professional in a hospital or clinic setting. If you are getting Prolia, a  special MedGuide will be given to you by the pharmacist with each prescription and refill. Be sure to read this information carefully each time. For Prolia, talk to your pediatrician regarding the use of this medicine in children. Special care may be needed. For Xgeva, talk to your pediatrician regarding the use of this medicine in children. While this drug may be prescribed for children as young as 13 years for selected conditions, precautions do apply. Overdosage: If you think you have taken too much of this medicine contact a poison control center or emergency room at once. NOTE: This medicine is only for you. Do not share this medicine with others. What if I miss a dose? It is important not to miss your dose. Call your doctor or health care professional if you are unable to keep an appointment. What may interact with this medicine? Do not take this medicine with any of the following medications: -other medicines containing denosumab This medicine may also interact with the following medications: -medicines that suppress the immune system -medicines that treat cancer -steroid medicines like prednisone or cortisone This list may not describe all possible interactions. Give your health care provider a list of all the medicines, herbs, non-prescription drugs, or dietary supplements you use. Also tell them if you smoke, drink alcohol, or use illegal drugs. Some items may interact with your medicine. What should I watch for while using this medicine? Visit your doctor or health care professional for regular checks on your progress. Your doctor or   health care professional may order blood tests and other tests to see how you are doing. Call your doctor or health care professional if you get a cold or other infection while receiving this medicine. Do not treat yourself. This medicine may decrease your body's ability to fight infection. You should make sure you get enough calcium and vitamin D while you  are taking this medicine, unless your doctor tells you not to. Discuss the foods you eat and the vitamins you take with your health care professional. See your dentist regularly. Brush and floss your teeth as directed. Before you have any dental work done, tell your dentist you are receiving this medicine. Do not become pregnant while taking this medicine or for 5 months after stopping it. Women should inform their doctor if they wish to become pregnant or think they might be pregnant. There is a potential for serious side effects to an unborn child. Talk to your health care professional or pharmacist for more information. What side effects may I notice from receiving this medicine? Side effects that you should report to your doctor or health care professional as soon as possible: -allergic reactions like skin rash, itching or hives, swelling of the face, lips, or tongue -breathing problems -chest pain -fast, irregular heartbeat -feeling faint or lightheaded, falls -fever, chills, or any other sign of infection -muscle spasms, tightening, or twitches -numbness or tingling -skin blisters or bumps, or is dry, peels, or red -slow healing or unexplained pain in the mouth or jaw -unusual bleeding or bruising Side effects that usually do not require medical attention (Report these to your doctor or health care professional if they continue or are bothersome.): -muscle pain -stomach upset, gas This list may not describe all possible side effects. Call your doctor for medical advice about side effects. You may report side effects to FDA at 1-800-FDA-1088. Where should I keep my medicine? This medicine is only given in a clinic, doctor's office, or other health care setting and will not be stored at home. NOTE: This sheet is a summary. It may not cover all possible information. If you have questions about this medicine, talk to your doctor, pharmacist, or health care provider.    2016, Elsevier/Gold  Standard. (2011-08-11 12:37:47)   

## 2015-02-20 NOTE — Progress Notes (Signed)
Pharmacy called to disregard Tramadol Rx due to allergy. [name] gave permission for pt to have injection at Dr. Nelva Bush' office, pt notified.

## 2015-03-06 ENCOUNTER — Other Ambulatory Visit (HOSPITAL_BASED_OUTPATIENT_CLINIC_OR_DEPARTMENT_OTHER): Payer: Medicare PPO

## 2015-03-06 ENCOUNTER — Ambulatory Visit (HOSPITAL_BASED_OUTPATIENT_CLINIC_OR_DEPARTMENT_OTHER): Payer: Medicare PPO

## 2015-03-06 ENCOUNTER — Ambulatory Visit (HOSPITAL_BASED_OUTPATIENT_CLINIC_OR_DEPARTMENT_OTHER): Payer: Medicare PPO | Admitting: Hematology & Oncology

## 2015-03-06 ENCOUNTER — Encounter: Payer: Self-pay | Admitting: Hematology & Oncology

## 2015-03-06 VITALS — BP 122/72 | HR 85 | Temp 97.9°F | Resp 16 | Ht 65.0 in | Wt 137.0 lb

## 2015-03-06 DIAGNOSIS — C7951 Secondary malignant neoplasm of bone: Principal | ICD-10-CM

## 2015-03-06 DIAGNOSIS — Z5112 Encounter for antineoplastic immunotherapy: Secondary | ICD-10-CM | POA: Diagnosis not present

## 2015-03-06 DIAGNOSIS — C649 Malignant neoplasm of unspecified kidney, except renal pelvis: Secondary | ICD-10-CM | POA: Diagnosis not present

## 2015-03-06 LAB — CBC WITH DIFFERENTIAL (CANCER CENTER ONLY)
BASO#: 0.1 10*3/uL (ref 0.0–0.2)
BASO%: 1 % (ref 0.0–2.0)
EOS%: 3.7 % (ref 0.0–7.0)
Eosinophils Absolute: 0.2 10*3/uL (ref 0.0–0.5)
HEMATOCRIT: 38.2 % (ref 34.8–46.6)
HGB: 12.2 g/dL (ref 11.6–15.9)
LYMPH#: 1.1 10*3/uL (ref 0.9–3.3)
LYMPH%: 21.9 % (ref 14.0–48.0)
MCH: 28.2 pg (ref 26.0–34.0)
MCHC: 31.9 g/dL — ABNORMAL LOW (ref 32.0–36.0)
MCV: 88 fL (ref 81–101)
MONO#: 0.5 10*3/uL (ref 0.1–0.9)
MONO%: 10.9 % (ref 0.0–13.0)
NEUT#: 3.1 10*3/uL (ref 1.5–6.5)
NEUT%: 62.5 % (ref 39.6–80.0)
PLATELETS: 235 10*3/uL (ref 145–400)
RBC: 4.33 10*6/uL (ref 3.70–5.32)
RDW: 17.1 % — AB (ref 11.1–15.7)
WBC: 4.9 10*3/uL (ref 3.9–10.0)

## 2015-03-06 LAB — CMP (CANCER CENTER ONLY)
ALK PHOS: 51 U/L (ref 26–84)
ALT: 15 U/L (ref 10–47)
AST: 21 U/L (ref 11–38)
Albumin: 3.7 g/dL (ref 3.3–5.5)
BUN: 21 mg/dL (ref 7–22)
CO2: 26 mEq/L (ref 18–33)
Calcium: 8.8 mg/dL (ref 8.0–10.3)
Chloride: 100 mEq/L (ref 98–108)
Creat: 1.3 mg/dl — ABNORMAL HIGH (ref 0.6–1.2)
GLUCOSE: 81 mg/dL (ref 73–118)
POTASSIUM: 3.5 meq/L (ref 3.3–4.7)
SODIUM: 142 meq/L (ref 128–145)
Total Bilirubin: 0.7 mg/dl (ref 0.20–1.60)
Total Protein: 7.2 g/dL (ref 6.4–8.1)

## 2015-03-06 LAB — LACTATE DEHYDROGENASE: LDH: 197 U/L (ref 125–245)

## 2015-03-06 MED ORDER — SODIUM CHLORIDE 0.9 % IV SOLN
Freq: Once | INTRAVENOUS | Status: AC
  Administered 2015-03-06: 11:00:00 via INTRAVENOUS

## 2015-03-06 MED ORDER — SODIUM CHLORIDE 0.9 % IV SOLN
240.0000 mg | Freq: Once | INTRAVENOUS | Status: AC
  Administered 2015-03-06: 240 mg via INTRAVENOUS
  Filled 2015-03-06: qty 20

## 2015-03-06 NOTE — Progress Notes (Signed)
Hematology and Oncology Follow Up Visit  Cathy Lewis [id number] 1945-10-26 70 y.o. 03/06/2015   Principle Diagnosis:   Metastatic renal cell carcinoma  Current Therapy:    Status post 6 cycles of Nivolumab  Palliative radiation therapy to her back     Interim History:  Cathy Lewis is back for follow-up. She's not feeling all that well. She is taking radiation therapy right now. She is almost done. She's having issues with her left shoulder. It sounds like this might be rotator cuff problem. She does not want an MRI. She does not want to be referred to with [facility] surgery.  She is also having problems with a right inguinal hernia. She has had a left inguinal hernia repaired in the past. His elevation probably will need to have this taken care of. I told her that with her treatment protocol, the relation of the problems with her having surgery. Gene which she wants.  She's had a little bit of a cough. This is been nonproductive. She has no shortness of breath.  She's had no diarrhea or constipation. She's had no leg swelling. She's had no rashes also that on her chest. She says Vaseline seems to work best.  Overall, I was stated at her performance status is probably ECOG 1. .  Medications:  Current outpatient prescriptions:  .  acetaminophen (TYLENOL) 500 MG tablet, Take 1,000 mg by mouth every 6 (six) hours as needed. Pain , Disp: , Rfl:  .  albuterol (PROVENTIL,VENTOLIN) 90 MCG/ACT inhaler, Inhale 2 puffs into the lungs every 4 (four) hours as needed. Wheezing , Disp: , Rfl:  .  amLODipine (NORVASC) 5 MG tablet, Take 5 mg by mouth daily., Disp: , Rfl:  .  Ascorbic Acid (VITAMIN C) 1000 MG tablet, Take 1,000 mg by mouth daily. , Disp: , Rfl:  .  Calcium Carbonate-Vitamin D (CALCIUM + D PO), Take 1 tablet by mouth 2 (two) times daily. , Disp: , Rfl:  .  fish oil-omega-3 fatty acids 1000 MG capsule, Take 1 g by mouth 2 (two) times daily. , Disp: , Rfl:  .  fluticasone (FLONASE) 50  MCG/ACT nasal spray, Place 1 spray into the nose 2 (two) times daily as needed. Allergies , Disp: , Rfl:  .  Fluticasone-Salmeterol (ADVAIR DISKUS) 100-50 MCG/DOSE AEPB, Inhale 1 puff into the lungs 2 (two) times daily. , Disp: , Rfl:  .  hydrochlorothiazide (HYDRODIURIL) 25 MG tablet, Take 25 mg by mouth daily., Disp: , Rfl:  .  HYDROcodone-acetaminophen (NORCO/VICODIN) 5-325 MG tablet, Take 5-325 tablets by mouth every 6 (six) hours as needed., Disp: , Rfl:  .  magnesium gluconate (MAGONATE) 500 MG tablet, Take 500 mg by mouth daily., Disp: , Rfl:  .  meclizine (ANTIVERT) 25 MG tablet, Take 12.5 mg by mouth 3 (three) times daily as needed. For dizziness., Disp: , Rfl:  .  montelukast (SINGULAIR) 10 MG tablet, Take 10 mg by mouth at bedtime. , Disp: , Rfl:  .  ondansetron (ZOFRAN) 4 MG tablet, Take 1 tablet (4 mg total) by mouth every 6 (six) hours as needed for nausea or vomiting., Disp: 20 tablet, Rfl: 0 .  oxyCODONE (OXY IR/ROXICODONE) 5 MG immediate release tablet, Take 1 tablet (5 mg total) by mouth every 4 (four) hours as needed for moderate pain, severe pain or breakthrough pain., Disp: 30 tablet, Rfl: 0 .  predniSONE (DELTASONE) 20 MG tablet, , Disp: , Rfl:  .  rosuvastatin (CRESTOR) 10 MG tablet, Take 10 mg  by mouth at bedtime. , Disp: , Rfl:  .  vitamin B-12 (CYANOCOBALAMIN) 1000 MCG tablet, Take 1,000 mcg by mouth daily. , Disp: , Rfl:  .  [DISCONTINUED] enoxaparin (LOVENOX) 30 MG/0.3ML SOLN, Inject 0.3 mLs (30 mg total) into the skin every 12 (twelve) hours., Disp: 28 Syringe, Rfl: 0 .  [DISCONTINUED] famotidine (PEPCID) 20 MG tablet, Take 20 mg by mouth 2 (two) times daily as needed. Heart burn , Disp: , Rfl:  .  [DISCONTINUED] ferrous sulfate 325 (65 FE) MG tablet, Take 1 tablet (325 mg total) by mouth 3 (three) times daily after meals., Disp: 90 tablet, Rfl: 0  Allergies:  Allergies  Allergen Reactions  . Azithromycin   . Gnp [Acetaminophen]     "GNR" (cough syrup)   . Tramadol  Nausea And Vomiting and Other (See Comments)    dizziness   . Avelox [Moxifloxacin Hcl In Nacl] Palpitations and Other (See Comments)    Body ache   . Etodolac Palpitations and Other (See Comments)    Joints hurt    Past Medical History, Surgical history, Social history, and Family History were reviewed and updated.  Review of Systems: As above  Physical Exam:  height is 5\' 5"  (1.651 m) and weight is 137 lb (62.143 kg). Her oral temperature is 97.9 F (36.6 C). Her blood pressure is 122/72 and her pulse is 85. Her respiration is 16.   Wt Readings from Last 3 Encounters:  03/06/15 137 lb (62.143 kg)  02/06/15 138 lb (62.596 kg)  01/08/15 139 lb (63.05 kg)     Well-developed and well-nourished white female in no obvious distress. Head and neck exam shows no ocular or oral lesions. There are no palpable cervical or supraclavicular lymph nodes. Lungs are clear. Cardiac exam regular rate and rhythm with no murmurs, rubs or bruits. Abdomen is soft. She has good bowel sounds. There is no fluid wave.  she had does have a palpable right inguinal hernia. This appears be a direct inguinal hernia. It is somewhat tender. It is reducible. There is no palpable hepatosplenomegaly. Back exam shows no obvious tenderness over the spine. She has no muscle spasms in his back. Extremity shows no clubbing, cyanosis or edema. she has limited range of motion of the left shoulder. There is some tenderness to palpation over the superior aspect of the left shoulder.  Neurological exam shows no focal neurological deficits. Skin exam shows no rashes, ecchymoses or petechia.  Lab Results  Component Value Date   WBC 4.9 03/06/2015   HGB 12.2 03/06/2015   HCT 38.2 03/06/2015   MCV 88 03/06/2015   PLT 235 03/06/2015     Chemistry      Component Value Date/Time   NA 143 02/20/2015 0923   NA 133* 12/01/2014 2017   K 3.7 02/20/2015 0923   K 3.7 12/01/2014 2017   CL 101 02/20/2015 0923   CL 101 12/01/2014 2017    CO2 28 02/20/2015 0923   CO2 26 12/01/2014 2017   BUN 21 02/20/2015 0923   BUN 21* 12/01/2014 2017   CREATININE 1.2 02/20/2015 0923   CREATININE 1.44* 12/01/2014 2017   GLU 104 07/20/2008      Component Value Date/Time   CALCIUM 9.2 02/20/2015 0923   CALCIUM 9.1 12/01/2014 2017   ALKPHOS 55 02/20/2015 0923   ALKPHOS 61 12/01/2014 2017   AST 25 02/20/2015 0923   AST 20 12/01/2014 2017   ALT 14 02/20/2015 0923   ALT 13* 12/01/2014 2017  BILITOT 0.60 02/20/2015 [id number]   BILITOT 0.7 12/01/2014 2017         Impression and Plan: Cathy Lewis is a 70 year old white female with metastatic renal cell carcinoma. This is clear-cell carcinoma. She was diagnosed back in 2013.   This will be seventhcycle of therapy. After her eighth cycle, I will go ahead and rescan her. I will set this up in [facility].  I will speak with her surgeon. This is Dr. Zella Richer. Establish she may need to have the hernia repaired. Again, I do not see any problems with this.  The left shoulder I think was going to be a problem for her. Ultimately, she probably will be seen and with the surgeon for this. I will see her back in 2 weeks for her eighth cycle of treatment.  I spent about 30 minutes with her today.   Volanda Napoleon, MD 1/10/201710:07 AM

## 2015-03-06 NOTE — Patient Instructions (Signed)
Nivolumab injection What is this medicine? NIVOLUMAB (nye VOL ue mab) is a monoclonal antibody. It is used to treat melanoma, lung cancer, kidney cancer, and Hodgkin lymphoma. This medicine may be used for other purposes; ask your health care provider or pharmacist if you have questions. What should I tell my health care provider before I take this medicine? They need to know if you have any of these conditions: -diabetes -immune system problems -kidney disease -liver disease -lung disease -organ transplant -stomach or intestine problems -thyroid disease -an unusual or allergic reaction to nivolumab, other medicines, foods, dyes, or preservatives -pregnant or trying to get pregnant -breast-feeding How should I use this medicine? This medicine is for infusion into a vein. It is given by a health care professional in a hospital or clinic setting. A special MedGuide will be given to you before each treatment. Be sure to read this information carefully each time. Talk to your pediatrician regarding the use of this medicine in children. Special care may be needed. Overdosage: If you think you have taken too much of this medicine contact a poison control center or emergency room at once. NOTE: This medicine is only for you. Do not share this medicine with others. What if I miss a dose? It is important not to miss your dose. Call your doctor or health care professional if you are unable to keep an appointment. What may interact with this medicine? Interactions have not been studied. Give your health care provider a list of all the medicines, herbs, non-prescription drugs, or dietary supplements you use. Also tell them if you smoke, drink alcohol, or use illegal drugs. Some items may interact with your medicine. This list may not describe all possible interactions. Give your health care provider a list of all the medicines, herbs, non-prescription drugs, or dietary supplements you use. Also tell  them if you smoke, drink alcohol, or use illegal drugs. Some items may interact with your medicine. What should I watch for while using this medicine? This drug may make you feel generally unwell. Continue your course of treatment even though you feel ill unless your doctor tells you to stop. You may need blood work done while you are taking this medicine. Do not become pregnant while taking this medicine or for 5 months after stopping it. Women should inform their doctor if they wish to become pregnant or think they might be pregnant. There is a potential for serious side effects to an unborn child. Talk to your health care professional or pharmacist for more information. Do not breast-feed an infant while taking this medicine. What side effects may I notice from receiving this medicine? Side effects that you should report to your doctor or health care professional as soon as possible: -allergic reactions like skin rash, itching or hives, swelling of the face, lips, or tongue -black, tarry stools -blood in the urine -bloody or watery diarrhea -changes in vision -change in sex drive -changes in emotions or moods -chest pain -confusion -cough -decreased appetite -diarrhea -facial flushing -feeling faint or lightheaded -fever, chills -hair loss -hallucination, loss of contact with reality -headache -irritable -joint pain -loss of memory -muscle pain -muscle weakness -seizures -shortness of breath -signs and symptoms of high blood sugar such as dizziness; dry mouth; dry skin; fruity breath; nausea; stomach pain; increased hunger or thirst; increased urination -signs and symptoms of kidney injury like trouble passing urine or change in the amount of urine -signs and symptoms of liver injury like dark yellow or   brown urine; general ill feeling or flu-like symptoms; light-colored stools; loss of appetite; nausea; right upper belly pain; unusually weak or tired; yellowing of the eyes or  skin -stiff neck -swelling of the ankles, feet, hands -weight gain Side effects that usually do not require medical attention (report to your doctor or health care professional if they continue or are bothersome): -bone pain -constipation -tiredness -vomiting This list may not describe all possible side effects. Call your doctor for medical advice about side effects. You may report side effects to FDA at 1-800-FDA-1088. Where should I keep my medicine? This drug is given in a hospital or clinic and will not be stored at home. NOTE: This sheet is a summary. It may not cover all possible information. If you have questions about this medicine, talk to your doctor, pharmacist, or health care provider.    2016, [employer]vier/Gold Standard. (2014-07-12 10:03:42)  

## 2015-03-13 ENCOUNTER — Other Ambulatory Visit: Payer: Self-pay

## 2015-03-20 ENCOUNTER — Other Ambulatory Visit (HOSPITAL_BASED_OUTPATIENT_CLINIC_OR_DEPARTMENT_OTHER): Payer: Medicare PPO

## 2015-03-20 ENCOUNTER — Ambulatory Visit (HOSPITAL_BASED_OUTPATIENT_CLINIC_OR_DEPARTMENT_OTHER): Payer: Medicare PPO

## 2015-03-20 ENCOUNTER — Ambulatory Visit (HOSPITAL_BASED_OUTPATIENT_CLINIC_OR_DEPARTMENT_OTHER): Payer: Medicare PPO | Admitting: Hematology & Oncology

## 2015-03-20 VITALS — BP 128/59 | HR 70 | Temp 98.0°F | Resp 16 | Wt 137.0 lb

## 2015-03-20 DIAGNOSIS — Z5112 Encounter for antineoplastic immunotherapy: Secondary | ICD-10-CM

## 2015-03-20 DIAGNOSIS — J40 Bronchitis, not specified as acute or chronic: Secondary | ICD-10-CM | POA: Diagnosis not present

## 2015-03-20 DIAGNOSIS — C7951 Secondary malignant neoplasm of bone: Secondary | ICD-10-CM

## 2015-03-20 DIAGNOSIS — C649 Malignant neoplasm of unspecified kidney, except renal pelvis: Secondary | ICD-10-CM | POA: Diagnosis not present

## 2015-03-20 LAB — CBC WITH DIFFERENTIAL (CANCER CENTER ONLY)
BASO#: 0 10*3/uL (ref 0.0–0.2)
BASO%: 0.6 % (ref 0.0–2.0)
EOS%: 1.7 % (ref 0.0–7.0)
Eosinophils Absolute: 0.1 10*3/uL (ref 0.0–0.5)
HCT: 38.2 % (ref 34.8–46.6)
HGB: 12.3 g/dL (ref 11.6–15.9)
LYMPH#: 1 10*3/uL (ref 0.9–3.3)
LYMPH%: 18.4 % (ref 14.0–48.0)
MCH: 28.1 pg (ref 26.0–34.0)
MCHC: 32.2 g/dL (ref 32.0–36.0)
MCV: 87 fL (ref 81–101)
MONO#: 0.6 10*3/uL (ref 0.1–0.9)
MONO%: 11.8 % (ref 0.0–13.0)
NEUT#: 3.7 10*3/uL (ref 1.5–6.5)
NEUT%: 67.5 % (ref 39.6–80.0)
PLATELETS: 236 10*3/uL (ref 145–400)
RBC: 4.37 10*6/uL (ref 3.70–5.32)
RDW: 17.4 % — ABNORMAL HIGH (ref 11.1–15.7)
WBC: 5.4 10*3/uL (ref 3.9–10.0)

## 2015-03-20 LAB — CMP (CANCER CENTER ONLY)
ALK PHOS: 45 U/L (ref 26–84)
ALT: 16 U/L (ref 10–47)
AST: 22 U/L (ref 11–38)
Albumin: 3.6 g/dL (ref 3.3–5.5)
BILIRUBIN TOTAL: 0.6 mg/dL (ref 0.20–1.60)
BUN, Bld: 24 mg/dL — ABNORMAL HIGH (ref 7–22)
CALCIUM: 9.6 mg/dL (ref 8.0–10.3)
CO2: 27 mEq/L (ref 18–33)
Chloride: 103 mEq/L (ref 98–108)
Creat: 1.3 mg/dl — ABNORMAL HIGH (ref 0.6–1.2)
GLUCOSE: 84 mg/dL (ref 73–118)
Potassium: 4 mEq/L (ref 3.3–4.7)
Sodium: 140 mEq/L (ref 128–145)
TOTAL PROTEIN: 7.2 g/dL (ref 6.4–8.1)

## 2015-03-20 LAB — LACTATE DEHYDROGENASE: LDH: 177 U/L (ref 125–245)

## 2015-03-20 MED ORDER — SODIUM CHLORIDE 0.9 % IV SOLN
Freq: Once | INTRAVENOUS | Status: AC
  Administered 2015-03-20: 10:00:00 via INTRAVENOUS

## 2015-03-20 MED ORDER — NIVOLUMAB CHEMO INJECTION 100 MG/10ML
240.0000 mg | Freq: Once | INTRAVENOUS | Status: AC
  Administered 2015-03-20: 240 mg via INTRAVENOUS
  Filled 2015-03-20: qty 24

## 2015-03-20 MED ORDER — DEXTROSE 5 % IV SOLN
2.0000 g | Freq: Once | INTRAVENOUS | Status: AC
  Administered 2015-03-20: 2 g via INTRAVENOUS
  Filled 2015-03-20: qty 2

## 2015-03-20 MED ORDER — DENOSUMAB 120 MG/1.7ML ~~LOC~~ SOLN
120.0000 mg | Freq: Once | SUBCUTANEOUS | Status: AC
  Administered 2015-03-20: 120 mg via SUBCUTANEOUS
  Filled 2015-03-20: qty 1.7

## 2015-03-20 MED ORDER — CEFDINIR 300 MG PO CAPS: 300.0000 mg | ORAL_CAPSULE | Freq: Two times a day (BID) | ORAL | Status: DC

## 2015-03-20 NOTE — Patient Instructions (Signed)
Nivolumab injection What is this medicine? NIVOLUMAB (nye VOL ue mab) is a monoclonal antibody. It is used to treat melanoma, lung cancer, kidney cancer, and Hodgkin lymphoma. This medicine may be used for other purposes; ask your health care provider or pharmacist if you have questions. What should I tell my health care provider before I take this medicine? They need to know if you have any of these conditions: -diabetes -immune system problems -kidney disease -liver disease -lung disease -organ transplant -stomach or intestine problems -thyroid disease -an unusual or allergic reaction to nivolumab, other medicines, foods, dyes, or preservatives -pregnant or trying to get pregnant -breast-feeding How should I use this medicine? This medicine is for infusion into a vein. It is given by a health care professional in a hospital or clinic setting. A special MedGuide will be given to you before each treatment. Be sure to read this information carefully each time. Talk to your pediatrician regarding the use of this medicine in children. Special care may be needed. Overdosage: If you think you have taken too much of this medicine contact a poison control center or emergency room at once. NOTE: This medicine is only for you. Do not share this medicine with others. What if I miss a dose? It is important not to miss your dose. Call your doctor or health care professional if you are unable to keep an appointment. What may interact with this medicine? Interactions have not been studied. Give your health care provider a list of all the medicines, herbs, non-prescription drugs, or dietary supplements you use. Also tell them if you smoke, drink alcohol, or use illegal drugs. Some items may interact with your medicine. This list may not describe all possible interactions. Give your health care provider a list of all the medicines, herbs, non-prescription drugs, or dietary supplements you use. Also tell  them if you smoke, drink alcohol, or use illegal drugs. Some items may interact with your medicine. What should I watch for while using this medicine? This drug may make you feel generally unwell. Continue your course of treatment even though you feel ill unless your doctor tells you to stop. You may need blood work done while you are taking this medicine. Do not become pregnant while taking this medicine or for 5 months after stopping it. Women should inform their doctor if they wish to become pregnant or think they might be pregnant. There is a potential for serious side effects to an unborn child. Talk to your health care professional or pharmacist for more information. Do not breast-feed an infant while taking this medicine. What side effects may I notice from receiving this medicine? Side effects that you should report to your doctor or health care professional as soon as possible: -allergic reactions like skin rash, itching or hives, swelling of the face, lips, or tongue -black, tarry stools -blood in the urine -bloody or watery diarrhea -changes in vision -change in sex drive -changes in emotions or moods -chest pain -confusion -cough -decreased appetite -diarrhea -facial flushing -feeling faint or lightheaded -fever, chills -hair loss -hallucination, loss of contact with reality -headache -irritable -joint pain -loss of memory -muscle pain -muscle weakness -seizures -shortness of breath -signs and symptoms of high blood sugar such as dizziness; dry mouth; dry skin; fruity breath; nausea; stomach pain; increased hunger or thirst; increased urination -signs and symptoms of kidney injury like trouble passing urine or change in the amount of urine -signs and symptoms of liver injury like dark yellow or  brown urine; general ill feeling or flu-like symptoms; light-colored stools; loss of appetite; nausea; right upper belly pain; unusually weak or tired; yellowing of the eyes or  skin -stiff neck -swelling of the ankles, feet, hands -weight gain Side effects that usually do not require medical attention (report to your doctor or health care professional if they continue or are bothersome): -bone pain -constipation -tiredness -vomiting This list may not describe all possible side effects. Call your doctor for medical advice about side effects. You may report side effects to FDA at 1-800-FDA-1088. Where should I keep my medicine? This drug is given in a hospital or clinic and will not be stored at home. NOTE: This sheet is a summary. It may not cover all possible information. If you have questions about this medicine, talk to your doctor, pharmacist, or health care provider.    2016, Elsevier/Gold Standard. (2014-07-12 10:03:42)   Denosumab injection What is this medicine? DENOSUMAB (den oh sue mab) slows bone breakdown. Prolia is used to treat osteoporosis in women after menopause and in men. Delton See is used to prevent bone fractures and other bone problems caused by cancer bone metastases. Delton See is also used to treat giant cell tumor of the bone. This medicine may be used for other purposes; ask your health care provider or pharmacist if you have questions. What should I tell my health care provider before I take this medicine? They need to know if you have any of these conditions: -dental disease -eczema -infection or history of infections -kidney disease or on dialysis -low blood calcium or vitamin D -malabsorption syndrome -scheduled to have surgery or tooth extraction -taking medicine that contains denosumab -thyroid or parathyroid disease -an unusual reaction to denosumab, other medicines, foods, dyes, or preservatives -pregnant or trying to get pregnant -breast-feeding How should I use this medicine? This medicine is for injection under the skin. It is given by a health care professional in a hospital or clinic setting. If you are getting Prolia, a  special MedGuide will be given to you by the pharmacist with each prescription and refill. Be sure to read this information carefully each time. For Prolia, talk to your pediatrician regarding the use of this medicine in children. Special care may be needed. For Delton See, talk to your pediatrician regarding the use of this medicine in children. While this drug may be prescribed for children as young as 13 years for selected conditions, precautions do apply. Overdosage: If you think you have taken too much of this medicine contact a poison control center or emergency room at once. NOTE: This medicine is only for you. Do not share this medicine with others. What if I miss a dose? It is important not to miss your dose. Call your doctor or health care professional if you are unable to keep an appointment. What may interact with this medicine? Do not take this medicine with any of the following medications: -other medicines containing denosumab This medicine may also interact with the following medications: -medicines that suppress the immune system -medicines that treat cancer -steroid medicines like prednisone or cortisone This list may not describe all possible interactions. Give your health care provider a list of all the medicines, herbs, non-prescription drugs, or dietary supplements you use. Also tell them if you smoke, drink alcohol, or use illegal drugs. Some items may interact with your medicine. What should I watch for while using this medicine? Visit your doctor or health care professional for regular checks on your progress. Your doctor  or health care professional may order blood tests and other tests to see how you are doing. Call your doctor or health care professional if you get a cold or other infection while receiving this medicine. Do not treat yourself. This medicine may decrease your body's ability to fight infection. You should make sure you get enough calcium and vitamin D while you  are taking this medicine, unless your doctor tells you not to. Discuss the foods you eat and the vitamins you take with your health care professional. See your dentist regularly. Brush and floss your teeth as directed. Before you have any dental work done, tell your dentist you are receiving this medicine. Do not become pregnant while taking this medicine or for 5 months after stopping it. Women should inform their doctor if they wish to become pregnant or think they might be pregnant. There is a potential for serious side effects to an unborn child. Talk to your health care professional or pharmacist for more information. What side effects may I notice from receiving this medicine? Side effects that you should report to your doctor or health care professional as soon as possible: -allergic reactions like skin rash, itching or hives, swelling of the face, lips, or tongue -breathing problems -chest pain -fast, irregular heartbeat -feeling faint or lightheaded, falls -fever, chills, or any other sign of infection -muscle spasms, tightening, or twitches -numbness or tingling -skin blisters or bumps, or is dry, peels, or red -slow healing or unexplained pain in the mouth or jaw -unusual bleeding or bruising Side effects that usually do not require medical attention (Report these to your doctor or health care professional if they continue or are bothersome.): -muscle pain -stomach upset, gas This list may not describe all possible side effects. Call your doctor for medical advice about side effects. You may report side effects to FDA at 1-800-FDA-1088. Where should I keep my medicine? This medicine is only given in a clinic, doctor's office, or other health care setting and will not be stored at home. NOTE: This sheet is a summary. It may not cover all possible information. If you have questions about this medicine, talk to your doctor, pharmacist, or health care provider.    2016, Elsevier/Gold  Standard. (2011-08-11 12:37:47)   Ceftriaxone injection What is this medicine? CEFTRIAXONE (sef try AX one) is a cephalosporin antibiotic. It is used to treat certain kinds of bacterial infections. It will not work for colds, flu, or other viral infections. This medicine may be used for other purposes; ask your health care provider or pharmacist if you have questions. What should I tell my health care provider before I take this medicine? They need to know if you have any of these conditions: -any chronic illness -bowel disease, like colitis -both kidney and liver disease -high bilirubin level in newborn patients -an unusual or allergic reaction to ceftriaxone, other cephalosporin or penicillin antibiotics, foods, dyes, or preservatives -pregnant or trying to get pregnant -breast-feeding How should I use this medicine? This medicine is injected into a muscle or infused it into a vein. It is usually given in a medical office or clinic. If you are to give this medicine you will be taught how to inject it. Follow instructions carefully. Use your doses at regular intervals. Do not take your medicine more often than directed. Do not skip doses or stop your medicine early even if you feel better. Do not stop taking except on your doctor's advice. Talk to your pediatrician regarding the  use of this medicine in children. Special care may be needed. Overdosage: If you think you have taken too much of this medicine contact a poison control center or emergency room at once. NOTE: This medicine is only for you. Do not share this medicine with others. What if I miss a dose? If you miss a dose, take it as soon as you can. If it is almost time for your next dose, take only that dose. Do not take double or extra doses. What may interact with this medicine? Do not take this medicine with any of the following medications: -intravenous calcium This medicine may also interact with the following  medications: -birth control pills This list may not describe all possible interactions. Give your health care provider a list of all the medicines, herbs, non-prescription drugs, or dietary supplements you use. Also tell them if you smoke, drink alcohol, or use illegal drugs. Some items may interact with your medicine. What should I watch for while using this medicine? Tell your doctor or health care professional if your symptoms do not improve or if they get worse. Do not treat diarrhea with over the counter products. Contact your doctor if you have diarrhea that lasts more than 2 days or if it is severe and watery. If you are being treated for a sexually transmitted disease, avoid sexual contact until you have finished your treatment. Having sex can infect your sexual partner. Calcium may bind to this medicine and cause lung or kidney problems. Avoid calcium products while taking this medicine and for 48 hours after taking the last dose of this medicine. What side effects may I notice from receiving this medicine? Side effects that you should report to your doctor or health care professional as soon as possible: -allergic reactions like skin rash, itching or hives, swelling of the face, lips, or tongue -breathing problems -fever, chills -irregular heartbeat -pain when passing urine -seizures -stomach pain, cramps -unusual bleeding, bruising -unusually weak or tired Side effects that usually do not require medical attention (report to your doctor or health care professional if they continue or are bothersome): -diarrhea -dizzy, drowsy -headache -nausea, vomiting -pain, swelling, irritation where injected -stomach upset -sweating This list may not describe all possible side effects. Call your doctor for medical advice about side effects. You may report side effects to FDA at 1-800-FDA-1088. Where should I keep my medicine? Keep out of the reach of children. Store at room temperature  below 25 degrees C (77 degrees F). Protect from light. Throw away any unused vials after the expiration date. NOTE: This sheet is a summary. It may not cover all possible information. If you have questions about this medicine, talk to your doctor, pharmacist, or health care provider.    2016, Elsevier/Gold Standard. (2013-08-29 09:14:54)

## 2015-03-21 NOTE — Progress Notes (Signed)
Hematology and Oncology Follow Up Visit  Cathy Lewis [id number] 1945/09/12 70 y.o. 03/21/2015   Principle Diagnosis:   Metastatic renal cell carcinoma  Current Therapy:    Status post 7 cycles of Nivolumab  Palliative radiation therapy to her back     Interim History:  Cathy Lewis is back for follow-up. She is feeling better. She completed radiation therapy for her shoulder. This helped her.  She is eating okay. She has had no problems with nausea or vomiting. She has had no off or shortness of breath. She has had no leg swelling. There has been no rashes  and she has had no headache.  There's been no weight loss or weight gain. She has had no change in medications.  Overall, her performance status is probably ECOG 1. .  Medications:  Current outpatient prescriptions:  .  acetaminophen (TYLENOL) 500 MG tablet, Take 1,000 mg by mouth every 6 (six) hours as needed. Pain , Disp: , Rfl:  .  albuterol (PROVENTIL,VENTOLIN) 90 MCG/ACT inhaler, Inhale 2 puffs into the lungs every 4 (four) hours as needed. Wheezing , Disp: , Rfl:  .  amLODipine (NORVASC) 5 MG tablet, Take 5 mg by mouth daily., Disp: , Rfl:  .  Ascorbic Acid (VITAMIN C) 1000 MG tablet, Take 1,000 mg by mouth daily. , Disp: , Rfl:  .  Calcium Carbonate-Vitamin D (CALCIUM + D PO), Take 1 tablet by mouth 2 (two) times daily. , Disp: , Rfl:  .  fish oil-omega-3 fatty acids 1000 MG capsule, Take 1 g by mouth 2 (two) times daily. , Disp: , Rfl:  .  fluticasone (FLONASE) 50 MCG/ACT nasal spray, Place 1 spray into the nose 2 (two) times daily as needed. Allergies , Disp: , Rfl:  .  Fluticasone-Salmeterol (ADVAIR DISKUS) 100-50 MCG/DOSE AEPB, Inhale 1 puff into the lungs 2 (two) times daily. , Disp: , Rfl:  .  hydrochlorothiazide (HYDRODIURIL) 25 MG tablet, Take 25 mg by mouth daily., Disp: , Rfl:  .  HYDROcodone-acetaminophen (NORCO/VICODIN) 5-325 MG tablet, Take 5-325 tablets by mouth every 6 (six) hours as needed., Disp: ,  Rfl:  .  magnesium gluconate (MAGONATE) 500 MG tablet, Take 500 mg by mouth daily., Disp: , Rfl:  .  meclizine (ANTIVERT) 25 MG tablet, Take 12.5 mg by mouth 3 (three) times daily as needed. For dizziness., Disp: , Rfl:  .  montelukast (SINGULAIR) 10 MG tablet, Take 10 mg by mouth at bedtime. , Disp: , Rfl:  .  ondansetron (ZOFRAN) 4 MG tablet, Take 1 tablet (4 mg total) by mouth every 6 (six) hours as needed for nausea or vomiting., Disp: 20 tablet, Rfl: 0 .  oxyCODONE (OXY IR/ROXICODONE) 5 MG immediate release tablet, Take 1 tablet (5 mg total) by mouth every 4 (four) hours as needed for moderate pain, severe pain or breakthrough pain., Disp: 30 tablet, Rfl: 0 .  predniSONE (DELTASONE) 20 MG tablet, , Disp: , Rfl:  .  rosuvastatin (CRESTOR) 10 MG tablet, Take 10 mg by mouth at bedtime. , Disp: , Rfl:  .  vitamin B-12 (CYANOCOBALAMIN) 1000 MCG tablet, Take 1,000 mcg by mouth daily. , Disp: , Rfl:  .  cefdinir (OMNICEF) 300 MG capsule, Take 1 capsule (300 mg total) by mouth 2 (two) times daily., Disp: 14 capsule, Rfl: 0 .  [DISCONTINUED] enoxaparin (LOVENOX) 30 MG/0.3ML SOLN, Inject 0.3 mLs (30 mg total) into the skin every 12 (twelve) hours., Disp: 28 Syringe, Rfl: 0 .  [DISCONTINUED] famotidine (PEPCID) 20  MG tablet, Take 20 mg by mouth 2 (two) times daily as needed. Heart burn , Disp: , Rfl:  .  [DISCONTINUED] ferrous sulfate 325 (65 FE) MG tablet, Take 1 tablet (325 mg total) by mouth 3 (three) times daily after meals., Disp: 90 tablet, Rfl: 0  Allergies:  Allergies  Allergen Reactions  . Azithromycin   . Gnp [Acetaminophen]     "GNR" (cough syrup)   . Tramadol Nausea And Vomiting and Other (See Comments)    dizziness   . Avelox [Moxifloxacin Hcl In Nacl] Palpitations and Other (See Comments)    Body ache   . Etodolac Palpitations and Other (See Comments)    Joints hurt    Past Medical History, Surgical history, Social history, and Family History were reviewed and  updated.  Review of Systems: As above  Physical Exam:  weight is 137 lb (62.143 kg). Her oral temperature is 98 F (36.7 C). Her blood pressure is 128/59 and her pulse is 70. Her respiration is 16.   Wt Readings from Last 3 Encounters:  03/20/15 137 lb (62.143 kg)  03/06/15 137 lb (62.143 kg)  02/06/15 138 lb (62.596 kg)     Well-developed and well-nourished white female in no obvious distress. Head and neck exam shows no ocular or oral lesions. There are no palpable cervical or supraclavicular lymph nodes. Lungs are clear. Cardiac exam regular rate and rhythm with no murmurs, rubs or bruits. Abdomen is soft. She has good bowel sounds. There is no fluid wave.  she had does have a palpable right inguinal hernia. This appears be a direct inguinal hernia. It is somewhat tender. It is reducible. There is no palpable hepatosplenomegaly. Back exam shows no obvious tenderness over the spine. She has no muscle spasms in his back. Extremity shows no clubbing, cyanosis or edema. she has limited range of motion of the left shoulder. There is some tenderness to palpation over the superior aspect of the left shoulder.  Neurological exam shows no focal neurological deficits. Skin exam shows no rashes, ecchymoses or petechia.  Lab Results  Component Value Date   WBC 5.4 03/20/2015   HGB 12.3 03/20/2015   HCT 38.2 03/20/2015   MCV 87 03/20/2015   PLT 236 03/20/2015     Chemistry      Component Value Date/Time   NA 140 03/20/2015 0844   NA 133* 12/01/2014 2017   K 4.0 03/20/2015 0844   K 3.7 12/01/2014 2017   CL 103 03/20/2015 0844   CL 101 12/01/2014 2017   CO2 27 03/20/2015 0844   CO2 26 12/01/2014 2017   BUN 24* 03/20/2015 0844   BUN 21* 12/01/2014 2017   CREATININE 1.3* 03/20/2015 0844   CREATININE 1.44* 12/01/2014 2017   GLU 104 07/20/2008      Component Value Date/Time   CALCIUM 9.6 03/20/2015 0844   CALCIUM 9.1 12/01/2014 2017   ALKPHOS 45 03/20/2015 0844   ALKPHOS 61 12/01/2014  2017   AST 22 03/20/2015 0844   AST 20 12/01/2014 2017   ALT 16 03/20/2015 0844   ALT 13* 12/01/2014 2017   BILITOT 0.60 03/20/2015 0844   BILITOT 0.7 12/01/2014 2017         Impression and Plan: Cathy Lewis is a 70 year old white female with metastatic renal cell carcinoma. This is clear-cell carcinoma. She was diagnosed back in 2013.   Overall, I'm that she is feeling better. I'm glad that the radiation therapy helped her out.  We will go ahead  with her eighth cycle of treatment.  Her last scans were done in early December. I think that we can probably wait until late February before we do another set.  We will plan to get her back in 2 more weeks.  I spent about 30 minutes with her today.   Volanda Napoleon, MD 1/25/20178:33 AM

## 2015-03-22 ENCOUNTER — Encounter: Payer: Self-pay | Admitting: Hematology & Oncology

## 2015-03-22 ENCOUNTER — Ambulatory Visit: Payer: Self-pay | Admitting: General Surgery

## 2015-03-22 NOTE — H&P (Signed)
Cathy Lewis 03/22/2015 9:39 AM Location: [facility] Patient #: [id number] DOB: April 28, 1945 Married / Language: English / Race: White Female   History of Present Illness Cathy Hollingshead MD; 03/22/2015 10:49 AM) Patient words: RIH.  The patient is a 70 year old female.  Note:She presents today because her known right inguinal hernia is larger and has become symptomatic. She had some constipation and it was particularly uncomfortable that time. The constipation is now resolved and is not quite as uncomfortable. She is having problems with her back and now is wearing her brace. 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 She is not having any problems with voiding. She currently is having problems with a cough and is being treated for that. This has not been resolved yet.  Allergies ([name], CMA; 03/22/2015 9:41 AM) Etodolac *ANALGESICS - ANTI-INFLAMMATORY* Avelox *FLUOROQUINOLONES* TraMADol HCl *ANALGESICS - OPIOID* Azithromycin *CHEMICALS*  Medication History ([name], CMA; 03/22/2015 9:41 AM) Advair Diskus (100-50MCG/DOSE Aero Pow Br Act, Inhalation) Active. AmLODIPine Besylate (5MG  Tablet, Oral) Active. Dicyclomine HCl (20MG  Tablet, Oral) Active. Hydrochlorothiazide (25MG  Tablet, Oral) Active. Meclizine HCl (12.5MG  Tablet, Oral) Active. Meloxicam (7.5MG  Tablet, Oral) Active. Montelukast Sodium (10MG  Tablet, Oral) Active. Ondansetron HCl (8MG  Tablet, Oral) Active. Polyethylene Glycol 3350 (Oral) Active. Ranitidine HCl (150MG  Tablet, Oral) Active. Triamcinolone Acetonide (0.1% Cream, External) Active. Votrient (200MG  Tablet, Oral) Active. Calcium 600 (1500MG  Tablet, Oral) Active. PEG 3350/Electrolytes (240GM For Solution, Oral) Active. Magnesium Gluconate (250MG  Tablet, Oral) Active. Ventolin HFA (108 (90 Base)MCG/ACT Aerosol Soln, Inhalation) Active. Fluticasone Propionate (50MCG/ACT Suspension, Nasal)  Active. Vitamin D3 (1000UNIT Capsule, Oral) Active. Probiotic (Oral) Active. Xgeva (120MG /1.7ML Solution, Subcutaneous) Active. Medications Reconciled    Review of Systems Cathy Hollingshead MD; 03/22/2015 10:50 AM)  Note: She has a cough and is being treated for this. It has not resolved.  No further constipation.  No difficulty with urination.   Vitals ([name] CMA; 03/22/2015 9:40 AM) 03/22/2015 9:40 AM Weight: 139 lb Height: 65in Body Surface Area: 1.69 m Body Mass Index: 23.13 kg/m  Temp.: 97.10F(Temporal)  Pulse: 82 (Regular)  BP: 124/74 (Sitting, Left Arm, Standard)       Physical Exam Cathy Hollingshead MD; 03/22/2015 10:52 AM) The physical exam findings are as follows: Note:General: WDWN in NAD. Pleasant and cooperative. Wearing a back brace.  HEENT: [place]/AT, no facial masses  EYES: no icterus  CV: RRR, no murmur, no JVD.  CHEST: Breath sounds equal and clear. Respirations nonlabored.  ABDOMEN: Soft, nontender, nondistended.  GU: Reducible right inguinal bulge. Left inguinal scar present without bulges.  NEUROLOGIC: Alert and oriented, answers questions appropriately.  PSYCHIATRIC: Normal mood, affect , and behavior.    Assessment & Plan Cathy Hollingshead MD; 03/22/2015 10:54 AM) RIGHT INGUINAL HERNIA (K40.90) Impression: This has become larger and more symptomatic. She currently is dealing with a cough and is struggling to get over this. She is being treated for this by [name] and [name].  Plan: Once she gets over her cough, we will schedule right inguinal hernia repair with mesh as she is interested in proceeding with this. I have explained the procedure, risks, and aftercare of inguinal hernia repair. Risks include but are not limited to bleeding, infection, wound problems, anesthesia, recurrence, bladder or intestine injury, urinary retention, chronic pain, mesh problems. She seems to understand and agrees to  proceed.  [name], MD

## 2015-04-03 ENCOUNTER — Other Ambulatory Visit: Payer: Medicare PPO

## 2015-04-03 ENCOUNTER — Ambulatory Visit (HOSPITAL_BASED_OUTPATIENT_CLINIC_OR_DEPARTMENT_OTHER): Payer: Medicare PPO | Admitting: Family

## 2015-04-03 ENCOUNTER — Other Ambulatory Visit (HOSPITAL_BASED_OUTPATIENT_CLINIC_OR_DEPARTMENT_OTHER): Payer: Medicare PPO

## 2015-04-03 ENCOUNTER — Ambulatory Visit (HOSPITAL_BASED_OUTPATIENT_CLINIC_OR_DEPARTMENT_OTHER): Payer: Medicare PPO

## 2015-04-03 ENCOUNTER — Ambulatory Visit: Payer: Medicare PPO

## 2015-04-03 ENCOUNTER — Ambulatory Visit: Payer: Medicare PPO | Admitting: Family

## 2015-04-03 ENCOUNTER — Other Ambulatory Visit: Payer: Self-pay | Admitting: Hematology & Oncology

## 2015-04-03 VITALS — BP 107/59 | HR 84 | Temp 98.4°F | Resp 20 | Wt 137.0 lb

## 2015-04-03 DIAGNOSIS — C7951 Secondary malignant neoplasm of bone: Secondary | ICD-10-CM

## 2015-04-03 DIAGNOSIS — C649 Malignant neoplasm of unspecified kidney, except renal pelvis: Secondary | ICD-10-CM | POA: Diagnosis not present

## 2015-04-03 DIAGNOSIS — J40 Bronchitis, not specified as acute or chronic: Secondary | ICD-10-CM

## 2015-04-03 DIAGNOSIS — Z5111 Encounter for antineoplastic chemotherapy: Secondary | ICD-10-CM

## 2015-04-03 LAB — CMP (CANCER CENTER ONLY)
ALK PHOS: 51 U/L (ref 26–84)
ALT: 23 U/L (ref 10–47)
AST: 24 U/L (ref 11–38)
Albumin: 3.7 g/dL (ref 3.3–5.5)
BILIRUBIN TOTAL: 0.6 mg/dL (ref 0.20–1.60)
BUN, Bld: 22 mg/dL (ref 7–22)
CALCIUM: 9.2 mg/dL (ref 8.0–10.3)
CO2: 28 mEq/L (ref 18–33)
CREATININE: 1.3 mg/dL — AB (ref 0.6–1.2)
Chloride: 101 mEq/L (ref 98–108)
GLUCOSE: 101 mg/dL (ref 73–118)
Potassium: 3.5 mEq/L (ref 3.3–4.7)
Sodium: 137 mEq/L (ref 128–145)
Total Protein: 7.4 g/dL (ref 6.4–8.1)

## 2015-04-03 LAB — CBC WITH DIFFERENTIAL (CANCER CENTER ONLY)
BASO#: 0 10*3/uL (ref 0.0–0.2)
BASO%: 0.6 % (ref 0.0–2.0)
EOS%: 3.1 % (ref 0.0–7.0)
Eosinophils Absolute: 0.2 10*3/uL (ref 0.0–0.5)
HEMATOCRIT: 40.6 % (ref 34.8–46.6)
HGB: 13 g/dL (ref 11.6–15.9)
LYMPH#: 1 10*3/uL (ref 0.9–3.3)
LYMPH%: 21.5 % (ref 14.0–48.0)
MCH: 28.1 pg (ref 26.0–34.0)
MCHC: 32 g/dL (ref 32.0–36.0)
MCV: 88 fL (ref 81–101)
MONO#: 0.4 10*3/uL (ref 0.1–0.9)
MONO%: 8.1 % (ref 0.0–13.0)
NEUT#: 3.2 10*3/uL (ref 1.5–6.5)
NEUT%: 66.7 % (ref 39.6–80.0)
PLATELETS: 184 10*3/uL (ref 145–400)
RBC: 4.63 10*6/uL (ref 3.70–5.32)
RDW: 17.3 % — ABNORMAL HIGH (ref 11.1–15.7)
WBC: 4.8 10*3/uL (ref 3.9–10.0)

## 2015-04-03 LAB — LACTATE DEHYDROGENASE: LDH: 214 U/L (ref 125–245)

## 2015-04-03 MED ORDER — SODIUM CHLORIDE 0.9 % IV SOLN
Freq: Once | INTRAVENOUS | Status: AC
  Administered 2015-04-03: 11:00:00 via INTRAVENOUS

## 2015-04-03 MED ORDER — SODIUM CHLORIDE 0.9 % IV SOLN
240.0000 mg | Freq: Once | INTRAVENOUS | Status: AC
  Administered 2015-04-03: 240 mg via INTRAVENOUS
  Filled 2015-04-03: qty 20

## 2015-04-03 NOTE — Progress Notes (Signed)
Hematology and Oncology Follow Up Visit  Cathy Lewis [id number] October 06, 1945 70 y.o. 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 She is feeling better. Her shoulder and back pain improved after completing palliative radiation. She states that she has had no new issues and is tolerating treatment well.  No fever, chills, n/v, rash, dizziness, shortness of breath, chest pain, palpitations, abdominal pain or changes in bowel or bladder habits. No lymphadenopathy found on exam. No episodes of bleeding or bruising.  No syncopal episodes or falls. No swelling, numbness or tingling in her extremities. No new joint aches or pains.  She has maintained a good appetite and is staying well-hydrated. Her weight is unchanged.  Medications:    Medication List       This list is accurate as of: 04/03/15  2:10 PM.  Always use your most recent med list.               acetaminophen 500 MG tablet  Commonly known as:  TYLENOL  Take 1,000 mg by mouth every 6 (six) hours as needed. Pain     ADVAIR DISKUS 100-50 MCG/DOSE Aepb  Generic drug:  Fluticasone-Salmeterol  Inhale 1 puff into the lungs 2 (two) times daily.     albuterol 90 MCG/ACT inhaler  Commonly known as:  PROVENTIL,VENTOLIN  Inhale 2 puffs into the lungs every 4 (four) hours as needed. Wheezing     amLODipine 5 MG tablet  Commonly known as:  NORVASC  Take 5 mg by mouth daily.     CALCIUM + D PO  Take 1 tablet by mouth 2 (two) times daily.     cefdinir 300 MG capsule  Commonly known as:  OMNICEF  Take 1 capsule (300 mg total) by mouth 2 (two) times daily.     fish oil-omega-3 fatty acids 1000 MG capsule  Take 1 g by mouth 2 (two) times daily.      FLONASE 50 MCG/ACT nasal spray  Generic drug:  fluticasone  Place 1 spray into the nose 2 (two) times daily as needed. Allergies     hydrochlorothiazide 25 MG tablet  Commonly known as:  HYDRODIURIL  Take 25 mg by mouth daily.     HYDROcodone-acetaminophen 5-325 MG tablet  Commonly known as:  NORCO/VICODIN  Take 5-325 tablets by mouth every 6 (six) hours as needed.     magnesium gluconate 500 MG tablet  Commonly known as:  MAGONATE  Take 500 mg by mouth daily.     meclizine 25 MG tablet  Commonly known as:  ANTIVERT  Take 12.5 mg by mouth 3 (three) times daily as needed. For dizziness.     montelukast 10 MG tablet  Commonly known as:  SINGULAIR  Take 10 mg by mouth at bedtime.     ondansetron 4 MG tablet  Commonly known as:  ZOFRAN  Take 1 tablet (4 mg total) by mouth every 6 (six) hours as needed for nausea or vomiting.     oxyCODONE 5 MG immediate release tablet  Commonly known as:  Oxy IR/ROXICODONE  Take 1 tablet (5 mg total) by mouth every 4 (four) hours as needed for moderate pain, severe pain or  breakthrough pain.     predniSONE 20 MG tablet  Commonly known as:  DELTASONE     rosuvastatin 10 MG tablet  Commonly known as:  CRESTOR  Take 10 mg by mouth at bedtime.     vitamin B-12 1000 MCG tablet  Commonly known as:  CYANOCOBALAMIN  Take 1,000 mcg by mouth daily.     vitamin C 1000 MG tablet  Take 1,000 mg by mouth daily.        Allergies:  Allergies  Allergen Reactions  . Azithromycin   . Gnp [Acetaminophen]     "Cathy Lewis" (cough syrup) "Cathy Lewis" (cough syrup)   . Rofecoxib   . Tramadol Nausea And Vomiting and Other (See Comments)    dizziness   . Avelox [Moxifloxacin Hcl In Nacl] Palpitations and Other (See Comments)    Body ache   . Etodolac Palpitations and Other (See Comments)    Joints hurt Joints hurt  . Moxifloxacin Other (See Comments) and Palpitations    Body ache    Past Medical History, Surgical history, Social history, and Family  History were reviewed and updated.  Review of Systems: All other 10 point review of systems is negative.   Physical Exam:  weight is 137 lb (62.143 kg). Her oral temperature is 98.4 F (36.9 C). Her blood pressure is 107/59 and her pulse is 84. Her respiration is 20.   Wt Readings from Last 3 Encounters:  04/03/15 137 lb (62.143 kg)  03/20/15 137 lb (62.143 kg)  03/06/15 137 lb (62.143 kg)    Ocular: Sclerae unicteric, pupils equal, round and reactive to light Ear-nose-throat: Oropharynx clear, dentition fair Lymphatic: No cervical supraclavicular or axillary adenopathy Lungs no rales or rhonchi, good excursion bilaterally Heart regular rate and rhythm, no murmur appreciated Abd soft, nontender, positive bowel sounds MSK Chronic back pain and tenderness due to metastatic disease, no joint edema Neuro: non-focal, well-oriented, appropriate affect Breasts: Deferred  Lab Results  Component Value Date   WBC 4.8 04/03/2015   HGB 13.0 04/03/2015   HCT 40.6 04/03/2015   MCV 88 04/03/2015   PLT 184 04/03/2015   No results found for: FERRITIN, IRON, TIBC, UIBC, IRONPCTSAT Lab Results  Component Value Date   RBC 4.63 04/03/2015   No results found for: KPAFRELGTCHN, LAMBDASER, KAPLAMBRATIO No results found for: IGGSERUM, IGA, IGMSERUM No results found for: Cathy Lewis, SPEI   Chemistry      Component Value Date/Time   NA 137 04/03/2015 0942   NA 133* 12/01/2014 2017   K 3.5 04/03/2015 0942   K 3.7 12/01/2014 2017   CL 101 04/03/2015 0942   CL 101 12/01/2014 2017   CO2 28 04/03/2015 0942   CO2 26 12/01/2014 2017   BUN 22 04/03/2015 0942   BUN 21* 12/01/2014 2017   CREATININE 1.3* 04/03/2015 0942   CREATININE 1.44* 12/01/2014 2017   GLU 104 07/20/2008      Component Value Date/Time   CALCIUM 9.2 04/03/2015 0942   CALCIUM 9.1 12/01/2014 2017   ALKPHOS 51 04/03/2015 0942   ALKPHOS 61 12/01/2014 2017   AST 24  04/03/2015 0942   AST 20 12/01/2014 2017   ALT 23 04/03/2015 0942   ALT 13* 12/01/2014 2017   BILITOT 0.60 04/03/2015 0942   BILITOT 0.7 12/01/2014 2017     Impression and Plan: Cathy Lewis is a very pleasant 70 yo white female with metastatic clear cell renal carcinoma diagnosed in 2013. She completed palliative radiation  to the spine and left scapula and is feeling much better. She is asymptomatic at this time.  She has tolerated treatment nicely and will proceed with cycle 10 of Opdivo as planned.  We will plan to repeat scans after cycle 11.  She has her current treatment and appointment schedule.  She knows to contact us with any questions or concerns. We can certainly see her sooner if need be.   Eliezer Bottom, NP 2/7/20172:10 PM

## 2015-04-03 NOTE — Patient Instructions (Signed)
Nivolumab injection What is this medicine? NIVOLUMAB (nye VOL ue mab) is a monoclonal antibody. It is used to treat melanoma, lung cancer, kidney cancer, and Hodgkin lymphoma. This medicine may be used for other purposes; ask your health care provider or pharmacist if you have questions. What should I tell my health care provider before I take this medicine? They need to know if you have any of these conditions: -diabetes -immune system problems -kidney disease -liver disease -lung disease -organ transplant -stomach or intestine problems -thyroid disease -an unusual or allergic reaction to nivolumab, other medicines, foods, dyes, or preservatives -pregnant or trying to get pregnant -breast-feeding How should I use this medicine? This medicine is for infusion into a vein. It is given by a health care professional in a hospital or clinic setting. A special MedGuide will be given to you before each treatment. Be sure to read this information carefully each time. Talk to your pediatrician regarding the use of this medicine in children. Special care may be needed. Overdosage: If you think you have taken too much of this medicine contact a poison control center or emergency room at once. NOTE: This medicine is only for you. Do not share this medicine with others. What if I miss a dose? It is important not to miss your dose. Call your doctor or health care professional if you are unable to keep an appointment. What may interact with this medicine? Interactions have not been studied. Give your health care provider a list of all the medicines, herbs, non-prescription drugs, or dietary supplements you use. Also tell them if you smoke, drink alcohol, or use illegal drugs. Some items may interact with your medicine. This list may not describe all possible interactions. Give your health care provider a list of all the medicines, herbs, non-prescription drugs, or dietary supplements you use. Also tell  them if you smoke, drink alcohol, or use illegal drugs. Some items may interact with your medicine. What should I watch for while using this medicine? This drug may make you feel generally unwell. Continue your course of treatment even though you feel ill unless your doctor tells you to stop. You may need blood work done while you are taking this medicine. Do not become pregnant while taking this medicine or for 5 months after stopping it. Women should inform their doctor if they wish to become pregnant or think they might be pregnant. There is a potential for serious side effects to an unborn child. Talk to your health care professional or pharmacist for more information. Do not breast-feed an infant while taking this medicine. What side effects may I notice from receiving this medicine? Side effects that you should report to your doctor or health care professional as soon as possible: -allergic reactions like skin rash, itching or hives, swelling of the face, lips, or tongue -black, tarry stools -blood in the urine -bloody or watery diarrhea -changes in vision -change in sex drive -changes in emotions or moods -chest pain -confusion -cough -decreased appetite -diarrhea -facial flushing -feeling faint or lightheaded -fever, chills -hair loss -hallucination, loss of contact with reality -headache -irritable -joint pain -loss of memory -muscle pain -muscle weakness -seizures -shortness of breath -signs and symptoms of high blood sugar such as dizziness; dry mouth; dry skin; fruity breath; nausea; stomach pain; increased hunger or thirst; increased urination -signs and symptoms of kidney injury like trouble passing urine or change in the amount of urine -signs and symptoms of liver injury like dark yellow or  brown urine; general ill feeling or flu-like symptoms; light-colored stools; loss of appetite; nausea; right upper belly pain; unusually weak or tired; yellowing of the eyes or  skin -stiff neck -swelling of the ankles, feet, hands -weight gain Side effects that usually do not require medical attention (report to your doctor or health care professional if they continue or are bothersome): -bone pain -constipation -tiredness -vomiting This list may not describe all possible side effects. Call your doctor for medical advice about side effects. You may report side effects to FDA at 1-800-FDA-1088. Where should I keep my medicine? This drug is given in a hospital or clinic and will not be stored at home. NOTE: This sheet is a summary. It may not cover all possible information. If you have questions about this medicine, talk to your doctor, pharmacist, or health care provider.    2016, Elsevier/Gold Standard. (2014-07-12 10:03:42)   Denosumab injection What is this medicine? DENOSUMAB (den oh sue mab) slows bone breakdown. Prolia is used to treat osteoporosis in women after menopause and in men. Delton See is used to prevent bone fractures and other bone problems caused by cancer bone metastases. Delton See is also used to treat giant cell tumor of the bone. This medicine may be used for other purposes; ask your health care provider or pharmacist if you have questions. What should I tell my health care provider before I take this medicine? They need to know if you have any of these conditions: -dental disease -eczema -infection or history of infections -kidney disease or on dialysis -low blood calcium or vitamin D -malabsorption syndrome -scheduled to have surgery or tooth extraction -taking medicine that contains denosumab -thyroid or parathyroid disease -an unusual reaction to denosumab, other medicines, foods, dyes, or preservatives -pregnant or trying to get pregnant -breast-feeding How should I use this medicine? This medicine is for injection under the skin. It is given by a health care professional in a hospital or clinic setting. If you are getting Prolia, a  special MedGuide will be given to you by the pharmacist with each prescription and refill. Be sure to read this information carefully each time. For Prolia, talk to your pediatrician regarding the use of this medicine in children. Special care may be needed. For Delton See, talk to your pediatrician regarding the use of this medicine in children. While this drug may be prescribed for children as young as 13 years for selected conditions, precautions do apply. Overdosage: If you think you have taken too much of this medicine contact a poison control center or emergency room at once. NOTE: This medicine is only for you. Do not share this medicine with others. What if I miss a dose? It is important not to miss your dose. Call your doctor or health care professional if you are unable to keep an appointment. What may interact with this medicine? Do not take this medicine with any of the following medications: -other medicines containing denosumab This medicine may also interact with the following medications: -medicines that suppress the immune system -medicines that treat cancer -steroid medicines like prednisone or cortisone This list may not describe all possible interactions. Give your health care provider a list of all the medicines, herbs, non-prescription drugs, or dietary supplements you use. Also tell them if you smoke, drink alcohol, or use illegal drugs. Some items may interact with your medicine. What should I watch for while using this medicine? Visit your doctor or health care professional for regular checks on your progress. Your doctor  or health care professional may order blood tests and other tests to see how you are doing. Call your doctor or health care professional if you get a cold or other infection while receiving this medicine. Do not treat yourself. This medicine may decrease your body's ability to fight infection. You should make sure you get enough calcium and vitamin D while you  are taking this medicine, unless your doctor tells you not to. Discuss the foods you eat and the vitamins you take with your health care professional. See your dentist regularly. Brush and floss your teeth as directed. Before you have any dental work done, tell your dentist you are receiving this medicine. Do not become pregnant while taking this medicine or for 5 months after stopping it. Women should inform their doctor if they wish to become pregnant or think they might be pregnant. There is a potential for serious side effects to an unborn child. Talk to your health care professional or pharmacist for more information. What side effects may I notice from receiving this medicine? Side effects that you should report to your doctor or health care professional as soon as possible: -allergic reactions like skin rash, itching or hives, swelling of the face, lips, or tongue -breathing problems -chest pain -fast, irregular heartbeat -feeling faint or lightheaded, falls -fever, chills, or any other sign of infection -muscle spasms, tightening, or twitches -numbness or tingling -skin blisters or bumps, or is dry, peels, or red -slow healing or unexplained pain in the mouth or jaw -unusual bleeding or bruising Side effects that usually do not require medical attention (Report these to your doctor or health care professional if they continue or are bothersome.): -muscle pain -stomach upset, gas This list may not describe all possible side effects. Call your doctor for medical advice about side effects. You may report side effects to FDA at 1-800-FDA-1088. Where should I keep my medicine? This medicine is only given in a clinic, doctor's office, or other health care setting and will not be stored at home. NOTE: This sheet is a summary. It may not cover all possible information. If you have questions about this medicine, talk to your doctor, pharmacist, or health care provider.    2016, Elsevier/Gold  Standard. (2011-08-11 12:37:47)   Ceftriaxone injection What is this medicine? CEFTRIAXONE (sef try AX one) is a cephalosporin antibiotic. It is used to treat certain kinds of bacterial infections. It will not work for colds, flu, or other viral infections. This medicine may be used for other purposes; ask your health care provider or pharmacist if you have questions. What should I tell my health care provider before I take this medicine? They need to know if you have any of these conditions: -any chronic illness -bowel disease, like colitis -both kidney and liver disease -high bilirubin level in newborn patients -an unusual or allergic reaction to ceftriaxone, other cephalosporin or penicillin antibiotics, foods, dyes, or preservatives -pregnant or trying to get pregnant -breast-feeding How should I use this medicine? This medicine is injected into a muscle or infused it into a vein. It is usually given in a medical office or clinic. If you are to give this medicine you will be taught how to inject it. Follow instructions carefully. Use your doses at regular intervals. Do not take your medicine more often than directed. Do not skip doses or stop your medicine early even if you feel better. Do not stop taking except on your doctor's advice. Talk to your pediatrician regarding the  use of this medicine in children. Special care may be needed. Overdosage: If you think you have taken too much of this medicine contact a poison control center or emergency room at once. NOTE: This medicine is only for you. Do not share this medicine with others. What if I miss a dose? If you miss a dose, take it as soon as you can. If it is almost time for your next dose, take only that dose. Do not take double or extra doses. What may interact with this medicine? Do not take this medicine with any of the following medications: -intravenous calcium This medicine may also interact with the following  medications: -birth control pills This list may not describe all possible interactions. Give your health care provider a list of all the medicines, herbs, non-prescription drugs, or dietary supplements you use. Also tell them if you smoke, drink alcohol, or use illegal drugs. Some items may interact with your medicine. What should I watch for while using this medicine? Tell your doctor or health care professional if your symptoms do not improve or if they get worse. Do not treat diarrhea with over the counter products. Contact your doctor if you have diarrhea that lasts more than 2 days or if it is severe and watery. If you are being treated for a sexually transmitted disease, avoid sexual contact until you have finished your treatment. Having sex can infect your sexual partner. Calcium may bind to this medicine and cause lung or kidney problems. Avoid calcium products while taking this medicine and for 48 hours after taking the last dose of this medicine. What side effects may I notice from receiving this medicine? Side effects that you should report to your doctor or health care professional as soon as possible: -allergic reactions like skin rash, itching or hives, swelling of the face, lips, or tongue -breathing problems -fever, chills -irregular heartbeat -pain when passing urine -seizures -stomach pain, cramps -unusual bleeding, bruising -unusually weak or tired Side effects that usually do not require medical attention (report to your doctor or health care professional if they continue or are bothersome): -diarrhea -dizzy, drowsy -headache -nausea, vomiting -pain, swelling, irritation where injected -stomach upset -sweating This list may not describe all possible side effects. Call your doctor for medical advice about side effects. You may report side effects to FDA at 1-800-FDA-1088. Where should I keep my medicine? Keep out of the reach of children. Store at room temperature  below 25 degrees C (77 degrees F). Protect from light. Throw away any unused vials after the expiration date. NOTE: This sheet is a summary. It may not cover all possible information. If you have questions about this medicine, talk to your doctor, pharmacist, or health care provider.    2016, Elsevier/Gold Standard. (2013-08-29 09:14:54)

## 2015-04-05 ENCOUNTER — Encounter (HOSPITAL_COMMUNITY): Payer: Self-pay

## 2015-04-05 ENCOUNTER — Encounter (HOSPITAL_COMMUNITY)
Admission: RE | Admit: 2015-04-05 | Discharge: 2015-04-05 | Disposition: A | Discharge status: Home / Self Care | Payer: Medicare PPO | Source: Ambulatory Visit | Attending: General Surgery | Admitting: General Surgery

## 2015-04-05 DIAGNOSIS — Z7951 Long term (current) use of inhaled steroids: Secondary | ICD-10-CM | POA: Diagnosis not present

## 2015-04-05 DIAGNOSIS — K219 Gastro-esophageal reflux disease without esophagitis: Secondary | ICD-10-CM | POA: Diagnosis not present

## 2015-04-05 DIAGNOSIS — Z87891 Personal history of nicotine dependence: Secondary | ICD-10-CM | POA: Diagnosis not present

## 2015-04-05 DIAGNOSIS — C7900 Secondary malignant neoplasm of unspecified kidney and renal pelvis: Secondary | ICD-10-CM | POA: Diagnosis not present

## 2015-04-05 DIAGNOSIS — K4090 Unilateral inguinal hernia, without obstruction or gangrene, not specified as recurrent: Secondary | ICD-10-CM | POA: Diagnosis not present

## 2015-04-05 DIAGNOSIS — K4190 Unilateral femoral hernia, without obstruction or gangrene, not specified as recurrent: Secondary | ICD-10-CM | POA: Diagnosis not present

## 2015-04-05 DIAGNOSIS — I10 Essential (primary) hypertension: Secondary | ICD-10-CM | POA: Diagnosis not present

## 2015-04-05 DIAGNOSIS — Z79899 Other long term (current) drug therapy: Secondary | ICD-10-CM | POA: Diagnosis not present

## 2015-04-05 LAB — COMPREHENSIVE METABOLIC PANEL
ALBUMIN: 4.4 g/dL (ref 3.5–5.0)
ALK PHOS: 54 U/L (ref 38–126)
ALT: 18 U/L (ref 14–54)
ANION GAP: 10 (ref 5–15)
AST: 23 U/L (ref 15–41)
BUN: 23 mg/dL — ABNORMAL HIGH (ref 6–20)
CALCIUM: 9.4 mg/dL (ref 8.9–10.3)
CHLORIDE: 102 mmol/L (ref 101–111)
CO2: 27 mmol/L (ref 22–32)
CREATININE: 1.29 mg/dL — AB (ref 0.44–1.00)
GFR, EST AFRICAN AMERICAN: 47 mL/min — AB (ref >60)
GFR, EST NON AFRICAN AMERICAN: 41 mL/min — AB (ref >60)
Glucose, Bld: 98 mg/dL (ref 65–99)
Potassium: 3.6 mmol/L (ref 3.5–5.1)
SODIUM: 139 mmol/L (ref 135–145)
Total Bilirubin: 0.5 mg/dL (ref 0.3–1.2)
Total Protein: 7.3 g/dL (ref 6.5–8.1)

## 2015-04-05 LAB — CBC WITH DIFFERENTIAL/PLATELET
BASOS PCT: 1 %
Basophils Absolute: 0 10*3/uL (ref 0.0–0.1)
EOS ABS: 0.1 10*3/uL (ref 0.0–0.7)
Eosinophils Relative: 3 %
HCT: 40.3 % (ref 36.0–46.0)
HEMOGLOBIN: 12.7 g/dL (ref 12.0–15.0)
Lymphocytes Relative: 21 %
Lymphs Abs: 1.1 10*3/uL (ref 0.7–4.0)
MCH: 28.2 pg (ref 26.0–34.0)
MCHC: 31.5 g/dL (ref 30.0–36.0)
MCV: 89.6 fL (ref 78.0–100.0)
Monocytes Absolute: 0.4 10*3/uL (ref 0.1–1.0)
Monocytes Relative: 8 %
NEUTROS PCT: 67 %
Neutro Abs: 3.4 10*3/uL (ref 1.7–7.7)
Platelets: 204 10*3/uL (ref 150–400)
RBC: 4.5 MIL/uL (ref 3.87–5.11)
RDW: 17.3 % — ABNORMAL HIGH (ref 11.5–15.5)
WBC: 5 10*3/uL (ref 4.0–10.5)

## 2015-04-05 NOTE — Patient Instructions (Addendum)
SATASHA SLICK  04/05/2015   Your procedure is scheduled on: 04-06-15  Report to [facility] Main  Entrance take [facility]  elevators to 3rd floor to  [place] at 1000 AM.  Call this number if you have problems the morning of surgery [phone]   Remember: ONLY 1 PERSON MAY GO WITH YOU TO SHORT STAY TO GET  READY MORNING OF [place].  Do not eat food or drink liquids :After Midnight. Thursday NIGHT     Take these medicines the morning of surgery with A SIP OF WATER: Amlodipine. Ranitidine,  Use Advair.   May use Albuterol, flonase, take hydrocodone if needed DO NOT TAKE ANY DIABETIC MEDICATIONS DAY OF YOUR SURGERY    BRING INHALERS WITH YOU TO HOSPITAL                           You may not have any metal on your body including hair pins and              piercings  Do not wear jewelry, make-up, lotions, powders or perfumes, deodorant             Do not wear nail polish.  Do not shave  48 hours prior to surgery.              Men may shave face and neck.   Do not bring valuables to the hospital. [place].  Contacts, dentures or bridgework may not be worn into surgery.  Leave suitcase in the car. After surgery it may be brought to your room.     Patients discharged the day of surgery will not be allowed to drive home.  Name and phone number of your driver:husband   Ed/ no cell phone   Or son Percell Miller [id number]  Special Instructions: N/A              Please read over the following fact sheets you were given: _____________________________________________________________________             [facility] - Preparing for Surgery Before surgery, you can play an important role.  Because skin is not sterile, your skin needs to be as free of germs as possible.  You can reduce the number of germs on your skin by washing with CHG (chlorahexidine gluconate) soap before surgery.  CHG is an antiseptic cleaner which  kills germs and bonds with the skin to continue killing germs even after washing. Please DO NOT use if you have an allergy to CHG or antibacterial soaps.  If your skin becomes reddened/irritated stop using the CHG and inform your nurse when you arrive at Short Stay. Do not shave (including legs and underarms) for at least 48 hours prior to the first CHG shower.  You may shave your face/neck. Please follow these instructions carefully:  1.  Shower with CHG Soap the night before surgery and the  morning of Surgery.  2.  If you choose to wash your hair, wash your hair first as usual with your  normal  shampoo.  3.  After you shampoo, rinse your hair and body thoroughly to remove the  shampoo.  4.  Use CHG as you would any other liquid soap.  You can apply chg directly  to the skin and wash                       Gently with a scrungie or clean washcloth.  5.  Apply the CHG Soap to your body ONLY FROM THE NECK DOWN.   Do not use on face/ open                           Wound or open sores. Avoid contact with eyes, ears mouth and genitals (private parts).                       Wash face,  Genitals (private parts) with your normal soap.             6.  Wash thoroughly, paying special attention to the area where your surgery  will be performed.  7.  Thoroughly rinse your body with warm water from the neck down.  8.  DO NOT shower/wash with your normal soap after using and rinsing off  the CHG Soap.                9.  Pat yourself dry with a clean towel.            10.  Wear clean pajamas.            11.  Place clean sheets on your bed the night of your first shower and do not  sleep with pets. Day of Surgery : Do not apply any lotions/deodorants the morning of surgery.  Please wear clean clothes to the hospital/surgery center.  FAILURE TO FOLLOW THESE INSTRUCTIONS MAY RESULT IN THE CANCELLATION OF YOUR SURGERY PATIENT SIGNATURE_________________________________  NURSE  SIGNATURE__________________________________  ________________________________________________________________________

## 2015-04-06 ENCOUNTER — Ambulatory Visit (HOSPITAL_COMMUNITY)
Admission: RE | Admit: 2015-04-06 | Discharge: 2015-04-06 | Disposition: A | Discharge status: Home / Self Care | Payer: Medicare PPO | Source: Ambulatory Visit | Attending: General Surgery | Admitting: General Surgery

## 2015-04-06 ENCOUNTER — Encounter (HOSPITAL_COMMUNITY): Payer: Self-pay | Admitting: *Deleted

## 2015-04-06 ENCOUNTER — Encounter (HOSPITAL_COMMUNITY)
Admission: RE | Disposition: A | Discharge status: Home / Self Care | Payer: Self-pay | Source: Ambulatory Visit | Attending: General Surgery

## 2015-04-06 ENCOUNTER — Ambulatory Visit (HOSPITAL_COMMUNITY): Payer: Medicare PPO | Admitting: Certified Registered"

## 2015-04-06 DIAGNOSIS — K4090 Unilateral inguinal hernia, without obstruction or gangrene, not specified as recurrent: Secondary | ICD-10-CM | POA: Insufficient documentation

## 2015-04-06 DIAGNOSIS — C7900 Secondary malignant neoplasm of unspecified kidney and renal pelvis: Secondary | ICD-10-CM | POA: Diagnosis not present

## 2015-04-06 DIAGNOSIS — K219 Gastro-esophageal reflux disease without esophagitis: Secondary | ICD-10-CM | POA: Insufficient documentation

## 2015-04-06 DIAGNOSIS — Z87891 Personal history of nicotine dependence: Secondary | ICD-10-CM | POA: Insufficient documentation

## 2015-04-06 DIAGNOSIS — I10 Essential (primary) hypertension: Secondary | ICD-10-CM | POA: Insufficient documentation

## 2015-04-06 DIAGNOSIS — Z7951 Long term (current) use of inhaled steroids: Secondary | ICD-10-CM | POA: Diagnosis not present

## 2015-04-06 DIAGNOSIS — K4190 Unilateral femoral hernia, without obstruction or gangrene, not specified as recurrent: Secondary | ICD-10-CM | POA: Diagnosis not present

## 2015-04-06 DIAGNOSIS — Z79899 Other long term (current) drug therapy: Secondary | ICD-10-CM | POA: Insufficient documentation

## 2015-04-06 HISTORY — PX: INGUINAL HERNIA REPAIR: SHX194

## 2015-04-06 HISTORY — PX: INSERTION OF MESH: SHX5868

## 2015-04-06 SURGERY — REPAIR, HERNIA, INGUINAL, ADULT
Anesthesia: General | Laterality: Right

## 2015-04-06 MED ORDER — FENTANYL CITRATE (PF) 100 MCG/2ML IJ SOLN
INTRAMUSCULAR | Status: AC
  Filled 2015-04-06: qty 2

## 2015-04-06 MED ORDER — MIDAZOLAM HCL 2 MG/2ML IJ SOLN
INTRAMUSCULAR | Status: AC
  Filled 2015-04-06: qty 2

## 2015-04-06 MED ORDER — HYDROCODONE-ACETAMINOPHEN 5-325 MG PO TABS: 1.0000 | ORAL_TABLET | ORAL | Status: DC | PRN

## 2015-04-06 MED ORDER — BUPIVACAINE-EPINEPHRINE (PF) 0.5% -1:200000 IJ SOLN
INTRAMUSCULAR | Status: DC | PRN
  Administered 2015-04-06: 30 mL via PERINEURAL

## 2015-04-06 MED ORDER — BUPIVACAINE-EPINEPHRINE 0.5% -1:200000 IJ SOLN
INTRAMUSCULAR | Status: DC | PRN
  Administered 2015-04-06: 50 mL

## 2015-04-06 MED ORDER — LACTATED RINGERS IV SOLN
INTRAVENOUS | Status: DC
Start: 2015-04-06 — End: 2015-04-06
  Administered 2015-04-06: 13:00:00 via INTRAVENOUS
  Administered 2015-04-06: 1000 mL via INTRAVENOUS

## 2015-04-06 MED ORDER — ALBUTEROL SULFATE (2.5 MG/3ML) 0.083% IN NEBU
INHALATION_SOLUTION | RESPIRATORY_TRACT | Status: AC
  Filled 2015-04-06: qty 3

## 2015-04-06 MED ORDER — BUPIVACAINE-EPINEPHRINE (PF) 0.5% -1:200000 IJ SOLN
INTRAMUSCULAR | Status: AC
  Filled 2015-04-06: qty 30

## 2015-04-06 MED ORDER — HYDROMORPHONE HCL 1 MG/ML IJ SOLN
INTRAMUSCULAR | Status: AC
  Filled 2015-04-06: qty 1

## 2015-04-06 MED ORDER — LIDOCAINE HCL (CARDIAC) 20 MG/ML IV SOLN
INTRAVENOUS | Status: AC
  Filled 2015-04-06: qty 5

## 2015-04-06 MED ORDER — ONDANSETRON HCL 4 MG/2ML IJ SOLN
INTRAMUSCULAR | Status: DC | PRN
  Administered 2015-04-06 (×2): 2 mg via INTRAVENOUS

## 2015-04-06 MED ORDER — ALBUTEROL SULFATE (2.5 MG/3ML) 0.083% IN NEBU
2.5000 mg | INHALATION_SOLUTION | Freq: Four times a day (QID) | RESPIRATORY_TRACT | Status: DC | PRN
  Administered 2015-04-06: 2.5 mg via RESPIRATORY_TRACT

## 2015-04-06 MED ORDER — PROPOFOL 10 MG/ML IV BOLUS
INTRAVENOUS | Status: DC | PRN
  Administered 2015-04-06: 150 mg via INTRAVENOUS

## 2015-04-06 MED ORDER — MEPERIDINE HCL 50 MG/ML IJ SOLN: 6.2500 mg | INTRAMUSCULAR | Status: DC | PRN

## 2015-04-06 MED ORDER — DEXAMETHASONE SODIUM PHOSPHATE 10 MG/ML IJ SOLN
INTRAMUSCULAR | Status: AC
  Filled 2015-04-06: qty 1

## 2015-04-06 MED ORDER — CEFAZOLIN SODIUM-DEXTROSE 2-3 GM-% IV SOLR
2.0000 g | INTRAVENOUS | Status: AC
  Administered 2015-04-06: 2 g via INTRAVENOUS

## 2015-04-06 MED ORDER — ONDANSETRON HCL 4 MG/2ML IJ SOLN
INTRAMUSCULAR | Status: AC
  Filled 2015-04-06: qty 4

## 2015-04-06 MED ORDER — FENTANYL CITRATE (PF) 100 MCG/2ML IJ SOLN
INTRAMUSCULAR | Status: DC | PRN
  Administered 2015-04-06: 25 ug via INTRAVENOUS
  Administered 2015-04-06: 50 ug via INTRAVENOUS
  Administered 2015-04-06 (×3): 25 ug via INTRAVENOUS

## 2015-04-06 MED ORDER — ONDANSETRON HCL 4 MG/2ML IJ SOLN: 4.0000 mg | Freq: Once | INTRAMUSCULAR | Status: DC | PRN

## 2015-04-06 MED ORDER — DEXAMETHASONE SODIUM PHOSPHATE 10 MG/ML IJ SOLN
INTRAMUSCULAR | Status: DC | PRN
  Administered 2015-04-06: 5 mg via INTRAVENOUS

## 2015-04-06 MED ORDER — BUPIVACAINE-EPINEPHRINE 0.5% -1:200000 IJ SOLN
INTRAMUSCULAR | Status: AC
  Filled 2015-04-06: qty 1

## 2015-04-06 MED ORDER — CEFAZOLIN SODIUM-DEXTROSE 2-3 GM-% IV SOLR
INTRAVENOUS | Status: AC
  Filled 2015-04-06: qty 50

## 2015-04-06 MED ORDER — HYDROMORPHONE HCL 1 MG/ML IJ SOLN
0.2500 mg | INTRAMUSCULAR | Status: DC | PRN
  Administered 2015-04-06 (×2): 0.5 mg via INTRAVENOUS

## 2015-04-06 MED ORDER — PROPOFOL 10 MG/ML IV BOLUS
INTRAVENOUS | Status: AC
  Filled 2015-04-06: qty 20

## 2015-04-06 SURGICAL SUPPLY — 37 items
APL SKNCLS STERI-STRIP NONHPOA (GAUZE/BANDAGES/DRESSINGS) ×1
BENZOIN TINCTURE PRP APPL 2/3 (GAUZE/BANDAGES/DRESSINGS) ×3 IMPLANT
BLADE SURG 15 STRL LF DISP TIS (BLADE) ×1 IMPLANT
BLADE SURG 15 STRL SS (BLADE) ×3
CHLORAPREP W/TINT 26ML (MISCELLANEOUS) ×3 IMPLANT
CLOSURE STERI-STRIP 1/4X4 (GAUZE/BANDAGES/DRESSINGS) ×2 IMPLANT
CLOSURE WOUND 1/2 X4 (GAUZE/BANDAGES/DRESSINGS) ×1
COVER SURGICAL LIGHT HANDLE (MISCELLANEOUS) ×3 IMPLANT
DECANTER SPIKE VIAL GLASS SM (MISCELLANEOUS) ×3 IMPLANT
DRAIN PENROSE 18X1/2 LTX STRL (DRAIN) ×3 IMPLANT
DRAPE INCISE IOBAN 66X45 STRL (DRAPES) ×3 IMPLANT
DRAPE LAPAROTOMY TRNSV 102X78 (DRAPE) ×3 IMPLANT
DRSG TEGADERM 4X4.75 (GAUZE/BANDAGES/DRESSINGS) ×3 IMPLANT
ELECT PENCIL ROCKER SW 15FT (MISCELLANEOUS) ×3 IMPLANT
ELECT REM PT RETURN 9FT ADLT (ELECTROSURGICAL) ×3
ELECTRODE REM PT RTRN 9FT ADLT (ELECTROSURGICAL) ×1 IMPLANT
GAUZE SPONGE 4X4 12PLY STRL (GAUZE/BANDAGES/DRESSINGS) IMPLANT
GLOVE ECLIPSE 8.0 STRL XLNG CF (GLOVE) ×5 IMPLANT
GLOVE INDICATOR 8.0 STRL GRN (GLOVE) ×3 IMPLANT
GOWN STRL REUS W/TWL XL LVL3 (GOWN DISPOSABLE) ×6 IMPLANT
KIT BASIN OR (CUSTOM PROCEDURE TRAY) ×3 IMPLANT
MESH HERNIA 3X6 (Mesh General) ×2 IMPLANT
NDL HYPO 25X1 1.5 SAFETY (NEEDLE) ×1 IMPLANT
NEEDLE HYPO 25X1 1.5 SAFETY (NEEDLE) ×3 IMPLANT
PACK BASIC VI WITH GOWN DISP (CUSTOM PROCEDURE TRAY) ×3 IMPLANT
SPONGE LAP 4X18 X RAY DECT (DISPOSABLE) ×3 IMPLANT
STRIP CLOSURE SKIN 1/2X4 (GAUZE/BANDAGES/DRESSINGS) ×2 IMPLANT
SUT MNCRL AB 4-0 PS2 18 (SUTURE) ×3 IMPLANT
SUT PROLENE 2 0 CT2 30 (SUTURE) ×6 IMPLANT
SUT VIC AB 2-0 SH 18 (SUTURE) ×3 IMPLANT
SUT VIC AB 3-0 SH 27 (SUTURE)
SUT VIC AB 3-0 SH 27XBRD (SUTURE) ×1 IMPLANT
SYR BULB IRRIGATION 50ML (SYRINGE) ×3 IMPLANT
SYR CONTROL 10ML LL (SYRINGE) ×3 IMPLANT
TOWEL OR 17X26 10 PK STRL BLUE (TOWEL DISPOSABLE) ×3 IMPLANT
TOWEL OR NON WOVEN STRL DISP B (DISPOSABLE) ×3 IMPLANT
YANKAUER SUCT BULB TIP 10FT TU (MISCELLANEOUS) ×3 IMPLANT

## 2015-04-06 NOTE — Progress Notes (Signed)
Albuterol neb given per Dr Conrad [place]

## 2015-04-06 NOTE — H&P (View-Only) (Signed)
Cathy Lewis 03/22/2015 9:39 AM Location: [facility] Patient #: [id number] DOB: 1945-10-14 Married / Language: English / Race: White Female   History of Present Illness Cathy Hollingshead MD; 03/22/2015 10:49 AM) Patient words: RIH.  The patient is a 70 year old female.  Note:She presents today because her known right inguinal hernia is larger and has become symptomatic. She had some constipation and it was particularly uncomfortable that time. The constipation is now resolved and is not quite as uncomfortable. She is having problems with her back and now is wearing her brace. 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 She is not having any problems with voiding. She currently is having problems with a cough and is being treated for that. This has not been resolved yet.  Allergies ([name], CMA; 03/22/2015 9:41 AM) Etodolac *ANALGESICS - ANTI-INFLAMMATORY* Avelox *FLUOROQUINOLONES* TraMADol HCl *ANALGESICS - OPIOID* Azithromycin *CHEMICALS*  Medication History ([name], CMA; 03/22/2015 9:41 AM) Advair Diskus (100-50MCG/DOSE Aero Pow Br Act, Inhalation) Active. AmLODIPine Besylate (5MG  Tablet, Oral) Active. Dicyclomine HCl (20MG  Tablet, Oral) Active. Hydrochlorothiazide (25MG  Tablet, Oral) Active. Meclizine HCl (12.5MG  Tablet, Oral) Active. Meloxicam (7.5MG  Tablet, Oral) Active. Montelukast Sodium (10MG  Tablet, Oral) Active. Ondansetron HCl (8MG  Tablet, Oral) Active. Polyethylene Glycol 3350 (Oral) Active. Ranitidine HCl (150MG  Tablet, Oral) Active. Triamcinolone Acetonide (0.1% Cream, External) Active. Votrient (200MG  Tablet, Oral) Active. Calcium 600 (1500MG  Tablet, Oral) Active. PEG 3350/Electrolytes (240GM For Solution, Oral) Active. Magnesium Gluconate (250MG  Tablet, Oral) Active. Ventolin HFA (108 (90 Base)MCG/ACT Aerosol Soln, Inhalation) Active. Fluticasone Propionate (50MCG/ACT Suspension, Nasal)  Active. Vitamin D3 (1000UNIT Capsule, Oral) Active. Probiotic (Oral) Active. Xgeva (120MG /1.7ML Solution, Subcutaneous) Active. Medications Reconciled    Review of Systems Cathy Hollingshead MD; 03/22/2015 10:50 AM)  Note: She has a cough and is being treated for this. It has not resolved.  No further constipation.  No difficulty with urination.   Vitals ([name] CMA; 03/22/2015 9:40 AM) 03/22/2015 9:40 AM Weight: 139 lb Height: 65in Body Surface Area: 1.69 m Body Mass Index: 23.13 kg/m  Temp.: 97.19F(Temporal)  Pulse: 82 (Regular)  BP: 124/74 (Sitting, Left Arm, Standard)       Physical Exam Cathy Hollingshead MD; 03/22/2015 10:52 AM) The physical exam findings are as follows: Note:General: WDWN in NAD. Pleasant and cooperative. Wearing a back brace.  HEENT: [place]/AT, no facial masses  EYES: no icterus  CV: RRR, no murmur, no JVD.  CHEST: Breath sounds equal and clear. Respirations nonlabored.  ABDOMEN: Soft, nontender, nondistended.  GU: Reducible right inguinal bulge. Left inguinal scar present without bulges.  NEUROLOGIC: Alert and oriented, answers questions appropriately.  PSYCHIATRIC: Normal mood, affect , and behavior.    Assessment & Plan Cathy Hollingshead MD; 03/22/2015 10:54 AM) RIGHT INGUINAL HERNIA (K40.90) Impression: This has become larger and more symptomatic. She currently is dealing with a cough and is struggling to get over this. She is being treated for this by [name] and [name].  Plan: Once she gets over her cough, we will schedule right inguinal hernia repair with mesh as she is interested in proceeding with this. I have explained the procedure, risks, and aftercare of inguinal hernia repair. Risks include but are not limited to bleeding, infection, wound problems, anesthesia, recurrence, bladder or intestine injury, urinary retention, chronic pain, mesh problems. She seems to understand and agrees to  proceed.  [name], MD

## 2015-04-06 NOTE — Anesthesia Preprocedure Evaluation (Signed)
Anesthesia Evaluation  Patient identified by MRN, date of birth, ID band Patient awake    Reviewed: Allergy & Precautions, NPO status , Patient's Chart, lab work & pertinent test results  Airway Mallampati: I  TM Distance: >3 FB Neck ROM: Full    Dental   Pulmonary former smoker,    Pulmonary exam normal        Cardiovascular hypertension, Pt. on medications Normal cardiovascular exam     Neuro/Psych    GI/Hepatic GERD  ,  Endo/Other    Renal/GU      Musculoskeletal   Abdominal   Peds  Hematology   Anesthesia Other Findings   Reproductive/Obstetrics                             Anesthesia Physical Anesthesia Plan  ASA: III  Anesthesia Plan: General   Post-op Pain Management:    Induction: Intravenous  Airway Management Planned:   Additional Equipment:   Intra-op Plan:   Post-operative Plan:   Informed Consent:   Plan Discussed with:   Anesthesia Plan Comments:         Anesthesia Quick Evaluation

## 2015-04-06 NOTE — Interval H&P Note (Signed)
History and Physical Interval Note:  04/06/2015 11:52 AM  Cathy Lewis  has presented today for surgery, with the diagnosis of Right inguinal hernia  The various methods of treatment have been discussed with the patient and family. After consideration of risks, benefits and other options for treatment, the patient has consented to  Procedure(s): HERNIA REPAIR RIGHT  INGUINAL ADULT WITH MESH (Right) INSERTION OF MESH (Right) as a surgical intervention .  The patient's history has been reviewed, patient examined, no change in status, stable for surgery.  I have reviewed the patient's chart and labs.  Questions were answered to the patient's satisfaction.     Izacc Demeyer [name]

## 2015-04-06 NOTE — Discharge Instructions (Signed)
[facility]  INGUINAL HERNIA REPAIR: POST OP INSTRUCTIONS  Always review your discharge instruction sheet given to you by the facility where your surgery was performed. IF YOU HAVE DISABILITY OR FAMILY LEAVE FORMS, YOU MUST BRING THEM TO THE OFFICE FOR PROCESSING.   DO NOT GIVE THEM TO YOUR DOCTOR.  1. A  prescription for pain medication may be given to you upon discharge.  Take your pain medication as prescribed, if needed.  If narcotic pain medicine is not needed, then you may take acetaminophen (Tylenol) or ibuprofen (Advil) as needed. 2. Take your usually prescribed medications unless otherwise directed. 3. If you need a refill on your pain medication, please contact your pharmacy.  They will contact our office to request authorization. Prescriptions will not be filled after 5 pm or on week-ends. 4. You should follow a light diet the first 24 hours after arrival home, such as soup and crackers, etc.  Be sure to include lots of fluids daily.  Resume your normal diet the day after surgery. 5. Most patients will experience some swelling and bruising in the groin.  Ice packs and reclining will help.  Swelling and bruising can take several days to resolve.  6. It is common to experience some constipation if taking pain medication after surgery.  Increasing fluid intake and taking a stool softener (such as Colace) will usually help or prevent this problem from occurring.  A mild laxative (Milk of Magnesia or Miralax) should be taken according to package directions if there are no bowel movements after 48 hours. 7. Unless discharge instructions indicate otherwise, you may remove your bandages 72 hours after surgery, and you may shower at that time.  You may have steri-strips (small skin tapes) in place directly over the incision.  These strips should be left on the skin.  If your surgeon used skin glue on the incision, you may shower in 24 hours.  The glue will flake off over  the next 2-3 weeks.  Any sutures or staples will be removed at the office during your follow-up visit. 8. ACTIVITIES:  You may resume regular (light) daily activities beginning the next day--such as daily self-care, walking, climbing stairs--gradually increasing activities as tolerated.  You may have sexual intercourse when it is comfortable.  Refrain from any heavy lifting or straining-nothing over 10 pounds for 6 weeks. a. You may drive when you are no longer taking prescription pain medication, you can comfortably wear a seatbelt, and you can safely maneuver your car and apply brakes. b. RETURN TO WORK:  __________________________________________________________ 9. You should see your doctor in the office for a follow-up appointment approximately 2-3 weeks after your surgery.  Make sure that you call for this appointment within a day or two after you arrive home to insure a convenient appointment time. 10. OTHER INSTRUCTIONS:  __________________________________________________________________________________________________________________________________________________________________________________________  WHEN TO CALL YOUR DOCTOR: 1. Fever over 101.0 2. Inability to urinate 3. Nausea and/or vomiting 4. Extreme swelling or bruising 5. Continued bleeding from incision. 6. Increased pain, redness, or drainage from the incision  The clinic staff is available to answer your questions during regular business hours.  Please dont hesitate to call and ask to speak to one of the nurses for clinical concerns.  If you have a medical emergency, go to the nearest emergency room or call 911.  A surgeon from [facility] is always on call at the hospital   [address], [place], [place], [place]  [address] ?  [address] [address], [place],    [address] °[phone] ? [phone] ? FAX [phone] °Web site: [email or web address] °

## 2015-04-06 NOTE — Transfer of Care (Signed)
Immediate Anesthesia Transfer of Care Note  Patient: Cathy Lewis  Procedure(s) Performed: Procedure(s): HERNIA REPAIR RIGHT  INGUINAL ADULT WITH MESH (Right) INSERTION OF MESH (Right)  Patient Location: PACU  Anesthesia Type:General  Level of Consciousness:  sedated, patient cooperative and responds to stimulation  Airway & Oxygen Therapy:Patient Spontanous Breathing and Patient connected to face mask oxgen  Post-op Assessment:  Report given to PACU RN and Post -op Vital signs reviewed and stable  Post vital signs:  Reviewed and stable  Last Vitals:  Filed Vitals:   04/06/15 0947  BP: 121/62  Pulse: 88  Temp: 36.4 C  Resp: 18    Complications: No apparent anesthesia complications

## 2015-04-06 NOTE — Anesthesia Procedure Notes (Addendum)
Anesthesia Regional Block:  TAP block  Pre-Anesthetic Checklist: ,, timeout performed, Correct Patient, Correct Site, Correct Laterality, Correct Procedure, Correct Position, site marked, Risks and benefits discussed,  Surgical consent,  Pre-op evaluation,  At surgeon's request and post-op pain management  Laterality: Right  Prep: chloraprep and alcohol swabs       Needles:  Injection technique: Single-shot  Needle Type: Echogenic Stimulator Needle     Needle Length: 9cm 9 cm Needle Gauge: 21 and 21 G    Additional Needles:  Procedures: ultrasound guided (picture in chart) TAP block Narrative:  Start time: 04/06/2015 12:00 PM End time: 04/06/2015 12:10 PM Injection made incrementally with aspirations every 5 mL.  Performed by: Personally  Anesthesiologist: Lillia Abed  Additional Notes: Monitors applied. Patient sedated. Sterile prep and drape,hand hygiene and sterile gloves were used. Relevant anatomy identified.Needle position confirmed.Local anesthetic injected incrementally after negative aspiration. Local anesthetic spread visualized in Transversus Abdominus Plane. Vascular puncture avoided. No complications. Image printed for medical record.The patient tolerated the procedure well.       Procedure Name: LMA Insertion Date/Time: 04/06/2015 12:57 PM Performed by: Freddie Breech Pre-anesthesia Checklist: Patient identified, Emergency Drugs available, Suction available, Patient being monitored and Timeout performed Patient Re-evaluated:Patient Re-evaluated prior to inductionOxygen Delivery Method: Circle system utilized Preoxygenation: Pre-oxygenation with 100% oxygen Intubation Type: IV induction LMA: LMA inserted LMA Size: 4.0 Number of attempts: 2 (LMA repositioned due to large cuff leak. ) Airway Equipment and Method: Patient positioned with wedge pillow Placement Confirmation: positive ETCO2,  CO2 detector and breath sounds checked- equal and  bilateral Tube secured with: Tape Dental Injury: Teeth and Oropharynx as per pre-operative assessment

## 2015-04-06 NOTE — Op Note (Signed)
OPERATIVE NOTE-INGUINAL HERNIA REPAIR  Preoperative diagnosis:  Right inguinal hernia.  Postoperative diagnosis:  Indirect right inguinal hernia and right femoral hernia.  Procedure:  Right inguinal and femoral hernia repair with mesh.  Surgeon:  [name], M.D.  Anesthesia:  General/LMA, TAP block, and local (Marcaine).  Indication:  This is a 70 year old female which had a small asymptomatic right and more hernia that has enlarged and become symptomatic. She now presents for repair.  Technique:  She was seen in the holding room and the right groin was marked with my initials. A TAP block was performed by the Anesthesiologist. She was brought to the operating, placed supine on the operating table, and the anesthetic was administered by the anesthesiologist. The hair in the groin area was clipped as was felt to be necessary. This area was then sterilely prepped and draped. A timeout was performed.  Local anesthetic was infiltrated in the superficial and deep tissues in the right groin.  An incision was made through the skin and subcutaneous tissue until the external oblique aponeurosis was identified.  Local anesthetic was infiltrated deep to the external oblique aponeurosis. The external oblique aponeurosis was divided through the external ring medially and back toward the anterior superior iliac spine laterally. Using blunt dissection, the shelving edge of the inguinal ligament was identified inferiorly and the internal oblique aponeurosis and muscle were identified superiorly. The ilioinguinal nerve was identified and preserved.  The round ligament was isolated and a posterior window was made around it. An indirect hernia sac was identified and separated from the round ligament  using blunt dissection. The hernia sac and its contents were reduced through the indirect hernia defect.  While doing this, I notice she also had a femoral hernia.   A piece of 3" x 6" polypropylene mesh was  brought into the field and anchored 1-2 cm medial to the pubic tubercle with 2-0 Prolene suture. The inferior aspect of the mesh was anchored to Cooper's ligament initially and then transitioned to  the shelving edge of the inguinal ligament with running 2-0 Prolene suture to a level 1-2 cm lateral to the internal ring.  The superior aspect of the mesh was anchored to the internal oblique aponeurosis and muscle with interrupted 2-0 Vicryl sutures. The lateral aspect of the mesh was then tucked deep to the external oblique aponeurosis.  The wound was inspected and hemostasis was adequate. The external oblique aponeurosis was then closed over the mesh and cord with running 3-0 Vicryl suture. The subcutaneous tissue was closed with running 3-0 Vicryl suture. The skin closed with a running 4-0 Monocryl subcuticular stitch.  Steri-Strips and a sterile dressing were applied.  The procedure was well-tolerated without any apparent complications and she was taken to the recovery room in satisfactory condition.

## 2015-04-06 NOTE — Progress Notes (Addendum)
Entered room and pt resting.  Asked pt how she was, pt states ok.  Asked pt was she ready to go home and pt states yeah.  Pt asked for pain pill, informed pt I had to get the pill and then she would have to stay a little while longer.  Pt politely states no, never mind I'll just take the pill at home.  Offered pill again and pt states she would wait.  Pt rates pain 5/10.  Pt iv d/c, pressure dressing applied and  pt getting dressed.

## 2015-04-06 NOTE — Anesthesia Postprocedure Evaluation (Signed)
Anesthesia Post Note  Patient: Cathy Lewis  Procedure(s) Performed: Procedure(s) (LRB): HERNIA REPAIR RIGHT  INGUINAL ADULT WITH MESH (Right) INSERTION OF MESH (Right)  Patient location during evaluation: PACU Anesthesia Type: General Level of consciousness: awake and alert Pain management: pain level controlled Vital Signs Assessment: post-procedure vital signs reviewed and stable Respiratory status: spontaneous breathing, nonlabored ventilation, respiratory function stable and patient connected to nasal cannula oxygen Cardiovascular status: blood pressure returned to baseline and stable Postop Assessment: no signs of nausea or vomiting Anesthetic complications: no    Last Vitals:  Filed Vitals:   04/06/15 1415 04/06/15 1430  BP: 125/67 127/75  Pulse: 100 95  Temp: 36.7 C   Resp: 21 25    Last Pain:  Filed Vitals:   04/06/15 1431  PainSc: Asleep                 Casimer Russett DAVID

## 2015-04-09 NOTE — Addendum Note (Signed)
Addendum  created 04/09/15 0816 by Lollie Sails, CRNA   Modules edited: Charges VN

## 2015-04-12 ENCOUNTER — Encounter (HOSPITAL_COMMUNITY): Payer: Self-pay | Admitting: General Surgery

## 2015-04-17 ENCOUNTER — Other Ambulatory Visit (HOSPITAL_BASED_OUTPATIENT_CLINIC_OR_DEPARTMENT_OTHER): Payer: Medicare PPO

## 2015-04-17 ENCOUNTER — Ambulatory Visit (HOSPITAL_BASED_OUTPATIENT_CLINIC_OR_DEPARTMENT_OTHER): Payer: Medicare PPO | Admitting: Hematology & Oncology

## 2015-04-17 ENCOUNTER — Encounter: Payer: Self-pay | Admitting: Hematology & Oncology

## 2015-04-17 ENCOUNTER — Ambulatory Visit (HOSPITAL_BASED_OUTPATIENT_CLINIC_OR_DEPARTMENT_OTHER): Payer: Medicare PPO

## 2015-04-17 VITALS — BP 114/56 | HR 83 | Temp 97.9°F | Resp 20 | Ht 65.0 in | Wt 136.0 lb

## 2015-04-17 DIAGNOSIS — C649 Malignant neoplasm of unspecified kidney, except renal pelvis: Secondary | ICD-10-CM

## 2015-04-17 DIAGNOSIS — R109 Unspecified abdominal pain: Secondary | ICD-10-CM | POA: Diagnosis not present

## 2015-04-17 DIAGNOSIS — R0602 Shortness of breath: Secondary | ICD-10-CM | POA: Diagnosis not present

## 2015-04-17 DIAGNOSIS — J45901 Unspecified asthma with (acute) exacerbation: Secondary | ICD-10-CM

## 2015-04-17 DIAGNOSIS — Z5112 Encounter for antineoplastic immunotherapy: Secondary | ICD-10-CM

## 2015-04-17 DIAGNOSIS — C7951 Secondary malignant neoplasm of bone: Secondary | ICD-10-CM

## 2015-04-17 DIAGNOSIS — R062 Wheezing: Secondary | ICD-10-CM | POA: Diagnosis not present

## 2015-04-17 LAB — CMP (CANCER CENTER ONLY)
ALK PHOS: 56 U/L (ref 26–84)
ALT: 18 U/L (ref 10–47)
AST: 24 U/L (ref 11–38)
Albumin: 3.3 g/dL (ref 3.3–5.5)
BUN: 22 mg/dL (ref 7–22)
CO2: 30 mEq/L (ref 18–33)
Calcium: 9.5 mg/dL (ref 8.0–10.3)
Chloride: 100 mEq/L (ref 98–108)
Creat: 1.5 mg/dl — ABNORMAL HIGH (ref 0.6–1.2)
GLUCOSE: 82 mg/dL (ref 73–118)
POTASSIUM: 4 meq/L (ref 3.3–4.7)
Sodium: 135 mEq/L (ref 128–145)
Total Bilirubin: 0.5 mg/dl (ref 0.20–1.60)
Total Protein: 7.2 g/dL (ref 6.4–8.1)

## 2015-04-17 LAB — CBC WITH DIFFERENTIAL (CANCER CENTER ONLY)
BASO#: 0 10*3/uL (ref 0.0–0.2)
BASO%: 0.5 % (ref 0.0–2.0)
EOS%: 0.4 % (ref 0.0–7.0)
Eosinophils Absolute: 0 10*3/uL (ref 0.0–0.5)
HCT: 36.8 % (ref 34.8–46.6)
HGB: 11.7 g/dL (ref 11.6–15.9)
LYMPH#: 1.3 10*3/uL (ref 0.9–3.3)
LYMPH%: 17.7 % (ref 14.0–48.0)
MCH: 27.9 pg (ref 26.0–34.0)
MCHC: 31.8 g/dL — ABNORMAL LOW (ref 32.0–36.0)
MCV: 88 fL (ref 81–101)
MONO#: 0.8 10*3/uL (ref 0.1–0.9)
MONO%: 11.2 % (ref 0.0–13.0)
NEUT#: 5.2 10*3/uL (ref 1.5–6.5)
NEUT%: 70.2 % (ref 39.6–80.0)
PLATELETS: 304 10*3/uL (ref 145–400)
RBC: 4.2 10*6/uL (ref 3.70–5.32)
RDW: 17.4 % — AB (ref 11.1–15.7)
WBC: 7.4 10*3/uL (ref 3.9–10.0)

## 2015-04-17 LAB — LACTATE DEHYDROGENASE: LDH: 219 U/L (ref 125–245)

## 2015-04-17 MED ORDER — SODIUM CHLORIDE 0.9 % IV SOLN
240.0000 mg | Freq: Once | INTRAVENOUS | Status: AC
  Administered 2015-04-17: 240 mg via INTRAVENOUS
  Filled 2015-04-17: qty 20

## 2015-04-17 MED ORDER — ALBUTEROL SULFATE (2.5 MG/3ML) 0.083% IN NEBU
2.5000 mg | INHALATION_SOLUTION | Freq: Once | RESPIRATORY_TRACT | Status: AC
  Administered 2015-04-17: 2.5 mg via RESPIRATORY_TRACT
  Filled 2015-04-17: qty 3

## 2015-04-17 MED ORDER — IPRATROPIUM BROMIDE 0.02 % IN SOLN
0.5000 mg | Freq: Once | RESPIRATORY_TRACT | Status: AC
  Administered 2015-04-17: 0.5 mg via RESPIRATORY_TRACT

## 2015-04-17 MED ORDER — METHYLPREDNISOLONE SODIUM SUCC 125 MG IJ SOLR
INTRAMUSCULAR | Status: AC
  Filled 2015-04-17: qty 2

## 2015-04-17 MED ORDER — IPRATROPIUM BROMIDE 0.02 % IN SOLN
RESPIRATORY_TRACT | Status: AC
  Filled 2015-04-17: qty 2.5

## 2015-04-17 MED ORDER — DENOSUMAB 120 MG/1.7ML ~~LOC~~ SOLN
120.0000 mg | Freq: Once | SUBCUTANEOUS | Status: AC
  Administered 2015-04-17: 120 mg via SUBCUTANEOUS
  Filled 2015-04-17: qty 1.7

## 2015-04-17 MED ORDER — SODIUM CHLORIDE 0.9 % IV SOLN
Freq: Once | INTRAVENOUS | Status: AC
  Administered 2015-04-17: 11:00:00 via INTRAVENOUS

## 2015-04-17 MED ORDER — METHYLPREDNISOLONE SODIUM SUCC 125 MG IJ SOLR
125.0000 mg | Freq: Once | INTRAMUSCULAR | Status: AC
  Administered 2015-04-17: 125 mg via INTRAVENOUS

## 2015-04-17 MED ORDER — IPRATROPIUM-ALBUTEROL 0.5-2.5 (3) MG/3ML IN SOLN: 3.0000 mL | Freq: Four times a day (QID) | RESPIRATORY_TRACT | Status: DC

## 2015-04-17 NOTE — Patient Instructions (Signed)
[facility] Discharge Instructions for Patients Receiving Chemotherapy  Today you received the following chemotherapy agents Opdivo    If you develop nausea and vomiting that is not controlled by your nausea medication, call the clinic. If it is after clinic hours your family physician or the after hours number for the clinic or go to the Emergency Department.   BELOW ARE SYMPTOMS THAT SHOULD BE REPORTED IMMEDIATELY:  *FEVER GREATER THAN 100.5 F  *CHILLS WITH OR WITHOUT FEVER  NAUSEA AND VOMITING THAT IS NOT CONTROLLED WITH YOUR NAUSEA MEDICATION  *UNUSUAL SHORTNESS OF BREATH  *UNUSUAL BRUISING OR BLEEDING  TENDERNESS IN MOUTH AND THROAT WITH OR WITHOUT PRESENCE OF ULCERS  *URINARY PROBLEMS  *BOWEL PROBLEMS  UNUSUAL RASH Items with * indicate a potential emergency and should be followed up as soon as possible.  One of the nurses will contact you 24 hours after your treatment. Please let the nurse know about any problems that you may have experienced. Feel free to call the clinic you have any questions or concerns. The clinic phone number is [phone].   I have been informed and understand all the instructions given to me. I know to contact the clinic, my physician, or go to the Emergency Department if any problems should occur. I do not have any questions at this time, but understand that I may call the clinic during office hours   should I have any questions or need assistance in obtaining follow up care.    __________________________________________  _____________  __________ Signature of Patient or Authorized Representative            Date                   Time    __________________________________________ Nurse's Signature   Nivolumab injection What is this medicine? NIVOLUMAB (nye VOL ue mab) is used to treat certain types of melanoma and lung cancer. This medicine may be used for other purposes; ask your health care provider or pharmacist if  you have questions. COMMON BRAND NAME(S): Opdivo What should I tell my health care provider before I take this medicine? They need to know if you have any of these conditions: -eye disease, vision problems -history of pancreatitis -immune system problems -inflammatory bowel disease -kidney disease -liver disease -lung disease -lupus -myasthenia gravis -multiple sclerosis -organ transplant -stomach or intestine problems -thyroid disease -tingling of the fingers or toes, or other nerve disorder -an unusual or allergic reaction to nivolumab, other medicines, foods, dyes, or preservatives -pregnant or trying to get pregnant -breast-feeding How should I use this medicine? This medicine is for infusion into a vein. It is given by a health care professional in a hospital or clinic setting. A special MedGuide will be given to you before each treatment. Be sure to read this information carefully each time. Talk to your pediatrician regarding the use of this medicine in children. Special care may be needed. Overdosage: If you think you've taken too much of this medicine contact a poison control center or emergency room at once. Overdosage: If you think you have taken too much of this medicine contact a poison control center or emergency room at once. NOTE: This medicine is only for you. Do not share this medicine with others. What if I miss a dose? It is important not to miss your dose. Call your doctor or health care professional if you are unable to keep an appointment. What may interact with this medicine? Interactions have   not been studied. This list may not describe all possible interactions. Give your health care provider a list of all the medicines, herbs, non-prescription drugs, or dietary supplements you use. Also tell them if you smoke, drink alcohol, or use illegal drugs. Some items may interact with your medicine. What should I watch for while using this medicine? Tell your doctor  or healthcare professional if your symptoms do not start to get better or if they get worse. Your condition will be monitored carefully while you are receiving this medicine. You may need blood work done while you are taking this medicine. What side effects may I notice from receiving this medicine? Side effects that you should report to your doctor or health care professional as soon as possible: -allergic reactions like skin rash, itching or hives, swelling of the face, lips, or tongue -black, tarry stools -bloody or watery diarrhea -changes in vision -chills -cough -depressed mood -eye pain -feeling anxious -fever -general ill feeling or flu-like symptoms -hair loss -loss of appetite -low blood counts - this medicine may decrease the number of white blood cells, red blood cells and platelets. You may be at increased risk for infections and bleeding -pain, tingling, numbness in the hands or feet -redness, blistering, peeling or loosening of the skin, including inside the mouth -red pinpoint spots on skin -signs of decreased platelets or bleeding - bruising, pinpoint red spots on the skin, black, tarry stools, blood in the urine -signs of decreased red blood cells - unusually weak or tired, feeling faint or lightheaded, falls -signs of infection - fever or chills, cough, sore throat, pain or trouble passing urine -signs and symptoms of a dangerous change in heartbeat or heart rhythm like chest pain; dizziness; fast or irregular heartbeat; palpitations; feeling faint or lightheaded, falls; breathing problems -signs and symptoms of high blood sugar such as dizziness; dry mouth; dry skin; fruity breath; nausea; stomach pain; increased hunger or thirst; increased urination -signs and symptoms of kidney injury like trouble passing urine or change in the amount of urine -signs and symptoms of liver injury like dark yellow or brown urine; general ill feeling or flu-like symptoms; light-colored  stools; loss of appetite; nausea; right upper belly pain; unusually weak or tired; yellowing of the eyes or skin -signs and symptoms of increased potassium like muscle weakness; chest pain; or fast, irregular heartbeat -signs and symptoms of low potassium like muscle cramps or muscle pain; chest pain; dizziness; feeling faint or lightheaded, falls; palpitations; breathing problems; or fast, irregular heartbeat -swelling of the ankles, feet, hands -weight gainSide effects that usually do not require medical attention (report to your doctor or health care professional if they continue or are bothersome): -constipation -general ill feeling or flu-like symptoms -hair loss -loss of appetite -nausea, vomiting This list may not describe all possible side effects. Call your doctor for medical advice about side effects. You may report side effects to FDA at 1-800-FDA-1088. Where should I keep my medicine? This drug is given in a hospital or clinic and will not be stored at home. NOTE: This sheet is a summary. It may not cover all possible information. If you have questions about this medicine, talk to your doctor, pharmacist, or health care provider.  2015, Elsevier/Gold Standard. (2013-05-02 13:18:19)   Denosumab injection What is this medicine? DENOSUMAB (den oh sue mab) slows bone breakdown. Prolia is used to treat osteoporosis in women after menopause and in men. Delton See is used to prevent bone fractures and other bone problems  caused by cancer bone metastases. Delton See is also used to treat giant cell tumor of the bone. This medicine may be used for other purposes; ask your health care provider or pharmacist if you have questions. What should I tell my health care provider before I take this medicine? They need to know if you have any of these conditions: -dental disease -eczema -infection or history of infections -kidney disease or on dialysis -low blood calcium or vitamin D -malabsorption  syndrome -scheduled to have surgery or tooth extraction -taking medicine that contains denosumab -thyroid or parathyroid disease -an unusual reaction to denosumab, other medicines, foods, dyes, or preservatives -pregnant or trying to get pregnant -breast-feeding How should I use this medicine? This medicine is for injection under the skin. It is given by a health care professional in a hospital or clinic setting. If you are getting Prolia, a special MedGuide will be given to you by the pharmacist with each prescription and refill. Be sure to read this information carefully each time. For Prolia, talk to your pediatrician regarding the use of this medicine in children. Special care may be needed. For Delton See, talk to your pediatrician regarding the use of this medicine in children. While this drug may be prescribed for children as young as 13 years for selected conditions, precautions do apply. Overdosage: If you think you have taken too much of this medicine contact a poison control center or emergency room at once. NOTE: This medicine is only for you. Do not share this medicine with others. What if I miss a dose? It is important not to miss your dose. Call your doctor or health care professional if you are unable to keep an appointment. What may interact with this medicine? Do not take this medicine with any of the following medications: -other medicines containing denosumab This medicine may also interact with the following medications: -medicines that suppress the immune system -medicines that treat cancer -steroid medicines like prednisone or cortisone This list may not describe all possible interactions. Give your health care provider a list of all the medicines, herbs, non-prescription drugs, or dietary supplements you use. Also tell them if you smoke, drink alcohol, or use illegal drugs. Some items may interact with your medicine. What should I watch for while using this medicine? Visit  your doctor or health care professional for regular checks on your progress. Your doctor or health care professional may order blood tests and other tests to see how you are doing. Call your doctor or health care professional if you get a cold or other infection while receiving this medicine. Do not treat yourself. This medicine may decrease your body's ability to fight infection. You should make sure you get enough calcium and vitamin D while you are taking this medicine, unless your doctor tells you not to. Discuss the foods you eat and the vitamins you take with your health care professional. See your dentist regularly. Brush and floss your teeth as directed. Before you have any dental work done, tell your dentist you are receiving this medicine. Do not become pregnant while taking this medicine or for 5 months after stopping it. Women should inform their doctor if they wish to become pregnant or think they might be pregnant. There is a potential for serious side effects to an unborn child. Talk to your health care professional or pharmacist for more information. What side effects may I notice from receiving this medicine? Side effects that you should report to your doctor or health care  professional as soon as possible: -allergic reactions like skin rash, itching or hives, swelling of the face, lips, or tongue -breathing problems -chest pain -fast, irregular heartbeat -feeling faint or lightheaded, falls -fever, chills, or any other sign of infection -muscle spasms, tightening, or twitches -numbness or tingling -skin blisters or bumps, or is dry, peels, or red -slow healing or unexplained pain in the mouth or jaw -unusual bleeding or bruising Side effects that usually do not require medical attention (Report these to your doctor or health care professional if they continue or are bothersome.): -muscle pain -stomach upset, gas This list may not describe all possible side effects. Call your  doctor for medical advice about side effects. You may report side effects to FDA at 1-800-FDA-1088. Where should I keep my medicine? This medicine is only given in a clinic, doctor's office, or other health care setting and will not be stored at home. NOTE: This sheet is a summary. It may not cover all possible information. If you have questions about this medicine, talk to your doctor, pharmacist, or health care provider.    2016, Elsevier/Gold Standard. (2011-08-11 12:37:47)

## 2015-04-17 NOTE — Progress Notes (Signed)
Hematology and Oncology Follow Up Visit  MCKAYLYN DOESCHER [id number] Aug 19, 1945 70 y.o. 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 Purviance is here today for a follow-up.  She is feeling okay. She does have a little bit of abdominal pain. She underwent a right hernia surgery repair week or so ago. She had through this without difficulty.  She is complaining of some shortness of breath. She has little wheezing. She has been on an inhaler before. She has had no productive cough. She's had no hemoptysis.  I will try her on some IV steroids. She is on some oral steroids. She will also get a nebulizer.  She has recovered well from the radiation that she had for her left scapula. She has improved quite nicely. She has had decreased pain.  She's had no change in bowel or bladder habits.  She's had no leg swelling. She's had no joint swelling. She's had no headache.  Her last scans were done back in December. We will have to get another set of scans on her.  Overall, her performance status is ECOG 1.    Medications:    Medication List       This list is accurate as of: 04/17/15  1:14 PM.  Always use your most recent med list.               acetaminophen 500 MG tablet  Commonly known as:  TYLENOL  Take 1,000 mg by mouth every 6 (six) hours as needed. Pain     ADVAIR DISKUS 250-50 MCG/DOSE Aepb  Generic drug:  Fluticasone-Salmeterol  Inhale 1 puff into the lungs 2 (two) times daily.     ADVAIR DISKUS 100-50 MCG/DOSE Aepb  Generic drug:  Fluticasone-Salmeterol  Inhale 1 puff into the lungs 2 (two) times daily.     albuterol 90 MCG/ACT inhaler  Commonly known as:  PROVENTIL,VENTOLIN  Inhale 2 puffs into the lungs every 4 (four) hours as needed. Wheezing     amLODipine 5 MG tablet  Commonly known as:  NORVASC  Take 5 mg by mouth daily.     CALCIUM + D PO  Take 1 tablet by mouth daily.     dicyclomine 20 MG tablet  Commonly known as:  BENTYL  Take 20 mg by mouth 3 (three) times daily as needed (Stomach Cramps).     FLONASE 50 MCG/ACT nasal spray  Generic drug:  fluticasone  Place 1 spray into the nose 2 (two) times daily as needed. Allergies     hydrochlorothiazide 25 MG tablet  Commonly known as:  HYDRODIURIL  Take 25 mg by mouth daily.     HYDROcodone-acetaminophen 5-325 MG tablet  Commonly known as:  NORCO/VICODIN  Take 1-2 tablets by mouth every 4 (four) hours as needed for moderate pain or severe pain.     Magnesium 250 MG Tabs  Take 250 mg by mouth daily.     meclizine 25 MG tablet  Commonly known as:  ANTIVERT  Take 12.5 mg by mouth 3 (three) times daily as needed. For dizziness.     montelukast 10 MG tablet  Commonly known as:  SINGULAIR  Take 10 mg by mouth at bedtime.     ondansetron 4 MG tablet  Commonly  known as:  ZOFRAN  Take 1 tablet (4 mg total) by mouth every 6 (six) hours as needed for nausea or vomiting.     OSTEO BI-FLEX ADV DOUBLE ST PO  Take 1 tablet by mouth daily.     predniSONE 5 MG tablet  Commonly known as:  DELTASONE  TAKE 3 TABLETS BY MOUTH EVERY DAY X3 DAYS THEN 2 TABS DAILY X3 DAYS THEN 1 TAB DAILY X3 DAYS     PRESCRIPTION MEDICATION  every 14 (fourteen) days. Pt goes to Dr. Antonieta Pert office at [facility] [place] every 14 days to get treatment.     PROBIOTIC DAILY PO  Take by mouth.     ranitidine 150 MG tablet  Commonly known as:  ZANTAC  Take 150 mg by mouth 2 (two) times daily.     trolamine salicylate 10 % cream  Commonly known as:  ASPERCREME  Apply 1 application topically 2 (two) times daily as needed for muscle pain.     vitamin C 500 MG tablet  Commonly known as:  ASCORBIC ACID  Take 500 mg by mouth daily.     XGEVA 120 MG/1.7ML Soln injection  Generic drug:   denosumab  Inject 120 mg into the skin every 30 (thirty) days.        Allergies:  Allergies  Allergen Reactions  . Azithromycin     Stomach cramps. Green black stool   . Gnp [Acetaminophen]     "GNR" (cough syrup) "GNR" (cough syrup)   . Rofecoxib     Pt unsure   . Tramadol Nausea And Vomiting and Other (See Comments)    dizziness   . Avelox [Moxifloxacin Hcl In Nacl] Palpitations and Other (See Comments)    Body ache   . Etodolac Palpitations and Other (See Comments)    Joints hurt Joints hurt  . Moxifloxacin Other (See Comments) and Palpitations    Body ache    Past Medical History, Surgical history, Social history, and Family History were reviewed and updated.  Review of Systems: All other 10 point review of systems is negative.   Physical Exam:  height is 5\' 5"  (1.651 m) and weight is 136 lb (61.689 kg). Her oral temperature is 97.9 F (36.6 C). Her blood pressure is 114/56 and her pulse is 83. Her respiration is 20.   Wt Readings from Last 3 Encounters:  04/17/15 136 lb (61.689 kg)  04/06/15 137 lb (62.143 kg)  04/05/15 137 lb (62.143 kg)    Well-developed well-nourished white female in no obvious distress. Head exam shows no ocular or oral lesions. There are no palpable cervical or supraclavicular lymph nodes. Lungs are clear. She has some slight wheezing in the central portion of the lungs. She has no rhonchi. Cardiac exam regular rate and rhythm with no murmurs, rubs or bruits. Abdomen is soft. She has the healing hernia incision in the right lower quadrant. There is no erythema surrounding this. There is no palpable fluid wave. There is no palpable abdominal mass. There is no palpable liver or spleen tip. Extremities shows no clubbing, cyanosis or edema. Neurological exam shows no focal neurological deficits. Skin exam shows no rashes, ecchymoses or petechia.  Lab Results  Component Value Date   WBC 7.4 04/17/2015   HGB 11.7 04/17/2015   HCT 36.8 04/17/2015    MCV 88 04/17/2015   PLT 304 04/17/2015   No results found for: FERRITIN, IRON, TIBC, UIBC, IRONPCTSAT Lab Results  Component Value Date   RBC 4.20 04/17/2015  No results found for: KPAFRELGTCHN, LAMBDASER, KAPLAMBRATIO No results found for: IGGSERUM, IGA, IGMSERUM No results found for: Odetta Pink, SPEI   Chemistry      Component Value Date/Time   NA 135 04/17/2015 0913   NA 139 04/05/2015 1030   K 4.0 04/17/2015 0913   K 3.6 04/05/2015 1030   CL 100 04/17/2015 0913   CL 102 04/05/2015 1030   CO2 30 04/17/2015 0913   CO2 27 04/05/2015 1030   BUN 22 04/17/2015 0913   BUN 23* 04/05/2015 1030   CREATININE 1.5* 04/17/2015 0913   CREATININE 1.29* 04/05/2015 1030   GLU 104 07/20/2008      Component Value Date/Time   CALCIUM 9.5 04/17/2015 0913   CALCIUM 9.4 04/05/2015 1030   ALKPHOS 56 04/17/2015 0913   ALKPHOS 54 04/05/2015 1030   AST 24 04/17/2015 0913   AST 23 04/05/2015 1030   ALT 18 04/17/2015 0913   ALT 18 04/05/2015 1030   BILITOT 0.50 04/17/2015 0913   BILITOT 0.5 04/05/2015 1030     Impression and Plan: Ms. Panzer is a very pleasant 70 yo white female with metastatic clear cell renal carcinoma diagnosed in 2013. She completed palliative radiation to the spine and left scapula and is feeling much better. She is asymptomatic at this time.   She recently had the hernia surgery. She's recovering from this quite nicely. She has tolerated treatment nicely and will proceed with cycle 10 of Opdivo as planned.  I'll plan for her scans to be done in about 3 weeks. We'll then her back afterwards.Volanda Napoleon, MD 2/21/20171:14 PM

## 2015-05-02 ENCOUNTER — Encounter: Payer: Self-pay | Admitting: Internal Medicine

## 2015-05-02 ENCOUNTER — Ambulatory Visit (INDEPENDENT_AMBULATORY_CARE_PROVIDER_SITE_OTHER): Payer: Medicare PPO | Admitting: Internal Medicine

## 2015-05-02 ENCOUNTER — Ambulatory Visit (INDEPENDENT_AMBULATORY_CARE_PROVIDER_SITE_OTHER)
Admission: RE | Admit: 2015-05-02 | Discharge: 2015-05-02 | Disposition: A | Discharge status: Home / Self Care | Payer: Medicare PPO | Source: Ambulatory Visit | Attending: Internal Medicine | Admitting: Internal Medicine

## 2015-05-02 VITALS — BP 104/60 | HR 90 | Ht 65.0 in | Wt 137.0 lb

## 2015-05-02 DIAGNOSIS — J45991 Cough variant asthma: Secondary | ICD-10-CM | POA: Insufficient documentation

## 2015-05-02 MED ORDER — OMEPRAZOLE 20 MG PO CPDR: 20.0000 mg | DELAYED_RELEASE_CAPSULE | Freq: Every day | ORAL | Status: DC

## 2015-05-02 MED ORDER — MOMETASONE FURO-FORMOTEROL FUM 100-5 MCG/ACT IN AERO: INHALATION_SPRAY | RESPIRATORY_TRACT | Status: DC

## 2015-05-02 NOTE — Progress Notes (Signed)
   Subjective:    Patient ID: Cathy Lewis, female    DOB: July 29, 1945,    MRN: [id number]  HPI  9 yowf quit smoking 1987 with onset in late 90s with asthma well controlled on advair historically but worse since December 2016 with sense of persistent congestion/ wheezing def better with abx but not resolved so self referred to pulmonary clinic 05/02/2015    05/02/2015 1st [facility] office visit/ Mavis Gravelle   Chief Complaint  Patient presents with  . Pulmonary Consult    Self referral. Pt c/o chest congestion and cough for the past few months. She states these symptoms come and go, and today are not bothering her. She states cold air and laughing are things that trigger symptoms.   insidious onset x sev months persistently mostly dry cough worse after supper and sometimes hs  s sign sob  No obvious day to day or daytime variability or assoc excess/ purulent sputum or mucus plugs  cp or chest tightness, subjective wheeze or overt sinus or hb symptoms. No unusual exp hx or h/o childhood pna/ asthma or knowledge of premature birth.  Sleeping ok without nocturnal  or early am exacerbation  of respiratory  c/o's or need for noct saba. Also denies any obvious fluctuation of symptoms with weather or environmental changes or other aggravating or alleviating factors except as outlined above   Current Medications, Allergies, Complete Past Medical History, Past Surgical History, Family History, and Social History were reviewed in [employer] record.           Review of Systems  Constitutional: Negative for fever, chills and unexpected weight change.  HENT: Negative for congestion, dental problem, ear pain, nosebleeds, postnasal drip, rhinorrhea, sinus pressure, sneezing, sore throat, trouble swallowing and voice change.   Eyes: Negative for visual disturbance.  Respiratory: Positive for cough. Negative for choking and shortness of breath.   Cardiovascular: Negative for chest  pain and leg swelling.  Gastrointestinal: Negative for vomiting, abdominal pain and diarrhea.  Genitourinary: Negative for difficulty urinating.  Musculoskeletal: Negative for arthralgias.  Skin: Negative for rash.  Neurological: Negative for tremors, syncope and headaches.  Hematological: Does not bruise/bleed easily.       Objective:   Physical Exam  amb wf nad  Wt Readings from Last 3 Encounters:  05/02/15 137 lb (62.143 kg)  04/17/15 136 lb (61.689 kg)  04/06/15 137 lb (62.143 kg)    Vital signs reviewed   HEENT: nl dentition, turbinates, and oropharynx. Nl external ear canals without cough reflex   NECK :  without JVD/Nodes/TM/ nl carotid upstrokes bilaterally   LUNGS: no acc muscle use,  Nl contour with late exp wheeze/cough    CV:  RRR  no s3 or murmur or increase in P2, no edema   ABD:  soft and nontender with nl inspiratory excursion in the supine position. No bruits or organomegaly, bowel sounds nl  MS:  Nl gait/ ext warm without deformities, calf tenderness, cyanosis or clubbing No obvious joint restrictions   SKIN: warm and dry without lesions    NEURO:  alert, approp, nl sensorium with  no motor deficits    CXR PA and Lateral:   05/02/2015 :    I personally reviewed images and agree with radiology impression as follows:    No active cardiopulmonary disease.          Assessment & Plan:

## 2015-05-02 NOTE — Patient Instructions (Addendum)
Please remember to go to the x-ray department downstairs for your tests - we will call you with the results when they are available.  Stop advair and start dulera 100 Take 2 puffs first thing in am and then another 2 puffs about 12 hours later.   Work on inhaler technique:  relax and gently blow all the way out then take a nice smooth deep breath back in, triggering the inhaler at same time you start breathing in.  Hold for up to 5 seconds if you can. Blow out thru nose. Rinse and gargle with water when done   Only use your albuterol (as a rescue medication to be used if you can't catch your breath by resting or doing a relaxed purse lip breathing pattern.  - The less you use it, the better it will work when you need it. - Ok to use up to 2 puffs  every 4 hours if you must but call for immediate appointment if use goes up over your usual need - Don't leave home without it !!  (think of it like the spare tire for your car)    Try prilosec otc 20mg   Take 30-60 min before first meal of the day and zantac 150 mg x with supper and one  @  bedtime until return  GERD (REFLUX)  is an extremely common cause of respiratory symptoms just like yours , many times with no obvious heartburn at all.    It can be treated with medication, but also with lifestyle changes including elevation of the head of your bed (ideally with 6 inch  bed blocks),  Smoking cessation, avoidance of late meals, excessive alcohol, and avoid fatty foods, chocolate, peppermint, colas, red wine, and acidic juices such as orange juice.  NO MINT OR MENTHOL PRODUCTS SO NO COUGH DROPS  USE SUGARLESS CANDY INSTEAD (Jolley ranchers or Stover's or Life Savers) or even ice chips will also do - the key is to swallow to prevent all throat clearing. NO OIL BASED VITAMINS - use powdered substitutes.    Please schedule a follow up office visit in 4 weeks, sooner if needed

## 2015-05-02 NOTE — Assessment & Plan Note (Addendum)
DDX of  difficult airways management almost all start with A and  include Adherence, Ace Inhibitors, Acid Reflux, Active Sinus Disease, Alpha 1 Antitripsin deficiency, Anxiety masquerading as Airways dz,  ABPA,  Allergy(esp in young), Aspiration (esp in elderly), Adverse effects of meds,  Active smokers, A bunch of PE's (a small clot burden can't cause this syndrome unless there is already severe underlying pulm or vascular dz with poor reserve) plus two Bs  = Bronchiectasis and Beta blocker use..and one C= CHF  Adherence is always the initial "prime suspect" and is a multilayered concern that requires a "trust but verify" approach in every patient - starting with knowing how to use medications, especially inhalers, correctly, keeping up with refills and understanding the fundamental difference between maintenance and prns vs those medications only taken for a very short course and then stopped and not refilled.  -- The proper method of use, as well as anticipated side effects, of a metered-dose inhaler are discussed and demonstrated to the patient. Improved effectiveness after extensive coaching during this visit to a level of approximately 75 % from a baseline of 50 %    ? Acid (or non-acid) GERD > always difficult to exclude as up to 75% of pts in some series report no assoc GI/ Heartburn symptoms> rec max (24h)  acid suppression and diet restrictions/ reviewed and instructions given in writing.   ? Allergy > for now just use the singulair and add  dulera in the 100 strength as her main c/o is cough which may be exac by high dose ICS  ? Adverse effects of meds, esp DPI > try off advair   I had an extended discussion with the patient reviewing all relevant studies completed to date and  lasting 35 minutes of a 60 minute visit    Each maintenance medication was reviewed in detail including most importantly the difference between maintenance and prns and under what circumstances the prns are to be  triggered using an action plan format that is not reflected in the computer generated alphabetically organized AVS.    Please see instructions for details which were reviewed in writing and the patient given a copy highlighting the part that I personally wrote and discussed at today's ov.

## 2015-05-03 ENCOUNTER — Telehealth: Payer: Self-pay | Admitting: Hematology & Oncology

## 2015-05-03 NOTE — Telephone Encounter (Signed)
Pt's HANDICAPP sticker application is completed and ready for pick up.  Look in rx drawer

## 2015-05-04 ENCOUNTER — Encounter: Payer: Self-pay | Admitting: Hematology & Oncology

## 2015-05-07 ENCOUNTER — Ambulatory Visit: Payer: Medicare PPO

## 2015-05-07 ENCOUNTER — Other Ambulatory Visit (HOSPITAL_BASED_OUTPATIENT_CLINIC_OR_DEPARTMENT_OTHER): Payer: Medicare PPO

## 2015-05-07 ENCOUNTER — Encounter: Payer: Self-pay | Admitting: Hematology & Oncology

## 2015-05-07 ENCOUNTER — Ambulatory Visit (HOSPITAL_BASED_OUTPATIENT_CLINIC_OR_DEPARTMENT_OTHER): Payer: Medicare PPO | Admitting: Hematology & Oncology

## 2015-05-07 VITALS — BP 115/64 | HR 88 | Temp 97.7°F | Resp 16 | Ht 65.0 in | Wt 136.0 lb

## 2015-05-07 DIAGNOSIS — J45901 Unspecified asthma with (acute) exacerbation: Secondary | ICD-10-CM

## 2015-05-07 DIAGNOSIS — C649 Malignant neoplasm of unspecified kidney, except renal pelvis: Secondary | ICD-10-CM | POA: Diagnosis not present

## 2015-05-07 DIAGNOSIS — R911 Solitary pulmonary nodule: Secondary | ICD-10-CM | POA: Diagnosis not present

## 2015-05-07 DIAGNOSIS — C7972 Secondary malignant neoplasm of left adrenal gland: Secondary | ICD-10-CM | POA: Diagnosis not present

## 2015-05-07 DIAGNOSIS — C7951 Secondary malignant neoplasm of bone: Secondary | ICD-10-CM

## 2015-05-07 LAB — CMP (CANCER CENTER ONLY)
ALBUMIN: 3.6 g/dL (ref 3.3–5.5)
ALK PHOS: 55 U/L (ref 26–84)
ALT: 19 U/L (ref 10–47)
AST: 25 U/L (ref 11–38)
BUN, Bld: 25 mg/dL — ABNORMAL HIGH (ref 7–22)
CALCIUM: 8.6 mg/dL (ref 8.0–10.3)
CO2: 25 mEq/L (ref 18–33)
Chloride: 103 mEq/L (ref 98–108)
Creat: 1.5 mg/dl — ABNORMAL HIGH (ref 0.6–1.2)
GLUCOSE: 99 mg/dL (ref 73–118)
POTASSIUM: 3.2 meq/L — AB (ref 3.3–4.7)
Sodium: 137 mEq/L (ref 128–145)
Total Bilirubin: 0.7 mg/dl (ref 0.20–1.60)
Total Protein: 7.1 g/dL (ref 6.4–8.1)

## 2015-05-07 LAB — CBC WITH DIFFERENTIAL (CANCER CENTER ONLY)
BASO#: 0 10*3/uL (ref 0.0–0.2)
BASO%: 0.6 % (ref 0.0–2.0)
EOS%: 4.5 % (ref 0.0–7.0)
Eosinophils Absolute: 0.2 10*3/uL (ref 0.0–0.5)
HEMATOCRIT: 36.2 % (ref 34.8–46.6)
HEMOGLOBIN: 11.6 g/dL (ref 11.6–15.9)
LYMPH#: 1 10*3/uL (ref 0.9–3.3)
LYMPH%: 21.4 % (ref 14.0–48.0)
MCH: 28 pg (ref 26.0–34.0)
MCHC: 32 g/dL (ref 32.0–36.0)
MCV: 87 fL (ref 81–101)
MONO#: 0.5 10*3/uL (ref 0.1–0.9)
MONO%: 9.9 % (ref 0.0–13.0)
NEUT#: 3.1 10*3/uL (ref 1.5–6.5)
NEUT%: 63.6 % (ref 39.6–80.0)
Platelets: 245 10*3/uL (ref 145–400)
RBC: 4.14 10*6/uL (ref 3.70–5.32)
RDW: 18.1 % — AB (ref 11.1–15.7)
WBC: 4.9 10*3/uL (ref 3.9–10.0)

## 2015-05-07 LAB — LACTATE DEHYDROGENASE: LDH: 203 U/L (ref 125–245)

## 2015-05-07 MED ORDER — AXITINIB 5 MG PO TABS: 5.0000 mg | ORAL_TABLET | Freq: Two times a day (BID) | ORAL | Status: DC

## 2015-05-07 NOTE — Progress Notes (Signed)
Hematology and Oncology Follow Up Visit  Cathy Lewis [id number] October 09, 1945 70 y.o. 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 SLebronis here today for a follow-up.  Unfortunately, her disease is progressing. We did scans on her last week. She has more pulmonary nodules. The largest one is 7 mm. She has an enlarging left adrenal met the measures 2.1 x 1.6 cm. Thankfully, she has stable bony disease.  Because of this, I think we have to stop the Opdivo and move over to another therapy. With the NCCN guidelines, it is recommended that Inlyta be tried as third line therapy. She already has had Votrient.  Where she had the radiation therapy for her shoulder and back, this is doing better.  Her appetite is pretty good. She's had no nausea or vomiting. She's had no diarrhea. She has had no rashes. She has had no leg swelling. She has had no headache.   Overall, her performance status is Cathy Lewis 1.    Medications:    Medication List       This list is accurate as of: 05/07/15  1:34 PM.  Always use your most recent med list.               acetaminophen 500 MG tablet  Commonly known as:  TYLENOL  Take 1,000 mg by mouth every 6 (six) hours as needed. Pain     albuterol 90 MCG/ACT inhaler  Commonly known as:  PROVENTIL,VENTOLIN  Inhale 2 puffs into the lungs every 4 (four) hours as needed. Wheezing     amLODipine 5 MG tablet  Commonly known as:  NORVASC  Take 5 mg by mouth daily.     axitinib 5 MG tablet  Commonly known as:  INLYTA  Take 1 tablet (5 mg total) by mouth 2 (two) times daily.     CALCIUM + D PO  Take 1 tablet by mouth daily.     dicyclomine 20 MG tablet  Commonly  known as:  BENTYL  Take 20 mg by mouth 3 (three) times daily as needed (Stomach Cramps).     FLONASE 50 MCG/ACT nasal spray  Generic drug:  fluticasone  Place 1 spray into the nose 2 (two) times daily as needed. Allergies     hydrochlorothiazide 25 MG tablet  Commonly known as:  HYDRODIURIL  Take 25 mg by mouth daily.     HYDROcodone-acetaminophen 5-325 MG tablet  Commonly known as:  NORCO/VICODIN  Take 1-2 tablets by mouth every 4 (four) hours as needed for moderate pain or severe pain.     Magnesium 250 MG Tabs  Take 250 mg by mouth daily.     meclizine 25 MG tablet  Commonly known as:  ANTIVERT  Take 12.5 mg by mouth 3 (three) times daily as needed. For dizziness.     mometasone-formoterol 100-5 MCG/ACT Aero  Commonly known as:  DULERA  Take 2 puffs first thing in am and then another 2 puffs about 12 hours later.     montelukast 10 MG tablet  Commonly known as:  SINGULAIR  Take 10 mg by mouth at bedtime.     omeprazole 20 MG capsule  Commonly known as:  PRILOSEC  Take 1 capsule (20 mg total) by mouth daily.     ondansetron 4 MG tablet  Commonly known as:  ZOFRAN  Take 1 tablet (4 mg total) by mouth every 6 (six) hours as needed for nausea or vomiting.     OPDIVO IV  as directed.     OSTEO BI-FLEX ADV DOUBLE ST PO  Take 1 tablet by mouth daily.     PRESCRIPTION MEDICATION  every 14 (fourteen) days. Pt goes to Dr. Antonieta Pert office at [facility] [place] every 14 days to get treatment.     PROBIOTIC DAILY PO  Take by mouth.     ranitidine 150 MG tablet  Commonly known as:  ZANTAC  Take 150 mg by mouth 2 (two) times daily.     trolamine salicylate 10 % cream  Commonly known as:  ASPERCREME  Apply 1 application topically 2 (two) times daily as needed for muscle pain.     vitamin C 500 MG tablet  Commonly known as:  ASCORBIC ACID  Take 500 mg by mouth daily.     XGEVA 120 MG/1.7ML Soln injection  Generic drug:  denosumab  Inject 120 mg into the skin every  30 (thirty) days.        Allergies:  Allergies  Allergen Reactions  . Azithromycin     Stomach cramps. Green black stool   . Gnp [Acetaminophen]     "GNR" (cough syrup) "GNR" (cough syrup)   . Rofecoxib     Pt unsure   . Tramadol Nausea And Vomiting and Other (See Comments)    dizziness   . Avelox [Moxifloxacin Hcl In Nacl] Palpitations and Other (See Comments)    Body ache   . Etodolac Palpitations and Other (See Comments)    Joints hurt Joints hurt  . Moxifloxacin Other (See Comments) and Palpitations    Body ache    Past Medical History, Surgical history, Social history, and Family History were reviewed and updated.  Review of Systems: All other 10 point review of systems is negative.   Physical Exam:  height is '5\' 5"'  (1.651 m) and weight is 136 lb (61.689 kg). Her oral temperature is 97.7 F (36.5 C). Her blood pressure is 115/64 and her pulse is 88. Her respiration is 16.   Wt Readings from Last 3 Encounters:  05/07/15 136 lb (61.689 kg)  05/02/15 137 lb (62.143 kg)  04/17/15 136 lb (61.689 kg)    Well-developed well-nourished white female in no obvious distress. Head exam shows no ocular or oral lesions. There are no palpable cervical or supraclavicular lymph nodes. Lungs are clear. She has some slight wheezing in the central portion of the lungs. She has no rhonchi. Cardiac exam regular rate and rhythm with no murmurs, rubs or bruits. Abdomen is soft. She has the healing hernia incision in the right lower quadrant. There is no erythema surrounding this. There is no palpable fluid wave. There is no palpable abdominal mass. There is no palpable liver or spleen tip. Extremities shows no clubbing, cyanosis or edema. Neurological exam shows no focal neurological deficits. Skin exam shows no rashes, ecchymoses or petechia.  Lab Results  Component Value Date   WBC 4.9 05/07/2015   HGB 11.6 05/07/2015   HCT 36.2 05/07/2015   MCV 87 05/07/2015   PLT 245 05/07/2015    No results found for: FERRITIN, IRON, TIBC, UIBC, IRONPCTSAT Lab Results  Component Value Date   RBC 4.14 05/07/2015   No results found for:  KPAFRELGTCHN, LAMBDASER, KAPLAMBRATIO No results found for: IGGSERUM, IGA, IGMSERUM No results found for: Odetta Pink, SPEI   Chemistry      Component Value Date/Time   NA 137 05/07/2015 1110   NA 139 04/05/2015 1030   K 3.2* 05/07/2015 1110   K 3.6 04/05/2015 1030   CL 103 05/07/2015 1110   CL 102 04/05/2015 1030   CO2 25 05/07/2015 1110   CO2 27 04/05/2015 1030   BUN 25* 05/07/2015 1110   BUN 23* 04/05/2015 1030   CREATININE 1.5* 05/07/2015 1110   CREATININE 1.29* 04/05/2015 1030   GLU 104 07/20/2008      Component Value Date/Time   CALCIUM 8.6 05/07/2015 1110   CALCIUM 9.4 04/05/2015 1030   ALKPHOS 55 05/07/2015 1110   ALKPHOS 54 04/05/2015 1030   AST 25 05/07/2015 1110   AST 23 04/05/2015 1030   ALT 19 05/07/2015 1110   ALT 18 04/05/2015 1030   BILITOT 0.70 05/07/2015 1110   BILITOT 0.5 04/05/2015 1030     Impression and Plan: Ms. Barno is a very pleasant 70 yo white female with metastatic clear cell renal carcinoma diagnosed in 2013. She completed palliative radiation to the spine and left scapula and is feeling much better. She is asymptomatic at this time.   Again, we had to make a change. I think Inlyta would be reasonable. 5 mg by mouth twice a day would be the standard dose. I gave her a bottle for the first month. We will see how much it will cost her.  I went over some of the side effects with her. I told her about the possibility of fatigue, skin rash, diarrhea, fatigue, cough. Her blood pressure may also go up a little bit. She understands all this.  We will continue her on the Xgeva. I this is important for her.  We will get her back in 2-3 weeks so we can follow-up with her labs to see how she's tolerating the Inlyta.Volanda Napoleon, MD 3/13/20171:34  PM

## 2015-05-07 NOTE — Progress Notes (Signed)
No treatment today per Dr Ennever 

## 2015-05-08 ENCOUNTER — Ambulatory Visit: Payer: Medicare PPO

## 2015-05-08 ENCOUNTER — Ambulatory Visit: Payer: Medicare PPO | Admitting: Family

## 2015-05-08 ENCOUNTER — Other Ambulatory Visit: Payer: Medicare PPO

## 2015-05-08 ENCOUNTER — Telehealth: Payer: Self-pay | Admitting: Internal Medicine

## 2015-05-08 NOTE — Telephone Encounter (Signed)
Spoke with pt. She wanted to let Dr Melvyn Novas know that she had a f/u chest ct done last week per Dr Marin Olp and it showed that she had lung mets.  She was concerned that her cxr prior had showed normal.  I explained to pt that Ct scans show a much detailed picture of the lung than a normal cxr.  Pt verbalized understanding of this.  Will forward to Dr Melvyn Novas for any further recommendations.

## 2015-05-10 ENCOUNTER — Institutional Professional Consult (permissible substitution): Payer: Medicare PPO | Admitting: Internal Medicine

## 2015-05-11 NOTE — Telephone Encounter (Signed)
This is correct, nothing further to add - ok to close note

## 2015-05-11 NOTE — Telephone Encounter (Signed)
MW please advise

## 2015-05-21 ENCOUNTER — Other Ambulatory Visit: Payer: Medicare PPO

## 2015-05-21 ENCOUNTER — Ambulatory Visit: Payer: Medicare PPO

## 2015-05-21 ENCOUNTER — Ambulatory Visit: Payer: Medicare PPO | Admitting: Hematology & Oncology

## 2015-05-22 ENCOUNTER — Ambulatory Visit: Payer: Medicare PPO | Admitting: Hematology & Oncology

## 2015-05-22 ENCOUNTER — Ambulatory Visit: Payer: Medicare PPO

## 2015-05-22 ENCOUNTER — Other Ambulatory Visit: Payer: Medicare PPO

## 2015-05-23 ENCOUNTER — Other Ambulatory Visit: Payer: Self-pay | Admitting: Radiation Oncology

## 2015-05-23 ENCOUNTER — Telehealth: Payer: Self-pay | Admitting: Internal Medicine

## 2015-05-23 DIAGNOSIS — C7951 Secondary malignant neoplasm of bone: Secondary | ICD-10-CM

## 2015-05-23 MED FILL — *INLYTA 5 MG TABLET: 5 | 30 days supply | Qty: 60 | Fill #0

## 2015-05-23 NOTE — Telephone Encounter (Signed)
Spoke with pt. States that Ruthe Mannan is not working well as Advair. Reports increased SOB. Denies chest tightness, wheezing, coughing. Would like MW's recommendations on what to do from here. MW - please advise. Thanks.   Patient Instructions     Please remember to go to the x-ray department downstairs for your tests - we will call you with the results when they are available.  Stop advair and start dulera 100 Take 2 puffs first thing in am and then another 2 puffs about 12 hours later.   Work on inhaler technique: relax and gently blow all the way out then take a nice smooth deep breath back in, triggering the inhaler at same time you start breathing in. Hold for up to 5 seconds if you can. Blow out thru nose. Rinse and gargle with water when done   Only use your albuterol (as a rescue medication to be used if you can't catch your breath by resting or doing a relaxed purse lip breathing pattern.  - The less you use it, the better it will work when you need it. - Ok to use up to 2 puffs every 4 hours if you must but call for immediate appointment if use goes up over your usual need - Don't leave home without it !! (think of it like the spare tire for your car)    Try prilosec otc 20mg  Take 30-60 min before first meal of the day and zantac 150 mg x with supper and one @ bedtime until return  GERD (REFLUX) is an extremely common cause of respiratory symptoms just like yours , many times with no obvious heartburn at all.   It can be treated with medication, but also with lifestyle changes including elevation of the head of your bed (ideally with 6 inch bed blocks), Smoking cessation, avoidance of late meals, excessive alcohol, and avoid fatty foods, chocolate, peppermint, colas, red wine, and acidic juices such as orange juice.  NO MINT OR MENTHOL PRODUCTS SO NO COUGH DROPS  USE SUGARLESS CANDY INSTEAD (Jolley ranchers or Stover's or Life Savers) or even ice chips will also do -  the key is to swallow to prevent all throat clearing. NO OIL BASED VITAMINS - use powdered substitutes.   Please schedule a follow up office visit in 4 weeks, sooner if needed

## 2015-05-23 NOTE — Telephone Encounter (Signed)
This is probably more related to the technique than the inhaler as dulera is a much better medication but I'm fine with going back to the advair for now  rememeber to use the rescue prn as per instructions

## 2015-05-23 NOTE — Telephone Encounter (Signed)
Patient notified of Dr. Wert's recommendations. Nothing further needed.  

## 2015-05-24 ENCOUNTER — Other Ambulatory Visit (HOSPITAL_COMMUNITY): Payer: Self-pay | Admitting: Interventional Radiology

## 2015-05-24 ENCOUNTER — Ambulatory Visit
Admission: RE | Admit: 2015-05-24 | Discharge: 2015-05-24 | Disposition: A | Discharge status: Home / Self Care | Payer: Medicare PPO | Source: Ambulatory Visit | Attending: Radiation Oncology | Admitting: Radiation Oncology

## 2015-05-24 DIAGNOSIS — C7951 Secondary malignant neoplasm of bone: Secondary | ICD-10-CM

## 2015-05-24 DIAGNOSIS — S22000B Wedge compression fracture of unspecified thoracic vertebra, initial encounter for open fracture: Secondary | ICD-10-CM

## 2015-05-24 HISTORY — PX: IR GENERIC HISTORICAL: IMG1180011

## 2015-05-24 NOTE — Consult Note (Signed)
Chief Complaint: Patient was seen in consultation today for  Chief Complaint  Patient presents with  . Advice Only    Evaluate back pain for possible Osteo-Cool   at the request of Pacholke,Heather  Referring Physician(s): Pacholke,Heather  Supervising Physician: Marybelle Killings  History of Present Illness: Cathy Lewis is a 70 y.o. female diagnosed with renal cell carcinoma in 2013 who has had a two-week history of increasing upper lumbar pain. She had a left nephrectomy in that year and also underwent radiation therapy for a T12 vertebral body lesion. She had additional radiation in her lumbar region in 2016. These were both successful. She recently underwent radiation therapy for a left scapular region which was successful. Her current lumbar pain is recurrent and similar to previously in character but is more severe. She denies weakness but does use a walker because of instability. It is affecting her appetite. She is on narcotic analgesia with some relief but wishes further treatment.  Past Medical History  Diagnosis Date  . Atypical chest pain     NORMAL CARDIOLITE STUDY   . Dyslipidemia   . Hypertension   . Syncope     HISTORY OF  . Asthma   . Seasonal allergies   . GERD (gastroesophageal reflux disease)   . Arthritis     arthritis in knees and back   . Complication of anesthesia     hard to wake up from umb hernia-was given alot of meds  . 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 Appendectomy    . Carpal tunnel release      R  . Other surgical history      bladder vault prolapse  . Other surgical history      right foot bunion and hammer toe surgery   . Other surgical history      right knee arthroscopic   . Other surgical history      right arm ( underarm ) surgery removed ? cyst   . Dilation and curettage of uterus    . Tubal ligation      1977  . Total knee arthroplasty  03/11/2011     Procedure: TOTAL KNEE ARTHROPLASTY;  Surgeon: Cynda Familia, MD;  Location: [facility];  Service: Orthopedics;  Laterality: Right;  . Abdominal hysterectomy  2004    rectocele  . Cystocele repair    . Colonoscopy    . Hernia repair  2014    umb  . 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 Inguinal hernia repair Left 01/03/2014    Procedure: LEFT INGUINAL HERNIA REPAIR ;  Surgeon: [name], MD;  Location: [place];  Service: General;  Laterality: Left;  . Insertion of mesh Left 01/03/2014    Procedure: INSERTION OF MESH;  Surgeon: [name], MD;  Location: [place];  Service: General;  Laterality: Left;  . Joint replacement    . Inguinal hernia repair Right 04/06/2015    Procedure: HERNIA REPAIR RIGHT  INGUINAL ADULT WITH MESH;  Surgeon: [name], MD;  Location: [facility];  Service: General;  Laterality: Right;  . Insertion of mesh Right 04/06/2015    Procedure: INSERTION OF MESH;  Surgeon: [name], MD;  Location: [facility];  Service: General;  Laterality: Right;    Allergies: Azithromycin; Gnp; Rofecoxib; Tramadol; Avelox; Etodolac; and Moxifloxacin  Medications: Prior to Admission medications  Medication Sig Start Date End Date Taking? Authorizing Provider  albuterol (PROVENTIL,VENTOLIN) 90 MCG/ACT inhaler Inhale 2 puffs into the lungs every 4 (four) hours as needed. Wheezing    Yes Historical Provider, MD  amLODipine (NORVASC) 5 MG tablet Take 5 mg by mouth daily.   Yes Historical Provider, MD  axitinib (INLYTA) 5 MG tablet Take 1 tablet (5 mg total) by mouth 2 (two) times daily. 05/07/15  Yes Volanda Napoleon, MD  Calcium Carbonate-Vitamin D (CALCIUM + D PO) Take 1 tablet by mouth daily.    Yes Historical Provider, MD  dicyclomine (BENTYL) 20 MG tablet Take 20 mg by mouth 3 (three) times daily as needed (Stomach Cramps).   Yes Historical Provider, MD  fluticasone (FLONASE) 50 MCG/ACT nasal spray Place 1 spray into the  nose 2 (two) times daily as needed. Allergies    Yes Historical Provider, MD  hydrochlorothiazide (HYDRODIURIL) 25 MG tablet Take 25 mg by mouth daily.   Yes Historical Provider, MD  HYDROcodone-acetaminophen (NORCO/VICODIN) 5-325 MG tablet Take 1-2 tablets by mouth every 4 (four) hours as needed for moderate pain or severe pain. 04/06/15  Yes [name], MD  Magnesium 250 MG TABS Take 250 mg by mouth daily.   Yes Historical Provider, MD  meclizine (ANTIVERT) 25 MG tablet Take 12.5 mg by mouth 3 (three) times daily as needed. For dizziness.   Yes Historical Provider, MD  Misc Natural Products (OSTEO BI-FLEX ADV DOUBLE ST PO) Take 1 tablet by mouth daily.   Yes Historical Provider, MD  mometasone-formoterol (DULERA) 100-5 MCG/ACT AERO Take 2 puffs first thing in am and then another 2 puffs about 12 hours later. 05/02/15  Yes Tanda Rockers, MD  montelukast (SINGULAIR) 10 MG tablet Take 10 mg by mouth at bedtime.    Yes Historical Provider, MD  omeprazole (PRILOSEC) 20 MG capsule Take 1 capsule (20 mg total) by mouth daily. 05/02/15  Yes Tanda Rockers, MD  ondansetron (ZOFRAN) 4 MG tablet Take 1 tablet (4 mg total) by mouth every 6 (six) hours as needed for nausea or vomiting. 01/03/14  Yes [name], MD  Probiotic Product (PROBIOTIC DAILY PO) Take by mouth.   Yes Historical Provider, MD  ranitidine (ZANTAC) 150 MG tablet Take 150 mg by mouth 2 (two) times daily.   Yes Historical Provider, MD  trolamine salicylate (ASPERCREME) 10 % cream Apply 1 application topically 2 (two) times daily as needed for muscle pain.   Yes Historical Provider, MD  vitamin C (ASCORBIC ACID) 500 MG tablet Take 500 mg by mouth daily.   Yes Historical Provider, MD  acetaminophen (TYLENOL) 500 MG tablet Take 1,000 mg by mouth every 6 (six) hours as needed. Reported on 05/24/2015    Historical Provider, MD  denosumab (XGEVA) 120 MG/1.7ML SOLN injection Inject 120 mg into the skin every 30 (thirty) days.    Historical  Provider, MD  Nivolumab (OPDIVO IV) as directed. Reported on 05/24/2015    Historical Provider, MD  PRESCRIPTION MEDICATION every 14 (fourteen) days. Reported on 05/24/2015    Historical Provider, MD     Family History  Problem Relation Age of Onset  . Heart attack Father     DECEASED  . Cancer Mother     DECEASED-unsure of type  . Cancer Brother     LUNG CA- smoked  . Aneurysm Brother     AAA  . Asthma Sister     Social History   Social History  . Marital Status: Married  Spouse Name: N/A  . Number of Children: N/A  . Years of Education: N/A   Social History Main Topics  . Smoking status: Former Smoker -- 0.25 packs/day for 10 years    Quit date: 02/24/1985  . Smokeless tobacco: Not on file  . Alcohol Use: No  . Drug Use: No  . Sexual Activity: Not on file   Other Topics Concern  . Not on file   Social History Narrative    ECOG Status: 2 - Symptomatic, <50% confined to bed  Review of Systems: A 12 point ROS discussed and pertinent positives are indicated in the HPI above.  All other systems are negative.  Review of Systems  Vital Signs: BP 116/75 mmHg  Pulse 89  Temp(Src) 98 F (36.7 C) (Oral)  Resp 15  Ht 5\' 5"  (1.651 m)  Wt 136 lb (61.689 kg)  BMI 22.63 kg/m2  SpO2 93%  Physical Exam  Mallampati Score:   3  Imaging: Dg Chest 2 View  05/02/2015  CLINICAL DATA:  Intermittent cough for 2 months, asthma EXAM: CHEST  2 VIEW COMPARISON:  Chest x-ray of 12/15/2014 and 12/01/2014 FINDINGS: No active infiltrate or effusion is seen. Mediastinal and hilar contours are unremarkable. The heart is within upper limits normal. No acute bony abnormality seen. Mild thoracolumbar spine curvature is again noted. IMPRESSION: No active cardiopulmonary disease. Electronically Signed   By: Ivar Drape M.D.   On: 05/02/2015 12:21   CT scan performed 05/04/2015 demonstrates multiple lytic bone lesions. There is a destructive lesion in T12 with slight vertebral body  collapse. Tumor does extend beyond the cortex. Epidural tumor is difficult to evaluate on CT. There are also lesions in T10 and L2. A subsequent bone scan demonstrates activity at these locations. There is   Labs:  CBC:  Recent Labs  04/03/15 0943 04/05/15 1030 04/17/15 0913 05/07/15 1109  WBC 4.8 5.0 7.4 4.9  HGB 13.0 12.7 11.7 11.6  HCT 40.6 40.3 36.8 36.2  PLT 184 204 304 245    COAGS: No results for input(s): INR, APTT in the last 8760 hours.  BMP:  Recent Labs  12/01/14 2017  04/03/15 0942 04/05/15 1030 04/17/15 0913 05/07/15 1110  NA 133*  < > 137 139 135 137  K 3.7  < > 3.5 3.6 4.0 3.2*  CL 101  < > 101 102 100 103  CO2 26  < > 28 27 30 25   GLUCOSE 108*  < > 101 98 82 99  BUN 21*  < > 22 23* 22 25*  CALCIUM 9.1  < > 9.2 9.4 9.5 8.6  CREATININE 1.44*  < > 1.3* 1.29* 1.5* 1.5*  GFRNONAA 36*  --   --  41*  --   --   GFRAA 41*  --   --  47*  --   --   < > = values in this interval not displayed.  LIVER FUNCTION TESTS:  Recent Labs  04/03/15 0942 04/05/15 1030 04/17/15 0913 05/07/15 1110  BILITOT 0.60 0.5 0.50 0.70  AST 24 23 24 25   ALT 23 18 18 19   ALKPHOS 51 54 56 55  PROT 7.4 7.3 7.2 7.1  ALBUMIN 3.7 4.4 3.3 3.6    TUMOR MARKERS:  Recent Labs  11/20/14 1254  CEA 1.1    Assessment and Plan:  Mrs. 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 She has undergone 2 courses of radiation  therapy since 2013 and she is referred for osteo-ablation and kyphoplasty at this level. She will require an MRI to determine if there is cord compression from epidural tumor. If her spinal canal is sufficiently patent, she will be a candidate for subsequent treatment  Thank you for this interesting consult.  I greatly enjoyed meeting BRIEONNA SENNETT and look forward to participating in their care.  A copy of this report was sent to the requesting provider on this date.  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

## 2015-05-28 ENCOUNTER — Telehealth: Payer: Self-pay | Admitting: *Deleted

## 2015-05-28 ENCOUNTER — Other Ambulatory Visit: Payer: Self-pay | Admitting: *Deleted

## 2015-05-28 ENCOUNTER — Ambulatory Visit (HOSPITAL_COMMUNITY): Payer: Medicare PPO

## 2015-05-28 DIAGNOSIS — C7951 Secondary malignant neoplasm of bone: Principal | ICD-10-CM

## 2015-05-28 DIAGNOSIS — C649 Malignant neoplasm of unspecified kidney, except renal pelvis: Secondary | ICD-10-CM

## 2015-05-28 NOTE — Telephone Encounter (Signed)
Patient wanting Dr Marin Olp to place a referral to a urologist.  She has been seen and worked up by her PCP who wants her to see a urologist and made a referral. She states she's had frequency and burning for 2 weeks. She was treated with no improvement with antibiotics. She is unhappy with the communication her PCP is experiencing regarding the referral and wants Dr Marin Olp to do it.  It was explained to patient that her PCP needs to be the one to complete the referral as he is the PCP and his office was the one who has worked up and treated the urinary issue. She understood.  All reviewed with Dr Marin Olp. UA C&S added to tomorrow's lab appointment for general review per Dr Marin Olp.

## 2015-05-29 ENCOUNTER — Encounter: Payer: Self-pay | Admitting: Hematology & Oncology

## 2015-05-29 ENCOUNTER — Ambulatory Visit (HOSPITAL_BASED_OUTPATIENT_CLINIC_OR_DEPARTMENT_OTHER)
Admission: RE | Admit: 2015-05-29 | Discharge: 2015-05-29 | Disposition: A | Discharge status: Home / Self Care | Payer: Medicare PPO | Source: Ambulatory Visit | Attending: Hematology & Oncology | Admitting: Hematology & Oncology

## 2015-05-29 ENCOUNTER — Ambulatory Visit (HOSPITAL_BASED_OUTPATIENT_CLINIC_OR_DEPARTMENT_OTHER): Payer: Medicare PPO

## 2015-05-29 ENCOUNTER — Other Ambulatory Visit (HOSPITAL_BASED_OUTPATIENT_CLINIC_OR_DEPARTMENT_OTHER): Payer: Medicare PPO

## 2015-05-29 ENCOUNTER — Ambulatory Visit (HOSPITAL_BASED_OUTPATIENT_CLINIC_OR_DEPARTMENT_OTHER): Payer: Medicare PPO | Admitting: Hematology & Oncology

## 2015-05-29 VITALS — BP 113/80 | HR 88 | Temp 97.2°F | Resp 14 | Ht 65.0 in | Wt 132.0 lb

## 2015-05-29 DIAGNOSIS — M545 Low back pain: Secondary | ICD-10-CM | POA: Diagnosis present

## 2015-05-29 DIAGNOSIS — C649 Malignant neoplasm of unspecified kidney, except renal pelvis: Secondary | ICD-10-CM

## 2015-05-29 DIAGNOSIS — R911 Solitary pulmonary nodule: Secondary | ICD-10-CM

## 2015-05-29 DIAGNOSIS — C7972 Secondary malignant neoplasm of left adrenal gland: Secondary | ICD-10-CM | POA: Diagnosis not present

## 2015-05-29 DIAGNOSIS — C7951 Secondary malignant neoplasm of bone: Secondary | ICD-10-CM | POA: Diagnosis not present

## 2015-05-29 DIAGNOSIS — M4186 Other forms of scoliosis, lumbar region: Secondary | ICD-10-CM | POA: Insufficient documentation

## 2015-05-29 DIAGNOSIS — N390 Urinary tract infection, site not specified: Secondary | ICD-10-CM

## 2015-05-29 DIAGNOSIS — M4854XA Collapsed vertebra, not elsewhere classified, thoracic region, initial encounter for fracture: Secondary | ICD-10-CM

## 2015-05-29 LAB — COMPREHENSIVE METABOLIC PANEL
ALT: 18 U/L (ref 0–55)
ANION GAP: 11 meq/L (ref 3–11)
AST: 24 U/L (ref 5–34)
Albumin: 4 g/dL (ref 3.5–5.0)
Alkaline Phosphatase: 80 U/L (ref 40–150)
BILIRUBIN TOTAL: 0.76 mg/dL (ref 0.20–1.20)
BUN: 26.7 mg/dL — ABNORMAL HIGH (ref 7.0–26.0)
CO2: 29 meq/L (ref 22–29)
Calcium: 10.6 mg/dL — ABNORMAL HIGH (ref 8.4–10.4)
Chloride: 100 mEq/L (ref 98–109)
Creatinine: 1.8 mg/dL — ABNORMAL HIGH (ref 0.6–1.1)
EGFR: 28 mL/min/{1.73_m2} — AB (ref >90)
Glucose: 133 mg/dl (ref 70–140)
POTASSIUM: 3.5 meq/L (ref 3.5–5.1)
Sodium: 141 mEq/L (ref 136–145)
TOTAL PROTEIN: 7.9 g/dL (ref 6.4–8.3)

## 2015-05-29 LAB — CBC WITH DIFFERENTIAL (CANCER CENTER ONLY)
BASO#: 0.1 10*3/uL (ref 0.0–0.2)
BASO%: 0.9 % (ref 0.0–2.0)
EOS%: 2.5 % (ref 0.0–7.0)
Eosinophils Absolute: 0.1 10*3/uL (ref 0.0–0.5)
HCT: 45.7 % (ref 34.8–46.6)
HEMOGLOBIN: 15.4 g/dL (ref 11.6–15.9)
LYMPH#: 1.6 10*3/uL (ref 0.9–3.3)
LYMPH%: 28.3 % (ref 14.0–48.0)
MCH: 27.7 pg (ref 26.0–34.0)
MCHC: 33.7 g/dL (ref 32.0–36.0)
MCV: 82 fL (ref 81–101)
MONO#: 0.4 10*3/uL (ref 0.1–0.9)
MONO%: 7.7 % (ref 0.0–13.0)
NEUT%: 60.6 % (ref 39.6–80.0)
NEUTROS ABS: 3.4 10*3/uL (ref 1.5–6.5)
PLATELETS: 205 10*3/uL (ref 145–400)
RBC: 5.56 10*6/uL — ABNORMAL HIGH (ref 3.70–5.32)
RDW: 17.2 % — AB (ref 11.1–15.7)
WBC: 5.6 10*3/uL (ref 3.9–10.0)

## 2015-05-29 LAB — LACTATE DEHYDROGENASE: LDH: 201 U/L (ref 125–245)

## 2015-05-29 MED ORDER — DENOSUMAB 120 MG/1.7ML ~~LOC~~ SOLN: 120.0000 mg | Freq: Once | SUBCUTANEOUS | Status: DC

## 2015-05-29 MED ORDER — DENOSUMAB 120 MG/1.7ML ~~LOC~~ SOLN
120.0000 mg | Freq: Once | SUBCUTANEOUS | Status: AC
Start: 2015-05-29 — End: 2015-05-29
  Administered 2015-05-29: 120 mg via SUBCUTANEOUS
  Filled 2015-05-29: qty 1.7

## 2015-05-29 NOTE — Patient Instructions (Signed)
Denosumab injection  What is this medicine?  DENOSUMAB (den oh sue mab) slows bone breakdown. Prolia is used to treat osteoporosis in women after menopause and in men. Xgeva is used to prevent bone fractures and other bone problems caused by cancer bone metastases. Xgeva is also used to treat giant cell tumor of the bone.  This medicine may be used for other purposes; ask your health care provider or pharmacist if you have questions.  What should I tell my health care provider before I take this medicine?  They need to know if you have any of these conditions:  -dental disease  -eczema  -infection or history of infections  -kidney disease or on dialysis  -low blood calcium or vitamin D  -malabsorption syndrome  -scheduled to have surgery or tooth extraction  -taking medicine that contains denosumab  -thyroid or parathyroid disease  -an unusual reaction to denosumab, other medicines, foods, dyes, or preservatives  -pregnant or trying to get pregnant  -breast-feeding  How should I use this medicine?  This medicine is for injection under the skin. It is given by a health care professional in a hospital or clinic setting.  If you are getting Prolia, a special MedGuide will be given to you by the pharmacist with each prescription and refill. Be sure to read this information carefully each time.  For Prolia, talk to your pediatrician regarding the use of this medicine in children. Special care may be needed. For Xgeva, talk to your pediatrician regarding the use of this medicine in children. While this drug may be prescribed for children as young as 13 years for selected conditions, precautions do apply.  Overdosage: If you think you have taken too much of this medicine contact a poison control center or emergency room at once.  NOTE: This medicine is only for you. Do not share this medicine with others.  What if I miss a dose?  It is important not to miss your dose. Call your doctor or health care professional if you are  unable to keep an appointment.  What may interact with this medicine?  Do not take this medicine with any of the following medications:  -other medicines containing denosumab  This medicine may also interact with the following medications:  -medicines that suppress the immune system  -medicines that treat cancer  -steroid medicines like prednisone or cortisone  This list may not describe all possible interactions. Give your health care provider a list of all the medicines, herbs, non-prescription drugs, or dietary supplements you use. Also tell them if you smoke, drink alcohol, or use illegal drugs. Some items may interact with your medicine.  What should I watch for while using this medicine?  Visit your doctor or health care professional for regular checks on your progress. Your doctor or health care professional may order blood tests and other tests to see how you are doing.  Call your doctor or health care professional if you get a cold or other infection while receiving this medicine. Do not treat yourself. This medicine may decrease your body's ability to fight infection.  You should make sure you get enough calcium and vitamin D while you are taking this medicine, unless your doctor tells you not to. Discuss the foods you eat and the vitamins you take with your health care professional.  See your dentist regularly. Brush and floss your teeth as directed. Before you have any dental work done, tell your dentist you are receiving this medicine.  Do   not become pregnant while taking this medicine or for 5 months after stopping it. Women should inform their doctor if they wish to become pregnant or think they might be pregnant. There is a potential for serious side effects to an unborn child. Talk to your health care professional or pharmacist for more information.  What side effects may I notice from receiving this medicine?  Side effects that you should report to your doctor or health care professional as soon as  possible:  -allergic reactions like skin rash, itching or hives, swelling of the face, lips, or tongue  -breathing problems  -chest pain  -fast, irregular heartbeat  -feeling faint or lightheaded, falls  -fever, chills, or any other sign of infection  -muscle spasms, tightening, or twitches  -numbness or tingling  -skin blisters or bumps, or is dry, peels, or red  -slow healing or unexplained pain in the mouth or jaw  -unusual bleeding or bruising  Side effects that usually do not require medical attention (Report these to your doctor or health care professional if they continue or are bothersome.):  -muscle pain  -stomach upset, gas  This list may not describe all possible side effects. Call your doctor for medical advice about side effects. You may report side effects to FDA at 1-800-FDA-1088.  Where should I keep my medicine?  This medicine is only given in a clinic, doctor's office, or other health care setting and will not be stored at home.  NOTE: This sheet is a summary. It may not cover all possible information. If you have questions about this medicine, talk to your doctor, pharmacist, or health care provider.      2016, [employer] Standard. (2011-08-11 12:37:47)

## 2015-05-30 ENCOUNTER — Other Ambulatory Visit: Payer: Self-pay | Admitting: Radiation Oncology

## 2015-05-30 DIAGNOSIS — S22000B Wedge compression fracture of unspecified thoracic vertebra, initial encounter for open fracture: Secondary | ICD-10-CM

## 2015-05-30 MED ORDER — SULFAMETHOXAZOLE-TRIMETHOPRIM 800-160 MG PO TABS: 1.0000 | ORAL_TABLET | Freq: Two times a day (BID) | ORAL | Status: DC

## 2015-05-30 NOTE — Progress Notes (Signed)
Hematology and Oncology Follow Up Visit  Cathy Lewis [id number] 1946/02/23 70 y.o. 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  She comes in a wheelchair. She's having more pain in the lower back. We did go ahead and get some plain films. 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 I would had to think that this is osteoporotic. There is no obvious pathologic fracture or obvious metastatic disease.  She says she is to have a MRI this weekend. She can only have an open MRI.  She is on Inlyta. She's been on this now for about 3 weeks. She seems to be tolerating this okay.  She has had a little bit of dyspepsia. She is on Zantac. I'm unsure whether she also taking Prilosec.  She's had constant dysuria. I will see about calling in some antibiotic for her. She apparently has been taking Cipro. I'm unsure if she is allergic to this or not.  She's had no obvious fever. She's had no hematuria.  Her appetite is down a little bit because of the pain.  She's had no rashes. She's had no headache. He's had no cough.   Overall, her performance status is ECOG 2.    Medications:    Medication List       This list is accurate as of: 05/29/15 11:59 PM.  Always use your most recent med list.               acetaminophen 500 MG tablet  Commonly known as:  TYLENOL  Take 1,000 mg by mouth every 6 (six) hours as needed. Reported on 05/24/2015     albuterol 90 MCG/ACT inhaler  Commonly known as:  PROVENTIL,VENTOLIN  Inhale 2 puffs into the lungs every 4 (four) hours as needed. Wheezing     amLODipine 5 MG tablet  Commonly known as:  NORVASC   Take 5 mg by mouth daily.     axitinib 5 MG tablet  Commonly known as:  INLYTA  Take 1 tablet (5 mg total) by mouth 2 (two) times daily.     CALCIUM + D PO  Take 1 tablet by mouth daily.     dicyclomine 20 MG tablet  Commonly known as:  BENTYL  Take 20 mg by mouth 3 (three) times daily as needed (Stomach Cramps).     hydrochlorothiazide 25 MG tablet  Commonly known as:  HYDRODIURIL  Take 25 mg by mouth daily.     HYDROcodone-acetaminophen 5-325 MG tablet  Commonly known as:  NORCO/VICODIN  Take 1-2 tablets by mouth every 4 (four) hours as needed for moderate pain or severe pain.     Magnesium 250 MG Tabs  Take 250 mg by mouth daily.     meclizine 25 MG tablet  Commonly known as:  ANTIVERT  Take 12.5 mg by mouth 3 (three) times daily as needed. For dizziness.     mometasone-formoterol 100-5 MCG/ACT Aero  Commonly known as:  DULERA  Take 2 puffs first thing in am and then another 2 puffs about 12 hours later.     montelukast 10 MG tablet  Commonly known as:  SINGULAIR  Take 10 mg by mouth at bedtime.     omeprazole 20 MG capsule  Commonly known as:  PRILOSEC  Take 1 capsule (20 mg total) by mouth daily.     ondansetron 4 MG tablet  Commonly known as:  ZOFRAN  Take 1 tablet (4 mg total) by mouth every 6 (six) hours as needed for nausea or vomiting.     OSTEO BI-FLEX ADV DOUBLE ST PO  Take 1 tablet by mouth daily.     PRESCRIPTION MEDICATION  every 14 (fourteen) days. Reported on 05/24/2015     PROBIOTIC DAILY PO  Take by mouth.     PYRIDIUM 200 MG tablet  Generic drug:  phenazopyridine  Take 200 mg by mouth.     ranitidine 150 MG tablet  Commonly known as:  ZANTAC  Take 150 mg by mouth 2 (two) times daily.     trolamine salicylate 10 % cream  Commonly known as:  ASPERCREME  Apply 1 application topically 2 (two) times daily as needed for muscle pain.     vitamin C 500 MG tablet  Commonly known as:  ASCORBIC ACID  Take 500 mg by mouth daily.     XGEVA  120 MG/1.7ML Soln injection  Generic drug:  denosumab  Inject 120 mg into the skin every 30 (thirty) days.        Allergies:  Allergies  Allergen Reactions  . Azithromycin     Stomach cramps. Green black stool   . Gnp [Acetaminophen]     "GNR" (cough syrup) "GNR" (cough syrup)   . Rofecoxib     Pt unsure   . Tramadol Nausea And Vomiting and Other (See Comments)    dizziness   . Avelox [Moxifloxacin Hcl In Nacl] Palpitations and Other (See Comments)    Body ache   . Etodolac Palpitations and Other (See Comments)    Joints hurt Joints hurt  . Moxifloxacin Other (See Comments) and Palpitations    Body ache    Past Medical History, Surgical history, Social history, and Family History were reviewed and updated.  Review of Systems: All other 10 point review of systems is negative.   Physical Exam:  height is '5\' 5"'  (1.651 m) and weight is 132 lb (59.875 kg). Her oral temperature is 97.2 F (36.2 C). Her blood pressure is 113/80 and her pulse is 88. Her respiration is 14.   Wt Readings from Last 3 Encounters:  05/29/15 132 lb (59.875 kg)  05/24/15 136 lb (61.689 kg)  05/07/15 136 lb (61.689 kg)    Well-developed well-nourished white female in no obvious distress. Head exam shows no ocular or oral lesions. There are no palpable cervical or supraclavicular lymph nodes. Lungs are clear. She has some slight wheezing in the central portion of the lungs. She has no rhonchi. Cardiac exam regular rate and rhythm with no murmurs, rubs or bruits. Abdomen is soft. She has the healing hernia incision in the right lower quadrant. There is no erythema surrounding this. There is no palpable fluid wave. There is no palpable abdominal mass. There is no palpable liver or spleen tip. Extremities shows no clubbing, cyanosis or edema. Neurological exam shows no focal neurological deficits. Skin exam shows no rashes, ecchymoses or petechia.  Lab Results  Component Value Date   WBC 5.6 05/29/2015     HGB 15.4 05/29/2015   HCT 45.7 05/29/2015   MCV 82 05/29/2015   PLT 205 05/29/2015   No results found for: FERRITIN, IRON,  TIBC, UIBC, IRONPCTSAT Lab Results  Component Value Date   RBC 5.56* 05/29/2015   No results found for: KPAFRELGTCHN, LAMBDASER, KAPLAMBRATIO No results found for: IGGSERUM, IGA, IGMSERUM No results found for: Kathrynn Ducking, MSPIKE, SPEI   Chemistry      Component Value Date/Time   NA 141 05/29/2015 0850   NA 137 05/07/2015 1110   NA 139 04/05/2015 1030   K 3.5 05/29/2015 0850   K 3.2* 05/07/2015 1110   K 3.6 04/05/2015 1030   CL 103 05/07/2015 1110   CL 102 04/05/2015 1030   CO2 29 05/29/2015 0850   CO2 25 05/07/2015 1110   CO2 27 04/05/2015 1030   BUN 26.7* 05/29/2015 0850   BUN 25* 05/07/2015 1110   BUN 23* 04/05/2015 1030   CREATININE 1.8* 05/29/2015 0850   CREATININE 1.5* 05/07/2015 1110   CREATININE 1.29* 04/05/2015 1030   GLU 104 07/20/2008      Component Value Date/Time   CALCIUM 10.6* 05/29/2015 0850   CALCIUM 8.6 05/07/2015 1110   CALCIUM 9.4 04/05/2015 1030   ALKPHOS 80 05/29/2015 0850   ALKPHOS 55 05/07/2015 1110   ALKPHOS 54 04/05/2015 1030   AST 24 05/29/2015 0850   AST 25 05/07/2015 1110   AST 23 04/05/2015 1030   ALT 18 05/29/2015 0850   ALT 19 05/07/2015 1110   ALT 18 04/05/2015 1030   BILITOT 0.76 05/29/2015 0850   BILITOT 0.70 05/07/2015 1110   BILITOT 0.5 04/05/2015 1030     Impression and Plan: Cathy Lewis is a very pleasant 70 yo white female with metastatic clear cell renal carcinoma diagnosed in 2013.   She seems to be tolerating the Inlyta fairly well.  I'm not sure was going on with her lower back. We did go ahead and get a plain film. The plain film did not show any obvious pathologic fracture. However, she has arthritic issues. She has the compression fracture which appears to be chronic at T12.  She is due to have an MRI of the lower back. She apparently is to  have this this weekend.  We will go ahead and give her the Delton See just today.  I will see her back in about 3-4 weeks.  I spent about 45 minutes with her and her husband today.   Volanda Napoleon, MD 4/5/20176:49 AM

## 2015-06-01 ENCOUNTER — Telehealth: Payer: Self-pay | Admitting: Internal Medicine

## 2015-06-01 ENCOUNTER — Ambulatory Visit: Payer: Medicare PPO | Admitting: Internal Medicine

## 2015-06-01 MED ORDER — MOMETASONE FURO-FORMOTEROL FUM 100-5 MCG/ACT IN AERO: INHALATION_SPRAY | RESPIRATORY_TRACT | Status: DC

## 2015-06-01 NOTE — Telephone Encounter (Signed)
Pt aware that Rx has been sent to [facility]. Nothing more needed at this time.

## 2015-06-02 ENCOUNTER — Ambulatory Visit
Admission: RE | Admit: 2015-06-02 | Discharge: 2015-06-02 | Disposition: A | Discharge status: Home / Self Care | Payer: Medicare PPO | Source: Ambulatory Visit | Attending: Interventional Radiology | Admitting: Interventional Radiology

## 2015-06-02 ENCOUNTER — Other Ambulatory Visit: Payer: Self-pay | Admitting: Radiation Oncology

## 2015-06-02 DIAGNOSIS — S22000B Wedge compression fracture of unspecified thoracic vertebra, initial encounter for open fracture: Secondary | ICD-10-CM

## 2015-06-04 ENCOUNTER — Telehealth: Payer: Self-pay | Admitting: Internal Medicine

## 2015-06-04 NOTE — Telephone Encounter (Signed)
Dulera was sent on 4.3.17 #1 inhaler Called [facility] and spoke with Jone Baseman who reported that pt was dispensed #13g which is a 30 day supply Called spoke with patient and discussed the above with her.  Pt was looking at the quantity marked on the box with does say 13 but she was still skeptical.  However, when she looked at the dose counter on the inhaler itself she saw that it contains #124 inhalations.    Nothing further needed; will sign off.

## 2015-06-05 ENCOUNTER — Ambulatory Visit: Payer: Medicare PPO | Admitting: Hematology & Oncology

## 2015-06-05 ENCOUNTER — Other Ambulatory Visit: Payer: Medicare PPO

## 2015-06-05 ENCOUNTER — Ambulatory Visit: Payer: Medicare PPO

## 2015-06-06 ENCOUNTER — Telehealth: Payer: Self-pay | Admitting: *Deleted

## 2015-06-06 NOTE — Telephone Encounter (Signed)
[name] nausea x one week increasing in severity the last 24-48 hours. She states she is not vomiting, but carries a bucket because she feels like she is close to vomiting. She tried one dose of zofran but stated it was not effective. She believes this to be a result of the Inlyta. Of note, she also has a kyphoplasty scheduled for Monday which she's unsure of having done.   Spoke with Dr Marin Olp. He wants patient to stop the Inlyta and take the zofran as prescribed until she feels like relief. He also strongly suggests that patient follow through with procedure on Monday.   Reviewed all this with the patient and she understands. She will stop the Inlyta, take the zofran as prescribed and she agrees to have the procedure on Monday.

## 2015-06-13 ENCOUNTER — Other Ambulatory Visit (HOSPITAL_COMMUNITY): Payer: Self-pay | Admitting: Interventional Radiology

## 2015-06-13 DIAGNOSIS — C7951 Secondary malignant neoplasm of bone: Secondary | ICD-10-CM

## 2015-06-14 ENCOUNTER — Other Ambulatory Visit: Payer: Self-pay | Admitting: *Deleted

## 2015-06-14 MED ORDER — ONDANSETRON HCL 8 MG PO TABS: 4.0000 mg | ORAL_TABLET | Freq: Four times a day (QID) | ORAL | Status: DC | PRN

## 2015-06-28 ENCOUNTER — Other Ambulatory Visit (HOSPITAL_BASED_OUTPATIENT_CLINIC_OR_DEPARTMENT_OTHER): Payer: Medicare PPO

## 2015-06-28 ENCOUNTER — Encounter: Payer: Self-pay | Admitting: Hematology & Oncology

## 2015-06-28 ENCOUNTER — Ambulatory Visit (HOSPITAL_BASED_OUTPATIENT_CLINIC_OR_DEPARTMENT_OTHER): Payer: Medicare PPO | Admitting: Hematology & Oncology

## 2015-06-28 ENCOUNTER — Ambulatory Visit (HOSPITAL_BASED_OUTPATIENT_CLINIC_OR_DEPARTMENT_OTHER): Payer: Medicare PPO

## 2015-06-28 VITALS — BP 121/89 | HR 92 | Temp 97.4°F | Resp 16 | Ht 65.0 in | Wt 126.0 lb

## 2015-06-28 DIAGNOSIS — C649 Malignant neoplasm of unspecified kidney, except renal pelvis: Secondary | ICD-10-CM | POA: Diagnosis not present

## 2015-06-28 DIAGNOSIS — C7951 Secondary malignant neoplasm of bone: Secondary | ICD-10-CM

## 2015-06-28 DIAGNOSIS — M545 Low back pain: Secondary | ICD-10-CM

## 2015-06-28 DIAGNOSIS — N390 Urinary tract infection, site not specified: Secondary | ICD-10-CM

## 2015-06-28 LAB — CMP (CANCER CENTER ONLY)
ALBUMIN: 3.8 g/dL (ref 3.3–5.5)
ALT(SGPT): 26 U/L (ref 10–47)
AST: 34 U/L (ref 11–38)
Alkaline Phosphatase: 76 U/L (ref 26–84)
BILIRUBIN TOTAL: 0.8 mg/dL (ref 0.20–1.60)
BUN, Bld: 22 mg/dL (ref 7–22)
CALCIUM: 9.4 mg/dL (ref 8.0–10.3)
CHLORIDE: 97 meq/L — AB (ref 98–108)
CO2: 28 meq/L (ref 18–33)
Creat: 1.4 mg/dl — ABNORMAL HIGH (ref 0.6–1.2)
Glucose, Bld: 104 mg/dL (ref 73–118)
Potassium: 3.1 mEq/L — ABNORMAL LOW (ref 3.3–4.7)
Sodium: 137 mEq/L (ref 128–145)
Total Protein: 7.6 g/dL (ref 6.4–8.1)

## 2015-06-28 LAB — CBC WITH DIFFERENTIAL (CANCER CENTER ONLY)
BASO#: 0 10*3/uL (ref 0.0–0.2)
BASO%: 0.4 % (ref 0.0–2.0)
EOS ABS: 0.1 10*3/uL (ref 0.0–0.5)
EOS%: 1.3 % (ref 0.0–7.0)
HEMATOCRIT: 42.8 % (ref 34.8–46.6)
HEMOGLOBIN: 14.5 g/dL (ref 11.6–15.9)
LYMPH#: 1.5 10*3/uL (ref 0.9–3.3)
LYMPH%: 27.7 % (ref 14.0–48.0)
MCH: 28.2 pg (ref 26.0–34.0)
MCHC: 33.9 g/dL (ref 32.0–36.0)
MCV: 83 fL (ref 81–101)
MONO#: 0.4 10*3/uL (ref 0.1–0.9)
MONO%: 8.1 % (ref 0.0–13.0)
NEUT%: 62.5 % (ref 39.6–80.0)
NEUTROS ABS: 3.3 10*3/uL (ref 1.5–6.5)
Platelets: 235 10*3/uL (ref 145–400)
RBC: 5.14 10*6/uL (ref 3.70–5.32)
RDW: 17.7 % — ABNORMAL HIGH (ref 11.1–15.7)
WBC: 5.3 10*3/uL (ref 3.9–10.0)

## 2015-06-28 LAB — LACTATE DEHYDROGENASE: LDH: 202 U/L (ref 125–245)

## 2015-06-28 MED ORDER — KETOROLAC TROMETHAMINE 15 MG/ML IJ SOLN
30.0000 mg | Freq: Once | INTRAMUSCULAR | Status: AC
  Administered 2015-06-28: 30 mg via INTRAVENOUS

## 2015-06-28 MED ORDER — DENOSUMAB 120 MG/1.7ML ~~LOC~~ SOLN
120.0000 mg | Freq: Once | SUBCUTANEOUS | Status: AC
  Administered 2015-06-28: 120 mg via SUBCUTANEOUS
  Filled 2015-06-28: qty 1.7

## 2015-06-28 MED ORDER — SODIUM CHLORIDE 0.9 % IV SOLN
INTRAVENOUS | Status: DC
  Administered 2015-06-28: 15:00:00 via INTRAVENOUS

## 2015-06-28 MED ORDER — HYDROMORPHONE HCL 2 MG PO TABS: 2.0000 mg | ORAL_TABLET | ORAL | Status: DC | PRN

## 2015-06-28 NOTE — Patient Instructions (Signed)
Dehydration, Adult Dehydration is a condition in which you do not have enough fluid or water in your body. It happens when you take in less fluid than you lose. Vital organs such as the kidneys, brain, and heart cannot function without a proper amount of fluids. Any loss of fluids from the body can cause dehydration.  Dehydration can range from mild to severe. This condition should be treated right away to help prevent it from becoming severe. CAUSES  This condition may be caused by:  Vomiting.  Diarrhea.  Excessive sweating, such as when exercising in hot or humid weather.  Not drinking enough fluid during strenuous exercise or during an illness.  Excessive urine output.  Fever.  Certain medicines. RISK FACTORS This condition is more likely to develop in:  People who are taking certain medicines that cause the body to lose excess fluid (diuretics).   People who have a chronic illness, such as diabetes, that may increase urination.  Older adults.   People who live at high altitudes.   People who participate in endurance sports.  SYMPTOMS  Mild Dehydration  Thirst.  Dry lips.  Slightly dry mouth.  Dry, warm skin. Moderate Dehydration  Very dry mouth.   Muscle cramps.   Dark urine and decreased urine production.   Decreased tear production.   Headache.   Light-headedness, especially when you stand up from a sitting position.  Severe Dehydration  Changes in skin.   Cold and clammy skin.   Skin does not spring back quickly when lightly pinched and released.   Changes in body fluids.   Extreme thirst.   No tears.   Not able to sweat when body temperature is high, such as in hot weather.   Minimal urine production.   Changes in vital signs.   Rapid, weak pulse (more than 100 beats per minute when you are sitting still).   Rapid breathing.   Low blood pressure.   Other changes.   Sunken eyes.   Cold hands and feet.    Confusion.  Lethargy and difficulty being awakened.  Fainting (syncope).   Short-term weight loss.   Unconsciousness. DIAGNOSIS  This condition may be diagnosed based on your symptoms. You may also have tests to determine how severe your dehydration is. These tests may include:   Urine tests.   Blood tests.  TREATMENT  Treatment for this condition depends on the severity. Mild or moderate dehydration can often be treated at home. Treatment should be started right away. Do not wait until dehydration becomes severe. Severe dehydration needs to be treated at the hospital. Treatment for Mild Dehydration  Drinking plenty of water to replace the fluid you have lost.   Replacing minerals in your blood (electrolytes) that you may have lost.  Treatment for Moderate Dehydration  Consuming oral rehydration solution (ORS). Treatment for Severe Dehydration  Receiving fluid through an IV tube.   Receiving electrolyte solution through a feeding tube that is passed through your nose and into your stomach (nasogastric tube or NG tube).  Correcting any abnormalities in electrolytes. HOME CARE INSTRUCTIONS   Drink enough fluid to keep your urine clear or pale yellow.   Drink water or fluid slowly by taking small sips. You can also try sucking on ice cubes.  Have food or beverages that contain electrolytes. Examples include bananas and sports drinks.  Take over-the-counter and prescription medicines only as told by your health care provider.   Prepare ORS according to the manufacturer's instructions. Take sips   of ORS every 5 minutes until your urine returns to normal.  If you have vomiting or diarrhea, continue to try to drink water, ORS, or both.   If you have diarrhea, avoid:   Beverages that contain caffeine.   Fruit juice.   Milk.   Carbonated soft drinks.  Do not take salt tablets. This can lead to the condition of having too much sodium in your body  (hypernatremia).  SEEK MEDICAL CARE IF:  You cannot eat or drink without vomiting.  You have had moderate diarrhea during a period of more than 24 hours.  You have a fever. SEEK IMMEDIATE MEDICAL CARE IF:   You have extreme thirst.  You have severe diarrhea.  You have not urinated in 6-8 hours, or you have urinated only a small amount of very dark urine.  You have shriveled skin.  You are dizzy, confused, or both.   This information is not intended to replace advice given to you by your health care provider. Make sure you discuss any questions you have with your health care provider.   Document Released: 02/10/2005 Document Revised: 11/01/2014 Document Reviewed: 06/28/2014 Elsevier Interactive Patient Education 2016 [employer] Denosumab injection What is this medicine? DENOSUMAB (den oh sue mab) slows bone breakdown. Prolia is used to treat osteoporosis in women after menopause and in men. Delton See is used to prevent bone fractures and other bone problems caused by cancer bone metastases. Delton See is also used to treat giant cell tumor of the bone. This medicine may be used for other purposes; ask your health care provider or pharmacist if you have questions. What should I tell my health care provider before I take this medicine? They need to know if you have any of these conditions: -dental disease -eczema -infection or history of infections -kidney disease or on dialysis -low blood calcium or vitamin D -malabsorption syndrome -scheduled to have surgery or tooth extraction -taking medicine that contains denosumab -thyroid or parathyroid disease -an unusual reaction to denosumab, other medicines, foods, dyes, or preservatives -pregnant or trying to get pregnant -breast-feeding How should I use this medicine? This medicine is for injection under the skin. It is given by a health care professional in a hospital or clinic setting. If you are getting Prolia, a special MedGuide  will be given to you by the pharmacist with each prescription and refill. Be sure to read this information carefully each time. For Prolia, talk to your pediatrician regarding the use of this medicine in children. Special care may be needed. For Delton See, talk to your pediatrician regarding the use of this medicine in children. While this drug may be prescribed for children as young as 13 years for selected conditions, precautions do apply. Overdosage: If you think you have taken too much of this medicine contact a poison control center or emergency room at once. NOTE: This medicine is only for you. Do not share this medicine with others. What if I miss a dose? It is important not to miss your dose. Call your doctor or health care professional if you are unable to keep an appointment. What may interact with this medicine? Do not take this medicine with any of the following medications: -other medicines containing denosumab This medicine may also interact with the following medications: -medicines that suppress the immune system -medicines that treat cancer -steroid medicines like prednisone or cortisone This list may not describe all possible interactions. Give your health care provider a list of all the medicines, herbs, non-prescription drugs,  or dietary supplements you use. Also tell them if you smoke, drink alcohol, or use illegal drugs. Some items may interact with your medicine. What should I watch for while using this medicine? Visit your doctor or health care professional for regular checks on your progress. Your doctor or health care professional may order blood tests and other tests to see how you are doing. Call your doctor or health care professional if you get a cold or other infection while receiving this medicine. Do not treat yourself. This medicine may decrease your body's ability to fight infection. You should make sure you get enough calcium and vitamin D while you are taking this  medicine, unless your doctor tells you not to. Discuss the foods you eat and the vitamins you take with your health care professional. See your dentist regularly. Brush and floss your teeth as directed. Before you have any dental work done, tell your dentist you are receiving this medicine. Do not become pregnant while taking this medicine or for 5 months after stopping it. Women should inform their doctor if they wish to become pregnant or think they might be pregnant. There is a potential for serious side effects to an unborn child. Talk to your health care professional or pharmacist for more information. What side effects may I notice from receiving this medicine? Side effects that you should report to your doctor or health care professional as soon as possible: -allergic reactions like skin rash, itching or hives, swelling of the face, lips, or tongue -breathing problems -chest pain -fast, irregular heartbeat -feeling faint or lightheaded, falls -fever, chills, or any other sign of infection -muscle spasms, tightening, or twitches -numbness or tingling -skin blisters or bumps, or is dry, peels, or red -slow healing or unexplained pain in the mouth or jaw -unusual bleeding or bruising Side effects that usually do not require medical attention (Report these to your doctor or health care professional if they continue or are bothersome.): -muscle pain -stomach upset, gas This list may not describe all possible side effects. Call your doctor for medical advice about side effects. You may report side effects to FDA at 1-800-FDA-1088. Where should I keep my medicine? This medicine is only given in a clinic, doctor's office, or other health care setting and will not be stored at home. NOTE: This sheet is a summary. It may not cover all possible information. If you have questions about this medicine, talk to your doctor, pharmacist, or health care provider.    2016, Elsevier/Gold Standard.  (2011-08-11 12:37:47)

## 2015-06-29 NOTE — Progress Notes (Signed)
Hematology and Oncology Follow Up Visit  Cathy Lewis [id number] [birth year] 70 y.o. 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  She comes in a wheelchair. She's having more pain in the lower back. We did go ahead and get some plain films. 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  She did have an MRI done. This was back in mid April. This showed a chronic vertebral deformity at T12-L1. It was felt at this might be from metastatic disease. Everything looked stable.  She is not eating much. She has lost weight. She says her mouth is sore and it burns. I don't know this is from the Cathy Lewis However, I think we probably are going to have to stop the Inlyta.   She has had no headache. She's constipated. She's having more pain because she's not taking pain medication.   There has not been any fever. She's had no obvious bleeding.   Overall, her performance status is ECOG 2-3.    Medications:    Medication List       This list is accurate as of: 06/28/15 11:59 PM.  Always use your most recent med list.               acetaminophen 500 MG tablet  Commonly known as:  TYLENOL  Take 1,000 mg by mouth every 6 (six) hours as needed. Reported on 05/24/2015     albuterol 90 MCG/ACT inhaler  Commonly known as:  PROVENTIL,VENTOLIN  Inhale 2 puffs into the lungs every 4 (four) hours as needed. Wheezing     amLODipine 5 MG tablet  Commonly known as:  NORVASC  Take 5 mg by mouth daily.     axitinib 5 MG tablet  Commonly known as:  INLYTA  Take 1 tablet (5 mg total) by mouth 2 (two) times daily.     CALCIUM + D PO  Take 1 tablet by  mouth daily.     [facility] GLUCOSAMINE-CHONDROITIN 500-400 MG tablet  Generic drug:  glucosamine-chondroitin  Take by mouth.     dicyclomine 20 MG tablet  Commonly known as:  BENTYL  Take 20 mg by mouth 3 (three) times daily as needed (Stomach Cramps).     hydrochlorothiazide 25 MG tablet  Commonly known as:  HYDRODIURIL  Take 25 mg by mouth daily.     HYDROcodone-acetaminophen 5-325 MG tablet  Commonly known as:  NORCO/VICODIN  Take 1-2 tablets by mouth every 4 (four) hours as needed for moderate pain or severe pain.     HYDROmorphone 2 MG tablet  Commonly known as:  DILAUDID  Take 1 tablet (2 mg total) by mouth every 4 (four) hours as needed for severe pain.     Magnesium 250 MG Tabs  Take 250 mg by mouth daily.     meclizine 25 MG tablet  Commonly known as:  ANTIVERT  Take 12.5 mg by mouth 3 (three) times daily as needed. For dizziness.     mometasone-formoterol 100-5 MCG/ACT Aero  Commonly known as:  DULERA  Take 2 puffs first thing in am and then another 2 puffs about 12 hours later.  montelukast 10 MG tablet  Commonly known as:  SINGULAIR  Take 10 mg by mouth at bedtime.     omeprazole 20 MG capsule  Commonly known as:  PRILOSEC  Take 1 capsule (20 mg total) by mouth daily.     ondansetron 8 MG tablet  Commonly known as:  ZOFRAN  Take 0.5 tablets (4 mg total) by mouth every 6 (six) hours as needed for nausea or vomiting.     OSTEO BI-FLEX ADV DOUBLE ST PO  Take 1 tablet by mouth daily.     PRESCRIPTION MEDICATION  every 14 (fourteen) days. Reported on 05/24/2015     PROBIOTIC DAILY PO  Take by mouth.     ranitidine 150 MG tablet  Commonly known as:  ZANTAC  Take 150 mg by mouth 2 (two) times daily.     trolamine salicylate 10 % cream  Commonly known as:  ASPERCREME  Apply 1 application topically 2 (two) times daily as needed for muscle pain.     vitamin C 500 MG tablet  Commonly known as:  ASCORBIC ACID  Take 500 mg by mouth daily.     XGEVA 120  MG/1.7ML Soln injection  Generic drug:  denosumab  Inject 120 mg into the skin every 30 (thirty) days.        Allergies:  Allergies  Allergen Reactions  . Azithromycin     Stomach cramps. Green black stool   . Gnp [Acetaminophen]     "GNR" (cough syrup) "GNR" (cough syrup)   . Rofecoxib     Pt unsure   . Tramadol Nausea And Vomiting and Other (See Comments)    dizziness   . Avelox [Moxifloxacin Hcl In Nacl] Palpitations and Other (See Comments)    Body ache   . Etodolac Palpitations and Other (See Comments)    Joints hurt Joints hurt  . Moxifloxacin Other (See Comments) and Palpitations    Body ache    Past Medical History, Surgical history, Social history, and Family History were reviewed and updated.  Review of Systems: All other 10 point review of systems is negative.   Physical Exam:  height is '5\' 5"'  (1.651 m) and weight is 126 lb (57.153 kg). Her oral temperature is 97.4 F (36.3 C). Her blood pressure is 121/89 and her pulse is 92. Her respiration is 16.   Wt Readings from Last 3 Encounters:  06/28/15 126 lb (57.153 kg)  05/29/15 132 lb (59.875 kg)  05/24/15 136 lb (61.689 kg)    Well-developed well-nourished white female in no obvious distress. Head exam shows no ocular or oral lesions. There are no palpable cervical or supraclavicular lymph nodes. Lungs are clear. She has some slight wheezing in the central portion of the lungs. She has no rhonchi. Cardiac exam regular rate and rhythm with no murmurs, rubs or bruits. Abdomen is soft. She has the healing hernia incision in the right lower quadrant. There is no erythema surrounding this. There is no palpable fluid wave. There is no palpable abdominal mass. There is no palpable liver or spleen tip. Extremities shows no clubbing, cyanosis or edema. Neurological exam shows no focal neurological deficits. Skin exam shows no rashes, ecchymoses or petechia.  Lab Results  Component Value Date   WBC 5.3 06/28/2015    HGB 14.5 06/28/2015   HCT 42.8 06/28/2015   MCV 83 06/28/2015   PLT 235 06/28/2015   No results found for: FERRITIN, IRON, TIBC, UIBC, IRONPCTSAT Lab Results  Component Value Date  RBC 5.14 06/28/2015   No results found for: KPAFRELGTCHN, LAMBDASER, KAPLAMBRATIO No results found for: Kandis Cocking, IGMSERUM No results found for: Odetta Pink, SPEI   Chemistry      Component Value Date/Time   NA 137 06/28/2015 1307   NA 141 05/29/2015 0850   NA 139 04/05/2015 1030   K 3.1* 06/28/2015 1307   K 3.5 05/29/2015 0850   K 3.6 04/05/2015 1030   CL 97* 06/28/2015 1307   CL 102 04/05/2015 1030   CO2 28 06/28/2015 1307   CO2 29 05/29/2015 0850   CO2 27 04/05/2015 1030   BUN 22 06/28/2015 1307   BUN 26.7* 05/29/2015 0850   BUN 23* 04/05/2015 1030   CREATININE 1.4* 06/28/2015 1307   CREATININE 1.8* 05/29/2015 0850   CREATININE 1.29* 04/05/2015 1030   GLU 104 07/20/2008      Component Value Date/Time   CALCIUM 9.4 06/28/2015 1307   CALCIUM 10.6* 05/29/2015 0850   CALCIUM 9.4 04/05/2015 1030   ALKPHOS 76 06/28/2015 1307   ALKPHOS 80 05/29/2015 0850   ALKPHOS 54 04/05/2015 1030   AST 34 06/28/2015 1307   AST 24 05/29/2015 0850   AST 23 04/05/2015 1030   ALT 26 06/28/2015 1307   ALT 18 05/29/2015 0850   ALT 18 04/05/2015 1030   BILITOT 0.80 06/28/2015 1307   BILITOT 0.76 05/29/2015 0850   BILITOT 0.5 04/05/2015 1030     Impression and Plan: Cathy Lewis is a very pleasant 70 yo white female with metastatic clear cell renal carcinoma diagnosed in 2013.   For right now, I think we are are going to have to have her stop the light. I don't of this is causing some of her problems.  It is certainly possible that she may be having progressive disease. If that is the case, then we clearly we'll have to change treatments on her.  I think she is dehydrated. We will go ahead and give her IV fluids. I also will make sure she gets  some IV medications. She's not taking any pain medication because she is worried about how they constipate her. She says that she cannot take laxatives because they cause a lot of abdominal pain and spasms.  If we have to make a change in treatment, then we will probably switch to cabozantinib (Cabometyx).I did another option would be to go with Avastin.   We will go and get her set up with our scans.  I know that she has a lot of bony metastasis. I think she has had kyphoplasty already. However, this might be an option to think about.   I spent about 40 minutes with her and her niece today.   Volanda Napoleon, MD 5/5/20171:56 PM

## 2015-07-02 ENCOUNTER — Other Ambulatory Visit: Payer: Self-pay | Admitting: Family

## 2015-07-02 DIAGNOSIS — C649 Malignant neoplasm of unspecified kidney, except renal pelvis: Secondary | ICD-10-CM

## 2015-07-02 DIAGNOSIS — C7951 Secondary malignant neoplasm of bone: Principal | ICD-10-CM

## 2015-07-03 ENCOUNTER — Ambulatory Visit
Admission: RE | Admit: 2015-07-03 | Discharge: 2015-07-03 | Disposition: A | Discharge status: Home / Self Care | Payer: Medicare PPO | Source: Ambulatory Visit | Attending: Interventional Radiology | Admitting: Interventional Radiology

## 2015-07-03 DIAGNOSIS — C7951 Secondary malignant neoplasm of bone: Secondary | ICD-10-CM

## 2015-07-03 HISTORY — PX: IR GENERIC HISTORICAL: IMG1180011

## 2015-07-03 NOTE — Progress Notes (Signed)
Referring Physician(s): Dr Burney Gauze  Chief Complaint: The patient is seen in follow up today s/p  Thoracic 12 co ablation with vertebroplasty 06/11/2015  History of present illness:  Hx renal cell cancer Followed by Dr Marin Olp Recently stopped chemotherapy secondary "side effects" Nausea; pain; sleepless  Pt states she did well with good pain relief after 06/11/15 VP procedure Lasted about 1 week Then developed pain to right of original pain site Denies injury Does admit to continuing to perform daily activities: [place]; grocery shopping; washing dishes; laundry... This pain to right of midline at level of T 12 site seems worse with standing for prolonged periods of time-- dishwashing and cooking Pain does limit lifting and bending ability. Has Hydrocodone at home but does not like to use it. Tylenol occasionally helps for short time. Resting helps most. Heating pad with minimal relief.  Scheduled today for recheck with Dr Barbie Banner   Past Medical History  Diagnosis Date  . Atypical chest pain     NORMAL CARDIOLITE STUDY   . Dyslipidemia   . Hypertension   . Syncope     HISTORY OF  . Asthma   . Seasonal allergies   . GERD (gastroesophageal reflux disease)   . Arthritis     arthritis in knees and back   . Complication of anesthesia     hard to wake up from umb hernia-was given alot of meds  . Metastatic renal cell carcinoma to bone ([place]) 11/20/2014    Past Surgical History  Procedure Laterality Date  . Appendectomy    . Carpal tunnel release      R  . Other surgical history      bladder vault prolapse  . Other surgical history      right foot bunion and hammer toe surgery   . Other surgical history      right knee arthroscopic   . Other surgical history      right arm ( underarm ) surgery removed ? cyst   . Dilation and curettage of uterus    . Tubal ligation      1977  . Total knee arthroplasty  03/11/2011    Procedure: TOTAL KNEE ARTHROPLASTY;   Surgeon: Cynda Familia, MD;  Location: [facility];  Service: Orthopedics;  Laterality: Right;  . Abdominal hysterectomy  2004    rectocele  . Cystocele repair    . Colonoscopy    . Hernia repair  2014    umb  . 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 Inguinal hernia repair Left 01/03/2014    Procedure: LEFT INGUINAL HERNIA REPAIR ;  Surgeon: [name], MD;  Location: [facility];  Service: General;  Laterality: Left;  . Insertion of mesh Left 01/03/2014    Procedure: INSERTION OF MESH;  Surgeon: [name], MD;  Location: [facility];  Service: General;  Laterality: Left;  . Joint replacement    . Inguinal hernia repair Right 04/06/2015    Procedure: HERNIA REPAIR RIGHT  INGUINAL ADULT WITH MESH;  Surgeon: [name], MD;  Location: [facility];  Service: General;  Laterality: Right;  . Insertion of mesh Right 04/06/2015    Procedure: INSERTION OF MESH;  Surgeon: [name], MD;  Location: [facility];  Service: General;  Laterality: Right;    Allergies: Azithromycin; Gnp; Rofecoxib; Tramadol; Avelox; Etodolac; and Moxifloxacin  Medications: Prior to Admission medications   Medication Sig Start Date End Date Taking? Authorizing Provider  acetaminophen (  TYLENOL) 500 MG tablet Take 1,000 mg by mouth every 6 (six) hours as needed. Reported on 05/24/2015   Yes Historical Provider, MD  albuterol (PROVENTIL,VENTOLIN) 90 MCG/ACT inhaler Inhale 2 puffs into the lungs every 4 (four) hours as needed. Wheezing    Yes Historical Provider, MD  amLODipine (NORVASC) 5 MG tablet Take 5 mg by mouth daily.   Yes Historical Provider, MD  Calcium Carbonate-Vitamin D (CALCIUM + D PO) Take 1 tablet by mouth daily.    Yes Historical Provider, MD  denosumab (XGEVA) 120 MG/1.7ML SOLN injection Inject 120 mg into the skin every 30 (thirty) days.   Yes Historical Provider, MD  dicyclomine (BENTYL) 20 MG tablet Take 20 mg by mouth 3 (three) times daily as needed  (Stomach Cramps).   Yes Historical Provider, MD  glucosamine-chondroitin ([employer]S GLUCOSAMINE-CHONDROITIN) 500-400 MG tablet Take by mouth.   Yes Historical Provider, MD  hydrochlorothiazide (HYDRODIURIL) 25 MG tablet Take 25 mg by mouth daily.   Yes Historical Provider, MD  HYDROcodone-acetaminophen (NORCO/VICODIN) 5-325 MG tablet Take 1-2 tablets by mouth every 4 (four) hours as needed for moderate pain or severe pain. 04/06/15  Yes [name], MD  HYDROmorphone (DILAUDID) 2 MG tablet Take 1 tablet (2 mg total) by mouth every 4 (four) hours as needed for severe pain. 06/28/15  Yes Volanda Napoleon, MD  Magnesium 250 MG TABS Take 250 mg by mouth daily.   Yes Historical Provider, MD  meclizine (ANTIVERT) 25 MG tablet Take 12.5 mg by mouth 3 (three) times daily as needed. For dizziness.   Yes Historical Provider, MD  Misc Natural Products (OSTEO BI-FLEX ADV DOUBLE ST PO) Take 1 tablet by mouth daily.   Yes Historical Provider, MD  mometasone-formoterol (DULERA) 100-5 MCG/ACT AERO Take 2 puffs first thing in am and then another 2 puffs about 12 hours later. 06/01/15  Yes Tanda Rockers, MD  montelukast (SINGULAIR) 10 MG tablet Take 10 mg by mouth at bedtime.    Yes Historical Provider, MD  omeprazole (PRILOSEC) 20 MG capsule Take 1 capsule (20 mg total) by mouth daily. 05/02/15  Yes Tanda Rockers, MD  ondansetron (ZOFRAN) 8 MG tablet Take 0.5 tablets (4 mg total) by mouth every 6 (six) hours as needed for nausea or vomiting. 06/14/15  Yes Volanda Napoleon, MD  axitinib (INLYTA) 5 MG tablet Take 1 tablet (5 mg total) by mouth 2 (two) times daily. Patient not taking: Reported on 07/03/2015 05/07/15   Volanda Napoleon, MD  PRESCRIPTION MEDICATION every 14 (fourteen) days. Reported on 05/24/2015    Historical Provider, MD  Probiotic Product (PROBIOTIC DAILY PO) Take by mouth.    Historical Provider, MD  ranitidine (ZANTAC) 150 MG tablet Take 150 mg by mouth 2 (two) times daily.    Historical Provider, MD  trolamine  salicylate (ASPERCREME) 10 % cream Apply 1 application topically 2 (two) times daily as needed for muscle pain.    Historical Provider, MD  vitamin C (ASCORBIC ACID) 500 MG tablet Take 500 mg by mouth daily.    Historical Provider, MD     Family History  Problem Relation Age of Onset  . Heart attack Father     DECEASED  . Cancer Mother     DECEASED-unsure of type  . Cancer Brother     LUNG CA- smoked  . Aneurysm Brother     AAA  . Asthma Sister     Social History   Social History  . Marital Status: Married  Spouse Name: N/A  . Number of Children: N/A  . Years of Education: N/A   Social History Main Topics  . Smoking status: Former Smoker -- 0.25 packs/day for 10 years    Quit date: 02/24/1985  . Smokeless tobacco: Not on file  . Alcohol Use: No  . Drug Use: No  . Sexual Activity: Not on file   Other Topics Concern  . Not on file   Social History Narrative     Vital Signs: BP 113/67 mmHg  Pulse 105  Temp(Src) 98.1 F (36.7 C) (Oral)  Resp 15  SpO2 97%  Physical Exam  Constitutional: She is oriented to person, place, and time. She appears well-nourished.  Cardiovascular: Normal rate, regular rhythm and normal heart sounds.   Pulmonary/Chest: Effort normal and breath sounds normal.  Abdominal: Soft. Bowel sounds are normal.  Musculoskeletal: Normal range of motion. She exhibits no edema or tenderness.  Moves all 4s Walking and moving well without walker or cane Some tenderness noted pushing hard onto site directly; cannot illicit pain to right of site  Neurological: She is alert and oriented to person, place, and time.  Skin: Skin is warm and dry.  Skin site of T12 KP is not red; no swelling No sign of infection Tiny scab noted----tender to pt to touch this   Psychiatric: She has a normal mood and affect. Her behavior is normal. Judgment and thought content normal.  Nursing note and vitals reviewed.   Imaging: No results  found.  Labs:  CBC:  Recent Labs  04/17/15 0913 05/07/15 1109 05/29/15 0849 06/28/15 1307  WBC 7.4 4.9 5.6 5.3  HGB 11.7 11.6 15.4 14.5  HCT 36.8 36.2 45.7 42.8  PLT 304 245 205 235    COAGS: No results for input(s): INR, APTT in the last 8760 hours.  BMP:  Recent Labs  12/01/14 2017  04/05/15 1030 04/17/15 0913 05/07/15 1110 05/29/15 0850 06/28/15 1307  NA 133*  < > 139 135 137 141 137  K 3.7  < > 3.6 4.0 3.2* 3.5 3.1*  CL 101  < > 102 100 103  --  97*  CO2 26  < > 27 30 25 29 28   GLUCOSE 108*  < > 98 82 99 133 104  BUN 21*  < > 23* 22 25* 26.7* 22  CALCIUM 9.1  < > 9.4 9.5 8.6 10.6* 9.4  CREATININE 1.44*  < > 1.29* 1.5* 1.5* 1.8* 1.4*  GFRNONAA 36*  --  41*  --   --   --   --   GFRAA 41*  --  47*  --   --   --   --   < > = values in this interval not displayed.  LIVER FUNCTION TESTS:  Recent Labs  04/17/15 0913 05/07/15 1110 05/29/15 0850 06/28/15 1307  BILITOT 0.50 0.70 0.76 0.80  AST 24 25 24  34  ALT 18 19 18 26   ALKPHOS 56 55 80 76  PROT 7.2 7.1 7.9 7.6  ALBUMIN 3.3 3.6 4.0 3.8    Assessment:  Hx Renal Cell Cancer Follow with Dr Marin Olp T12 vertebroplasty with co ablation 06/11/15 New pain to right of site of procedure Dr Barbie Banner has seen and examined pt Feels new pain to be probable muscular vs nerve pain Rec: consider epidural steroid injection Pt to have bone scan with Dr Marin Olp next few days Will await results---call us if wants to move ahead with injection.   Signed: Aitanna Haubner A 07/03/2015, 1:59  PM   Please refer to Dr. Barbie Banner attestation of this note for management and plan.

## 2015-07-20 ENCOUNTER — Ambulatory Visit: Payer: Medicare PPO

## 2015-07-20 ENCOUNTER — Ambulatory Visit: Payer: Medicare PPO | Admitting: Hematology & Oncology

## 2015-07-20 ENCOUNTER — Other Ambulatory Visit: Payer: Medicare PPO

## 2015-07-24 ENCOUNTER — Ambulatory Visit (HOSPITAL_BASED_OUTPATIENT_CLINIC_OR_DEPARTMENT_OTHER)
Admission: RE | Admit: 2015-07-24 | Discharge: 2015-07-24 | Disposition: A | Discharge status: Home / Self Care | Payer: Medicare PPO | Source: Ambulatory Visit | Attending: Family | Admitting: Family

## 2015-07-24 ENCOUNTER — Other Ambulatory Visit (HOSPITAL_BASED_OUTPATIENT_CLINIC_OR_DEPARTMENT_OTHER): Payer: Medicare PPO

## 2015-07-24 ENCOUNTER — Encounter: Payer: Self-pay | Admitting: Family

## 2015-07-24 ENCOUNTER — Telehealth: Payer: Self-pay | Admitting: *Deleted

## 2015-07-24 ENCOUNTER — Other Ambulatory Visit: Payer: Self-pay | Admitting: Family

## 2015-07-24 ENCOUNTER — Ambulatory Visit: Payer: Medicare PPO

## 2015-07-24 ENCOUNTER — Ambulatory Visit (HOSPITAL_BASED_OUTPATIENT_CLINIC_OR_DEPARTMENT_OTHER): Payer: Medicare PPO | Admitting: Family

## 2015-07-24 VITALS — BP 110/58 | HR 90 | Temp 98.3°F | Resp 16 | Ht 65.0 in | Wt 124.0 lb

## 2015-07-24 DIAGNOSIS — C649 Malignant neoplasm of unspecified kidney, except renal pelvis: Secondary | ICD-10-CM | POA: Insufficient documentation

## 2015-07-24 DIAGNOSIS — R112 Nausea with vomiting, unspecified: Secondary | ICD-10-CM

## 2015-07-24 DIAGNOSIS — Z905 Acquired absence of kidney: Secondary | ICD-10-CM | POA: Diagnosis not present

## 2015-07-24 DIAGNOSIS — R21 Rash and other nonspecific skin eruption: Secondary | ICD-10-CM | POA: Insufficient documentation

## 2015-07-24 DIAGNOSIS — C7951 Secondary malignant neoplasm of bone: Principal | ICD-10-CM

## 2015-07-24 DIAGNOSIS — R109 Unspecified abdominal pain: Secondary | ICD-10-CM | POA: Diagnosis not present

## 2015-07-24 DIAGNOSIS — R918 Other nonspecific abnormal finding of lung field: Secondary | ICD-10-CM | POA: Insufficient documentation

## 2015-07-24 DIAGNOSIS — N289 Disorder of kidney and ureter, unspecified: Secondary | ICD-10-CM | POA: Diagnosis not present

## 2015-07-24 LAB — CMP (CANCER CENTER ONLY)
ALBUMIN: 3.8 g/dL (ref 3.3–5.5)
ALT(SGPT): 16 U/L (ref 10–47)
AST: 24 U/L (ref 11–38)
Alkaline Phosphatase: 64 U/L (ref 26–84)
BUN, Bld: 18 mg/dL (ref 7–22)
CO2: 26 meq/L (ref 18–33)
CREATININE: 1.5 mg/dL — AB (ref 0.6–1.2)
Calcium: 8.6 mg/dL (ref 8.0–10.3)
Chloride: 100 mEq/L (ref 98–108)
GLUCOSE: 89 mg/dL (ref 73–118)
Potassium: 3.1 mEq/L — ABNORMAL LOW (ref 3.3–4.7)
SODIUM: 132 meq/L (ref 128–145)
Total Bilirubin: 0.6 mg/dl (ref 0.20–1.60)
Total Protein: 7.2 g/dL (ref 6.4–8.1)

## 2015-07-24 LAB — CBC WITH DIFFERENTIAL (CANCER CENTER ONLY)
BASO#: 0 10*3/uL (ref 0.0–0.2)
BASO%: 0.4 % (ref 0.0–2.0)
EOS ABS: 0.1 10*3/uL (ref 0.0–0.5)
EOS%: 1.3 % (ref 0.0–7.0)
HCT: 37.4 % (ref 34.8–46.6)
HEMOGLOBIN: 12.3 g/dL (ref 11.6–15.9)
LYMPH#: 1.1 10*3/uL (ref 0.9–3.3)
LYMPH%: 21.3 % (ref 14.0–48.0)
MCH: 28.8 pg (ref 26.0–34.0)
MCHC: 32.9 g/dL (ref 32.0–36.0)
MCV: 88 fL (ref 81–101)
MONO#: 0.6 10*3/uL (ref 0.1–0.9)
MONO%: 10.8 % (ref 0.0–13.0)
NEUT#: 3.5 10*3/uL (ref 1.5–6.5)
NEUT%: 66.2 % (ref 39.6–80.0)
PLATELETS: 286 10*3/uL (ref 145–400)
RBC: 4.27 10*6/uL (ref 3.70–5.32)
RDW: 19 % — AB (ref 11.1–15.7)
WBC: 5.4 10*3/uL (ref 3.9–10.0)

## 2015-07-24 LAB — LACTATE DEHYDROGENASE: LDH: 215 U/L (ref 125–245)

## 2015-07-24 MED ORDER — METHYLPREDNISOLONE 4 MG PO TBPK: ORAL_TABLET | ORAL | Status: DC

## 2015-07-24 NOTE — Telephone Encounter (Signed)
[name] constant nausea. She isn't eating or drinking much. She is losing weight. She has constant nausea with dry heaving not resolved with zofran or phenergan. She is not taking any oral chemo agents.  Spoke to Dr Marin Olp and he wants patient to come in to be seen today by the Nurse Practitioner and receive IVF. Appointment made and patient aware.

## 2015-07-24 NOTE — Progress Notes (Signed)
Hematology and Oncology Follow Up Visit  Cathy Lewis [id number] 06-06-1945 70 y.o. 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 She took laxatives and had her last BM on Sunday. She is belching a good bit and does not have much of an appetite. She states that she is staying hydrated. Her weight is down 2 lbs since her last visit.  She has not been taking her Zantac or Bentyl regularly.  She also has a red raised rash on her chest, arms and legs that itches. She has had this for several days.  She does not like to take pain medication as it bothers her stomach and is taking it sparingly. No fever, chills, dizziness, shortness of breath, chest pain, palpitations or changes in bladder habits. No lymphadenopathy found on exam. No episodes of bleeding or bruising.  No syncopal episodes or falls. No swelling, numbness or tingling in her extremities. No c/o joint pain.   Medications:    Medication List       This list is accurate as of: 07/24/15  1:56 PM.  Always use your most recent med list.               acetaminophen 500 MG tablet  Commonly known as:  TYLENOL  Take 1,000 mg by mouth every 6 (six) hours as needed. Reported on 07/24/2015     albuterol 90 MCG/ACT inhaler  Commonly known as:  PROVENTIL,VENTOLIN  Inhale 2 puffs into the lungs every 4 (four) hours as needed. Wheezing     amLODipine 5 MG tablet  Commonly known as:  NORVASC  Take 5 mg by mouth daily.     axitinib 5 MG tablet  Commonly known as:  INLYTA  Take 1 tablet (5 mg total) by mouth 2 (two) times daily.     CALCIUM + D PO  Take 1 tablet by mouth daily.     [facility] GLUCOSAMINE-CHONDROITIN 500-400 MG tablet    Generic drug:  glucosamine-chondroitin  Take by mouth.     dicyclomine 20 MG tablet  Commonly known as:  BENTYL  Take 20 mg by mouth 3 (three) times daily as needed (Stomach Cramps).     hydrochlorothiazide 25 MG tablet  Commonly known as:  HYDRODIURIL  Take 25 mg by mouth daily.     HYDROcodone-acetaminophen 5-325 MG tablet  Commonly known as:  NORCO/VICODIN  Take 1-2 tablets by mouth every 4 (four) hours as needed for moderate pain or severe pain.     HYDROmorphone 2 MG tablet  Commonly known as:  DILAUDID  Take 1 tablet (2 mg total) by mouth every 4 (four) hours as needed for severe pain.     Magnesium 250 MG Tabs  Take 250 mg by mouth daily.     meclizine 25 MG tablet  Commonly known as:  ANTIVERT  Take 12.5 mg by mouth 3 (three) times daily as needed. For dizziness.     mometasone-formoterol 100-5 MCG/ACT Aero  Commonly known as:  DULERA  Take 2 puffs first thing in am and then another 2 puffs about 12 hours later.     montelukast 10 MG tablet  Commonly known as:  SINGULAIR  Take 10 mg  by mouth at bedtime.     ondansetron 8 MG tablet  Commonly known as:  ZOFRAN  Take 0.5 tablets (4 mg total) by mouth every 6 (six) hours as needed for nausea or vomiting.     OSTEO BI-FLEX ADV DOUBLE ST PO  Take 1 tablet by mouth daily.     PRESCRIPTION MEDICATION  every 14 (fourteen) days. Reported on 05/24/2015     PROBIOTIC DAILY PO  Take by mouth.     promethazine 12.5 MG tablet  Commonly known as:  PHENERGAN  Take 12.5 mg by mouth every 6 (six) hours.     ranitidine 150 MG tablet  Commonly known as:  ZANTAC  Take 150 mg by mouth 2 (two) times daily.     trolamine salicylate 10 % cream  Commonly known as:  ASPERCREME  Apply 1 application topically 2 (two) times daily as needed for muscle pain.     vitamin C 500 MG tablet  Commonly known as:  ASCORBIC ACID  Take 500 mg by mouth daily.     XGEVA 120 MG/1.7ML Soln injection  Generic drug:  denosumab  Inject 120 mg  into the skin every 30 (thirty) days.        Allergies:  Allergies  Allergen Reactions  . Azithromycin     Stomach cramps. Green black stool   . Gnp [Acetaminophen]     "GNR" (cough syrup) "GNR" (cough syrup)   . Rofecoxib     Pt unsure   . Tramadol Nausea And Vomiting and Other (See Comments)    dizziness   . Avelox [Moxifloxacin Hcl In Nacl] Palpitations and Other (See Comments)    Body ache   . Etodolac Palpitations and Other (See Comments)    Joints hurt Joints hurt  . Moxifloxacin Other (See Comments) and Palpitations    Body ache    Past Medical History, Surgical history, Social history, and Family History were reviewed and updated.  Review of Systems: All other 10 point review of systems is negative.   Physical Exam:  height is 5\' 5"  (1.651 m) and weight is 124 lb (56.246 kg). Her oral temperature is 98.3 F (36.8 C). Her blood pressure is 110/58 and her pulse is 90. Her respiration is 16.   Wt Readings from Last 3 Encounters:  07/24/15 124 lb (56.246 kg)  06/28/15 126 lb (57.153 kg)  05/29/15 132 lb (59.875 kg)    Ocular: Sclerae unicteric, pupils equal, round and reactive to light Ear-nose-throat: Oropharynx clear, dentition fair Lymphatic: No cervical supraclavicular or axillary adenopathy Lungs no rales or rhonchi, good excursion bilaterally Heart regular rate and rhythm, no murmur appreciated Abd soft, nontender, positive bowel sounds MSK Chronic back pain and tenderness due to metastatic disease, no joint edema Neuro: non-focal, well-oriented, appropriate affect Breasts: Deferred  Lab Results  Component Value Date   WBC 5.4 07/24/2015   HGB 12.3 07/24/2015   HCT 37.4 07/24/2015   MCV 88 07/24/2015   PLT 286 07/24/2015   No results found for: FERRITIN, IRON, TIBC, UIBC, IRONPCTSAT Lab Results  Component Value Date   RBC 4.27 07/24/2015   No results found for: KPAFRELGTCHN, LAMBDASER, KAPLAMBRATIO No results found for: IGGSERUM, IGA,  IGMSERUM No results found for: Cathy Lewis, SPEI   Chemistry      Component Value Date/Time   NA 132 07/24/2015 1305   NA 141 05/29/2015 0850   NA 139 04/05/2015 1030   K 3.1* 07/24/2015 1305  K 3.5 05/29/2015 0850   K 3.6 04/05/2015 1030   CL 100 07/24/2015 1305   CL 102 04/05/2015 1030   CO2 26 07/24/2015 1305   CO2 29 05/29/2015 0850   CO2 27 04/05/2015 1030   BUN 18 07/24/2015 1305   BUN 26.7* 05/29/2015 0850   BUN 23* 04/05/2015 1030   CREATININE 1.5* 07/24/2015 1305   CREATININE 1.8* 05/29/2015 0850   CREATININE 1.29* 04/05/2015 1030   GLU 104 07/20/2008      Component Value Date/Time   CALCIUM 8.6 07/24/2015 1305   CALCIUM 10.6* 05/29/2015 0850   CALCIUM 9.4 04/05/2015 1030   ALKPHOS 64 07/24/2015 1305   ALKPHOS 80 05/29/2015 0850   ALKPHOS 54 04/05/2015 1030   AST 24 07/24/2015 1305   AST 24 05/29/2015 0850   AST 23 04/05/2015 1030   ALT 16 07/24/2015 1305   ALT 18 05/29/2015 0850   ALT 18 04/05/2015 1030   BILITOT 0.60 07/24/2015 1305   BILITOT 0.76 05/29/2015 0850   BILITOT 0.5 04/05/2015 1030     Impression and Plan: Ms. Heinkel is a very pleasant 70 yo white female with metastatic clear cell renal carcinoma diagnosed in 2013. She is was unable to tolerate Inlyta which was stopped at her last visit. She continues to have abdominal cramping and n/v. Both her CT of the head and abdomen/pelvis today were negative for progression of disease, constipation or bowel obstruction.  CBC and CMP look good.  She will start a Medrol dose pack today for her rash.  We will have her try Reglan 10 mg Q6 PRN for the n/v. She will also make sure to take her zantac daily.  She sees [name] on Friday and will discuss starting treatment with Cabometyx.  She knows to contact us with any questions or concerns. We can certainly see her sooner if need be.   Eliezer Bottom, NP 5/30/20171:56 PM

## 2015-07-25 LAB — PREALBUMIN: PREALBUMIN: 33 mg/dL — AB (ref 9–32)

## 2015-07-25 MED ORDER — METOCLOPRAMIDE HCL 10 MG PO TABS: 10.0000 mg | ORAL_TABLET | Freq: Four times a day (QID) | ORAL | Status: DC

## 2015-07-25 MED ORDER — METOCLOPRAMIDE HCL 10 MG PO TABS: 10.0000 mg | ORAL_TABLET | Freq: Four times a day (QID) | ORAL | Status: DC | PRN

## 2015-07-27 ENCOUNTER — Other Ambulatory Visit: Payer: Medicare PPO

## 2015-07-27 ENCOUNTER — Ambulatory Visit (HOSPITAL_BASED_OUTPATIENT_CLINIC_OR_DEPARTMENT_OTHER): Payer: Medicare PPO | Admitting: Hematology & Oncology

## 2015-07-27 ENCOUNTER — Ambulatory Visit (HOSPITAL_BASED_OUTPATIENT_CLINIC_OR_DEPARTMENT_OTHER): Payer: Medicare PPO

## 2015-07-27 VITALS — BP 107/58 | HR 86 | Temp 98.6°F | Resp 18

## 2015-07-27 DIAGNOSIS — R110 Nausea: Secondary | ICD-10-CM | POA: Diagnosis not present

## 2015-07-27 DIAGNOSIS — M545 Low back pain: Secondary | ICD-10-CM

## 2015-07-27 DIAGNOSIS — C649 Malignant neoplasm of unspecified kidney, except renal pelvis: Secondary | ICD-10-CM

## 2015-07-27 DIAGNOSIS — C7951 Secondary malignant neoplasm of bone: Secondary | ICD-10-CM

## 2015-07-27 MED ORDER — SODIUM CHLORIDE 0.9 % IV SOLN
20.0000 mg | Freq: Once | INTRAVENOUS | Status: AC
  Administered 2015-07-27: 20 mg via INTRAVENOUS
  Filled 2015-07-27: qty 2

## 2015-07-27 MED ORDER — METOCLOPRAMIDE HCL 5 MG/ML IJ SOLN
20.0000 mg | Freq: Once | INTRAVENOUS | Status: AC
  Administered 2015-07-27: 20 mg via INTRAVENOUS
  Filled 2015-07-27: qty 4

## 2015-07-27 MED ORDER — METOCLOPRAMIDE HCL 5 MG/ML IJ SOLN
INTRAMUSCULAR | Status: AC
  Filled 2015-07-27: qty 2

## 2015-07-27 MED ORDER — FENTANYL 12 MCG/HR TD PT72: 25.0000 ug | MEDICATED_PATCH | TRANSDERMAL | Status: DC

## 2015-07-27 MED ORDER — DENOSUMAB 120 MG/1.7ML ~~LOC~~ SOLN
120.0000 mg | Freq: Once | SUBCUTANEOUS | Status: AC
  Administered 2015-07-27: 120 mg via SUBCUTANEOUS
  Filled 2015-07-27: qty 1.7

## 2015-07-27 MED ORDER — SODIUM CHLORIDE 0.9 % IV SOLN
INTRAVENOUS | Status: AC
Start: 2015-07-27 — End: 2015-07-27
  Administered 2015-07-27: 13:00:00 via INTRAVENOUS

## 2015-07-27 NOTE — Patient Instructions (Signed)

## 2015-07-30 NOTE — Progress Notes (Signed)
Hematology and Oncology Follow Up Visit  Cathy Lewis [id number] 23-Dec-1945 70 y.o. 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  She is now been off Inlyta for a month. She has been having issues with nausea and vomiting. We did some abdominal CT scans on her. This did not show any obstruction or ileus. We did a brain CT scan. This did not show any metastasis. She was given some Reglan which she is not taking. I think she is orally on some antiacids.  She's lost some weight.  Her back pain continues. She's had radiation to the back. I think she has had a kyphoplasty as of yet. I suppose this could be an option.  She has this rash. Again I'm not sure as to why she has this. Maybe is some type of reaction or paraneoplastic effect from her tumor. I don't think she needs a biopsy.  Is no cough. His no shortness of breath. She has some dehydration.  We are given her Xgeva.   Overall, her performance status is ECOG 2-3.    Medications:    Medication List       This list is accurate as of: 07/27/15 11:59 PM.  Always use your most recent med list.               acetaminophen 500 MG tablet  Commonly known as:  TYLENOL  Take 1,000 mg by mouth every 6 (six) hours as needed. Reported on 07/24/2015     albuterol 90 MCG/ACT inhaler  Commonly known as:  PROVENTIL,VENTOLIN  Inhale 2 puffs into the lungs every 4 (four) hours as needed. Wheezing     CALCIUM + D PO  Take 1 tablet by mouth daily.     [facility] GLUCOSAMINE-CHONDROITIN 500-400 MG tablet  Generic drug:  glucosamine-chondroitin  Take by mouth.     dicyclomine 20 MG tablet    Commonly known as:  BENTYL  Take 20 mg by mouth 3 (three) times daily as needed (Stomach Cramps).     fentaNYL 12 MCG/HR  Commonly known as:  DURAGESIC - dosed mcg/hr  Place 2 patches (25 mcg total) onto the skin every 3 (three) days.     HYDROcodone-acetaminophen 5-325 MG tablet  Commonly known as:  NORCO/VICODIN  Take 1-2 tablets by mouth every 4 (four) hours as needed for moderate pain or severe pain.     Magnesium 250 MG Tabs  Take 250 mg by mouth daily.     meclizine 25 MG tablet  Commonly known as:  ANTIVERT  Take 12.5 mg by mouth 3 (three) times daily as needed. For dizziness.     methylPREDNISolone 4 MG Tbpk tablet  Commonly known as:  MEDROL DOSEPAK  Take as directed on package.     metoCLOPramide 10 MG tablet  Commonly known as:  REGLAN  Take 1 tablet (10 mg total) by mouth every 6 (six) hours as needed for nausea.     mometasone-formoterol 100-5 MCG/ACT Aero  Commonly known as:  DULERA  Take 2 puffs first thing in am and then another 2 puffs about 12 hours later.  montelukast 10 MG tablet  Commonly known as:  SINGULAIR  Take 10 mg by mouth at bedtime.     ondansetron 8 MG tablet  Commonly known as:  ZOFRAN  Take 0.5 tablets (4 mg total) by mouth every 6 (six) hours as needed for nausea or vomiting.     OSTEO BI-FLEX ADV DOUBLE ST PO  Take 1 tablet by mouth daily.     PRESCRIPTION MEDICATION  every 14 (fourteen) days. Reported on 05/24/2015     PROBIOTIC DAILY PO  Take by mouth.     promethazine 12.5 MG tablet  Commonly known as:  PHENERGAN  Take 12.5 mg by mouth every 6 (six) hours.     ranitidine 150 MG tablet  Commonly known as:  ZANTAC  Take 150 mg by mouth 2 (two) times daily.     trolamine salicylate 10 % cream  Commonly known as:  ASPERCREME  Apply 1 application topically 2 (two) times daily as needed for muscle pain.     vitamin C 500 MG tablet  Commonly known as:  ASCORBIC ACID  Take 500 mg by mouth daily.     XGEVA 120 MG/1.7ML  Soln injection  Generic drug:  denosumab  Inject 120 mg into the skin every 30 (thirty) days.        Allergies:  Allergies  Allergen Reactions  . Azithromycin     Stomach cramps. Green black stool   . Gnp [Acetaminophen]     "GNR" (cough syrup) "GNR" (cough syrup)   . Rofecoxib     Pt unsure   . Tramadol Nausea And Vomiting and Other (See Comments)    dizziness   . Avelox [Moxifloxacin Hcl In Nacl] Palpitations and Other (See Comments)    Body ache   . Etodolac Palpitations and Other (See Comments)    Joints hurt Joints hurt  . Moxifloxacin Other (See Comments) and Palpitations    Body ache    Past Medical History, Surgical history, Social history, and Family History were reviewed and updated.  Review of Systems: All other 10 point review of systems is negative.   Physical Exam:  oral temperature is 98.6 F (37 C). Her blood pressure is 107/58 and her pulse is 86. Her respiration is 18.   Wt Readings from Last 3 Encounters:  07/24/15 124 lb (56.246 kg)  06/28/15 126 lb (57.153 kg)  05/29/15 132 lb (59.875 kg)    Well-developed well-nourished white female in no obvious distress. Head exam shows no ocular or oral lesions. There are no palpable cervical or supraclavicular lymph nodes. Lungs are clear. She has some slight wheezing in the central portion of the lungs. She has no rhonchi. Cardiac exam regular rate and rhythm with no murmurs, rubs or bruits. Abdomen is soft. She has the healing hernia incision in the right lower quadrant. There is no erythema surrounding this. There is no palpable fluid wave. There is no palpable abdominal mass. There is no palpable liver or spleen tip. Extremities shows no clubbing, cyanosis or edema. Neurological exam shows no focal neurological deficits. Skin exam shows no rashes, ecchymoses or petechia.  Lab Results  Component Value Date   WBC 5.4 07/24/2015   HGB 12.3 07/24/2015   HCT 37.4 07/24/2015   MCV 88 07/24/2015   PLT 286  07/24/2015   No results found for: FERRITIN, IRON, TIBC, UIBC, IRONPCTSAT Lab Results  Component Value Date   RBC 4.27 07/24/2015   No results found for: KPAFRELGTCHN, LAMBDASER, KAPLAMBRATIO No results  found for: Kandis Cocking, IGMSERUM No results found for: Kathrynn Ducking, MSPIKE, SPEI   Chemistry      Component Value Date/Time   NA 132 07/24/2015 1305   NA 141 05/29/2015 0850   NA 139 04/05/2015 1030   K 3.1* 07/24/2015 1305   K 3.5 05/29/2015 0850   K 3.6 04/05/2015 1030   CL 100 07/24/2015 1305   CL 102 04/05/2015 1030   CO2 26 07/24/2015 1305   CO2 29 05/29/2015 0850   CO2 27 04/05/2015 1030   BUN 18 07/24/2015 1305   BUN 26.7* 05/29/2015 0850   BUN 23* 04/05/2015 1030   CREATININE 1.5* 07/24/2015 1305   CREATININE 1.8* 05/29/2015 0850   CREATININE 1.29* 04/05/2015 1030   GLU 104 07/20/2008      Component Value Date/Time   CALCIUM 8.6 07/24/2015 1305   CALCIUM 10.6* 05/29/2015 0850   CALCIUM 9.4 04/05/2015 1030   ALKPHOS 64 07/24/2015 1305   ALKPHOS 80 05/29/2015 0850   ALKPHOS 54 04/05/2015 1030   AST 24 07/24/2015 1305   AST 24 05/29/2015 0850   AST 23 04/05/2015 1030   ALT 16 07/24/2015 1305   ALT 18 05/29/2015 0850   ALT 18 04/05/2015 1030   BILITOT 0.60 07/24/2015 1305   BILITOT 0.76 05/29/2015 0850   BILITOT 0.5 04/05/2015 1030     Impression and Plan: Ms. Rajkumar is a very pleasant 70 yo white female with metastatic clear cell renal carcinoma diagnosed in 2013.   For me, I think the next option for therapy would be Cabometyx. There've been some studies that have shown that this seems to have a fairly good effect in patients who have bone metastasis.  My only concern is that I am not sure she is able to take it right now. I do still think she is strong enough. Even though it is a oral therapy, he does have side effects which could further compromise her quality of life.  I want to try her on a fentanyl  patch. Maybe this will help provide a little bit better pain control for her.  I suppose that kyphoplasty or vertebroplasty might be considered an option for her back if we cannot give the back pain better.  She has some elements of dehydration. I will go ahead and give her some IV fluids today.. I'll give her some IV Reglan and some IV Protonix to try to help with her nausea.  Surprisingly enough, her last prealbumin done last week was 33 which is better than I would've thought.  I want to see her back in 3 weeks. At that point time, very weak and is started on some oral therapy with Cabometyx.  I spent about 40 minutes with her today.   Volanda Napoleon, MD 6/5/20177:26 AM

## 2015-07-31 ENCOUNTER — Encounter: Payer: Self-pay | Admitting: *Deleted

## 2015-08-03 ENCOUNTER — Other Ambulatory Visit: Payer: Self-pay | Admitting: Hematology & Oncology

## 2015-08-03 ENCOUNTER — Other Ambulatory Visit: Payer: Self-pay | Admitting: Family

## 2015-08-03 DIAGNOSIS — C649 Malignant neoplasm of unspecified kidney, except renal pelvis: Secondary | ICD-10-CM

## 2015-08-03 DIAGNOSIS — C7951 Secondary malignant neoplasm of bone: Principal | ICD-10-CM

## 2015-08-03 MED ORDER — CABOZANTINIB S-MALATE 40 MG PO TABS: 40.0000 mg | ORAL_TABLET | Freq: Every day | ORAL | Status: DC

## 2015-08-06 ENCOUNTER — Telehealth: Payer: Self-pay | Admitting: *Deleted

## 2015-08-06 NOTE — Telephone Encounter (Signed)
[name] back pain that has been increasing for about one week. She saw her PCP last week who did a basic workup - physical assessment and UA which came back negative. She is also c/o indigestion stating that she burps constantly, all day, even with Zantac 150mg  BID.  When asked what she is taking for pain, she stated she was taking tylenol. She also stated that she has only been using one fentanyl patch, when Dr Antonieta Pert prescription states to use two.  Discussed issues with Dr Marin Olp. He would like patient to take OTC gas medication such as Gax-ex or Gaviscon. He also wants patient to use two pain patches a prescribed.  Reviewed both with patient and she understood. She has an appointment scheduled in this office this Thursday. We will follow up with her symptoms then.

## 2015-08-07 MED FILL — *CABOMETYX 40 MG TABLET: 40 | 30 days supply | Qty: 30 | Fill #0

## 2015-08-09 ENCOUNTER — Ambulatory Visit (HOSPITAL_BASED_OUTPATIENT_CLINIC_OR_DEPARTMENT_OTHER): Payer: Medicare PPO | Admitting: Hematology & Oncology

## 2015-08-09 ENCOUNTER — Ambulatory Visit (HOSPITAL_BASED_OUTPATIENT_CLINIC_OR_DEPARTMENT_OTHER)
Admission: RE | Admit: 2015-08-09 | Discharge: 2015-08-09 | Disposition: A | Discharge status: Home / Self Care | Payer: Medicare PPO | Source: Ambulatory Visit | Attending: Hematology & Oncology | Admitting: Hematology & Oncology

## 2015-08-09 ENCOUNTER — Encounter: Payer: Self-pay | Admitting: Hematology & Oncology

## 2015-08-09 ENCOUNTER — Telehealth: Payer: Self-pay | Admitting: Emergency Medicine

## 2015-08-09 ENCOUNTER — Other Ambulatory Visit: Payer: Self-pay | Admitting: Family

## 2015-08-09 ENCOUNTER — Other Ambulatory Visit (HOSPITAL_BASED_OUTPATIENT_CLINIC_OR_DEPARTMENT_OTHER): Payer: Medicare PPO

## 2015-08-09 ENCOUNTER — Ambulatory Visit: Payer: Medicare PPO

## 2015-08-09 VITALS — BP 103/61 | HR 91 | Temp 97.6°F | Resp 18 | Ht 65.0 in | Wt 124.0 lb

## 2015-08-09 DIAGNOSIS — C7951 Secondary malignant neoplasm of bone: Secondary | ICD-10-CM

## 2015-08-09 DIAGNOSIS — R109 Unspecified abdominal pain: Secondary | ICD-10-CM

## 2015-08-09 DIAGNOSIS — C649 Malignant neoplasm of unspecified kidney, except renal pelvis: Secondary | ICD-10-CM | POA: Insufficient documentation

## 2015-08-09 DIAGNOSIS — K819 Cholecystitis, unspecified: Secondary | ICD-10-CM

## 2015-08-09 LAB — CMP (CANCER CENTER ONLY)
ALBUMIN: 3.2 g/dL — AB (ref 3.3–5.5)
ALK PHOS: 63 U/L (ref 26–84)
ALT: 20 U/L (ref 10–47)
AST: 23 U/L (ref 11–38)
BILIRUBIN TOTAL: 0.7 mg/dL (ref 0.20–1.60)
BUN, Bld: 16 mg/dL (ref 7–22)
CALCIUM: 8.9 mg/dL (ref 8.0–10.3)
CO2: 23 mEq/L (ref 18–33)
Chloride: 108 mEq/L (ref 98–108)
Creat: 1.4 mg/dl — ABNORMAL HIGH (ref 0.6–1.2)
Glucose, Bld: 85 mg/dL (ref 73–118)
POTASSIUM: 4.1 meq/L (ref 3.3–4.7)
Sodium: 132 mEq/L (ref 128–145)
TOTAL PROTEIN: 6.1 g/dL — AB (ref 6.4–8.1)

## 2015-08-09 LAB — CBC WITH DIFFERENTIAL (CANCER CENTER ONLY)
BASO#: 0 10*3/uL (ref 0.0–0.2)
BASO%: 0.6 % (ref 0.0–2.0)
EOS%: 2.5 % (ref 0.0–7.0)
Eosinophils Absolute: 0.2 10*3/uL (ref 0.0–0.5)
HEMATOCRIT: 35.7 % (ref 34.8–46.6)
HEMOGLOBIN: 11.3 g/dL — AB (ref 11.6–15.9)
LYMPH#: 1.1 10*3/uL (ref 0.9–3.3)
LYMPH%: 16.2 % (ref 14.0–48.0)
MCH: 28.5 pg (ref 26.0–34.0)
MCHC: 31.7 g/dL — ABNORMAL LOW (ref 32.0–36.0)
MCV: 90 fL (ref 81–101)
MONO#: 0.7 10*3/uL (ref 0.1–0.9)
MONO%: 9.8 % (ref 0.0–13.0)
NEUT%: 70.9 % (ref 39.6–80.0)
NEUTROS ABS: 4.8 10*3/uL (ref 1.5–6.5)
Platelets: 239 10*3/uL (ref 145–400)
RBC: 3.97 10*6/uL (ref 3.70–5.32)
RDW: 18.8 % — AB (ref 11.1–15.7)
WBC: 6.7 10*3/uL (ref 3.9–10.0)

## 2015-08-09 LAB — LACTATE DEHYDROGENASE: LDH: 185 U/L (ref 125–245)

## 2015-08-09 NOTE — Telephone Encounter (Signed)
Spoke with patient; gave her date and time for appointment with Dr Zella Richer on 6/23 at 0830

## 2015-08-09 NOTE — Progress Notes (Signed)
Hematology and Oncology Follow Up Visit  Cathy Lewis [id number] [birth year] 70 y.o. 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  She actually looks better. She's not complaining of is much pain.  She is complaining of some abdominal pain. This is in the right upper quadrant. It seems to be worse after she eats. We went ahead and got an ultrasound of her abdomen. It showed that she had a slight thickened gallbladder wall. She had a obstructive nodule at the neck. Is not sure if it's a stone or a polyp.  She has not yet started the Cabometyx .  I thought this might be a good idea for her since this seems to have some activity with bony metastasis.  The problem now is on no she has gallbladder problems. If she has a gallbladder issue and these have her gallbladder taken out, I would not want her on the Cabometyx.  She has had no cough. She has had no headache. She has had no problems with bowels or bladder.  She's certainly is not dehydrated sidle thing we have to do any fluids on her today.   She also thinks that drinking diet soda was causing some of her problems. She stopped drinking diet soda.   Overall, her performance status is ECOG 1    Medications:    Medication List       This list is accurate as of: 08/09/15  5:52 PM.  Always use your most recent med list.               acetaminophen 500 MG tablet  Commonly known as:  TYLENOL  Take 1,000 mg by mouth every 6 (six) hours as needed. Reported on 07/24/2015     ADVAIR DISKUS 100-50 MCG/DOSE Aepb  Generic drug:   Fluticasone-Salmeterol  Inhale 1 puff into the lungs 2 (two) times daily before a meal.     albuterol 90 MCG/ACT inhaler  Commonly known as:  PROVENTIL,VENTOLIN  Inhale 2 puffs into the lungs every 4 (four) hours as needed. Wheezing     amLODipine 5 MG tablet  Commonly known as:  NORVASC     cabozantinib S-Malate 40 MG Tabs  Commonly known as:  CABOMETYX  Take 40 mg by mouth daily.     CALCIUM + D PO  Take 1 tablet by mouth daily.     [facility] GLUCOSAMINE-CHONDROITIN 500-400 MG tablet  Generic drug:  glucosamine-chondroitin  Take by mouth.     dicyclomine 20 MG tablet  Commonly known as:  BENTYL  Take 20 mg by mouth 3 (three) times daily as needed (Stomach Cramps).     fentaNYL 12 MCG/HR  Commonly known as:  DURAGESIC - dosed mcg/hr  Place 2 patches (25 mcg total) onto the skin every 3 (three) days.     HYDROcodone-acetaminophen 5-325 MG tablet  Commonly known as:  NORCO/VICODIN  Take 1-2 tablets by mouth every 4 (four) hours as needed for moderate pain or severe pain.     LACTOBACILLUS PO  Take by mouth.     Magnesium 250  MG Tabs  Take 250 mg by mouth daily.     meclizine 25 MG tablet  Commonly known as:  ANTIVERT  Take 12.5 mg by mouth 3 (three) times daily as needed. For dizziness.     methylPREDNISolone 4 MG Tbpk tablet  Commonly known as:  MEDROL DOSEPAK  Take as directed on package.     metoCLOPramide 10 MG tablet  Commonly known as:  REGLAN  Take 1 tablet (10 mg total) by mouth every 6 (six) hours as needed for nausea.     montelukast 10 MG tablet  Commonly known as:  SINGULAIR  Take 10 mg by mouth at bedtime.     ondansetron 8 MG tablet  Commonly known as:  ZOFRAN  Take 0.5 tablets (4 mg total) by mouth every 6 (six) hours as needed for nausea or vomiting.     OSTEO BI-FLEX ADV DOUBLE ST PO  Take 1 tablet by mouth daily.     PRESCRIPTION MEDICATION  every 14 (fourteen) days. Reported on 05/24/2015     PROBIOTIC DAILY PO  Take by mouth.      promethazine 12.5 MG tablet  Commonly known as:  PHENERGAN  Take 12.5 mg by mouth every 6 (six) hours.     ranitidine 150 MG tablet  Commonly known as:  ZANTAC  Take 150 mg by mouth 2 (two) times daily.     trolamine salicylate 10 % cream  Commonly known as:  ASPERCREME  Apply 1 application topically 2 (two) times daily as needed for muscle pain.     vitamin C 500 MG tablet  Commonly known as:  ASCORBIC ACID  Take 500 mg by mouth daily.     XGEVA 120 MG/1.7ML Soln injection  Generic drug:  denosumab  Inject 120 mg into the skin every 30 (thirty) days.        Allergies:  Allergies  Allergen Reactions  . Azithromycin     Stomach cramps. Green black stool   . Gnp [Acetaminophen]     "GNR" (cough syrup) "GNR" (cough syrup)   . Rofecoxib     Pt unsure   . Tramadol Nausea And Vomiting and Other (See Comments)    dizziness   . Avelox [Moxifloxacin Hcl In Nacl] Palpitations and Other (See Comments)    Body ache   . Etodolac Palpitations and Other (See Comments)    Joints hurt Joints hurt  . Moxifloxacin Other (See Comments) and Palpitations    Body ache    Past Medical History, Surgical history, Social history, and Family History were reviewed and updated.  Review of Systems: All other 10 point review of systems is negative.   Physical Exam:  height is _0  (1.651 m) and weight is 124 lb (56.246 kg). Her oral temperature is 97.6 F (36.4 C). Her blood pressure is 103/61 and her pulse is 91. Her respiration is 18.   Wt Readings from Last 3 Encounters:  08/09/15 124 lb (56.246 kg)  07/24/15 124 lb (56.246 kg)  06/28/15 126 lb (57.153 kg)    Well-developed well-nourished white female in no obvious distress. Head exam shows no ocular or oral lesions. There are no palpable cervical or supraclavicular lymph nodes. Lungs are clear. She has some slight wheezing in the central portion of the lungs. She has no rhonchi. Cardiac exam regular rate and rhythm with no murmurs,  rubs or bruits. Abdomen is soft. She has the healing hernia incision in the right lower quadrant. There is no erythema surrounding  this. There is no palpable fluid wave. There is no palpable abdominal mass. There is no palpable liver or spleen tip. Extremities shows no clubbing, cyanosis or edema. Neurological exam shows no focal neurological deficits. Skin exam shows no rashes, ecchymoses or petechia.  Lab Results  Component Value Date   WBC 6.7 08/09/2015   HGB 11.3* 08/09/2015   HCT 35.7 08/09/2015   MCV 90 08/09/2015   PLT 239 08/09/2015   No results found for: FERRITIN, IRON, TIBC, UIBC, IRONPCTSAT Lab Results  Component Value Date   RBC 3.97 08/09/2015   No results found for: KPAFRELGTCHN, LAMBDASER, KAPLAMBRATIO No results found for: Kandis Cocking, IGMSERUM No results found for: Odetta Pink, SPEI   Chemistry      Component Value Date/Time   NA 132 08/09/2015 1103   NA 141 05/29/2015 0850   NA 139 04/05/2015 1030   K 4.1 08/09/2015 1103   K 3.5 05/29/2015 0850   K 3.6 04/05/2015 1030   CL 108 08/09/2015 1103   CL 102 04/05/2015 1030   CO2 23 08/09/2015 1103   CO2 29 05/29/2015 0850   CO2 27 04/05/2015 1030   BUN 16 08/09/2015 1103   BUN 26.7* 05/29/2015 0850   BUN 23* 04/05/2015 1030   CREATININE 1.4* 08/09/2015 1103   CREATININE 1.8* 05/29/2015 0850   CREATININE 1.29* 04/05/2015 1030   GLU 104 07/20/2008      Component Value Date/Time   CALCIUM 8.9 08/09/2015 1103   CALCIUM 10.6* 05/29/2015 0850   CALCIUM 9.4 04/05/2015 1030   ALKPHOS 63 08/09/2015 1103   ALKPHOS 80 05/29/2015 0850   ALKPHOS 54 04/05/2015 1030   AST 23 08/09/2015 1103   AST 24 05/29/2015 0850   AST 23 04/05/2015 1030   ALT 20 08/09/2015 1103   ALT 18 05/29/2015 0850   ALT 18 04/05/2015 1030   BILITOT 0.70 08/09/2015 1103   BILITOT 0.76 05/29/2015 0850   BILITOT 0.5 04/05/2015 1030     Impression and Plan: Ms. Sassaman is a very  pleasant 70 yo white female with metastatic clear cell renal carcinoma diagnosed in 2013.   For me, I think That we have to get the issue with the gallbladder sorted out. She does see Dr. Zella Richer of surgery. She will get to see him next week. We will get a HIDA scan on her. This could tell us about cholecystitis.  Again, she'll hold off on the Cabometyx for right now.  I will let her pain is doing better.  I probably want to get her back in a couple of weeks. I'm sure that we will hear from the surgeons as to what they feel is appropriate for her old bladder. I think if she needs to have her gallbladder taken out, she is in good enough shape to have this done.  I spent about 35 minutes with her today.    Volanda Napoleon, MD 6/15/20175:52 PM

## 2015-08-09 NOTE — Progress Notes (Signed)
No treatment needed per Dr Ennever.  

## 2015-08-10 LAB — PREALBUMIN: PREALBUMIN: 21 mg/dL (ref 9–32)

## 2015-08-15 ENCOUNTER — Encounter: Payer: Self-pay | Admitting: *Deleted

## 2015-08-16 ENCOUNTER — Encounter (HOSPITAL_COMMUNITY)
Admission: RE | Admit: 2015-08-16 | Discharge: 2015-08-16 | Disposition: A | Discharge status: Home / Self Care | Payer: Medicare PPO | Source: Ambulatory Visit | Attending: Family | Admitting: Family

## 2015-08-16 DIAGNOSIS — K819 Cholecystitis, unspecified: Secondary | ICD-10-CM | POA: Insufficient documentation

## 2015-08-16 MED ORDER — TECHNETIUM TC 99M MEBROFENIN IV KIT
5.0000 | PACK | Freq: Once | INTRAVENOUS | Status: AC | PRN
  Administered 2015-08-16: 5 via INTRAVENOUS

## 2015-08-17 ENCOUNTER — Ambulatory Visit: Payer: Medicare PPO | Admitting: Hematology & Oncology

## 2015-08-17 ENCOUNTER — Ambulatory Visit: Payer: Medicare PPO

## 2015-08-17 ENCOUNTER — Other Ambulatory Visit: Payer: Medicare PPO

## 2015-08-17 ENCOUNTER — Ambulatory Visit: Payer: Self-pay | Admitting: General Surgery

## 2015-08-17 NOTE — H&P (Signed)
Cathy Lewis 08/17/2015 8:46 AM Location: [facility] Patient #: [id number] DOB: Jan 20, 1946 Married / Language: English / Race: White Female  History of Present Illness Cathy Hollingshead MD; 08/17/2015 9:28 AM) Patient words: biliary dyskinesia.  The patient is a 70 year old female.   Note:She is referred by [name] because of right upper quadrant pain and biliary dyskinesia. Approximately 2-3 months ago she started developing pain in the right upper quadrant that radiated to her back and was associated with nausea. Seems to be made worse with eating. Ultrasound of the gallbladder did not demonstrate gallstones but did demonstrate a small polyp near the neck of the gallbladder. Nuclear medicine hepatobiliary scan demonstrated a abnormally low gallbladder ejection fraction consistent with biliary dyskinesia. They thought the chemotherapy pill for her kidney cancer was causing her pain but she has been AND continues to have the pain. She states she is lost weight. Liver function tests done recently are normal.  Of note is that she has had an umbilical hernia repair with mesh in the past.  Allergies Jeralyn Ruths, CMA; 08/17/2015 8:53 AM) TraMADol HCl *ANALGESICS - OPIOID* Azithromycin *CHEMICALS* Etodolac *ANALGESICS - ANTI-INFLAMMATORY* Avelox *FLUOROQUINOLONES*  Medication History Jeralyn Ruths, CMA; 08/17/2015 8:53 AM) FentaNYL (12MCG/HR Patch 72HR, Transdermal as directed) Active. Ocdiva (daily) Active. Advair Diskus (100-50MCG/DOSE Aero Pow Br Act, Inhalation) Active. AmLODIPine Besylate (5MG  Tablet, Oral) Active. Dicyclomine HCl (20MG  Tablet, Oral) Active. Hydrochlorothiazide (25MG  Tablet, Oral) Active. Meclizine HCl (12.5MG  Tablet, Oral) Active. Meloxicam (7.5MG  Tablet, Oral) Active. Montelukast Sodium (10MG  Tablet, Oral) Active. Ranitidine HCl (150MG  Tablet, Oral) Active. Calcium 600 (1500MG  Tablet, Oral) Active. Magnesium Gluconate  (250MG  Tablet, Oral) Active. Ventolin HFA (108 (90 Base)MCG/ACT Aerosol Soln, Inhalation) Active. Fluticasone Propionate (50MCG/ACT Suspension, Nasal) Active. Vitamin D3 (1000UNIT Capsule, Oral) Active. Probiotic (Oral) Active. Xgeva (120MG /1.7ML Solution, Subcutaneous) Active.     Review of Systems Cathy Hollingshead MD; 08/17/2015 9:21 AM)  Note: No fever or chills.  Right upper quadrant pain with some nausea and occasional dry heaves.  Does have weight loss.   Vitals Jearld Fenton Morris CMA; 08/17/2015 8:50 AM) 08/17/2015 8:49 AM Weight: 124.4 lb Height: 65in Body Surface Area: 1.62 m Body Mass Index: 20.7 kg/m  Temp.: 98.61F(Oral)  Pulse: 96 (Regular)  BP: 98/60 (Sitting, Left Arm, Standard)      Physical Exam Cathy Hollingshead MD; 08/17/2015 9:23 AM)  The physical exam findings are as follows: Note:General: WDWN in NAD. Pleasant and cooperative.  HEENT: [place]/AT, no facial masses  EYES: no icterus  CV: RRR  CHEST: Breath sounds equal and clear. Respirations nonlabored.   ABDOMEN: Soft, mild right upper quadrant tenderness, small periumbilical incision, no masses.  GU: Right groin scar was small area of firmness underneath the scar that does not fluctuate with a Valsalva maneuver.  MUSCULOSKELETAL: Some lower extremity muscular wasting  SKIN: No jaundice.  NEUROLOGIC: Alert and oriented, answers questions appropriately.  PSYCHIATRIC: Normal mood, affect , and behavior.    Assessment & Plan Cathy Hollingshead MD; 08/17/2015 9:26 AM)  BILIARY DYSKINESIA (K82.8) Impression: We discussed treatment for this including trying a very restricted diet or proceeding to laparoscopic cholecystectomy with cholangiogram. She would like to proceed with the latter. Discussed what the success rate was in most people with this disease and she understands that.  Plan: Laparoscopic cholecystectomy with cholangiogram. I have explained the procedure, risks,  and aftercare of cholecystectomy. Risks include but are not limited to bleeding, infection, wound problems, anesthesia, diarrhea, bile leak, injury to  common bile duct/liver/intestine. She seems to understand and agrees to proceed.  [name], MD

## 2015-08-21 NOTE — Pre-Procedure Instructions (Signed)
Cathy Lewis  08/21/2015      [facility] - [place], [place] [address] Phone: [phone] Fax: [phone]  [id number] [place], [place] - [address] [place] [address] Phone: [phone] Fax: [phone]    Your procedure is scheduled on Monday, July, 3, 2017   Report to [facility] Admitting at 8:30 A.M.   Call this number if you have problems the morning of surgery:  [phone]   Remember:  Do not eat food or drink liquids after midnight.  Take these medicines the morning of surgery with A SIP OF WATER: acetaminophen (tylenol) if needed, amlodipine (norvasc), cabometyx, Advair diskus, hydrodone-acetaminophen (Norco/vicodin) if needed, ranitidine (zantac)  7 days prior to surgery STOP taking any Aspirin, Aleve, Naproxen, Ibuprofen, Motrin, Advil, Goody's, BC's, all herbal medications, fish oil, and all vitamins    Do not wear jewelry, make-up or nail polish.  Do not wear lotions, powders, or perfumes.  You may wear deoderant.  Do not shave 48 hours prior to surgery.  Men may shave face and neck.  Do not bring valuables to the hospital.  [facility] is not responsible for any belongings or valuables.  Contacts, dentures or bridgework may not be worn into surgery.  Leave your suitcase in the car.  After surgery it may be brought to your room.  For patients admitted to the hospital, discharge time will be determined by your treatment team.  Patients discharged the day of surgery will not be allowed to drive home.    Special instructions:    [place]- Preparing For Surgery  Before surgery, you can play an important role. Because skin is not sterile, your skin needs to be as free of germs as possible. You can reduce the number of germs on your skin by washing with CHG (chlorahexidine gluconate) Soap before  surgery.  CHG is an antiseptic cleaner which kills germs and bonds with the skin to continue killing germs even after washing.  Please do not use if you have an allergy to CHG or antibacterial soaps. If your skin becomes reddened/irritated stop using the CHG.  Do not shave (including legs and underarms) for at least 48 hours prior to first CHG shower. It is OK to shave your face.  Please follow these instructions carefully.   1. Shower the NIGHT BEFORE SURGERY and the MORNING OF SURGERY with CHG.   2. If you chose to wash your hair, wash your hair first as usual with your normal shampoo.  3. After you shampoo, rinse your hair and body thoroughly to remove the shampoo.  4. Use CHG as you would any other liquid soap. You can apply CHG directly to the skin and wash gently with a scrungie or a clean washcloth.   5. Apply the CHG Soap to your body ONLY FROM THE NECK DOWN.  Do not use on open wounds or open sores. Avoid contact with your eyes, ears, mouth and genitals (private parts). Wash genitals (private parts) with your normal soap.  6. Wash thoroughly, paying special attention to the area where your surgery will be performed.  7. Thoroughly rinse your body with warm water from the neck down.  8. DO NOT shower/wash with your normal soap after using and rinsing off the CHG Soap.  9. Pat yourself dry with a CLEAN  TOWEL.   10. Wear CLEAN PAJAMAS   11. Place CLEAN SHEETS on your bed the night of your first shower and DO NOT SLEEP WITH PETS.    Day of Surgery: Do not apply any deodorants/lotions. Please wear clean clothes to the hospital/surgery center.      Please read over the following fact sheets that you were given. Pain Booklet, Coughing and Deep Breathing and Surgical Site Infection Prevention

## 2015-08-22 ENCOUNTER — Encounter (HOSPITAL_COMMUNITY)
Admission: RE | Admit: 2015-08-22 | Discharge: 2015-08-22 | Disposition: A | Discharge status: Home / Self Care | Payer: Medicare PPO | Source: Ambulatory Visit | Attending: General Surgery | Admitting: General Surgery

## 2015-08-22 ENCOUNTER — Encounter (HOSPITAL_COMMUNITY): Payer: Self-pay

## 2015-08-22 DIAGNOSIS — K828 Other specified diseases of gallbladder: Secondary | ICD-10-CM | POA: Diagnosis not present

## 2015-08-22 DIAGNOSIS — Z01812 Encounter for preprocedural laboratory examination: Secondary | ICD-10-CM | POA: Diagnosis not present

## 2015-08-22 HISTORY — DX: Headache, unspecified: R51.9

## 2015-08-22 HISTORY — DX: Major depressive disorder, single episode, unspecified: F32.9

## 2015-08-22 HISTORY — DX: Depression, unspecified: F32.A

## 2015-08-22 HISTORY — DX: Headache: R51

## 2015-08-22 LAB — BASIC METABOLIC PANEL
Anion gap: 5 (ref 5–15)
BUN: 11 mg/dL (ref 6–20)
CHLORIDE: 112 mmol/L — AB (ref 101–111)
CO2: 21 mmol/L — AB (ref 22–32)
CREATININE: 1.12 mg/dL — AB (ref 0.44–1.00)
Calcium: 8.7 mg/dL — ABNORMAL LOW (ref 8.9–10.3)
GFR calc non Af Amer: 48 mL/min — ABNORMAL LOW (ref >60)
GFR, EST AFRICAN AMERICAN: 55 mL/min — AB (ref >60)
Glucose, Bld: 91 mg/dL (ref 65–99)
POTASSIUM: 4.6 mmol/L (ref 3.5–5.1)
SODIUM: 138 mmol/L (ref 135–145)

## 2015-08-22 LAB — CBC
HEMATOCRIT: 36.9 % (ref 36.0–46.0)
Hemoglobin: 11.2 g/dL — ABNORMAL LOW (ref 12.0–15.0)
MCH: 26.6 pg (ref 26.0–34.0)
MCHC: 30.4 g/dL (ref 30.0–36.0)
MCV: 87.6 fL (ref 78.0–100.0)
Platelets: 271 10*3/uL (ref 150–400)
RBC: 4.21 MIL/uL (ref 3.87–5.11)
RDW: 18.6 % — ABNORMAL HIGH (ref 11.5–15.5)
WBC: 3.9 10*3/uL — AB (ref 4.0–10.5)

## 2015-08-22 NOTE — Progress Notes (Signed)
PCP is Dr Juanita Craver Cardiologist is Dr Einar Gip States after she had stress test done Dr Einar Gip didn't need to see her any more, everything was fine. Stress test noted from 2010 Echo noted form 2009(both in epic)

## 2015-08-24 MED ORDER — CEFAZOLIN SODIUM-DEXTROSE 2-4 GM/100ML-% IV SOLN
2.0000 g | INTRAVENOUS | Status: AC
  Administered 2015-08-27: 2 g via INTRAVENOUS
  Filled 2015-08-24: qty 100

## 2015-08-27 ENCOUNTER — Ambulatory Visit (HOSPITAL_COMMUNITY): Payer: Medicare PPO | Admitting: Certified Registered Nurse Anesthetist

## 2015-08-27 ENCOUNTER — Ambulatory Visit (HOSPITAL_COMMUNITY)
Admission: RE | Admit: 2015-08-27 | Discharge: 2015-08-27 | Disposition: A | Discharge status: Home / Self Care | Payer: Medicare PPO | Source: Ambulatory Visit | Attending: General Surgery | Admitting: General Surgery

## 2015-08-27 ENCOUNTER — Ambulatory Visit: Payer: Medicare PPO

## 2015-08-27 ENCOUNTER — Ambulatory Visit (HOSPITAL_COMMUNITY): Payer: Medicare PPO

## 2015-08-27 ENCOUNTER — Other Ambulatory Visit: Payer: Medicare PPO

## 2015-08-27 ENCOUNTER — Encounter (HOSPITAL_COMMUNITY)
Admission: RE | Disposition: A | Discharge status: Home / Self Care | Payer: Self-pay | Source: Ambulatory Visit | Attending: General Surgery

## 2015-08-27 ENCOUNTER — Ambulatory Visit: Payer: Medicare PPO | Admitting: Hematology & Oncology

## 2015-08-27 DIAGNOSIS — Z87891 Personal history of nicotine dependence: Secondary | ICD-10-CM | POA: Insufficient documentation

## 2015-08-27 DIAGNOSIS — K811 Chronic cholecystitis: Secondary | ICD-10-CM | POA: Diagnosis present

## 2015-08-27 DIAGNOSIS — K828 Other specified diseases of gallbladder: Secondary | ICD-10-CM

## 2015-08-27 DIAGNOSIS — I10 Essential (primary) hypertension: Secondary | ICD-10-CM | POA: Insufficient documentation

## 2015-08-27 DIAGNOSIS — K219 Gastro-esophageal reflux disease without esophagitis: Secondary | ICD-10-CM | POA: Insufficient documentation

## 2015-08-27 DIAGNOSIS — J45909 Unspecified asthma, uncomplicated: Secondary | ICD-10-CM | POA: Diagnosis not present

## 2015-08-27 DIAGNOSIS — K824 Cholesterolosis of gallbladder: Secondary | ICD-10-CM | POA: Insufficient documentation

## 2015-08-27 HISTORY — PX: CHOLECYSTECTOMY: SHX55

## 2015-08-27 SURGERY — LAPAROSCOPIC CHOLECYSTECTOMY WITH INTRAOPERATIVE CHOLANGIOGRAM
Anesthesia: General | Site: Abdomen

## 2015-08-27 MED ORDER — CHLORHEXIDINE GLUCONATE CLOTH 2 % EX PADS: 6.0000 | MEDICATED_PAD | Freq: Once | CUTANEOUS | Status: DC

## 2015-08-27 MED ORDER — FENTANYL CITRATE (PF) 100 MCG/2ML IJ SOLN
INTRAMUSCULAR | Status: DC | PRN
  Administered 2015-08-27 (×4): 50 ug via INTRAVENOUS

## 2015-08-27 MED ORDER — PROPOFOL 10 MG/ML IV BOLUS
INTRAVENOUS | Status: AC
  Filled 2015-08-27: qty 20

## 2015-08-27 MED ORDER — BUPIVACAINE-EPINEPHRINE (PF) 0.25% -1:200000 IJ SOLN
INTRAMUSCULAR | Status: AC
  Filled 2015-08-27: qty 30

## 2015-08-27 MED ORDER — GLYCOPYRROLATE 0.2 MG/ML IV SOSY
PREFILLED_SYRINGE | INTRAVENOUS | Status: DC | PRN
  Administered 2015-08-27: 0.4 mg via INTRAVENOUS

## 2015-08-27 MED ORDER — ONDANSETRON HCL 4 MG/2ML IJ SOLN
INTRAMUSCULAR | Status: DC | PRN
  Administered 2015-08-27: 4 mg via INTRAVENOUS

## 2015-08-27 MED ORDER — IOPAMIDOL (ISOVUE-300) INJECTION 61%
INTRAVENOUS | Status: AC
  Filled 2015-08-27: qty 50

## 2015-08-27 MED ORDER — OXYCODONE HCL 5 MG PO TABS: 5.0000 mg | ORAL_TABLET | ORAL | Status: DC | PRN

## 2015-08-27 MED ORDER — LACTATED RINGERS IV SOLN: INTRAVENOUS | Status: DC

## 2015-08-27 MED ORDER — ONDANSETRON HCL 4 MG/2ML IJ SOLN
INTRAMUSCULAR | Status: AC
  Filled 2015-08-27: qty 4

## 2015-08-27 MED ORDER — MEPERIDINE HCL 25 MG/ML IJ SOLN: 6.2500 mg | INTRAMUSCULAR | Status: DC | PRN

## 2015-08-27 MED ORDER — PROMETHAZINE HCL 25 MG/ML IJ SOLN: 6.2500 mg | INTRAMUSCULAR | Status: DC | PRN

## 2015-08-27 MED ORDER — NEOSTIGMINE METHYLSULFATE 5 MG/5ML IV SOSY
PREFILLED_SYRINGE | INTRAVENOUS | Status: AC
  Filled 2015-08-27: qty 5

## 2015-08-27 MED ORDER — PROPOFOL 10 MG/ML IV BOLUS
INTRAVENOUS | Status: DC | PRN
  Administered 2015-08-27: 130 mg via INTRAVENOUS

## 2015-08-27 MED ORDER — 0.9 % SODIUM CHLORIDE (POUR BTL) OPTIME
TOPICAL | Status: DC | PRN
  Administered 2015-08-27: 1000 mL

## 2015-08-27 MED ORDER — HYDROMORPHONE HCL 1 MG/ML IJ SOLN: 0.2500 mg | INTRAMUSCULAR | Status: DC | PRN

## 2015-08-27 MED ORDER — SODIUM CHLORIDE 0.9 % IV SOLN
INTRAVENOUS | Status: DC | PRN
  Administered 2015-08-27: 6 mL

## 2015-08-27 MED ORDER — ROCURONIUM BROMIDE 50 MG/5ML IV SOLN
INTRAVENOUS | Status: AC
  Filled 2015-08-27: qty 2

## 2015-08-27 MED ORDER — LIDOCAINE 2% (20 MG/ML) 5 ML SYRINGE
INTRAMUSCULAR | Status: DC | PRN
  Administered 2015-08-27: 80 mg via INTRAVENOUS

## 2015-08-27 MED ORDER — LIDOCAINE 2% (20 MG/ML) 5 ML SYRINGE
INTRAMUSCULAR | Status: AC
  Filled 2015-08-27: qty 10

## 2015-08-27 MED ORDER — ROCURONIUM BROMIDE 10 MG/ML (PF) SYRINGE
PREFILLED_SYRINGE | INTRAVENOUS | Status: DC | PRN
  Administered 2015-08-27: 40 mg via INTRAVENOUS

## 2015-08-27 MED ORDER — ACETAMINOPHEN 10 MG/ML IV SOLN
INTRAVENOUS | Status: DC | PRN
  Administered 2015-08-27: 1000 mg via INTRAVENOUS

## 2015-08-27 MED ORDER — DEXTROSE IN LACTATED RINGERS 5 % IV SOLN: INTRAVENOUS | Status: DC

## 2015-08-27 MED ORDER — BUPIVACAINE-EPINEPHRINE 0.25% -1:200000 IJ SOLN
INTRAMUSCULAR | Status: DC | PRN
  Administered 2015-08-27: 8 mL

## 2015-08-27 MED ORDER — FENTANYL CITRATE (PF) 250 MCG/5ML IJ SOLN
INTRAMUSCULAR | Status: AC
Start: 2015-08-27 — End: 2015-08-27
  Filled 2015-08-27: qty 5

## 2015-08-27 MED ORDER — DEXAMETHASONE SODIUM PHOSPHATE 10 MG/ML IJ SOLN
INTRAMUSCULAR | Status: DC | PRN
  Administered 2015-08-27: 10 mg via INTRAVENOUS

## 2015-08-27 MED ORDER — GLYCOPYRROLATE 0.2 MG/ML IV SOSY
PREFILLED_SYRINGE | INTRAVENOUS | Status: AC
  Filled 2015-08-27: qty 3

## 2015-08-27 MED ORDER — KETOROLAC TROMETHAMINE 30 MG/ML IJ SOLN
INTRAMUSCULAR | Status: DC | PRN
  Administered 2015-08-27: 15 mg via INTRAVENOUS

## 2015-08-27 MED ORDER — SODIUM CHLORIDE 0.9 % IR SOLN
Status: DC | PRN
  Administered 2015-08-27: 1000 mL

## 2015-08-27 MED ORDER — NEOSTIGMINE METHYLSULFATE 5 MG/5ML IV SOSY
PREFILLED_SYRINGE | INTRAVENOUS | Status: DC | PRN
  Administered 2015-08-27: 3 mg via INTRAVENOUS

## 2015-08-27 MED ORDER — LACTATED RINGERS IV SOLN
INTRAVENOUS | Status: DC
  Administered 2015-08-27: 09:00:00 via INTRAVENOUS

## 2015-08-27 MED ORDER — ACETAMINOPHEN 10 MG/ML IV SOLN
INTRAVENOUS | Status: AC
  Filled 2015-08-27: qty 100

## 2015-08-27 SURGICAL SUPPLY — 47 items
APL SKNCLS STERI-STRIP NONHPOA (GAUZE/BANDAGES/DRESSINGS) ×1
APPLIER CLIP 5 13 M/L LIGAMAX5 (MISCELLANEOUS) ×3
APR CLP MED LRG 5 ANG JAW (MISCELLANEOUS) ×1
BAG SPEC RTRVL 10 TROC 200 (ENDOMECHANICALS) ×1
BAG SPEC RTRVL LRG 6X4 10 (ENDOMECHANICALS) ×1
BENZOIN TINCTURE PRP APPL 2/3 (GAUZE/BANDAGES/DRESSINGS) ×3 IMPLANT
CANISTER SUCTION 2500CC (MISCELLANEOUS) ×3 IMPLANT
CATH REDDICK CHOLANGI 4FR 50CM (CATHETERS) ×3 IMPLANT
CHLORAPREP W/TINT 26ML (MISCELLANEOUS) ×3 IMPLANT
CLIP APPLIE 5 13 M/L LIGAMAX5 (MISCELLANEOUS) ×1 IMPLANT
CLOSURE WOUND 1/2 X4 (GAUZE/BANDAGES/DRESSINGS) ×1
COVER MAYO STAND STRL (DRAPES) ×3 IMPLANT
COVER SURGICAL LIGHT HANDLE (MISCELLANEOUS) ×3 IMPLANT
DECANTER SPIKE VIAL GLASS SM (MISCELLANEOUS) ×3 IMPLANT
DRAPE C-ARM 42X72 X-RAY (DRAPES) ×3 IMPLANT
DRSG TEGADERM 2-3/8X2-3/4 SM (GAUZE/BANDAGES/DRESSINGS) ×12 IMPLANT
ELECT REM PT RETURN 9FT ADLT (ELECTROSURGICAL) ×3
ELECTRODE REM PT RTRN 9FT ADLT (ELECTROSURGICAL) ×1 IMPLANT
GAUZE SPONGE 2X2 8PLY STRL LF (GAUZE/BANDAGES/DRESSINGS) ×1 IMPLANT
GLOVE BIOGEL PI IND STRL 8 (GLOVE) ×1 IMPLANT
GLOVE BIOGEL PI INDICATOR 8 (GLOVE) ×2
GLOVE ECLIPSE 8.0 STRL XLNG CF (GLOVE) ×3 IMPLANT
GOWN STRL REUS W/ TWL LRG LVL3 (GOWN DISPOSABLE) ×3 IMPLANT
GOWN STRL REUS W/TWL LRG LVL3 (GOWN DISPOSABLE) ×9
IV CATH 14GX2 1/4 (CATHETERS) ×3 IMPLANT
KIT BASIN OR (CUSTOM PROCEDURE TRAY) ×3 IMPLANT
KIT ROOM TURNOVER OR (KITS) ×3 IMPLANT
NS IRRIG 1000ML POUR BTL (IV SOLUTION) ×3 IMPLANT
PAD ARMBOARD 7.5X6 YLW CONV (MISCELLANEOUS) ×3 IMPLANT
POUCH RETRIEVAL ECOSAC 10 (ENDOMECHANICALS) IMPLANT
POUCH RETRIEVAL ECOSAC 10MM (ENDOMECHANICALS) ×2
POUCH SPECIMEN RETRIEVAL 10MM (ENDOMECHANICALS) ×3 IMPLANT
SCISSORS LAP 5X35 DISP (ENDOMECHANICALS) ×3 IMPLANT
SET IRRIG TUBING LAPAROSCOPIC (IRRIGATION / IRRIGATOR) ×3 IMPLANT
SLEEVE ENDOPATH XCEL 5M (ENDOMECHANICALS) ×6 IMPLANT
SPECIMEN JAR SMALL (MISCELLANEOUS) ×3 IMPLANT
SPONGE GAUZE 2X2 STER 10/PKG (GAUZE/BANDAGES/DRESSINGS) ×2
STRIP CLOSURE SKIN 1/2X4 (GAUZE/BANDAGES/DRESSINGS) ×2 IMPLANT
SUT MNCRL AB 4-0 PS2 18 (SUTURE) ×2 IMPLANT
SUT MON AB 4-0 PC3 18 (SUTURE) ×3 IMPLANT
TOWEL OR 17X24 6PK STRL BLUE (TOWEL DISPOSABLE) ×3 IMPLANT
TOWEL OR 17X26 10 PK STRL BLUE (TOWEL DISPOSABLE) ×3 IMPLANT
TRAY LAPAROSCOPIC MC (CUSTOM PROCEDURE TRAY) ×3 IMPLANT
TROCAR XCEL BLUNT TIP 100MML (ENDOMECHANICALS) ×3 IMPLANT
TROCAR XCEL NON-BLD 11X100MML (ENDOMECHANICALS) ×2 IMPLANT
TROCAR XCEL NON-BLD 5MMX100MML (ENDOMECHANICALS) ×3 IMPLANT
TUBING INSUFFLATION (TUBING) ×3 IMPLANT

## 2015-08-27 NOTE — Anesthesia Preprocedure Evaluation (Addendum)
Anesthesia Evaluation  Patient identified by MRN, date of birth, ID band Patient awake    Reviewed: Allergy & Precautions, NPO status , Patient's Chart, lab work & pertinent test results  Airway Mallampati: II  TM Distance: >3 FB Neck ROM: Full    Dental  (+) Teeth Intact, Dental Advisory Given   Pulmonary asthma , former smoker,    breath sounds clear to auscultation       Cardiovascular hypertension, Pt. on medications  Rhythm:Regular Rate:Normal     Neuro/Psych  Headaches, PSYCHIATRIC DISORDERS Depression    GI/Hepatic Neg liver ROS, GERD  Medicated,  Endo/Other  negative endocrine ROS  Renal/GU Renal disease  negative genitourinary   Musculoskeletal  (+) Arthritis ,   Abdominal   Peds negative pediatric ROS (+)  Hematology negative hematology ROS (+)   Anesthesia Other Findings   Reproductive/Obstetrics negative OB ROS                            Lab Results  Component Value Date   WBC 3.9* 08/22/2015   HGB 11.2* 08/22/2015   HCT 36.9 08/22/2015   MCV 87.6 08/22/2015   PLT 271 08/22/2015   Lab Results  Component Value Date   CREATININE 1.12* 08/22/2015   BUN 11 08/22/2015   NA 138 08/22/2015   K 4.6 08/22/2015   CL 112* 08/22/2015   CO2 21* 08/22/2015   Lab Results  Component Value Date   INR 0.93 12/30/2013   INR 0.95 03/05/2011   INR 0.9 10/02/2008   EKG: normal sinus rhythm, RBBB.   Anesthesia Physical Anesthesia Plan  ASA: II  Anesthesia Plan: General   Post-op Pain Management:    Induction: Intravenous  Airway Management Planned: Oral ETT  Additional Equipment:   Intra-op Plan:   Post-operative Plan: Extubation in OR  Informed Consent: I have reviewed the patients History and Physical, chart, labs and discussed the procedure including the risks, benefits and alternatives for the proposed anesthesia with the patient or authorized representative who  has indicated his/her understanding and acceptance.   Dental advisory given  Plan Discussed with: CRNA  Anesthesia Plan Comments:         Anesthesia Quick Evaluation

## 2015-08-27 NOTE — Op Note (Signed)
OPERATIVE NOTE-LAPAROSCOPIC CHOLECYSTECTOMY  Preoperative diagnosis:  Biliary dyskines9a  Postoperative diagnosis:  Same  Procedure: Laparoscopic cholecystectomy with cholangiogram.  Surgeon: [name], M.D.  Asst.:  Cedric Fishman, RNFA  Anesthesia: General  Indication:   This is a 70 year old female with biliary colic type symptoms and biliary dyskinesia on Hepatobiliary scan.  She now presents for elective cholecystectomy.  She has had a previous umbilical hernia repair with mesh.  Technique: She was brought to the operating room, placed supine on the operating table, and a general anesthetic was administered.  The abdominal wall was then sterilely prepped and draped.  A timeout was performed.    She was placed in slight reverse Trendelenburg position. Local anesthetic (Marcaine) was infiltrated in the right upper quadrant. A small right upper quadrant  incision was made and, using a 5 mm Optiview trocar and laparoscope, access was gained into the peritoneal cavity. A pneumoperitoneum was created by insufflation of carbon dioxide gas. The laparoscope was introduced into the trocar and no underlying bleeding or organ injury was noted. She was then placed in the reverse Trendelenburg position with the right side tilted slightly up.  Adhesions between the omentum and anterior abdominal wall in the midepigastric and periumbilical areas were noted.  These were bluntly freed up to the right and superior to the periumbilical region and visualized mesh.  A 5 mm trocar was then placed into the abdominal cavity in this area.  An 11 mm trocar was placed just lateral to the midline in the upper epigastric area.  A 5 mm trocar was placed in the lateral right upper quadrant.  The gallbladder was visualized and the fundus was grasped and retracted toward the right shoulder.  The infundibulum was mobilized with dissection close to the gallbladder and retracted laterally. The cystic duct was identified  and a window was created around it. The cystic artery was also identified and a window was created around it. The critical view was achieved. A clip was placed at the neck of the gallbladder. A small incision was made in the cystic duct. A cholangiocatheter was introduced through the anterior abdominal wall and placed in the cystic duct. A intraoperative cholangiogram was then performed.  Under real-time fluoroscopy, dilute contrast was injected into the cystic duct.  The common hepatic duct, the right and left hepatic ducts, and the common duct were all visualized. Contrast drained into the duodenum without obvious evidence of any obstructing ductal lesion. The final report is pending the Radiologist's interpretation.  The cholangiocatheter was removed, the cystic duct was clipped 3 times on the biliary side, and then the cystic duct was divided sharply. No bile leak was noted from the cystic duct stump.  The cystic artery was then clipped and divided. Following this the gallbladder was dissected free from the liver using electrocautery. The gallbladder was then placed in a retrieval bag and removed from the abdominal cavity through the upper epigastric incision.  The gallbladder fossa was inspected, irrigated, and bleeding was controlled with electrocautery. Inspection showed that hemostasis was adequate and there was no evidence of bile leak.  The irrigation fluid was evacuated as much as possible. The abdominal cavity was inspected and there was no evidence of bleeding or organ injury.  The remaining trocars were removed and the pneumoperitoneum was released. The skin incisions were closed with 4-0 Monocryl subcuticular stitches. Steri-Strips and sterile dressings were applied.  The procedure was well-tolerated without any apparent complications. She was taken to the recovery room  in satisfactory condition.

## 2015-08-27 NOTE — Anesthesia Procedure Notes (Addendum)
Procedure Name: Intubation Date/Time: 08/27/2015 10:26 AM Performed by: Merdis Delay Pre-anesthesia Checklist: Patient identified, Emergency Drugs available, Suction available, Patient being monitored and Timeout performed Patient Re-evaluated:Patient Re-evaluated prior to inductionOxygen Delivery Method: Circle system utilized Preoxygenation: Pre-oxygenation with 100% oxygen Intubation Type: IV induction Ventilation: Mask ventilation without difficulty Laryngoscope Size: 3 and Mac Grade View: Grade I Tube type: Oral Tube size: 7.5 mm Number of attempts: 1 Airway Equipment and Method: Stylet Placement Confirmation: ETT inserted through vocal cords under direct vision,  CO2 detector,  breath sounds checked- equal and bilateral and positive ETCO2 Secured at: 23 cm Tube secured with: Tape Dental Injury: Teeth and Oropharynx as per pre-operative assessment  Comments: Performed by [place]   Performed by: Merdis Delay

## 2015-08-27 NOTE — Interval H&P Note (Signed)
History and Physical Interval Note:  08/27/2015 9:44 AM  Cathy Lewis  has presented today for surgery, with the diagnosis of Biliary dyskinesia  The various methods of treatment have been discussed with the patient and family. After consideration of risks, benefits and other options for treatment, the patient has consented to  Procedure(s): LAPAROSCOPIC CHOLECYSTECTOMY WITH INTRAOPERATIVE CHOLANGIOGRAM (N/A) as a surgical intervention .  The patient's history has been reviewed, patient examined, no change in status, stable for surgery.  I have reviewed the patient's chart and labs.  Questions were answered to the patient's satisfaction.     [name] [name]

## 2015-08-27 NOTE — Transfer of Care (Signed)
Immediate Anesthesia Transfer of Care Note  Patient: Cathy Lewis  Procedure(s) Performed: Procedure(s): LAPAROSCOPIC CHOLECYSTECTOMY WITH INTRAOPERATIVE CHOLANGIOGRAM (N/A)  Patient Location: PACU  Anesthesia Type:General  Level of Consciousness: oriented and patient cooperative, drowsy  Airway & Oxygen Therapy: Patient Spontanous Breathing  Post-op Assessment: Report given to RN and Post -op Vital signs reviewed and stable  Post vital signs: Reviewed and stable  Last Vitals:  Filed Vitals:   08/27/15 0828  BP: 151/99  Pulse: 84  Temp: 37.1 C  Resp: 18    Last Pain: There were no vitals filed for this visit.       Complications: No apparent anesthesia complications

## 2015-08-27 NOTE — Discharge Instructions (Signed)
LAPAROSCOPIC SURGERY: POST OP INSTRUCTIONS  1. DIET: Follow a liquid diet the first 48 hours after arrival home, such as soup, liquids, crackers, etc.  Be sure to include lots of fluids daily.  Avoid fast food or heavy meals as your are more likely to get nauseated.  Eat a low fat beginning two days after surgery.   2. Take your usually prescribed home medications unless otherwise directed. 3. PAIN CONTROL: a. Pain is best controlled by a usual combination of three different methods TOGETHER: i. Ice/Heat ii. Over the counter pain medication iii. Prescription pain medication b. Most patients will experience some swelling and bruising around the incisions.  Ice packs or heating pads (30-60 minutes up to 6 times a day) will help. Use ice for the first few days to help decrease swelling and bruising, then switch to heat to help relax tight/sore spots and speed recovery.  Some people prefer to use ice alone, heat alone, alternating between ice & heat.  Experiment to what works for you.  Swelling and bruising can take several weeks to resolve.   c. It is helpful to take an over-the-counter pain medication regularly for the first few weeks.  Choose one of the following that works best for you: i. Naproxen (Aleve, etc)  Two 220mg  tabs twice a day ii. Ibuprofen (Advil, etc) Three 200mg  tabs four times a day (every meal & bedtime) iii. Acetaminophen (Tylenol, etc) 500-650mg  four times a day (every meal & bedtime) d. A  prescription for pain medication (such as oxycodone, hydrocodone, etc) should be given to you upon discharge.  Take your pain medication as prescribed.  i. If you are having problems/concerns with the prescription medicine (does not control pain, nausea, vomiting, rash, itching, etc), please call us [phone] to see if we need to switch you to a different pain medicine that will work better for you and/or control your side effect better. ii. If you need a refill on your pain medication,  please contact your pharmacy.  They will contact our office to request authorization. Prescriptions will not be filled after 5 pm or on week-ends. 4. Avoid getting constipated.  Between the surgery and the pain medications, it is common to experience some constipation.  Increasing fluid intake and taking a fiber supplement (such as Metamucil, Citrucel, FiberCon, MiraLax, etc) 1-2 times a day regularly will usually help prevent this problem from occurring.  A mild laxative (prune juice, Milk of Magnesia, MiraLax, etc) should be taken according to package directions if there are no bowel movements after 48 hours.   5. Watch out for diarrhea.  If you have many loose bowel movements, simplify your diet to bland foods & liquids for a few days.  Stop any stool softeners and decrease your fiber supplement.  Switching to mild anti-diarrheal medications (Kayopectate, Pepto Bismol) can help.  If this worsens or does not improve, please call us. 6. Wash / shower every day.  You may shower over the dressings as they are waterproof.  Continue to shower over incision(s) after the dressing is off. 7. Remove your waterproof bandages 3 days after surgery.  You may leave the incision open to air.  You may replace a dressing/Band-Aid to cover the incision for comfort if you wish.  8. ACTIVITIES as tolerated:   a. You may resume regular (light) daily activities beginning the next day--such as daily self-care, walking, climbing stairs--gradually increasing light activities as tolerated.  No heavy lifting (over 10 pounds), straining, or intense activities  for 2 weeks. b. DO NOT PUSH THROUGH PAIN.  Let pain be your guide: If it hurts to do something, don't do it.  Pain is your body warning you to avoid that activity for another week until the pain goes down. c. You may drive when you are no longer taking prescription pain medication, you can comfortably wear a seatbelt, and you can safely maneuver your car and apply  brakes. d. Dennis Bast may have sexual intercourse when it is comfortable.  9. FOLLOW UP in our office a. Please call [facility] at [phone] to set up an appointment to see your surgeon in the office for a follow-up appointment approximately 2-3 weeks after your surgery. b. Make sure that you call for this appointment the day you arrive home to insure a convenient appointment time. 10. IF YOU HAVE DISABILITY OR FAMILY LEAVE FORMS, BRING THEM TO THE OFFICE FOR PROCESSING.  DO NOT GIVE THEM TO YOUR DOCTOR.  11.  Return to work/school:  Desk work/light activities in 5-7 days, full duty/activities in 2 weeks if pain-free.   WHEN TO CALL us [phone]: 1. Poor pain control 2. Reactions / problems with new medications (rash/itching, nausea, etc)  3. Fever over 101.5 F (38.5 C) 4. Inability to urinate 5. Nausea and/or vomiting 6. Worsening swelling or bruising 7. Continued bleeding from incision. 8. Increased pain, redness, or drainage from the incision   The clinic staff is available to answer your questions during regular business hours (8:30am-5pm).  Please dont hesitate to call and ask to speak to one of our nurses for clinical concerns.   If you have a medical emergency, go to the nearest emergency room or call 911.  A surgeon from [facility] is always on call at the [facility], [place], [place], [place], [place]  [id number] ? MAIN: [phone] ? TOLL FREE: [phone] ?  FAX [phone] [email or web address]

## 2015-08-27 NOTE — H&P (View-Only) (Signed)
Cathy Lewis 08/17/2015 8:46 AM Location: [facility] Patient #: [id number] DOB: Jul 24, 1945 Married / Language: English / Race: White Female  History of Present Illness Cathy Hollingshead MD; 08/17/2015 9:28 AM) Patient words: biliary dyskinesia.  The patient is a 70 year old female.   Note:She is referred by [name] because of right upper quadrant pain and biliary dyskinesia. Approximately 2-3 months ago she started developing pain in the right upper quadrant that radiated to her back and was associated with nausea. Seems to be made worse with eating. Ultrasound of the gallbladder did not demonstrate gallstones but did demonstrate a small polyp near the neck of the gallbladder. Nuclear medicine hepatobiliary scan demonstrated a abnormally low gallbladder ejection fraction consistent with biliary dyskinesia. They thought the chemotherapy pill for her kidney cancer was causing her pain but she has been AND continues to have the pain. She states she is lost weight. Liver function tests done recently are normal.  Of note is that she has had an umbilical hernia repair with mesh in the past.  Allergies Jeralyn Ruths, CMA; 08/17/2015 8:53 AM) TraMADol HCl *ANALGESICS - OPIOID* Azithromycin *CHEMICALS* Etodolac *ANALGESICS - ANTI-INFLAMMATORY* Avelox *FLUOROQUINOLONES*  Medication History Jeralyn Ruths, CMA; 08/17/2015 8:53 AM) FentaNYL (12MCG/HR Patch 72HR, Transdermal as directed) Active. Ocdiva (daily) Active. Advair Diskus (100-50MCG/DOSE Aero Pow Br Act, Inhalation) Active. AmLODIPine Besylate (5MG  Tablet, Oral) Active. Dicyclomine HCl (20MG  Tablet, Oral) Active. Hydrochlorothiazide (25MG  Tablet, Oral) Active. Meclizine HCl (12.5MG  Tablet, Oral) Active. Meloxicam (7.5MG  Tablet, Oral) Active. Montelukast Sodium (10MG  Tablet, Oral) Active. Ranitidine HCl (150MG  Tablet, Oral) Active. Calcium 600 (1500MG  Tablet, Oral) Active. Magnesium Gluconate  (250MG  Tablet, Oral) Active. Ventolin HFA (108 (90 Base)MCG/ACT Aerosol Soln, Inhalation) Active. Fluticasone Propionate (50MCG/ACT Suspension, Nasal) Active. Vitamin D3 (1000UNIT Capsule, Oral) Active. Probiotic (Oral) Active. Xgeva (120MG /1.7ML Solution, Subcutaneous) Active.     Review of Systems Cathy Hollingshead MD; 08/17/2015 9:21 AM)  Note: No fever or chills.  Right upper quadrant pain with some nausea and occasional dry heaves.  Does have weight loss.   Vitals Jearld Fenton Morris CMA; 08/17/2015 8:50 AM) 08/17/2015 8:49 AM Weight: 124.4 lb Height: 65in Body Surface Area: 1.62 m Body Mass Index: 20.7 kg/m  Temp.: 98.58F(Oral)  Pulse: 96 (Regular)  BP: 98/60 (Sitting, Left Arm, Standard)      Physical Exam Cathy Hollingshead MD; 08/17/2015 9:23 AM)  The physical exam findings are as follows: Note:General: WDWN in NAD. Pleasant and cooperative.  HEENT: [place]/AT, no facial masses  EYES: no icterus  CV: RRR  CHEST: Breath sounds equal and clear. Respirations nonlabored.   ABDOMEN: Soft, mild right upper quadrant tenderness, small periumbilical incision, no masses.  GU: Right groin scar was small area of firmness underneath the scar that does not fluctuate with a Valsalva maneuver.  MUSCULOSKELETAL: Some lower extremity muscular wasting  SKIN: No jaundice.  NEUROLOGIC: Alert and oriented, answers questions appropriately.  PSYCHIATRIC: Normal mood, affect , and behavior.    Assessment & Plan Cathy Hollingshead MD; 08/17/2015 9:26 AM)  BILIARY DYSKINESIA (K82.8) Impression: We discussed treatment for this including trying a very restricted diet or proceeding to laparoscopic cholecystectomy with cholangiogram. She would like to proceed with the latter. Discussed what the success rate was in most people with this disease and she understands that.  Plan: Laparoscopic cholecystectomy with cholangiogram. I have explained the procedure, risks,  and aftercare of cholecystectomy. Risks include but are not limited to bleeding, infection, wound problems, anesthesia, diarrhea, bile leak, injury to  common bile duct/liver/intestine. She seems to understand and agrees to proceed.  [name], MD

## 2015-08-29 ENCOUNTER — Encounter (HOSPITAL_COMMUNITY): Payer: Self-pay | Admitting: General Surgery

## 2015-08-29 NOTE — Anesthesia Postprocedure Evaluation (Signed)
Anesthesia Post Note  Patient: TAYLON MUSTAPHA  Procedure(s) Performed: Procedure(s) (LRB): LAPAROSCOPIC CHOLECYSTECTOMY WITH INTRAOPERATIVE CHOLANGIOGRAM (N/A)  Patient location during evaluation: PACU Anesthesia Type: General Level of consciousness: awake and alert Pain management: pain level controlled Vital Signs Assessment: post-procedure vital signs reviewed and stable Respiratory status: spontaneous breathing, nonlabored ventilation, respiratory function stable and patient connected to nasal cannula oxygen Cardiovascular status: blood pressure returned to baseline and stable Postop Assessment: no signs of nausea or vomiting Anesthetic complications: no    Last Vitals:  Filed Vitals:   08/27/15 1215 08/27/15 1226  BP:  124/56  Pulse: 83 61  Temp: 36.1 C   Resp: 15     Last Pain:  Filed Vitals:   08/29/15 1012  PainSc: McCamey Aritzel Krusemark

## 2015-09-04 ENCOUNTER — Ambulatory Visit (HOSPITAL_BASED_OUTPATIENT_CLINIC_OR_DEPARTMENT_OTHER): Payer: Medicare PPO | Admitting: Family

## 2015-09-04 ENCOUNTER — Encounter: Payer: Self-pay | Admitting: Family

## 2015-09-04 ENCOUNTER — Other Ambulatory Visit (HOSPITAL_BASED_OUTPATIENT_CLINIC_OR_DEPARTMENT_OTHER): Payer: Medicare PPO

## 2015-09-04 ENCOUNTER — Ambulatory Visit (HOSPITAL_BASED_OUTPATIENT_CLINIC_OR_DEPARTMENT_OTHER): Payer: Medicare PPO

## 2015-09-04 VITALS — BP 100/46 | HR 82 | Temp 98.3°F | Resp 16 | Ht 65.0 in | Wt 121.0 lb

## 2015-09-04 DIAGNOSIS — C649 Malignant neoplasm of unspecified kidney, except renal pelvis: Secondary | ICD-10-CM

## 2015-09-04 DIAGNOSIS — C7951 Secondary malignant neoplasm of bone: Secondary | ICD-10-CM

## 2015-09-04 DIAGNOSIS — R1013 Epigastric pain: Secondary | ICD-10-CM

## 2015-09-04 LAB — CBC WITH DIFFERENTIAL (CANCER CENTER ONLY)
BASO#: 0 10*3/uL (ref 0.0–0.2)
BASO%: 0.7 % (ref 0.0–2.0)
EOS%: 7.3 % — AB (ref 0.0–7.0)
Eosinophils Absolute: 0.4 10*3/uL (ref 0.0–0.5)
HCT: 35.9 % (ref 34.8–46.6)
HEMOGLOBIN: 11.5 g/dL — AB (ref 11.6–15.9)
LYMPH#: 1 10*3/uL (ref 0.9–3.3)
LYMPH%: 16.5 % (ref 14.0–48.0)
MCH: 28.3 pg (ref 26.0–34.0)
MCHC: 32 g/dL (ref 32.0–36.0)
MCV: 88 fL (ref 81–101)
MONO#: 0.8 10*3/uL (ref 0.1–0.9)
MONO%: 12.9 % (ref 0.0–13.0)
NEUT%: 62.6 % (ref 39.6–80.0)
NEUTROS ABS: 3.8 10*3/uL (ref 1.5–6.5)
Platelets: 225 10*3/uL (ref 145–400)
RBC: 4.06 10*6/uL (ref 3.70–5.32)
RDW: 17.5 % — AB (ref 11.1–15.7)
WBC: 6 10*3/uL (ref 3.9–10.0)

## 2015-09-04 LAB — CMP (CANCER CENTER ONLY)
ALK PHOS: 62 U/L (ref 26–84)
ALT(SGPT): 19 U/L (ref 10–47)
AST: 21 U/L (ref 11–38)
Albumin: 3.5 g/dL (ref 3.3–5.5)
BUN, Bld: 17 mg/dL (ref 7–22)
CALCIUM: 9.2 mg/dL (ref 8.0–10.3)
CO2: 27 meq/L (ref 18–33)
Chloride: 104 mEq/L (ref 98–108)
Creat: 1.4 mg/dl — ABNORMAL HIGH (ref 0.6–1.2)
GLUCOSE: 86 mg/dL (ref 73–118)
POTASSIUM: 4.1 meq/L (ref 3.3–4.7)
Sodium: 137 mEq/L (ref 128–145)
Total Bilirubin: 0.7 mg/dl (ref 0.20–1.60)
Total Protein: 6.6 g/dL (ref 6.4–8.1)

## 2015-09-04 LAB — LACTATE DEHYDROGENASE: LDH: 174 U/L (ref 125–245)

## 2015-09-04 MED ORDER — DENOSUMAB 120 MG/1.7ML ~~LOC~~ SOLN
120.0000 mg | Freq: Once | SUBCUTANEOUS | Status: AC
  Administered 2015-09-04: 120 mg via SUBCUTANEOUS
  Filled 2015-09-04: qty 1.7

## 2015-09-04 NOTE — Progress Notes (Signed)
Hematology and Oncology Follow Up Visit  Cathy Lewis [id number] 03-18-1945 70 y.o. 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 She had her gallbladder removed last week and is recuperating nicely. She has had some epigastric pain under the left side of her rib cage. She plans to follow-up with her surgeons office and as about a change in antacid.  She has had some constipation and is taking Mirilax BID. Her last BM was yesterday.  She still takes her pain medication sparingly because it bothers her stomach. She has a good appetite but is still eating liquids and soft foods like mashed potatoes. She is staying well hydrated. Her weight is stable.  No fever, chills, n/v, cough, rash, dizziness, shortness of breath, chest pain, palpitations or changes in bladder habits.  No lymphadenopathy found on exam. No episodes of bleeding or bruising.  No syncopal episodes or falls. No swelling, numbness or tingling in her extremities. No c/o joint pain.   Medications:    Medication List       This list is accurate as of: 09/04/15 10:56 AM.  Always use your most recent med list.               acetaminophen 500 MG tablet  Commonly known as:  TYLENOL  Take 1,000 mg by mouth daily as needed for mild pain or fever. Reported on 07/24/2015     ADVAIR DISKUS 100-50 MCG/DOSE Aepb  Generic drug:  Fluticasone-Salmeterol  Inhale 1 puff into the lungs 2 (two) times daily before a meal.     albuterol 90 MCG/ACT inhaler  Commonly known as:  PROVENTIL,VENTOLIN  Inhale 2 puffs into the lungs every 4 (four) hours as needed for wheezing.     amLODipine 5 MG tablet  Commonly known as:  NORVASC  Take 5 mg by mouth daily.     cabozantinib S-Malate 40 MG Tabs  Commonly known as:  CABOMETYX  Take  40 mg by mouth daily.     CALCIUM + D PO  Take 1 tablet by mouth daily.     dicyclomine 20 MG tablet  Commonly known as:  BENTYL  Take 20 mg by mouth 3 (three) times daily as needed (Stomach Cramps).     fentaNYL 12 MCG/HR  Commonly known as:  DURAGESIC - dosed mcg/hr  Place 2 patches (25 mcg total) onto the skin every 3 (three) days.     HYDROcodone-acetaminophen 5-325 MG tablet  Commonly known as:  NORCO/VICODIN  Take 1-2 tablets by mouth every 4 (four) hours as needed for moderate pain or severe pain.     Magnesium 250 MG Tabs  Take 250 mg by mouth daily.     meclizine 25 MG tablet  Commonly known as:  ANTIVERT  Take 12.5 mg by mouth 3 (three) times daily as needed. For dizziness.     metoCLOPramide 10 MG tablet  Commonly known as:  REGLAN  Take 1 tablet (10 mg total) by mouth every 6 (six) hours as needed for nausea.     montelukast 10 MG tablet  Commonly known as:  SINGULAIR  Take 10 mg by mouth at bedtime.     OSTEO BI-FLEX ADV DOUBLE ST PO  Take 1 tablet by mouth daily.     PROBIOTIC  DAILY PO  Take 1 tablet by mouth daily.     ranitidine 150 MG tablet  Commonly known as:  ZANTAC  Take 150 mg by mouth 2 (two) times daily.     vitamin C 500 MG tablet  Commonly known as:  ASCORBIC ACID  Take 500 mg by mouth daily.     XGEVA 120 MG/1.7ML Soln injection  Generic drug:  denosumab  Inject 120 mg into the skin every 30 (thirty) days.        Allergies:  Allergies  Allergen Reactions  . Azithromycin Other (See Comments)    Stomach cramps.  Green black stool   . Ciprofloxacin Other (See Comments)    Body aches Chills Heart races  . Etodolac Palpitations and Other (See Comments)    Joints hurt   . Gnp [Acetaminophen] Other (See Comments)    "GRN" (cough syrup) Heart races  . Moxifloxacin Palpitations and Other (See Comments)    Body ache  . Tramadol Nausea And Vomiting and Other (See Comments)    Dizziness  . Rofecoxib Other (See Comments)    Pt  unsure     Past Medical History, Surgical history, Social history, and Family History were reviewed and updated.  Review of Systems: All other 10 point review of systems is negative.   Physical Exam:  height is 5\' 5"  (1.651 m) and weight is 121 lb 0.6 oz (54.903 kg). Her oral temperature is 98.3 F (36.8 C). Her blood pressure is 100/46 and her pulse is 82. Her respiration is 16.   Wt Readings from Last 3 Encounters:  09/04/15 121 lb 0.6 oz (54.903 kg)  08/27/15 122 lb 6.4 oz (55.52 kg)  08/22/15 122 lb 6.4 oz (55.52 kg)    Ocular: Sclerae unicteric, pupils equal, round and reactive to light Ear-nose-throat: Oropharynx clear, dentition fair Lymphatic: No cervical supraclavicular or axillary adenopathy Lungs no rales or rhonchi, good excursion bilaterally Heart regular rate and rhythm, no murmur appreciated Abd soft, nontender, positive bowel sounds MSK Chronic back pain and tenderness due to metastatic disease, no joint edema Neuro: non-focal, well-oriented, appropriate affect Breasts: Deferred  Lab Results  Component Value Date   WBC 6.0 09/04/2015   HGB 11.5* 09/04/2015   HCT 35.9 09/04/2015   MCV 88 09/04/2015   PLT 225 09/04/2015   No results found for: FERRITIN, IRON, TIBC, UIBC, IRONPCTSAT Lab Results  Component Value Date   RBC 4.06 09/04/2015   No results found for: KPAFRELGTCHN, LAMBDASER, KAPLAMBRATIO No results found for: Kandis Cocking, IGMSERUM No results found for: Odetta Pink, SPEI   Chemistry      Component Value Date/Time   NA 137 09/04/2015 0858   NA 138 08/22/2015 0917   NA 141 05/29/2015 0850   K 4.1 09/04/2015 0858   K 4.6 08/22/2015 0917   K 3.5 05/29/2015 0850   CL 104 09/04/2015 0858   CL 112* 08/22/2015 0917   CO2 27 09/04/2015 0858   CO2 21* 08/22/2015 0917   CO2 29 05/29/2015 0850   BUN 17 09/04/2015 0858   BUN 11 08/22/2015 0917   BUN 26.7* 05/29/2015 0850   CREATININE 1.4*  09/04/2015 0858   CREATININE 1.12* 08/22/2015 0917   CREATININE 1.8* 05/29/2015 0850   GLU 104 07/20/2008      Component Value Date/Time   CALCIUM 9.2 09/04/2015 0858   CALCIUM 8.7* 08/22/2015 0917   CALCIUM 10.6* 05/29/2015 0850   ALKPHOS 62 09/04/2015 0858  ALKPHOS 80 05/29/2015 0850   ALKPHOS 54 04/05/2015 1030   AST 21 09/04/2015 0858   AST 24 05/29/2015 0850   AST 23 04/05/2015 1030   ALT 19 09/04/2015 0858   ALT 18 05/29/2015 0850   ALT 18 04/05/2015 1030   BILITOT 0.70 09/04/2015 0858   BILITOT 0.76 05/29/2015 0850   BILITOT 0.5 04/05/2015 1030     Impression and Plan: Cathy Lewis is a very pleasant 71 yo white female with metastatic clear cell renal carcinoma diagnosed in 2013. She is recuperating from having her gallbladder removed last week. She is still having some epigastric cramping. She will follow-up with surgery to see about management of these symptoms.  CBC and CMp are stable. Her creatinine is 1.4.  She will start Carbometyx 40 mg daily today per [name].  We will plan to see her back in 1 month for repeat lab work and follow-up.  She will contact us with any questions or concerns. We can certainly see her sooner if need be.   Eliezer Bottom, NP 7/11/201710:56 AM

## 2015-09-04 NOTE — Patient Instructions (Signed)
Denosumab injection  What is this medicine?  DENOSUMAB (den oh sue mab) slows bone breakdown. Prolia is used to treat osteoporosis in women after menopause and in men. Xgeva is used to prevent bone fractures and other bone problems caused by cancer bone metastases. Xgeva is also used to treat giant cell tumor of the bone.  This medicine may be used for other purposes; ask your health care provider or pharmacist if you have questions.  What should I tell my health care provider before I take this medicine?  They need to know if you have any of these conditions:  -dental disease  -eczema  -infection or history of infections  -kidney disease or on dialysis  -low blood calcium or vitamin D  -malabsorption syndrome  -scheduled to have surgery or tooth extraction  -taking medicine that contains denosumab  -thyroid or parathyroid disease  -an unusual reaction to denosumab, other medicines, foods, dyes, or preservatives  -pregnant or trying to get pregnant  -breast-feeding  How should I use this medicine?  This medicine is for injection under the skin. It is given by a health care professional in a hospital or clinic setting.  If you are getting Prolia, a special MedGuide will be given to you by the pharmacist with each prescription and refill. Be sure to read this information carefully each time.  For Prolia, talk to your pediatrician regarding the use of this medicine in children. Special care may be needed. For Xgeva, talk to your pediatrician regarding the use of this medicine in children. While this drug may be prescribed for children as young as 13 years for selected conditions, precautions do apply.  Overdosage: If you think you have taken too much of this medicine contact a poison control center or emergency room at once.  NOTE: This medicine is only for you. Do not share this medicine with others.  What if I miss a dose?  It is important not to miss your dose. Call your doctor or health care professional if you are  unable to keep an appointment.  What may interact with this medicine?  Do not take this medicine with any of the following medications:  -other medicines containing denosumab  This medicine may also interact with the following medications:  -medicines that suppress the immune system  -medicines that treat cancer  -steroid medicines like prednisone or cortisone  This list may not describe all possible interactions. Give your health care provider a list of all the medicines, herbs, non-prescription drugs, or dietary supplements you use. Also tell them if you smoke, drink alcohol, or use illegal drugs. Some items may interact with your medicine.  What should I watch for while using this medicine?  Visit your doctor or health care professional for regular checks on your progress. Your doctor or health care professional may order blood tests and other tests to see how you are doing.  Call your doctor or health care professional if you get a cold or other infection while receiving this medicine. Do not treat yourself. This medicine may decrease your body's ability to fight infection.  You should make sure you get enough calcium and vitamin D while you are taking this medicine, unless your doctor tells you not to. Discuss the foods you eat and the vitamins you take with your health care professional.  See your dentist regularly. Brush and floss your teeth as directed. Before you have any dental work done, tell your dentist you are receiving this medicine.  Do   not become pregnant while taking this medicine or for 5 months after stopping it. Women should inform their doctor if they wish to become pregnant or think they might be pregnant. There is a potential for serious side effects to an unborn child. Talk to your health care professional or pharmacist for more information.  What side effects may I notice from receiving this medicine?  Side effects that you should report to your doctor or health care professional as soon as  possible:  -allergic reactions like skin rash, itching or hives, swelling of the face, lips, or tongue  -breathing problems  -chest pain  -fast, irregular heartbeat  -feeling faint or lightheaded, falls  -fever, chills, or any other sign of infection  -muscle spasms, tightening, or twitches  -numbness or tingling  -skin blisters or bumps, or is dry, peels, or red  -slow healing or unexplained pain in the mouth or jaw  -unusual bleeding or bruising  Side effects that usually do not require medical attention (Report these to your doctor or health care professional if they continue or are bothersome.):  -muscle pain  -stomach upset, gas  This list may not describe all possible side effects. Call your doctor for medical advice about side effects. You may report side effects to FDA at 1-800-FDA-1088.  Where should I keep my medicine?  This medicine is only given in a clinic, doctor's office, or other health care setting and will not be stored at home.  NOTE: This sheet is a summary. It may not cover all possible information. If you have questions about this medicine, talk to your doctor, pharmacist, or health care provider.      2016, [employer] Standard. (2011-08-11 12:37:47)

## 2015-09-10 ENCOUNTER — Ambulatory Visit: Payer: Medicare PPO

## 2015-09-10 ENCOUNTER — Ambulatory Visit: Payer: Medicare PPO | Admitting: Hematology & Oncology

## 2015-09-10 ENCOUNTER — Other Ambulatory Visit: Payer: Medicare PPO

## 2015-09-12 ENCOUNTER — Telehealth: Payer: Self-pay

## 2015-09-12 ENCOUNTER — Other Ambulatory Visit: Payer: Self-pay

## 2015-09-12 MED ORDER — SULFAMETHOXAZOLE-TRIMETHOPRIM 800-160 MG PO TABS: 1.0000 | ORAL_TABLET | Freq: Two times a day (BID) | ORAL | Status: DC

## 2015-09-12 NOTE — Telephone Encounter (Signed)
Received VM om pt requesting referral for urologist. Pt states "I am just burning so bad & I can't deal with this pain."  Contacted pt for further questioning & to ask pt to come in for U/A. Pt reports hematuria & dysuria. Reports taking Azo-standard OTC without relief. Ms. Fichtner says she is currently on vacation but "wanted to get the ball rolling on a referral so I can get some relief." Pt reports to having chills at times and "really bad" back pain.   Upon learning pt is on vacation, Dr Ginette Pitman wishes for pt to begin antibiotic. Per Dr Ginette Pitman, wait on urology referral for now. Pt notified by phone. Local pharmacy provided by pt. dph

## 2015-09-25 ENCOUNTER — Ambulatory Visit (HOSPITAL_BASED_OUTPATIENT_CLINIC_OR_DEPARTMENT_OTHER): Payer: Medicare PPO | Admitting: Family

## 2015-09-25 ENCOUNTER — Other Ambulatory Visit (HOSPITAL_BASED_OUTPATIENT_CLINIC_OR_DEPARTMENT_OTHER): Payer: Medicare PPO

## 2015-09-25 ENCOUNTER — Encounter: Payer: Self-pay | Admitting: Family

## 2015-09-25 VITALS — BP 132/58 | HR 83 | Temp 97.4°F | Resp 18 | Ht 65.0 in | Wt 118.0 lb

## 2015-09-25 DIAGNOSIS — K148 Other diseases of tongue: Secondary | ICD-10-CM | POA: Diagnosis not present

## 2015-09-25 DIAGNOSIS — C7951 Secondary malignant neoplasm of bone: Secondary | ICD-10-CM | POA: Diagnosis not present

## 2015-09-25 DIAGNOSIS — C649 Malignant neoplasm of unspecified kidney, except renal pelvis: Secondary | ICD-10-CM

## 2015-09-25 DIAGNOSIS — N390 Urinary tract infection, site not specified: Secondary | ICD-10-CM | POA: Diagnosis not present

## 2015-09-25 DIAGNOSIS — B370 Candidal stomatitis: Secondary | ICD-10-CM

## 2015-09-25 LAB — URINALYSIS, MICROSCOPIC (CHCC SATELLITE)
Bilirubin (Urine): NEGATIVE
Blood: NEGATIVE
Glucose: NEGATIVE mg/dL
Ketones: NEGATIVE mg/dL
NITRITE: NEGATIVE
Protein: 30 mg/dL
Specific Gravity, Urine: 1.02 (ref 1.003–1.035)
UROBILINOGEN UR: 0.2 mg/dL (ref 0.2–1)
pH: 6.5 (ref 4.60–8.00)

## 2015-09-25 LAB — CBC WITH DIFFERENTIAL (CANCER CENTER ONLY)
BASO#: 0 10*3/uL (ref 0.0–0.2)
BASO%: 0.6 % (ref 0.0–2.0)
EOS%: 5.9 % (ref 0.0–7.0)
Eosinophils Absolute: 0.3 10*3/uL (ref 0.0–0.5)
HEMATOCRIT: 39.5 % (ref 34.8–46.6)
HGB: 12.9 g/dL (ref 11.6–15.9)
LYMPH#: 1.8 10*3/uL (ref 0.9–3.3)
LYMPH%: 35.2 % (ref 14.0–48.0)
MCH: 27.7 pg (ref 26.0–34.0)
MCHC: 32.7 g/dL (ref 32.0–36.0)
MCV: 85 fL (ref 81–101)
MONO#: 0.4 10*3/uL (ref 0.1–0.9)
MONO%: 7.1 % (ref 0.0–13.0)
NEUT#: 2.6 10*3/uL (ref 1.5–6.5)
NEUT%: 51.2 % (ref 39.6–80.0)
Platelets: 190 10*3/uL (ref 145–400)
RBC: 4.66 10*6/uL (ref 3.70–5.32)
RDW: 17.4 % — AB (ref 11.1–15.7)
WBC: 5.1 10*3/uL (ref 3.9–10.0)

## 2015-09-25 LAB — CMP (CANCER CENTER ONLY)
ALT(SGPT): 38 U/L (ref 10–47)
AST: 40 U/L — AB (ref 11–38)
Albumin: 3.6 g/dL (ref 3.3–5.5)
Alkaline Phosphatase: 93 U/L — ABNORMAL HIGH (ref 26–84)
BILIRUBIN TOTAL: 0.6 mg/dL (ref 0.20–1.60)
BUN, Bld: 15 mg/dL (ref 7–22)
CALCIUM: 9.1 mg/dL (ref 8.0–10.3)
CO2: 27 meq/L (ref 18–33)
Chloride: 104 mEq/L (ref 98–108)
Creat: 1.2 mg/dl (ref 0.6–1.2)
GLUCOSE: 88 mg/dL (ref 73–118)
Sodium: 136 mEq/L (ref 128–145)
Total Protein: 7 g/dL (ref 6.4–8.1)

## 2015-09-25 MED ORDER — NYSTATIN 100000 UNIT/ML MT SUSP: 5.0000 mL | Freq: Four times a day (QID) | OROMUCOSAL | 0 refills | Status: DC

## 2015-09-25 MED ORDER — SULFAMETHOXAZOLE-TRIMETHOPRIM 800-160 MG PO TABS: 1.0000 | ORAL_TABLET | Freq: Two times a day (BID) | ORAL | 0 refills | Status: DC

## 2015-09-25 NOTE — Progress Notes (Signed)
Hematology and Oncology Follow Up Visit  Cathy Lewis [id number] [birth year] 70 y.o. 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  She was diagnosed with a UTI 2 weeks ago while at the beach and completed a course of Bactrim DS. Her symptoms have not resolved.   She has not been able to schedule an appointment with Dr. Diona Fanti yet. We re contacted their office today and hopefully will schedule her soon.   She has some yellow patches on her tongue and inner cheeks that appears to be oral thrush.  She is unable to eat with the mouth soreness. She has been able to hydrated. Her weight is down 3 lbs since her last visit.  No fever, chills, n/v, cough, rash, SOB, chest pain or palpitations.  No lymphadenopathy found on exam. No episodes of bleeding or bruising.  No syncopal episodes or falls despite the dizziness.  No swelling, numbness or tingling in her extremities. No c/o joint pain.   Medications:    Medication List       Accurate as of 09/25/15 11:30 AM. Always use your most recent med list.          acetaminophen 500 MG tablet Commonly known as:  TYLENOL Take 1,000 mg by mouth daily as needed for mild pain or fever. Reported on 07/24/2015   ADVAIR DISKUS 100-50 MCG/DOSE Aepb Generic drug:  Fluticasone-Salmeterol Inhale 1 puff into the lungs 2 (two) times daily before a meal.   albuterol 90 MCG/ACT inhaler Commonly known as:  PROVENTIL,VENTOLIN Inhale 2 puffs into the lungs every 4 (four) hours as needed for wheezing.   amLODipine 5 MG tablet Commonly known as:  NORVASC Take 5 mg by mouth daily.   cabozantinib S-Malate 40 MG Tabs Commonly known  as:  CABOMETYX Take 40 mg by mouth daily.   CALCIUM + D PO Take 1 tablet by mouth daily.   dicyclomine 20 MG tablet Commonly known as:  BENTYL Take 20 mg by mouth 3 (three) times daily as needed (Stomach Cramps).   fentaNYL 12 MCG/HR Commonly known as:  DURAGESIC - dosed mcg/hr Place 2 patches (25 mcg total) onto the skin every 3 (three) days.   HYDROcodone-acetaminophen 5-325 MG tablet Commonly known as:  NORCO/VICODIN Take 1-2 tablets by mouth every 4 (four) hours as needed for moderate pain or severe pain.   Magnesium 250 MG Tabs Take 250 mg by mouth daily.   meclizine 25 MG tablet Commonly known as:  ANTIVERT Take 12.5 mg by mouth 3 (three) times daily as needed. For dizziness.   metoCLOPramide 10 MG tablet Commonly known as:  REGLAN Take 1 tablet (10 mg total) by mouth every 6 (six) hours as needed for nausea.   montelukast 10 MG tablet Commonly known as:  SINGULAIR Take 10 mg by mouth at bedtime.   OSTEO BI-FLEX ADV DOUBLE ST PO Take 1 tablet by mouth daily.   PROBIOTIC DAILY PO Take 1 tablet by mouth daily.   ranitidine 150 MG tablet Commonly known as:  ZANTAC Take 150 mg by mouth 2 (two) times daily.   vitamin C 500 MG tablet Commonly known as:  ASCORBIC  ACID Take 500 mg by mouth daily.   XGEVA 120 MG/1.7ML Soln injection Generic drug:  denosumab Inject 120 mg into the skin every 30 (thirty) days.       Allergies:  Allergies  Allergen Reactions  . Azithromycin Other (See Comments)    Stomach cramps.  Green black stool   . Ciprofloxacin Other (See Comments)    Body aches Chills Heart races  . Etodolac Palpitations and Other (See Comments)    Joints hurt   . Gnp [Acetaminophen] Other (See Comments)    "GRN" (cough syrup) Heart races  . Moxifloxacin Palpitations and Other (See Comments)    Body ache  . Tramadol Nausea And Vomiting and Other (See Comments)    Dizziness  . Rofecoxib Other (See Comments)    Pt unsure     Past Medical  History, Surgical history, Social history, and Family History were reviewed and updated.  Review of Systems: All other 10 point review of systems is negative.   Physical Exam:  height is 5\' 5"  (1.651 m) and weight is 118 lb (53.5 kg). Her oral temperature is 97.4 F (36.3 C). Her blood pressure is 132/58 (abnormal) and her pulse is 83. Her respiration is 18.   Wt Readings from Last 3 Encounters:  09/25/15 118 lb (53.5 kg)  09/04/15 121 lb 0.6 oz (54.9 kg)  08/27/15 122 lb 6.4 oz (55.5 kg)    Ocular: Sclerae unicteric, pupils equal, round and reactive to light Ear-nose-throat: Oropharynx clear, dentition fair Lymphatic: No cervical supraclavicular or axillary adenopathy Lungs no rales or rhonchi, good excursion bilaterally Heart regular rate and rhythm, no murmur appreciated Abd soft, nontender, positive bowel sounds MSK Chronic back pain and tenderness due to metastatic disease, no joint edema Neuro: non-focal, well-oriented, appropriate affect Breasts: Deferred  Lab Results  Component Value Date   WBC 5.1 09/25/2015   HGB 12.9 09/25/2015   HCT 39.5 09/25/2015   MCV 85 09/25/2015   PLT 190 09/25/2015   No results found for: FERRITIN, IRON, TIBC, UIBC, IRONPCTSAT Lab Results  Component Value Date   RBC 4.66 09/25/2015   No results found for: KPAFRELGTCHN, LAMBDASER, KAPLAMBRATIO No results found for: IGGSERUM, IGA, IGMSERUM No results found for: Odetta Pink, SPEI   Chemistry      Component Value Date/Time   NA 137 09/04/2015 0858   NA 141 05/29/2015 0850   K 4.1 09/04/2015 0858   K 3.5 05/29/2015 0850   CL 104 09/04/2015 0858   CO2 27 09/04/2015 0858   CO2 29 05/29/2015 0850   BUN 17 09/04/2015 0858   BUN 26.7 (H) 05/29/2015 0850   CREATININE 1.4 (H) 09/04/2015 0858   CREATININE 1.8 (H) 05/29/2015 0850   GLU 104 07/20/2008      Component Value Date/Time   CALCIUM 9.2 09/04/2015 0858   CALCIUM 10.6 (H)  05/29/2015 0850   ALKPHOS 62 09/04/2015 0858   ALKPHOS 80 05/29/2015 0850   AST 21 09/04/2015 0858   AST 24 05/29/2015 0850   ALT 19 09/04/2015 0858   ALT 18 05/29/2015 0850   BILITOT 0.70 09/04/2015 0858   BILITOT 0.76 05/29/2015 0850     Impression and Plan: Cathy Lewis is a very pleasant 70 yo white female with metastatic clear cell renal carcinoma diagnosed in 2013. She is here today with c/o burning on urination, soreness in and dry mouth and lower abdominal discomfort. She has frequent UTIs and is waiting to schedule an appointment  with urology Dr. Diona Fanti.  Her urinalysis was positive for UTI. We will see what her culture grows. For now, we will have her start Bactrim DS 800 mg PO BID for 10 days.  She will also start nystatin swish and swallow QID. Prescription sent [facility]. We will plan to see her back in 1 week at her previously scheduled appointment for follow-up.  She will contact us with any questions or concerns. We can certainly see her sooner if need be.   Eliezer Bottom, NP 8/1/201711:30 AM

## 2015-09-26 LAB — URINE CULTURE

## 2015-09-28 MED FILL — *CABOMETYX 40 MG TABLET: 40 | 30 days supply | Qty: 30 | Fill #1

## 2015-10-02 ENCOUNTER — Ambulatory Visit (HOSPITAL_BASED_OUTPATIENT_CLINIC_OR_DEPARTMENT_OTHER): Payer: Medicare PPO

## 2015-10-02 ENCOUNTER — Other Ambulatory Visit (HOSPITAL_BASED_OUTPATIENT_CLINIC_OR_DEPARTMENT_OTHER): Payer: Medicare PPO

## 2015-10-02 ENCOUNTER — Ambulatory Visit (HOSPITAL_BASED_OUTPATIENT_CLINIC_OR_DEPARTMENT_OTHER): Payer: Medicare PPO | Admitting: Family

## 2015-10-02 VITALS — BP 108/64 | HR 92 | Temp 97.8°F | Resp 20 | Wt 119.0 lb

## 2015-10-02 DIAGNOSIS — C7951 Secondary malignant neoplasm of bone: Secondary | ICD-10-CM

## 2015-10-02 DIAGNOSIS — C649 Malignant neoplasm of unspecified kidney, except renal pelvis: Secondary | ICD-10-CM

## 2015-10-02 DIAGNOSIS — E860 Dehydration: Secondary | ICD-10-CM

## 2015-10-02 DIAGNOSIS — K121 Other forms of stomatitis: Secondary | ICD-10-CM

## 2015-10-02 DIAGNOSIS — B370 Candidal stomatitis: Secondary | ICD-10-CM | POA: Diagnosis not present

## 2015-10-02 DIAGNOSIS — R682 Dry mouth, unspecified: Secondary | ICD-10-CM

## 2015-10-02 LAB — CBC WITH DIFFERENTIAL (CANCER CENTER ONLY)
BASO#: 0 10*3/uL (ref 0.0–0.2)
BASO%: 0.7 % (ref 0.0–2.0)
EOS%: 3.5 % (ref 0.0–7.0)
Eosinophils Absolute: 0.2 10*3/uL (ref 0.0–0.5)
HEMATOCRIT: 39 % (ref 34.8–46.6)
HGB: 12.8 g/dL (ref 11.6–15.9)
LYMPH#: 1.4 10*3/uL (ref 0.9–3.3)
LYMPH%: 31.8 % (ref 14.0–48.0)
MCH: 27.7 pg (ref 26.0–34.0)
MCHC: 32.8 g/dL (ref 32.0–36.0)
MCV: 84 fL (ref 81–101)
MONO#: 0.3 10*3/uL (ref 0.1–0.9)
MONO%: 6.6 % (ref 0.0–13.0)
NEUT#: 2.4 10*3/uL (ref 1.5–6.5)
NEUT%: 57.4 % (ref 39.6–80.0)
PLATELETS: 181 10*3/uL (ref 145–400)
RBC: 4.62 10*6/uL (ref 3.70–5.32)
RDW: 18 % — AB (ref 11.1–15.7)
WBC: 4.3 10*3/uL (ref 3.9–10.0)

## 2015-10-02 LAB — CMP (CANCER CENTER ONLY)
ALT: 35 U/L (ref 10–47)
AST: 39 U/L — AB (ref 11–38)
Albumin: 3.4 g/dL (ref 3.3–5.5)
Alkaline Phosphatase: 102 U/L — ABNORMAL HIGH (ref 26–84)
BILIRUBIN TOTAL: 0.4 mg/dL (ref 0.20–1.60)
BUN: 17 mg/dL (ref 7–22)
CALCIUM: 9.1 mg/dL (ref 8.0–10.3)
CO2: 25 meq/L (ref 18–33)
Chloride: 104 mEq/L (ref 98–108)
Creat: 1.4 mg/dl — ABNORMAL HIGH (ref 0.6–1.2)
GLUCOSE: 94 mg/dL (ref 73–118)
Potassium: 4.4 mEq/L (ref 3.3–4.7)
Sodium: 133 mEq/L (ref 128–145)
Total Protein: 6.9 g/dL (ref 6.4–8.1)

## 2015-10-02 LAB — LACTATE DEHYDROGENASE: LDH: 313 U/L — ABNORMAL HIGH (ref 125–245)

## 2015-10-02 MED ORDER — SODIUM CHLORIDE 0.9 % IV SOLN
Freq: Once | INTRAVENOUS | Status: AC
  Administered 2015-10-02: 12:00:00 via INTRAVENOUS

## 2015-10-02 MED ORDER — DEXAMETHASONE 0.5 MG/5ML PO SOLN: 1.0000 mg | Freq: Four times a day (QID) | ORAL | 1 refills | Status: DC

## 2015-10-02 MED ORDER — PILOCARPINE HCL 5 MG PO TABS: 5.0000 mg | ORAL_TABLET | Freq: Two times a day (BID) | ORAL | 0 refills | Status: DC

## 2015-10-02 MED ORDER — FLUCONAZOLE IN SODIUM CHLORIDE 200-0.9 MG/100ML-% IV SOLN
400.0000 mg | Freq: Once | INTRAVENOUS | Status: AC
  Administered 2015-10-02: 400 mg via INTRAVENOUS
  Filled 2015-10-02: qty 200

## 2015-10-02 NOTE — Progress Notes (Signed)
Hematology and Oncology Follow Up Visit  CHAMARI STATUM [id number] 08-28-1945 70 y.o. 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 Bontrager is here today for follow-up. She is still having pain in her mouth. The yellow patches are starting to clear up and now her tongue and gums are bright red. She has been using the nystatin mouth rinse but this has not seemed to help. She is hungry but unable to eat with the pain. She states that she has remained hydrated. She stopped taking the Cabometyx on Saturday 09/29/2015.  Her weight is down 2 lbs since last week.  No fever, chills, n/v, cough, rash, dizziness, SOB, chest pain, palpitations, abdominal pain or changes in bowel or bladder habits.  She does c/o dry mouth despite using biotene. This has started to wake her up at night.   She will complete the Bactrim DS on Thursday and has no current symptoms of UTI.  No lymphadenopathy found on exam. No episodes of bleeding or bruising.  No syncopal episodes or falls despite the dizziness.  No swelling, numbness or tingling in her extremities. She still has tenderness in the left arm a little below the shoulder.   Medications:    Medication List       Accurate as of 10/02/15 11:10 AM. Always use your most recent med list.          acetaminophen 500 MG tablet Commonly known as:  TYLENOL Take 1,000 mg by mouth daily as needed for mild pain or fever. Reported on 07/24/2015   ADVAIR DISKUS 100-50 MCG/DOSE Aepb Generic drug:  Fluticasone-Salmeterol Inhale 1 puff into the lungs 2 (two) times daily before a meal.   albuterol 90 MCG/ACT inhaler Commonly known as:  PROVENTIL,VENTOLIN Inhale 2 puffs into the lungs every 4 (four) hours as needed for wheezing.   amLODipine 5 MG tablet Commonly known as:  NORVASC Take 5 mg by mouth daily.   cabozantinib S-Malate 40 MG Tabs Commonly known as:  CABOMETYX Take 40 mg by mouth daily.   CALCIUM + D PO Take 1 tablet by mouth daily.   dicyclomine 20 MG tablet Commonly known as:  BENTYL Take 20 mg by mouth 3 (three) times daily as needed (Stomach Cramps).   fentaNYL 12 MCG/HR Commonly known as:  DURAGESIC - dosed mcg/hr Place 2 patches (25 mcg total) onto the skin every 3 (three) days.   HYDROcodone-acetaminophen 5-325 MG tablet Commonly known as:  NORCO/VICODIN Take 1-2 tablets by mouth every 4 (four) hours as needed for moderate pain or severe pain.   Magnesium 250 MG Tabs Take 250 mg by mouth daily.   meclizine 25 MG tablet Commonly known as:  ANTIVERT Take 12.5 mg by mouth 3 (three) times daily as needed. For dizziness.   metoCLOPramide 10 MG tablet Commonly known as:  REGLAN Take 1 tablet (10 mg total) by mouth every 6 (six) hours as needed for nausea.   montelukast 10 MG tablet Commonly known as:  SINGULAIR Take 10 mg by mouth at bedtime.   nystatin 100000 UNIT/ML suspension Commonly known as:  MYCOSTATIN Take 5 mLs (500,000 Units total) by mouth 4 (four) times daily.   OSTEO BI-FLEX ADV DOUBLE ST PO Take 1 tablet by  mouth daily.   PROBIOTIC DAILY PO Take 1 tablet by mouth daily.   ranitidine 150 MG tablet Commonly known as:  ZANTAC Take 150 mg by mouth 2 (two) times daily.   sulfamethoxazole-trimethoprim 800-160 MG tablet Commonly known as:  BACTRIM DS,SEPTRA DS Take 1 tablet by mouth 2 (two) times daily.   vitamin C 500 MG tablet Commonly known as:  ASCORBIC ACID Take 500 mg by mouth daily.   XGEVA 120 MG/1.7ML Soln injection Generic drug:  denosumab Inject 120 mg into the skin every 30 (thirty) days.       Allergies:  Allergies  Allergen Reactions  . Azithromycin Other (See Comments)    Stomach cramps.  Green black stool   . Ciprofloxacin Other (See  Comments)    Body aches Chills Heart races  . Etodolac Palpitations and Other (See Comments)    Joints hurt   . Gnp [Acetaminophen] Other (See Comments)    "GRN" (cough syrup) Heart races  . Moxifloxacin Palpitations and Other (See Comments)    Body ache  . Tramadol Nausea And Vomiting and Other (See Comments)    Dizziness  . Rofecoxib Other (See Comments)    Pt unsure     Past Medical History, Surgical history, Social history, and Family History were reviewed and updated.  Review of Systems: All other 10 point review of systems is negative.   Physical Exam:  weight is 119 lb (54 kg). Her oral temperature is 97.8 F (36.6 C). Her blood pressure is 108/64 and her pulse is 92. Her respiration is 20.   Wt Readings from Last 3 Encounters:  10/02/15 119 lb (54 kg)  09/25/15 118 lb (53.5 kg)  09/04/15 121 lb 0.6 oz (54.9 kg)    Ocular: Sclerae unicteric, pupils equal, round and reactive to light Ear-nose-throat: Oropharynx clear, dentition fair Lymphatic: No cervical supraclavicular or axillary adenopathy Lungs no rales or rhonchi, good excursion bilaterally Heart regular rate and rhythm, no murmur appreciated Abd soft, nontender, positive bowel sounds MSK Chronic back pain and tenderness due to metastatic disease, no joint edema Neuro: non-focal, well-oriented, appropriate affect Breasts: Deferred  Lab Results  Component Value Date   WBC 4.3 10/02/2015   HGB 12.8 10/02/2015   HCT 39.0 10/02/2015   MCV 84 10/02/2015   PLT 181 10/02/2015   No results found for: FERRITIN, IRON, TIBC, UIBC, IRONPCTSAT Lab Results  Component Value Date   RBC 4.62 10/02/2015   No results found for: KPAFRELGTCHN, LAMBDASER, KAPLAMBRATIO No results found for: IGGSERUM, IGA, IGMSERUM No results found for: Kathrynn Ducking, MSPIKE, SPEI   Chemistry      Component Value Date/Time   NA 136 09/25/2015 1056   NA 141 05/29/2015 0850   K 5.0 No  visible hemolysis (H) 09/25/2015 1056   K 3.5 05/29/2015 0850   CL 104 09/25/2015 1056   CO2 27 09/25/2015 1056   CO2 29 05/29/2015 0850   BUN 15 09/25/2015 1056   BUN 26.7 (H) 05/29/2015 0850   CREATININE 1.2 09/25/2015 1056   CREATININE 1.8 (H) 05/29/2015 0850   GLU 104 07/20/2008      Component Value Date/Time   CALCIUM 9.1 09/25/2015 1056   CALCIUM 10.6 (H) 05/29/2015 0850   ALKPHOS 93 (H) 09/25/2015 1056   ALKPHOS 80 05/29/2015 0850   AST 40 (H) 09/25/2015 1056   AST 24 05/29/2015 0850   ALT 38 09/25/2015 1056   ALT 18 05/29/2015 0850   BILITOT 0.60 09/25/2015  1056   BILITOT 0.76 05/29/2015 0850     Impression and Plan: Ms. Kunzler is a very pleasant 70 yo white female with metastatic clear cell renal carcinoma diagnosed in 2013. She was on Cabometyx and has developed oral thrush/stomatitis. She has stopped the Cabometyx and we will let her have a break until the stomatitis resolves. She has not been able to eat despite being hungry due to the pain and has lost 5 lbs over the last week or so.  We will start her on a Decadron mouth rinse for the stomatitis. We will also have her start Salagen BID for dry mouth.  We will give her IV Diflucan tonight and fluids while she is here today.   We will plan to see her back in 2 weeks for repeat lab work and follow-up.  She will contact us with any questions or concerns. We can certainly see her sooner if need be.   Eliezer Bottom, NP 8/8/201711:10 AM

## 2015-10-02 NOTE — Patient Instructions (Signed)
Dehydration, Adult Dehydration is a condition in which you do not have enough fluid or water in your body. It happens when you take in less fluid than you lose. Vital organs such as the kidneys, brain, and heart cannot function without a proper amount of fluids. Any loss of fluids from the body can cause dehydration.  Dehydration can range from mild to severe. This condition should be treated right away to help prevent it from becoming severe. CAUSES  This condition may be caused by:  Vomiting.  Diarrhea.  Excessive sweating, such as when exercising in hot or humid weather.  Not drinking enough fluid during strenuous exercise or during an illness.  Excessive urine output.  Fever.  Certain medicines. RISK FACTORS This condition is more likely to develop in:  People who are taking certain medicines that cause the body to lose excess fluid (diuretics).   People who have a chronic illness, such as diabetes, that may increase urination.  Older adults.   People who live at high altitudes.   People who participate in endurance sports.  SYMPTOMS  Mild Dehydration  Thirst.  Dry lips.  Slightly dry mouth.  Dry, warm skin. Moderate Dehydration  Very dry mouth.   Muscle cramps.   Dark urine and decreased urine production.   Decreased tear production.   Headache.   Light-headedness, especially when you stand up from a sitting position.  Severe Dehydration  Changes in skin.   Cold and clammy skin.   Skin does not spring back quickly when lightly pinched and released.   Changes in body fluids.   Extreme thirst.   No tears.   Not able to sweat when body temperature is high, such as in hot weather.   Minimal urine production.   Changes in vital signs.   Rapid, weak pulse (more than 100 beats per minute when you are sitting still).   Rapid breathing.   Low blood pressure.   Other changes.   Sunken eyes.   Cold hands and feet.    Confusion.  Lethargy and difficulty being awakened.  Fainting (syncope).   Short-term weight loss.   Unconsciousness. DIAGNOSIS  This condition may be diagnosed based on your symptoms. You may also have tests to determine how severe your dehydration is. These tests may include:   Urine tests.   Blood tests.  TREATMENT  Treatment for this condition depends on the severity. Mild or moderate dehydration can often be treated at home. Treatment should be started right away. Do not wait until dehydration becomes severe. Severe dehydration needs to be treated at the hospital. Treatment for Mild Dehydration  Drinking plenty of water to replace the fluid you have lost.   Replacing minerals in your blood (electrolytes) that you may have lost.  Treatment for Moderate Dehydration  Consuming oral rehydration solution (ORS). Treatment for Severe Dehydration  Receiving fluid through an IV tube.   Receiving electrolyte solution through a feeding tube that is passed through your nose and into your stomach (nasogastric tube or NG tube).  Correcting any abnormalities in electrolytes. HOME CARE INSTRUCTIONS   Drink enough fluid to keep your urine clear or pale yellow.   Drink water or fluid slowly by taking small sips. You can also try sucking on ice cubes.  Have food or beverages that contain electrolytes. Examples include bananas and sports drinks.  Take over-the-counter and prescription medicines only as told by your health care provider.   Prepare ORS according to the manufacturer's instructions. Take sips   of ORS every 5 minutes until your urine returns to normal.  If you have vomiting or diarrhea, continue to try to drink water, ORS, or both.   If you have diarrhea, avoid:   Beverages that contain caffeine.   Fruit juice.   Milk.   Carbonated soft drinks.  Do not take salt tablets. This can lead to the condition of having too much sodium in your body  (hypernatremia).  SEEK MEDICAL CARE IF:  You cannot eat or drink without vomiting.  You have had moderate diarrhea during a period of more than 24 hours.  You have a fever. SEEK IMMEDIATE MEDICAL CARE IF:   You have extreme thirst.  You have severe diarrhea.  You have not urinated in 6-8 hours, or you have urinated only a small amount of very dark urine.  You have shriveled skin.  You are dizzy, confused, or both.   This information is not intended to replace advice given to you by your health care provider. Make sure you discuss any questions you have with your health care provider.   Document Released: 02/10/2005 Document Revised: 11/01/2014 Document Reviewed: 06/28/2014 Elsevier Interactive Patient Education 2016 [employer].   Fluconazole injection What is this medicine? FLUCONAZOLE (floo KON na zole) is an antifungal medicine. It is used to treat or prevent certain kinds of fungal or yeast infections. This medicine may be used for other purposes; ask your health care provider or pharmacist if you have questions. What should I tell my health care provider before I take this medicine? They need to know if you have any of these conditions: -history of irregular heart beat -kidney disease -an unusual or allergic reaction to fluconazole, other antifungal medicines, foods, dyes or preservatives -pregnant or trying to get pregnant -breast-feeding How should I use this medicine? This medicine is for injection into a vein. It is usually given by a health care professional in a hospital or clinic setting. If you get this medicine at home, you will be taught how to prepare and give this medicine. Use exactly as directed. Take your medicine at regular intervals. Do not take your medicine more often than directed. It is important that you put your used needles and syringes in a special sharps container. Do not put them in a trash can. If you do not have a sharps container, call  your pharmacist or healthcare provider to get one. Talk to your pediatrician regarding the use of this medicine in children. Special care may be needed. Overdosage: If you think you have taken too much of this medicine contact a poison control center or emergency room at once. NOTE: This medicine is only for you. Do not share this medicine with others. What if I miss a dose? This does not apply. What may interact with this medicine? Do not take this medicine with any of the following medications: -cisapride -pimozide -red yeast rice This medicine may also interact with the following medications: -birth control pills -cyclosporine -diuretics like hydrochlorothiazide -medicines for diabetes that are taken by mouth -medicines for high cholesterol like atorvastatin, lovastatin or simvastatin -phenytoin -ramelteon -rifabutin -rifampin -some medicines for anxiety or sleep -tacrolimus -terfenadine -theophylline -tofacitinib -warfarin This list may not describe all possible interactions. Give your health care provider a list of all the medicines, herbs, non-prescription drugs, or dietary supplements you use. Also tell them if you smoke, drink alcohol, or use illegal drugs. Some items may interact with your medicine. What should I watch for while using this  medicine? Tell your doctor if your symptoms do not improve. If you are taking this medicine for a long time you may need blood work. Some fungal infections need many weeks or months of treatment to cure completely. Alcohol can increase possible damage to your liver from this medicine. Avoid alcoholic drinks. What side effects may I notice from receiving this medicine? Side effects that you should report to your doctor or health care professional as soon as possible: -allergic reactions like skin rash or itching, hives, swelling of the lips, mouth, tongue, or throat -dark urine -feeling dizzy or faint -irregular heartbeat or chest  pain -pain, redness at site of injection -redness, blistering, peeling or loosening of the skin, including inside the mouth -stomach pain -trouble breathing -unusual bruising or bleeding -vomiting -yellowing of the eyes or skin Side effects that usually do not require medical attention (report to your doctor or health care professional if they continue or are bothersome): -changes in how food tastes -diarrhea -headache -stomach upset, nausea This list may not describe all possible side effects. Call your doctor for medical advice about side effects. You may report side effects to FDA at 1-800-FDA-1088. Where should I keep my medicine? Keep out of the reach of children. If you are using this medicine at home, you will be instructed on how to store this medicine. Throw away any unused medicine after the expiration date on the label. NOTE: This sheet is a summary. It may not cover all possible information. If you have questions about this medicine, talk to your doctor, pharmacist, or health care provider.    2016, Elsevier/Gold Standard. (2012-09-18 15:51:41)

## 2015-10-09 DIAGNOSIS — K219 Gastro-esophageal reflux disease without esophagitis: Secondary | ICD-10-CM | POA: Insufficient documentation

## 2015-10-16 ENCOUNTER — Ambulatory Visit (HOSPITAL_BASED_OUTPATIENT_CLINIC_OR_DEPARTMENT_OTHER): Payer: Medicare PPO | Admitting: Family

## 2015-10-16 ENCOUNTER — Other Ambulatory Visit (HOSPITAL_BASED_OUTPATIENT_CLINIC_OR_DEPARTMENT_OTHER): Payer: Medicare PPO

## 2015-10-16 VITALS — BP 117/60 | HR 86 | Temp 97.9°F | Wt 120.0 lb

## 2015-10-16 DIAGNOSIS — R109 Unspecified abdominal pain: Secondary | ICD-10-CM | POA: Diagnosis not present

## 2015-10-16 DIAGNOSIS — M25512 Pain in left shoulder: Secondary | ICD-10-CM

## 2015-10-16 DIAGNOSIS — R682 Dry mouth, unspecified: Secondary | ICD-10-CM

## 2015-10-16 DIAGNOSIS — C649 Malignant neoplasm of unspecified kidney, except renal pelvis: Secondary | ICD-10-CM

## 2015-10-16 DIAGNOSIS — C7951 Secondary malignant neoplasm of bone: Secondary | ICD-10-CM | POA: Diagnosis not present

## 2015-10-16 DIAGNOSIS — B370 Candidal stomatitis: Secondary | ICD-10-CM

## 2015-10-16 DIAGNOSIS — K121 Other forms of stomatitis: Secondary | ICD-10-CM

## 2015-10-16 DIAGNOSIS — M25551 Pain in right hip: Secondary | ICD-10-CM | POA: Diagnosis not present

## 2015-10-16 DIAGNOSIS — E860 Dehydration: Secondary | ICD-10-CM

## 2015-10-16 LAB — CBC WITH DIFFERENTIAL (CANCER CENTER ONLY)
BASO#: 0 10*3/uL (ref 0.0–0.2)
BASO%: 0.4 % (ref 0.0–2.0)
EOS ABS: 0.1 10*3/uL (ref 0.0–0.5)
EOS%: 3 % (ref 0.0–7.0)
HEMATOCRIT: 34.1 % — AB (ref 34.8–46.6)
HEMOGLOBIN: 11.1 g/dL — AB (ref 11.6–15.9)
LYMPH#: 1 10*3/uL (ref 0.9–3.3)
LYMPH%: 21.6 % (ref 14.0–48.0)
MCH: 28.7 pg (ref 26.0–34.0)
MCHC: 32.6 g/dL (ref 32.0–36.0)
MCV: 88 fL (ref 81–101)
MONO#: 0.6 10*3/uL (ref 0.1–0.9)
MONO%: 12 % (ref 0.0–13.0)
NEUT#: 3 10*3/uL (ref 1.5–6.5)
NEUT%: 63 % (ref 39.6–80.0)
Platelets: 184 10*3/uL (ref 145–400)
RBC: 3.87 10*6/uL (ref 3.70–5.32)
RDW: 19.6 % — AB (ref 11.1–15.7)
WBC: 4.7 10*3/uL (ref 3.9–10.0)

## 2015-10-16 LAB — CMP (CANCER CENTER ONLY)
ALBUMIN: 3.2 g/dL — AB (ref 3.3–5.5)
ALT(SGPT): 27 U/L (ref 10–47)
AST: 24 U/L (ref 11–38)
Alkaline Phosphatase: 79 U/L (ref 26–84)
BUN, Bld: 22 mg/dL (ref 7–22)
CALCIUM: 8.7 mg/dL (ref 8.0–10.3)
CO2: 26 meq/L (ref 18–33)
Chloride: 105 mEq/L (ref 98–108)
Creat: 1.2 mg/dl (ref 0.6–1.2)
GLUCOSE: 107 mg/dL (ref 73–118)
POTASSIUM: 4.1 meq/L (ref 3.3–4.7)
Sodium: 136 mEq/L (ref 128–145)
Total Bilirubin: 0.7 mg/dl (ref 0.20–1.60)
Total Protein: 6.8 g/dL (ref 6.4–8.1)

## 2015-10-16 LAB — LACTATE DEHYDROGENASE: LDH: 207 U/L (ref 125–245)

## 2015-10-16 NOTE — Progress Notes (Signed)
Hematology and Oncology Follow Up Visit  KYLINN DILULLO [id number] Oct 03, 1945 70 y.o. 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 Certo is here today for follow-up. She is having left shoulder pain again in the same area where she has received palliative radiation in the past. She has a Fentanyl duragesic patch that helps some with the discomfort. No swelling, numbness or tingling in her extremities.  She stopped taking her Cabometyx earlier this month and has not felt like restarting. Her stomatitis has resolved with the decadron mouth rinse and Salagen.  No lymphadenopathy found on exam. No episodes of bleeding or bruising.  No fever, chills, cough, rash, dizziness, SOB, chest pain, palpitations or changes in bowel or bladder habits.  She has had some right sided abdominal discomfort and "belching." She has positive bowel sounds in all 4 quadrants. She has occasional constipation and takes milk of magnesia as needed which helps.  She had some dizziness and nausea this morning without vomiting that passed without intervention.  Her appetite is improved now that the stomatitis has resolved. She is staying well hydrated. Her weight is up 1 lb since her last visit.   Medications:    Medication List       Accurate as of 10/16/15 12:54 PM. Always use your most recent med list.          acetaminophen 500 MG tablet Commonly known as:  TYLENOL Take 1,000 mg by mouth daily as needed for mild pain or fever. Reported on 07/24/2015   ADVAIR DISKUS 100-50 MCG/DOSE Aepb Generic drug:  Fluticasone-Salmeterol Inhale 1 puff into the lungs 2 (two) times daily before a meal.   albuterol 90 MCG/ACT inhaler Commonly known as:  PROVENTIL,VENTOLIN Inhale 2 puffs into the  lungs every 4 (four) hours as needed for wheezing.   amLODipine 5 MG tablet Commonly known as:  NORVASC Take 5 mg by mouth daily.   cabozantinib S-Malate 40 MG Tabs Commonly known as:  CABOMETYX Take 40 mg by mouth daily.   CALCIUM + D PO Take 1 tablet by mouth daily.   dexamethasone 0.5 MG/5ML solution Commonly known as:  DECADRON Take 10 mLs (1 mg total) by mouth QID.   dicyclomine 20 MG tablet Commonly known as:  BENTYL Take 20 mg by mouth 3 (three) times daily as needed (Stomach Cramps).   fentaNYL 12 MCG/HR Commonly known as:  DURAGESIC - dosed mcg/hr Place 2 patches (25 mcg total) onto the skin every 3 (three) days.   HYDROcodone-acetaminophen 5-325 MG tablet Commonly known as:  NORCO/VICODIN Take 1-2 tablets by mouth every 4 (four) hours as needed for moderate pain or severe pain.   Magnesium 250 MG Tabs Take 250 mg by mouth daily.   meclizine 25 MG tablet Commonly known as:  ANTIVERT Take 12.5 mg by mouth 3 (three) times daily as needed. For dizziness.   metoCLOPramide 10 MG tablet Commonly known as:  REGLAN Take 1 tablet (10 mg total) by mouth every 6 (six) hours as needed for nausea.   montelukast 10 MG tablet Commonly known as:  SINGULAIR Take 10 mg by mouth at bedtime.   nystatin 100000 UNIT/ML suspension Commonly known as:  MYCOSTATIN Take 5 mLs (500,000 Units total) by mouth  4 (four) times daily.   OSTEO BI-FLEX ADV DOUBLE ST PO Take 1 tablet by mouth daily.   pilocarpine 5 MG tablet Commonly known as:  SALAGEN Take 1 tablet (5 mg total) by mouth 2 (two) times daily.   PROBIOTIC DAILY PO Take 1 tablet by mouth daily.   ranitidine 150 MG tablet Commonly known as:  ZANTAC Take 150 mg by mouth 2 (two) times daily.   sulfamethoxazole-trimethoprim 800-160 MG tablet Commonly known as:  BACTRIM DS,SEPTRA DS Take 1 tablet by mouth 2 (two) times daily.   vitamin C 500 MG tablet Commonly known as:  ASCORBIC ACID Take 500 mg by mouth daily.     XGEVA 120 MG/1.7ML Soln injection Generic drug:  denosumab Inject 120 mg into the skin every 30 (thirty) days.       Allergies:  Allergies  Allergen Reactions  . Azithromycin Other (See Comments)    Stomach cramps.  Green black stool   . Ciprofloxacin Other (See Comments)    Body aches Chills Heart races  . Etodolac Palpitations and Other (See Comments)    Joints hurt   . Gnp [Acetaminophen] Other (See Comments)    "GRN" (cough syrup) Heart races  . Moxifloxacin Palpitations and Other (See Comments)    Body ache  . Tramadol Nausea And Vomiting and Other (See Comments)    Dizziness  . Rofecoxib Other (See Comments)    Pt unsure     Past Medical History, Surgical history, Social history, and Family History were reviewed and updated.  Review of Systems: All other 10 point review of systems is negative.   Physical Exam:  weight is 120 lb (54.4 kg). Her oral temperature is 97.9 F (36.6 C). Her blood pressure is 117/60 and her pulse is 86.   Wt Readings from Last 3 Encounters:  10/16/15 120 lb (54.4 kg)  10/02/15 119 lb (54 kg)  09/25/15 118 lb (53.5 kg)    Ocular: Sclerae unicteric, pupils equal, round and reactive to light Ear-nose-throat: Oropharynx clear, dentition fair Lymphatic: No cervical supraclavicular or axillary adenopathy Lungs no rales or rhonchi, good excursion bilaterally Heart regular rate and rhythm, no murmur appreciated Abd soft, nontender, positive bowel sounds MSK Chronic back pain and tenderness due to metastatic disease, no joint edema Neuro: non-focal, well-oriented, appropriate affect Breasts: Deferred  Lab Results  Component Value Date   WBC 4.7 10/16/2015   HGB 11.1 (L) 10/16/2015   HCT 34.1 (L) 10/16/2015   MCV 88 10/16/2015   PLT 184 10/16/2015   No results found for: FERRITIN, IRON, TIBC, UIBC, IRONPCTSAT Lab Results  Component Value Date   RBC 3.87 10/16/2015   No results found for: KPAFRELGTCHN, LAMBDASER,  KAPLAMBRATIO No results found for: IGGSERUM, IGA, IGMSERUM No results found for: Odetta Pink, SPEI   Chemistry      Component Value Date/Time   NA 136 10/16/2015 1047   NA 141 05/29/2015 0850   K 4.1 10/16/2015 1047   K 3.5 05/29/2015 0850   CL 105 10/16/2015 1047   CO2 26 10/16/2015 1047   CO2 29 05/29/2015 0850   BUN 22 10/16/2015 1047   BUN 26.7 (H) 05/29/2015 0850   CREATININE 1.2 10/16/2015 1047   CREATININE 1.8 (H) 05/29/2015 0850   GLU 104 07/20/2008      Component Value Date/Time   CALCIUM 8.7 10/16/2015 1047   CALCIUM 10.6 (H) 05/29/2015 0850   ALKPHOS 79 10/16/2015 1047   ALKPHOS 80 05/29/2015  0850   AST 24 10/16/2015 1047   AST 24 05/29/2015 0850   ALT 27 10/16/2015 1047   ALT 18 05/29/2015 0850   BILITOT 0.70 10/16/2015 1047   BILITOT 0.76 05/29/2015 0850     Impression and Plan: Ms. Lewin is a very pleasant 70 yo white female with metastatic clear cell renal carcinoma diagnosed in 2013. She was on Cabometyx and has developed oral thrush/stomatitis. She has stopped the Cabometyx and has not yet restarted. She is still feeling fatigued and having some intermittent right sided abdominal pain and left should pain.  We will repeat CT scans of the chest, abdomen and pelvis as well as a CT of the left shoulder. We will also get a bone scan.  We will see what these show and then see if we cant try to get her back onto the Cabometyx.  We will plan to see her back in 2 weeks, after labor day, for repeat lab work and follow-up.  She will contact us with any questions or concerns. We can certainly see her sooner if need be.   Eliezer Bottom, NP 8/22/201712:54 PM

## 2015-10-17 ENCOUNTER — Other Ambulatory Visit: Payer: Self-pay | Admitting: Family

## 2015-10-17 ENCOUNTER — Encounter (HOSPITAL_BASED_OUTPATIENT_CLINIC_OR_DEPARTMENT_OTHER): Payer: Self-pay

## 2015-10-17 ENCOUNTER — Emergency Department (HOSPITAL_BASED_OUTPATIENT_CLINIC_OR_DEPARTMENT_OTHER)
Admission: EM | Admit: 2015-10-17 | Discharge: 2015-10-17 | Disposition: A | Discharge status: Home / Self Care | Payer: Medicare PPO | Attending: Emergency Medicine | Admitting: Emergency Medicine

## 2015-10-17 ENCOUNTER — Emergency Department (HOSPITAL_BASED_OUTPATIENT_CLINIC_OR_DEPARTMENT_OTHER): Payer: Medicare PPO

## 2015-10-17 DIAGNOSIS — Z85830 Personal history of malignant neoplasm of bone: Secondary | ICD-10-CM | POA: Insufficient documentation

## 2015-10-17 DIAGNOSIS — C7951 Secondary malignant neoplasm of bone: Principal | ICD-10-CM

## 2015-10-17 DIAGNOSIS — L03116 Cellulitis of left lower limb: Secondary | ICD-10-CM | POA: Diagnosis not present

## 2015-10-17 DIAGNOSIS — Z87891 Personal history of nicotine dependence: Secondary | ICD-10-CM | POA: Insufficient documentation

## 2015-10-17 DIAGNOSIS — I10 Essential (primary) hypertension: Secondary | ICD-10-CM | POA: Diagnosis not present

## 2015-10-17 DIAGNOSIS — Z85528 Personal history of other malignant neoplasm of kidney: Secondary | ICD-10-CM | POA: Insufficient documentation

## 2015-10-17 DIAGNOSIS — C649 Malignant neoplasm of unspecified kidney, except renal pelvis: Secondary | ICD-10-CM

## 2015-10-17 DIAGNOSIS — J45909 Unspecified asthma, uncomplicated: Secondary | ICD-10-CM | POA: Diagnosis not present

## 2015-10-17 DIAGNOSIS — Z79899 Other long term (current) drug therapy: Secondary | ICD-10-CM | POA: Diagnosis not present

## 2015-10-17 DIAGNOSIS — M7989 Other specified soft tissue disorders: Secondary | ICD-10-CM | POA: Diagnosis present

## 2015-10-17 DIAGNOSIS — M25512 Pain in left shoulder: Secondary | ICD-10-CM

## 2015-10-17 MED ORDER — CEPHALEXIN 500 MG PO CAPS: 500.0000 mg | ORAL_CAPSULE | Freq: Two times a day (BID) | ORAL | 0 refills | Status: DC

## 2015-10-17 NOTE — ED Notes (Signed)
Patient transported to Ultrasound 

## 2015-10-17 NOTE — ED Notes (Signed)
Pt reports pain in left knee since Friday. Has been on chemo for renal CA. Noticed redness to shin today. Concerned for blood clot

## 2015-10-17 NOTE — ED Notes (Signed)
Pt verbalizes understanding of d/c instructions and denies any further needs at this time. 

## 2015-10-17 NOTE — ED Notes (Signed)
PA at bedside.

## 2015-10-17 NOTE — ED Provider Notes (Signed)
[facility] Provider Note   CSN: [id number] Arrival date & time: 10/17/15  1825  By signing my name below, I, Royce Macadamia, attest that this documentation has been prepared under the direction and in the presence of  Waynetta Pean, PA-C. Electronically Signed: Royce Macadamia, ED Scribe. 10/17/15. 7:02 PM.  History   Chief Complaint Chief Complaint  Patient presents with  . Leg Swelling   The history is provided by the patient. No language interpreter was used.   HPI Comments:  Cathy Lewis is a 70 y.o. female who presents to the Emergency Department complaining of gradually worsening swelling on her anterior left knee and left ankle beginning 5 days ago. Patient is currently treated for renal cancer. She is not currently on chemotherapy. She was sent to the emergency room for rule out DVT. She denies any known injury or trauma to her leg. She denies fever, chest pain, SOB, nausea, vomiting, and abdominal pain.  She also denies h/o blood clotting disorders, smoking, long travel and injury.  No alleviating factors noted.  No additional symptoms or complaints.    Past Medical History:  Diagnosis Date  . Arthritis    arthritis in knees and back   . Asthma   . Atypical chest pain    NORMAL CARDIOLITE STUDY   . Complication of anesthesia    hard to wake up from umb hernia-was given alot of meds  . Depression   . Dyslipidemia   . GERD (gastroesophageal reflux disease)   . Headache   . Hypertension   . Metastatic renal cell carcinoma to bone ([place]) 11/20/2014  . Seasonal allergies   . Syncope    HISTORY OF    Patient Active Problem List   Diagnosis Date Noted  . Cough variant asthma 05/02/2015  . Metastatic renal cell carcinoma to bone ([place]) 11/20/2014  . Malignant neoplasm metastatic to bone ([place]) 08/29/2014  . H/O therapeutic radiation 08/29/2014  . Carcinoma of kidney ([place]) 08/29/2014  . LBP (low back pain) 08/29/2014  . Atypical chest pain   .  Dyslipidemia   . Hypertension   . Syncope     Past Surgical History:  Procedure Laterality Date  . ABDOMINAL HYSTERECTOMY  2004   rectocele  . APPENDECTOMY    . BUNIONECTOMY Left 01-11-2013   left foot  . BUNIONECTOMY Right 12-31-2006   with hammer toe  . CARPAL TUNNEL RELEASE     R  . CHOLECYSTECTOMY N/A 08/27/2015   Procedure: LAPAROSCOPIC CHOLECYSTECTOMY WITH INTRAOPERATIVE CHOLANGIOGRAM;  Surgeon: [name], MD;  Location: [facility];  Service: General;  Laterality: N/A;  . COLONOSCOPY    . CYSTOCELE REPAIR    . DILATION AND CURETTAGE OF UTERUS    . HERNIA REPAIR  2014    umb, right and left  . INGUINAL HERNIA REPAIR Left 01/03/2014   Procedure: LEFT INGUINAL HERNIA REPAIR ;  Surgeon: [name], MD;  Location: [facility];  Service: General;  Laterality: Left;  . INGUINAL HERNIA REPAIR Right 04/06/2015   Procedure: HERNIA REPAIR RIGHT  INGUINAL ADULT WITH MESH;  Surgeon: [name], MD;  Location: [facility];  Service: General;  Laterality: Right;  . INSERTION OF MESH Left 01/03/2014   Procedure: INSERTION OF MESH;  Surgeon: [name], MD;  Location: [facility];  Service: General;  Laterality: Left;  . INSERTION OF MESH Right 04/06/2015   Procedure: INSERTION OF MESH;  Surgeon: [name], MD;  Location: [facility];  Service: General;  Laterality: Right;  . JOINT REPLACEMENT    . 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 OTHER SURGICAL HISTORY     bladder vault prolapse  . OTHER SURGICAL HISTORY     right foot bunion and hammer toe surgery   . OTHER SURGICAL HISTORY     right knee arthroscopic   . OTHER SURGICAL HISTORY     right arm ( underarm ) surgery removed ? cyst   . TOTAL KNEE ARTHROPLASTY  03/11/2011   Procedure: TOTAL KNEE ARTHROPLASTY;  Surgeon: Cynda Familia, MD;  Location: [facility];  Service: Orthopedics;  Laterality: Right;  . TUBAL LIGATION     1977    OB History    No data available       Home  Medications    Prior to Admission medications   Medication Sig Start Date End Date Taking? Authorizing Provider  acetaminophen (TYLENOL) 500 MG tablet Take 1,000 mg by mouth daily as needed for mild pain or fever. Reported on 07/24/2015    Historical Provider, MD  albuterol (PROVENTIL,VENTOLIN) 90 MCG/ACT inhaler Inhale 2 puffs into the lungs every 4 (four) hours as needed for wheezing.     Historical Provider, MD  amLODipine (NORVASC) 5 MG tablet Take 5 mg by mouth daily.  07/28/15   Historical Provider, MD  cabozantinib S-Malate (CABOMETYX) 40 MG TABS Take 40 mg by mouth daily. 08/03/15   Volanda Napoleon, MD  Calcium Carbonate-Vitamin D (CALCIUM + D PO) Take 1 tablet by mouth daily.     Historical Provider, MD  cephALEXin (KEFLEX) 500 MG capsule Take 1 capsule (500 mg total) by mouth 2 (two) times daily. 10/17/15   Waynetta Pean, PA-C  denosumab (XGEVA) 120 MG/1.7ML SOLN injection Inject 120 mg into the skin every 30 (thirty) days.    Historical Provider, MD  dexamethasone (DECADRON) 0.5 MG/5ML solution Take 10 mLs (1 mg total) by mouth QID. 10/02/15   Eliezer Bottom, NP  dicyclomine (BENTYL) 20 MG tablet Take 20 mg by mouth 3 (three) times daily as needed (Stomach Cramps).    Historical Provider, MD  fentaNYL (DURAGESIC - DOSED MCG/HR) 12 MCG/HR Place 2 patches (25 mcg total) onto the skin every 3 (three) days. Patient taking differently: Place 12.5 mcg onto the skin every 3 (three) days.  07/27/15   Volanda Napoleon, MD  Fluticasone-Salmeterol (ADVAIR DISKUS) 100-50 MCG/DOSE AEPB Inhale 1 puff into the lungs 2 (two) times daily before a meal.    Historical Provider, MD  HYDROcodone-acetaminophen (NORCO/VICODIN) 5-325 MG tablet Take 1-2 tablets by mouth every 4 (four) hours as needed for moderate pain or severe pain. Patient taking differently: Take 0.5 tablets by mouth daily as needed for moderate pain or severe pain.  04/06/15   [name], MD  Magnesium 250 MG TABS Take 250 mg by mouth daily.     Historical Provider, MD  meclizine (ANTIVERT) 25 MG tablet Take 12.5 mg by mouth 3 (three) times daily as needed. For dizziness.    Historical Provider, MD  metoCLOPramide (REGLAN) 10 MG tablet Take 1 tablet (10 mg total) by mouth every 6 (six) hours as needed for nausea. 07/25/15   Eliezer Bottom, NP  Misc Natural Products (OSTEO BI-FLEX ADV DOUBLE ST PO) Take 1 tablet by mouth daily.    Historical Provider, MD  montelukast (SINGULAIR) 10 MG tablet Take 10 mg by mouth at bedtime.     Historical Provider, MD  nystatin (MYCOSTATIN) 100000 UNIT/ML suspension Take 5  mLs (500,000 Units total) by mouth 4 (four) times daily. 09/25/15   Eliezer Bottom, NP  pilocarpine (SALAGEN) 5 MG tablet Take 1 tablet (5 mg total) by mouth 2 (two) times daily. 10/02/15   Eliezer Bottom, NP  Probiotic Product (PROBIOTIC DAILY PO) Take 1 tablet by mouth daily.     Historical Provider, MD  ranitidine (ZANTAC) 150 MG tablet Take 150 mg by mouth 2 (two) times daily.    Historical Provider, MD  sulfamethoxazole-trimethoprim (BACTRIM DS,SEPTRA DS) 800-160 MG tablet Take 1 tablet by mouth 2 (two) times daily. 09/25/15   Eliezer Bottom, NP  vitamin C (ASCORBIC ACID) 500 MG tablet Take 500 mg by mouth daily.    Historical Provider, MD    Family History Family History  Problem Relation Age of Onset  . Heart attack Father     DECEASED  . Cancer Mother     DECEASED-unsure of type  . Cancer Brother     LUNG CA- smoked  . Aneurysm Brother     AAA  . Asthma Sister     Social History Social History  Substance Use Topics  . Smoking status: Former Smoker    Packs/day: 0.25    Years: 10.00    Quit date: 02/24/1985  . Smokeless tobacco: Never Used  . Alcohol use No     Allergies   Azithromycin; Ciprofloxacin; Etodolac; Gnp [acetaminophen]; Moxifloxacin; Tramadol; and Rofecoxib   Review of Systems Review of Systems  Constitutional: Negative for chills and fever.  HENT: Negative for congestion and sore  throat.   Eyes: Negative for visual disturbance.  Respiratory: Negative for cough, shortness of breath and wheezing.   Cardiovascular: Positive for leg swelling. Negative for chest pain and palpitations.  Gastrointestinal: Negative for abdominal pain, diarrhea, nausea and vomiting.  Genitourinary: Negative for dysuria.  Musculoskeletal: Positive for joint swelling (left knee and ankle). Negative for back pain and neck pain.  Skin: Negative for rash.  Neurological: Negative for weakness, light-headedness, numbness and headaches.     Physical Exam Updated Vital Signs BP 113/67 (BP Location: Right Arm)   Pulse 102   Temp 98.8 F (37.1 C) (Oral)   Resp 20   Ht 5\' 4"  (1.626 m)   Wt 54.9 kg   SpO2 95%   BMI 20.77 kg/m   Physical Exam  Constitutional: She is oriented to person, place, and time. She appears well-developed and well-nourished. No distress.  Nontoxic-appearing.  HENT:  Head: Normocephalic and atraumatic.  Mouth/Throat: Oropharynx is clear and moist.  Eyes: Conjunctivae are normal. Pupils are equal, round, and reactive to light. Right eye exhibits no discharge. Left eye exhibits no discharge.  Neck: Neck supple.  Cardiovascular: Normal rate, regular rhythm, normal heart sounds and intact distal pulses.  Exam reveals no gallop and no friction rub.   No murmur heard. Pulses:      Dorsalis pedis pulses are 2+ on the right side, and 2+ on the left side.       Posterior tibial pulses are 2+ on the right side, and 2+ on the left side.  HR is 92. Bilateral radial, posterior tibialis and dorsalis pedis pulses are intact.    Pulmonary/Chest: Effort normal and breath sounds normal. No respiratory distress. She has no wheezes. She has no rales.  Abdominal: Soft. There is no tenderness.  Musculoskeletal: Normal range of motion. She exhibits edema. She exhibits no tenderness or deformity.  Mild left knee and left ankle pedal edema.  No calf edema  or tenderness. Mild erythema  overlying her left anterior shin. No right calf edema or tenderness.  Lymphadenopathy:    She has no cervical adenopathy.  Neurological: She is alert and oriented to person, place, and time. Coordination normal.  Sensation is intact her bilateral distal extremities.  Skin: Skin is warm and dry. Capillary refill takes less than 2 seconds. No rash noted. She is not diaphoretic. There is erythema. No pallor.  Psychiatric: She has a normal mood and affect. Her behavior is normal.  Nursing note and vitals reviewed.    ED Treatments / Results   DIAGNOSTIC STUDIES:  Oxygen Saturation is 98% on RA, nml by my interpretation.    COORDINATION OF CARE:  6:59 PM Discussed treatment plan with pt at bedside and pt agreed to plan.  Labs (all labs ordered are listed, but only abnormal results are displayed) Labs Reviewed - No data to display  EKG  EKG Interpretation None       Radiology US Venous Img Lower Unilateral Left  Result Date: 10/17/2015 CLINICAL DATA:  Left leg swelling for 4-5 days. EXAM: LEFT LOWER EXTREMITY VENOUS DOPPLER ULTRASOUND TECHNIQUE: Gray-scale sonography with graded compression, as well as color Doppler and duplex ultrasound were performed to evaluate the lower extremity deep venous systems from the level of the common femoral vein and including the common femoral, femoral, profunda femoral, popliteal and calf veins including the posterior tibial, peroneal and gastrocnemius veins when visible. The superficial great saphenous vein was also interrogated. Spectral Doppler was utilized to evaluate flow at rest and with distal augmentation maneuvers in the common femoral, femoral and popliteal veins. COMPARISON:  None. FINDINGS: Contralateral Common Femoral Vein: Respiratory phasicity is normal and symmetric with the symptomatic side. No evidence of thrombus. Normal compressibility. Common Femoral Vein: No evidence of thrombus. Saphenofemoral Junction: No evidence of thrombus.  Profunda Femoral Vein: No evidence of thrombus. Femoral Vein: No evidence of thrombus. Popliteal Vein: No evidence of thrombus. Calf Veins: No evidence of thrombus. Superficial Great Saphenous Vein: No evidence of thrombus. Venous Reflux:  None. Other Findings:  None. IMPRESSION: Negative for left lower extremity DVT. Electronically Signed   By: Monte Fantasia M.D.   On: 10/17/2015 20:07    Procedures Procedures (including critical care time)  Medications Ordered in ED Medications - No data to display   Initial Impression / Assessment and Plan / ED Course  I have reviewed the triage vital signs and the nursing notes.  Pertinent labs & imaging results that were available during my care of the patient were reviewed by me and considered in my medical decision making (see chart for details).  Clinical Course   This is a 70 y.o. female who presents to the Emergency Department complaining of gradually worsening swelling on her anterior left knee and left ankle beginning 5 days ago. Patient is currently treated for renal cancer. She is not currently on chemotherapy. She was sent to the emergency room for rule out DVT. She denies any known injury or trauma to her leg. She denies fever, chest pain, SOB. No history of DVTs or PEs. On exam the patient is afebrile nontoxic appearing. She has mild edema over her left anterior knee and her left ankle and foot. No calf edema or tenderness. She has very slight overlying erythema over her left anterior shin. DVT study was unremarkable. Blood work from yesterday is unremarkable. No evidence of DVT. Will treat with Keflex for cellulitis. I advised the patient to follow-up with their primary  care provider this week. I advised the patient to return to the emergency department with new or worsening symptoms or new concerns. The patient verbalized understanding and agreement with plan.    This patient was discussed with and evaluated by Dr. Gilford Raid who agrees with  assessment and plan.   Final Clinical Impressions(s) / ED Diagnoses   Final diagnoses:  Cellulitis of left lower extremity    New Prescriptions New Prescriptions   CEPHALEXIN (KEFLEX) 500 MG CAPSULE    Take 1 capsule (500 mg total) by mouth 2 (two) times daily.   I personally performed the services described in this documentation, which was scribed in my presence. The recorded information has been reviewed and is accurate.       Waynetta Pean, PA-C 10/17/15 2120    Isla Pence, MD 10/17/15 [phone]

## 2015-10-17 NOTE — ED Triage Notes (Signed)
C/o swelling to left foot/ankle/knee x 5 days-denies injury-was sent from PCP to r/o "blood clot" per pt-NAD-steady gait

## 2015-10-22 ENCOUNTER — Ambulatory Visit (HOSPITAL_BASED_OUTPATIENT_CLINIC_OR_DEPARTMENT_OTHER)
Admission: RE | Admit: 2015-10-22 | Discharge: 2015-10-22 | Disposition: A | Discharge status: Home / Self Care | Payer: Medicare PPO | Source: Ambulatory Visit | Attending: Family | Admitting: Family

## 2015-10-22 ENCOUNTER — Other Ambulatory Visit: Payer: Self-pay | Admitting: Family

## 2015-10-22 ENCOUNTER — Other Ambulatory Visit: Payer: Self-pay | Admitting: *Deleted

## 2015-10-22 ENCOUNTER — Ambulatory Visit (HOSPITAL_BASED_OUTPATIENT_CLINIC_OR_DEPARTMENT_OTHER): Payer: Medicare PPO | Admitting: Family

## 2015-10-22 ENCOUNTER — Encounter: Payer: Self-pay | Admitting: Family

## 2015-10-22 ENCOUNTER — Telehealth: Payer: Self-pay | Admitting: *Deleted

## 2015-10-22 VITALS — BP 104/62 | HR 95 | Temp 97.8°F | Resp 16 | Ht 64.0 in | Wt 121.0 lb

## 2015-10-22 DIAGNOSIS — C649 Malignant neoplasm of unspecified kidney, except renal pelvis: Secondary | ICD-10-CM

## 2015-10-22 DIAGNOSIS — M25462 Effusion, left knee: Secondary | ICD-10-CM

## 2015-10-22 DIAGNOSIS — M25562 Pain in left knee: Secondary | ICD-10-CM

## 2015-10-22 DIAGNOSIS — C7951 Secondary malignant neoplasm of bone: Principal | ICD-10-CM

## 2015-10-22 DIAGNOSIS — M1712 Unilateral primary osteoarthritis, left knee: Secondary | ICD-10-CM | POA: Insufficient documentation

## 2015-10-22 MED ORDER — TRAMADOL HCL 50 MG PO TABS: 50.0000 mg | ORAL_TABLET | Freq: Two times a day (BID) | ORAL | 0 refills | Status: DC | PRN

## 2015-10-22 NOTE — Telephone Encounter (Signed)
Patient called stating that her right knee was very swollen and can hardly walk. Patient very teary.  Talked with [name] .  To see Judson Roch today and have an Xray of right knee

## 2015-10-22 NOTE — Progress Notes (Signed)
Hematology and Oncology Follow Up Visit  Cathy Lewis [id number] 1946-02-22 70 y.o. 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 She went to the ED last week for this same issue and has been on Keflex for what was thought to be cellulitis. X-ray today showed a large joint effusion. We have contacted Dr. Novella Olive and he will drain this today in his office.  We will get her started back on Cabometyx 40 mg every other day.  No other swelling, tenderness, numbness or tingling in her extremities.  No lymphadenopathy found on exam. No episodes of bleeding or bruising.  No fever, chills, n/v, cough, rash, dizziness, SOB, chest pain, palpitations or changes in bowel or bladder habits.  Her appetite is good and she is staying well hydrated. Her weight is stable.   Medications:    Medication List       Accurate as of 10/22/15  3:01 PM. Always use your most recent med list.          acetaminophen 500 MG tablet Commonly known as:  TYLENOL Take 1,000 mg by mouth daily as needed for mild pain or fever. Reported on 07/24/2015   ADVAIR DISKUS 100-50 MCG/DOSE Aepb Generic drug:  Fluticasone-Salmeterol Inhale 1 puff into the lungs 2 (two) times daily before a meal.   albuterol 90 MCG/ACT inhaler Commonly known as:  PROVENTIL,VENTOLIN Inhale 2 puffs into the lungs every 4 (four) hours as needed for wheezing.   amLODipine 5 MG tablet Commonly known as:  NORVASC Take 5 mg by mouth daily.   cabozantinib S-Malate 40 MG Tabs Commonly known as:  CABOMETYX Take 40 mg by mouth daily.   CALCIUM + D PO Take 1 tablet by mouth daily.   cephALEXin 500 MG capsule Commonly known as:  KEFLEX Take 1 capsule (500 mg total) by mouth 2 (two)  times daily.   dexamethasone 0.5 MG/5ML solution Commonly known as:  DECADRON Take 10 mLs (1 mg total) by mouth QID.   dicyclomine 20 MG tablet Commonly known as:  BENTYL Take 20 mg by mouth 3 (three) times daily as needed (Stomach Cramps).   fentaNYL 12 MCG/HR Commonly known as:  DURAGESIC - dosed mcg/hr Place 2 patches (25 mcg total) onto the skin every 3 (three) days.   HYDROcodone-acetaminophen 5-325 MG tablet Commonly known as:  NORCO/VICODIN Take 1-2 tablets by mouth every 4 (four) hours as needed for moderate pain or severe pain.   Magnesium 250 MG Tabs Take 250 mg by mouth daily.   meclizine 25 MG tablet Commonly known as:  ANTIVERT Take 12.5 mg by mouth 3 (three) times daily as needed. For dizziness.   metoCLOPramide 10 MG tablet Commonly known as:  REGLAN Take 1 tablet (10 mg total) by mouth every 6 (six) hours as needed for nausea.   montelukast 10 MG tablet Commonly known as:  SINGULAIR Take 10 mg by mouth at bedtime.   nystatin 100000 UNIT/ML suspension Commonly known as:  MYCOSTATIN Take 5 mLs (500,000 Units total) by mouth 4 (four) times daily.   OSTEO BI-FLEX ADV DOUBLE ST PO Take 1 tablet by mouth daily.   pilocarpine 5 MG tablet Commonly known as:  SALAGEN Take 1 tablet (  5 mg total) by mouth 2 (two) times daily.   PROBIOTIC DAILY PO Take 1 tablet by mouth daily.   ranitidine 150 MG tablet Commonly known as:  ZANTAC Take 150 mg by mouth 2 (two) times daily.   sulfamethoxazole-trimethoprim 800-160 MG tablet Commonly known as:  BACTRIM DS,SEPTRA DS Take 1 tablet by mouth 2 (two) times daily.   traMADol 50 MG tablet Commonly known as:  ULTRAM Take 1 tablet (50 mg total) by mouth every 12 (twelve) hours as needed.   vitamin C 500 MG tablet Commonly known as:  ASCORBIC ACID Take 500 mg by mouth daily.   XGEVA 120 MG/1.7ML Soln injection Generic drug:  denosumab Inject 120 mg into the skin every 30 (thirty) days.       Allergies:    Allergies  Allergen Reactions  . Azithromycin Other (See Comments)    Stomach cramps.  Green black stool   . Ciprofloxacin Other (See Comments)    Body aches Chills Heart races  . Etodolac Palpitations and Other (See Comments)    Joints hurt   . Gnp [Acetaminophen] Other (See Comments)    "GRN" (cough syrup) Heart races  . Moxifloxacin Palpitations and Other (See Comments)    Body ache  . Tramadol Nausea And Vomiting and Other (See Comments)    Dizziness  . Rofecoxib Other (See Comments)    Pt unsure     Past Medical History, Surgical history, Social history, and Family History were reviewed and updated.  Review of Systems: All other 10 point review of systems is negative.   Physical Exam:  height is 5\' 4"  (1.626 m) and weight is 121 lb (54.9 kg). Her oral temperature is 97.8 F (36.6 C). Her blood pressure is 104/62 and her pulse is 95. Her respiration is 16.   Wt Readings from Last 3 Encounters:  10/22/15 121 lb (54.9 kg)  10/17/15 121 lb (54.9 kg)  10/16/15 120 lb (54.4 kg)    Ocular: Sclerae unicteric, pupils equal, round and reactive to light Ear-nose-throat: Oropharynx clear, dentition fair Lymphatic: No cervical supraclavicular or axillary adenopathy Lungs no rales or rhonchi, good excursion bilaterally Heart regular rate and rhythm, no murmur appreciated Abd soft, nontender, positive bowel sounds, no liver or spleen tip palpated on exam  MSK Chronic back pain and tenderness due to metastatic disease, no joint edema Neuro: non-focal, well-oriented, appropriate affect Breasts: Deferred  Lab Results  Component Value Date   WBC 4.7 10/16/2015   HGB 11.1 (L) 10/16/2015   HCT 34.1 (L) 10/16/2015   MCV 88 10/16/2015   PLT 184 10/16/2015   No results found for: FERRITIN, IRON, TIBC, UIBC, IRONPCTSAT Lab Results  Component Value Date   RBC 3.87 10/16/2015   No results found for: KPAFRELGTCHN, LAMBDASER, KAPLAMBRATIO No results found for: IGGSERUM, IGA,  IGMSERUM No results found for: Odetta Pink, SPEI   Chemistry      Component Value Date/Time   NA 136 10/16/2015 1047   NA 141 05/29/2015 0850   K 4.1 10/16/2015 1047   K 3.5 05/29/2015 0850   CL 105 10/16/2015 1047   CO2 26 10/16/2015 1047   CO2 29 05/29/2015 0850   BUN 22 10/16/2015 1047   BUN 26.7 (H) 05/29/2015 0850   CREATININE 1.2 10/16/2015 1047   CREATININE 1.8 (H) 05/29/2015 0850   GLU 104 07/20/2008      Component Value Date/Time   CALCIUM 8.7 10/16/2015 1047   CALCIUM 10.6 (H)  05/29/2015 0850   ALKPHOS 79 10/16/2015 1047   ALKPHOS 80 05/29/2015 0850   AST 24 10/16/2015 1047   AST 24 05/29/2015 0850   ALT 27 10/16/2015 1047   ALT 18 05/29/2015 0850   BILITOT 0.70 10/16/2015 1047   BILITOT 0.76 05/29/2015 0850     Impression and Plan: Cathy Lewis is a very pleasant 70 yo white female with metastatic clear cell renal carcinoma diagnosed in 2013. She is here today with pain in her left knee. X-ray confirmed a large joint effusion. Dr. Novella Olive will see her today and drain the fluid off her knee in his office.  Prescription for Ultram 50 mg PO Q 12 hours PRN for pain was called into her pharmacy.  We will have her restart her Cabometyx at 40 mg PO every other day.  We will restart her Xgeva injections at her next appointment on 10/30/15 and will see her back that same day for repeat lab work and follow-up.  She will contact our office with any questions or concerns. We can certainly see her sooner if need be.   Eliezer Bottom, NP 8/28/20173:01 PM

## 2015-10-26 ENCOUNTER — Other Ambulatory Visit: Payer: Self-pay

## 2015-10-26 MED ORDER — METHOCARBAMOL 500 MG PO TABS: 500.0000 mg | ORAL_TABLET | Freq: Four times a day (QID) | ORAL | 3 refills | Status: DC | PRN

## 2015-10-30 ENCOUNTER — Encounter: Payer: Self-pay | Admitting: Family

## 2015-10-30 ENCOUNTER — Ambulatory Visit (HOSPITAL_BASED_OUTPATIENT_CLINIC_OR_DEPARTMENT_OTHER): Payer: Medicare PPO | Admitting: Family

## 2015-10-30 ENCOUNTER — Other Ambulatory Visit (HOSPITAL_BASED_OUTPATIENT_CLINIC_OR_DEPARTMENT_OTHER): Payer: Medicare PPO

## 2015-10-30 ENCOUNTER — Ambulatory Visit (HOSPITAL_BASED_OUTPATIENT_CLINIC_OR_DEPARTMENT_OTHER): Payer: Medicare PPO

## 2015-10-30 VITALS — BP 107/57 | HR 83 | Temp 98.3°F | Resp 18 | Ht 64.0 in | Wt 118.0 lb

## 2015-10-30 DIAGNOSIS — C7951 Secondary malignant neoplasm of bone: Secondary | ICD-10-CM

## 2015-10-30 DIAGNOSIS — C649 Malignant neoplasm of unspecified kidney, except renal pelvis: Secondary | ICD-10-CM

## 2015-10-30 DIAGNOSIS — C7800 Secondary malignant neoplasm of unspecified lung: Secondary | ICD-10-CM | POA: Diagnosis not present

## 2015-10-30 DIAGNOSIS — C7972 Secondary malignant neoplasm of left adrenal gland: Secondary | ICD-10-CM

## 2015-10-30 DIAGNOSIS — C7971 Secondary malignant neoplasm of right adrenal gland: Secondary | ICD-10-CM

## 2015-10-30 DIAGNOSIS — M25512 Pain in left shoulder: Secondary | ICD-10-CM

## 2015-10-30 LAB — CMP (CANCER CENTER ONLY)
ALBUMIN: 3.3 g/dL (ref 3.3–5.5)
ALT(SGPT): 25 U/L (ref 10–47)
AST: 25 U/L (ref 11–38)
Alkaline Phosphatase: 83 U/L (ref 26–84)
BILIRUBIN TOTAL: 0.6 mg/dL (ref 0.20–1.60)
BUN, Bld: 27 mg/dL — ABNORMAL HIGH (ref 7–22)
CALCIUM: 9.2 mg/dL (ref 8.0–10.3)
CHLORIDE: 101 meq/L (ref 98–108)
CO2: 29 meq/L (ref 18–33)
Creat: 1.4 mg/dl — ABNORMAL HIGH (ref 0.6–1.2)
Glucose, Bld: 103 mg/dL (ref 73–118)
Potassium: 4.7 mEq/L (ref 3.3–4.7)
Sodium: 138 mEq/L (ref 128–145)
Total Protein: 7.4 g/dL (ref 6.4–8.1)

## 2015-10-30 LAB — CBC WITH DIFFERENTIAL (CANCER CENTER ONLY)
BASO#: 0 10*3/uL (ref 0.0–0.2)
BASO%: 0.3 % (ref 0.0–2.0)
EOS ABS: 0 10*3/uL (ref 0.0–0.5)
EOS%: 0.2 % (ref 0.0–7.0)
HEMATOCRIT: 35.7 % (ref 34.8–46.6)
HEMOGLOBIN: 11.3 g/dL — AB (ref 11.6–15.9)
LYMPH#: 1.4 10*3/uL (ref 0.9–3.3)
LYMPH%: 22.2 % (ref 14.0–48.0)
MCH: 28.6 pg (ref 26.0–34.0)
MCHC: 31.7 g/dL — AB (ref 32.0–36.0)
MCV: 90 fL (ref 81–101)
MONO#: 0.9 10*3/uL (ref 0.1–0.9)
MONO%: 14.2 % — ABNORMAL HIGH (ref 0.0–13.0)
NEUT#: 4.1 10*3/uL (ref 1.5–6.5)
NEUT%: 63.1 % (ref 39.6–80.0)
Platelets: 310 10*3/uL (ref 145–400)
RBC: 3.95 10*6/uL (ref 3.70–5.32)
RDW: 20.6 % — ABNORMAL HIGH (ref 11.1–15.7)
WBC: 6.5 10*3/uL (ref 3.9–10.0)

## 2015-10-30 LAB — LACTATE DEHYDROGENASE: LDH: 193 U/L (ref 125–245)

## 2015-10-30 LAB — TECHNOLOGIST REVIEW CHCC SATELLITE

## 2015-10-30 MED ORDER — DENOSUMAB 120 MG/1.7ML ~~LOC~~ SOLN
120.0000 mg | Freq: Once | SUBCUTANEOUS | Status: AC
  Administered 2015-10-30: 120 mg via SUBCUTANEOUS
  Filled 2015-10-30: qty 1.7

## 2015-10-30 NOTE — Patient Instructions (Signed)
Denosumab injection  What is this medicine?  DENOSUMAB (den oh sue mab) slows bone breakdown. Prolia is used to treat osteoporosis in women after menopause and in men. Xgeva is used to prevent bone fractures and other bone problems caused by cancer bone metastases. Xgeva is also used to treat giant cell tumor of the bone.  This medicine may be used for other purposes; ask your health care provider or pharmacist if you have questions.  What should I tell my health care provider before I take this medicine?  They need to know if you have any of these conditions:  -dental disease  -eczema  -infection or history of infections  -kidney disease or on dialysis  -low blood calcium or vitamin D  -malabsorption syndrome  -scheduled to have surgery or tooth extraction  -taking medicine that contains denosumab  -thyroid or parathyroid disease  -an unusual reaction to denosumab, other medicines, foods, dyes, or preservatives  -pregnant or trying to get pregnant  -breast-feeding  How should I use this medicine?  This medicine is for injection under the skin. It is given by a health care professional in a hospital or clinic setting.  If you are getting Prolia, a special MedGuide will be given to you by the pharmacist with each prescription and refill. Be sure to read this information carefully each time.  For Prolia, talk to your pediatrician regarding the use of this medicine in children. Special care may be needed. For Xgeva, talk to your pediatrician regarding the use of this medicine in children. While this drug may be prescribed for children as young as 13 years for selected conditions, precautions do apply.  Overdosage: If you think you have taken too much of this medicine contact a poison control center or emergency room at once.  NOTE: This medicine is only for you. Do not share this medicine with others.  What if I miss a dose?  It is important not to miss your dose. Call your doctor or health care professional if you are  unable to keep an appointment.  What may interact with this medicine?  Do not take this medicine with any of the following medications:  -other medicines containing denosumab  This medicine may also interact with the following medications:  -medicines that suppress the immune system  -medicines that treat cancer  -steroid medicines like prednisone or cortisone  This list may not describe all possible interactions. Give your health care provider a list of all the medicines, herbs, non-prescription drugs, or dietary supplements you use. Also tell them if you smoke, drink alcohol, or use illegal drugs. Some items may interact with your medicine.  What should I watch for while using this medicine?  Visit your doctor or health care professional for regular checks on your progress. Your doctor or health care professional may order blood tests and other tests to see how you are doing.  Call your doctor or health care professional if you get a cold or other infection while receiving this medicine. Do not treat yourself. This medicine may decrease your body's ability to fight infection.  You should make sure you get enough calcium and vitamin D while you are taking this medicine, unless your doctor tells you not to. Discuss the foods you eat and the vitamins you take with your health care professional.  See your dentist regularly. Brush and floss your teeth as directed. Before you have any dental work done, tell your dentist you are receiving this medicine.  Do   not become pregnant while taking this medicine or for 5 months after stopping it. Women should inform their doctor if they wish to become pregnant or think they might be pregnant. There is a potential for serious side effects to an unborn child. Talk to your health care professional or pharmacist for more information.  What side effects may I notice from receiving this medicine?  Side effects that you should report to your doctor or health care professional as soon as  possible:  -allergic reactions like skin rash, itching or hives, swelling of the face, lips, or tongue  -breathing problems  -chest pain  -fast, irregular heartbeat  -feeling faint or lightheaded, falls  -fever, chills, or any other sign of infection  -muscle spasms, tightening, or twitches  -numbness or tingling  -skin blisters or bumps, or is dry, peels, or red  -slow healing or unexplained pain in the mouth or jaw  -unusual bleeding or bruising  Side effects that usually do not require medical attention (Report these to your doctor or health care professional if they continue or are bothersome.):  -muscle pain  -stomach upset, gas  This list may not describe all possible side effects. Call your doctor for medical advice about side effects. You may report side effects to FDA at 1-800-FDA-1088.  Where should I keep my medicine?  This medicine is only given in a clinic, doctor's office, or other health care setting and will not be stored at home.  NOTE: This sheet is a summary. It may not cover all possible information. If you have questions about this medicine, talk to your doctor, pharmacist, or health care provider.      2016, [employer] Standard. (2011-08-11 12:37:47)

## 2015-10-30 NOTE — Progress Notes (Signed)
Hematology and Oncology Follow Up Visit  Cathy Lewis [id number] 1945/09/28 70 y.o. 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 She is feeling a little better. The pain in her back and shoulder has improved. Her bone scan and CT of the left shoulder showed stable metastatic disease with no new lesions. CT of the abdomen and pelvis showed slight progression of pulmonary mets and new right and enlarging left adrenal mets. This is not surprising given the fact that she had not taken her Cabometyx until last week. She is now taking 40 mg every other day and so far has not had any issues. We will repeat scans in 2 months to evaluate her response.  She had 85 ml of fluid removed from the left knee and has finished her Keflex prescription. The fluid was sent for culture and she follows up with Dr. Polo Riley office tomorrow for results and reevaluation.  No fever, chills, n/v, cough, rash, dizziness, SOB, chest pain, palpitations or changes in bowel or bladder habits.  No other swelling, tenderness, numbness or tingling in her extremities.  No lymphadenopathy found on exam. No episodes of bleeding or bruising.  She has maintained a good appetite and is staying well hydrated. Her weight is stable.   Medications:    Medication List       Accurate as of 10/30/15 12:12 PM. Always use your most recent med list.          acetaminophen 500 MG tablet Commonly known as:  TYLENOL Take 1,000 mg by mouth daily as needed for mild pain or fever. Reported on 07/24/2015   ADVAIR DISKUS 100-50 MCG/DOSE Aepb Generic drug:  Fluticasone-Salmeterol Inhale 1 puff into the lungs 2 (two) times daily before a meal.   albuterol 90 MCG/ACT inhaler Commonly known as:   PROVENTIL,VENTOLIN Inhale 2 puffs into the lungs every 4 (four) hours as needed for wheezing.   amLODipine 5 MG tablet Commonly known as:  NORVASC Take 5 mg by mouth daily.   cabozantinib S-Malate 40 MG Tabs Commonly known as:  CABOMETYX Take 40 mg by mouth daily.   CALCIUM + D PO Take 1 tablet by mouth daily.   cephALEXin 500 MG capsule Commonly known as:  KEFLEX Take 1 capsule (500 mg total) by mouth 2 (two) times daily.   dexamethasone 0.5 MG/5ML solution Commonly known as:  DECADRON Take 10 mLs (1 mg total) by mouth QID.   dicyclomine 20 MG tablet Commonly known as:  BENTYL Take 20 mg by mouth 3 (three) times daily as needed (Stomach Cramps).   fentaNYL 12 MCG/HR Commonly known as:  DURAGESIC - dosed mcg/hr Place 2 patches (25 mcg total) onto the skin every 3 (three) days.   HYDROcodone-acetaminophen 5-325 MG tablet Commonly known as:  NORCO/VICODIN Take 1-2 tablets by mouth every 4 (four) hours as needed for moderate pain or severe pain.   Magnesium 250 MG Tabs Take 250 mg by mouth daily.   meclizine 25 MG tablet Commonly known as:  ANTIVERT Take 12.5 mg by mouth 3 (three) times daily as needed. For dizziness.   methocarbamol 500 MG tablet Commonly known as:  ROBAXIN Take 1 tablet (500 mg total) by mouth every 6 (six) hours as needed for muscle  spasms.   metoCLOPramide 10 MG tablet Commonly known as:  REGLAN Take 1 tablet (10 mg total) by mouth every 6 (six) hours as needed for nausea.   montelukast 10 MG tablet Commonly known as:  SINGULAIR Take 10 mg by mouth at bedtime.   nystatin 100000 UNIT/ML suspension Commonly known as:  MYCOSTATIN Take 5 mLs (500,000 Units total) by mouth 4 (four) times daily.   OSTEO BI-FLEX ADV DOUBLE ST PO Take 1 tablet by mouth daily.   pilocarpine 5 MG tablet Commonly known as:  SALAGEN Take 1 tablet (5 mg total) by mouth 2 (two) times daily.   PROBIOTIC DAILY PO Take 1 tablet by mouth daily.   ranitidine 150 MG  tablet Commonly known as:  ZANTAC Take 150 mg by mouth 2 (two) times daily.   sulfamethoxazole-trimethoprim 800-160 MG tablet Commonly known as:  BACTRIM DS,SEPTRA DS Take 1 tablet by mouth 2 (two) times daily.   traMADol 50 MG tablet Commonly known as:  ULTRAM Take 1 tablet (50 mg total) by mouth every 12 (twelve) hours as needed.   vitamin C 500 MG tablet Commonly known as:  ASCORBIC ACID Take 500 mg by mouth daily.   XGEVA 120 MG/1.7ML Soln injection Generic drug:  denosumab Inject 120 mg into the skin every 30 (thirty) days.       Allergies:  Allergies  Allergen Reactions  . Azithromycin Other (See Comments)    Stomach cramps.  Green black stool   . Ciprofloxacin Other (See Comments)    Body aches Chills Heart races  . Etodolac Palpitations and Other (See Comments)    Joints hurt   . Gnp [Acetaminophen] Other (See Comments)    "GRN" (cough syrup) Heart races  . Moxifloxacin Palpitations and Other (See Comments)    Body ache  . Tramadol Nausea And Vomiting and Other (See Comments)    Dizziness  . Rofecoxib Other (See Comments)    Pt unsure     Past Medical History, Surgical history, Social history, and Family History were reviewed and updated.  Review of Systems: All other 10 point review of systems is negative.   Physical Exam:  height is 5\' 4"  (1.626 m) and weight is 118 lb (53.5 kg). Her oral temperature is 98.3 F (36.8 C). Her blood pressure is 107/57 (abnormal) and her pulse is 83. Her respiration is 18.   Wt Readings from Last 3 Encounters:  10/30/15 118 lb (53.5 kg)  10/22/15 121 lb (54.9 kg)  10/17/15 121 lb (54.9 kg)    Ocular: Sclerae unicteric, pupils equal, round and reactive to light Ear-nose-throat: Oropharynx clear, dentition fair Lymphatic: No cervical supraclavicular or axillary adenopathy Lungs no rales or rhonchi, good excursion bilaterally Heart regular rate and rhythm, no murmur appreciated Abd soft, nontender, positive bowel  sounds, no liver or spleen tip palpated on exam  MSK Chronic back pain and tenderness due to metastatic disease, no joint edema Neuro: non-focal, well-oriented, appropriate affect Breasts: Deferred  Lab Results  Component Value Date   WBC 6.5 10/30/2015   HGB 11.3 (L) 10/30/2015   HCT 35.7 10/30/2015   MCV 90 10/30/2015   PLT 310 10/30/2015   No results found for: FERRITIN, IRON, TIBC, UIBC, IRONPCTSAT Lab Results  Component Value Date   RBC 3.95 10/30/2015   No results found for: KPAFRELGTCHN, LAMBDASER, KAPLAMBRATIO No results found for: IGGSERUM, IGA, IGMSERUM No results found for: TOTALPROTELP, ALBUMINELP, A1GS, A2GS, BETS, BETA2SER, GAMS, [place], SPEI   Chemistry  Component Value Date/Time   NA 138 10/30/2015 1048   NA 141 05/29/2015 0850   K 4.7 10/30/2015 1048   K 3.5 05/29/2015 0850   CL 101 10/30/2015 1048   CO2 29 10/30/2015 1048   CO2 29 05/29/2015 0850   BUN 27 (H) 10/30/2015 1048   BUN 26.7 (H) 05/29/2015 0850   CREATININE 1.4 (H) 10/30/2015 1048   CREATININE 1.8 (H) 05/29/2015 0850   GLU 104 07/20/2008      Component Value Date/Time   CALCIUM 9.2 10/30/2015 1048   CALCIUM 10.6 (H) 05/29/2015 0850   ALKPHOS 83 10/30/2015 1048   ALKPHOS 80 05/29/2015 0850   AST 25 10/30/2015 1048   AST 24 05/29/2015 0850   ALT 25 10/30/2015 1048   ALT 18 05/29/2015 0850   BILITOT 0.60 10/30/2015 1048   BILITOT 0.76 05/29/2015 0850     Impression and Plan: Ms. Friloux is a very pleasant 70 yo white female with metastatic clear cell renal carcinoma diagnosed in 2013. She had not been taking the Cabometyx. Scans showed stable skeletal disease with slight progression of pulmonary mets and new right and enlarging left adrenal mets. She restarted the Cabometyx last week at 40 mg PO every other day and will continue this same dose.  We will plan to repeat scans in 2 months.  She received Delton See today as planned and will get back on her once a month injection schedule.  We  will plan to see her back in 1 month for repeat labs and follow-up.  She will contact our office with any questions or concerns. We can certainly see her sooner if need be.   Eliezer Bottom, NP 9/5/201712:12 PM

## 2015-10-31 ENCOUNTER — Other Ambulatory Visit: Payer: Self-pay | Admitting: *Deleted

## 2015-10-31 DIAGNOSIS — C7951 Secondary malignant neoplasm of bone: Principal | ICD-10-CM

## 2015-10-31 DIAGNOSIS — C649 Malignant neoplasm of unspecified kidney, except renal pelvis: Secondary | ICD-10-CM

## 2015-10-31 MED ORDER — CABOZANTINIB S-MALATE 40 MG PO TABS: 40.0000 mg | ORAL_TABLET | Freq: Every day | ORAL | 3 refills | Status: DC

## 2015-11-15 MED FILL — *CABOMETYX 40 MG TABLET: 40 | 30 days supply | Qty: 30 | Fill #2

## 2015-11-27 ENCOUNTER — Encounter: Payer: Self-pay | Admitting: Interventional Radiology

## 2015-11-29 ENCOUNTER — Ambulatory Visit (HOSPITAL_BASED_OUTPATIENT_CLINIC_OR_DEPARTMENT_OTHER): Payer: Medicare PPO

## 2015-11-29 ENCOUNTER — Encounter: Payer: Self-pay | Admitting: Family

## 2015-11-29 ENCOUNTER — Other Ambulatory Visit (HOSPITAL_BASED_OUTPATIENT_CLINIC_OR_DEPARTMENT_OTHER): Payer: Medicare PPO

## 2015-11-29 ENCOUNTER — Other Ambulatory Visit: Payer: Self-pay | Admitting: Family

## 2015-11-29 ENCOUNTER — Ambulatory Visit (HOSPITAL_BASED_OUTPATIENT_CLINIC_OR_DEPARTMENT_OTHER): Payer: Medicare PPO | Admitting: Family

## 2015-11-29 VITALS — BP 135/84 | HR 92 | Temp 98.1°F | Resp 18 | Ht 64.0 in | Wt 122.0 lb

## 2015-11-29 DIAGNOSIS — E860 Dehydration: Secondary | ICD-10-CM

## 2015-11-29 DIAGNOSIS — C649 Malignant neoplasm of unspecified kidney, except renal pelvis: Secondary | ICD-10-CM

## 2015-11-29 DIAGNOSIS — C7951 Secondary malignant neoplasm of bone: Secondary | ICD-10-CM

## 2015-11-29 DIAGNOSIS — B370 Candidal stomatitis: Secondary | ICD-10-CM

## 2015-11-29 DIAGNOSIS — R682 Dry mouth, unspecified: Secondary | ICD-10-CM

## 2015-11-29 DIAGNOSIS — K121 Other forms of stomatitis: Secondary | ICD-10-CM

## 2015-11-29 LAB — CBC WITH DIFFERENTIAL (CANCER CENTER ONLY)
BASO#: 0 10*3/uL (ref 0.0–0.2)
BASO%: 0.5 % (ref 0.0–2.0)
EOS%: 1 % (ref 0.0–7.0)
Eosinophils Absolute: 0.1 10*3/uL (ref 0.0–0.5)
HEMATOCRIT: 35.6 % (ref 34.8–46.6)
HGB: 11.6 g/dL (ref 11.6–15.9)
LYMPH#: 1.6 10*3/uL (ref 0.9–3.3)
LYMPH%: 27.7 % (ref 14.0–48.0)
MCH: 28.9 pg (ref 26.0–34.0)
MCHC: 32.6 g/dL (ref 32.0–36.0)
MCV: 89 fL (ref 81–101)
MONO#: 0.4 10*3/uL (ref 0.1–0.9)
MONO%: 6.7 % (ref 0.0–13.0)
NEUT#: 3.7 10*3/uL (ref 1.5–6.5)
NEUT%: 64.1 % (ref 39.6–80.0)
PLATELETS: 299 10*3/uL (ref 145–400)
RBC: 4.02 10*6/uL (ref 3.70–5.32)
RDW: 21.2 % — AB (ref 11.1–15.7)
WBC: 5.8 10*3/uL (ref 3.9–10.0)

## 2015-11-29 LAB — LACTATE DEHYDROGENASE: LDH: 317 U/L — ABNORMAL HIGH (ref 125–245)

## 2015-11-29 LAB — CMP (CANCER CENTER ONLY)
ALK PHOS: 80 U/L (ref 26–84)
ALT: 29 U/L (ref 10–47)
AST: 31 U/L (ref 11–38)
Albumin: 3.3 g/dL (ref 3.3–5.5)
BUN, Bld: 13 mg/dL (ref 7–22)
CALCIUM: 9.4 mg/dL (ref 8.0–10.3)
CHLORIDE: 102 meq/L (ref 98–108)
CO2: 27 meq/L (ref 18–33)
Creat: 1.1 mg/dl (ref 0.6–1.2)
GLUCOSE: 91 mg/dL (ref 73–118)
POTASSIUM: 4.7 meq/L (ref 3.3–4.7)
Sodium: 135 mEq/L (ref 128–145)
Total Bilirubin: 0.5 mg/dl (ref 0.20–1.60)
Total Protein: 7 g/dL (ref 6.4–8.1)

## 2015-11-29 MED ORDER — DENOSUMAB 120 MG/1.7ML ~~LOC~~ SOLN
120.0000 mg | Freq: Once | SUBCUTANEOUS | Status: AC
  Administered 2015-11-29: 120 mg via SUBCUTANEOUS
  Filled 2015-11-29: qty 1.7

## 2015-11-29 NOTE — Patient Instructions (Signed)
Denosumab injection  What is this medicine?  DENOSUMAB (den oh sue mab) slows bone breakdown. Prolia is used to treat osteoporosis in women after menopause and in men. Xgeva is used to prevent bone fractures and other bone problems caused by cancer bone metastases. Xgeva is also used to treat giant cell tumor of the bone.  This medicine may be used for other purposes; ask your health care provider or pharmacist if you have questions.  What should I tell my health care provider before I take this medicine?  They need to know if you have any of these conditions:  -dental disease  -eczema  -infection or history of infections  -kidney disease or on dialysis  -low blood calcium or vitamin D  -malabsorption syndrome  -scheduled to have surgery or tooth extraction  -taking medicine that contains denosumab  -thyroid or parathyroid disease  -an unusual reaction to denosumab, other medicines, foods, dyes, or preservatives  -pregnant or trying to get pregnant  -breast-feeding  How should I use this medicine?  This medicine is for injection under the skin. It is given by a health care professional in a hospital or clinic setting.  If you are getting Prolia, a special MedGuide will be given to you by the pharmacist with each prescription and refill. Be sure to read this information carefully each time.  For Prolia, talk to your pediatrician regarding the use of this medicine in children. Special care may be needed. For Xgeva, talk to your pediatrician regarding the use of this medicine in children. While this drug may be prescribed for children as young as 13 years for selected conditions, precautions do apply.  Overdosage: If you think you have taken too much of this medicine contact a poison control center or emergency room at once.  NOTE: This medicine is only for you. Do not share this medicine with others.  What if I miss a dose?  It is important not to miss your dose. Call your doctor or health care professional if you are  unable to keep an appointment.  What may interact with this medicine?  Do not take this medicine with any of the following medications:  -other medicines containing denosumab  This medicine may also interact with the following medications:  -medicines that suppress the immune system  -medicines that treat cancer  -steroid medicines like prednisone or cortisone  This list may not describe all possible interactions. Give your health care provider a list of all the medicines, herbs, non-prescription drugs, or dietary supplements you use. Also tell them if you smoke, drink alcohol, or use illegal drugs. Some items may interact with your medicine.  What should I watch for while using this medicine?  Visit your doctor or health care professional for regular checks on your progress. Your doctor or health care professional may order blood tests and other tests to see how you are doing.  Call your doctor or health care professional if you get a cold or other infection while receiving this medicine. Do not treat yourself. This medicine may decrease your body's ability to fight infection.  You should make sure you get enough calcium and vitamin D while you are taking this medicine, unless your doctor tells you not to. Discuss the foods you eat and the vitamins you take with your health care professional.  See your dentist regularly. Brush and floss your teeth as directed. Before you have any dental work done, tell your dentist you are receiving this medicine.  Do   not become pregnant while taking this medicine or for 5 months after stopping it. Women should inform their doctor if they wish to become pregnant or think they might be pregnant. There is a potential for serious side effects to an unborn child. Talk to your health care professional or pharmacist for more information.  What side effects may I notice from receiving this medicine?  Side effects that you should report to your doctor or health care professional as soon as  possible:  -allergic reactions like skin rash, itching or hives, swelling of the face, lips, or tongue  -breathing problems  -chest pain  -fast, irregular heartbeat  -feeling faint or lightheaded, falls  -fever, chills, or any other sign of infection  -muscle spasms, tightening, or twitches  -numbness or tingling  -skin blisters or bumps, or is dry, peels, or red  -slow healing or unexplained pain in the mouth or jaw  -unusual bleeding or bruising  Side effects that usually do not require medical attention (Report these to your doctor or health care professional if they continue or are bothersome.):  -muscle pain  -stomach upset, gas  This list may not describe all possible side effects. Call your doctor for medical advice about side effects. You may report side effects to FDA at 1-800-FDA-1088.  Where should I keep my medicine?  This medicine is only given in a clinic, doctor's office, or other health care setting and will not be stored at home.  NOTE: This sheet is a summary. It may not cover all possible information. If you have questions about this medicine, talk to your doctor, pharmacist, or health care provider.      2016, [employer] Standard. (2011-08-11 12:37:47)

## 2015-11-29 NOTE — Progress Notes (Signed)
Hematology and Oncology Follow Up Visit  Cathy Lewis [id number] [birth year] 70 y.o. 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 She is doing well on the Cabometyx every other day and has not had any issue with side effects. We will have her continue on this same dose.  She has some should discomfort that comes and goes. The fentanyl patch has really helped with this.  She has had her left knee drained twice. It is now filled with fluid again and Dr. Novella Olive feels that it is time to get an MRI and then surgically clean out her knee. She is waiting to hear from his office to schedule the MRI.  No fever, n/v, cough, rash, dizziness, SOB, chest pain, palpitations or changes in bowel or bladder habits. She has occasional constipation and takes milk of magnesia as needed. She has also had a few episodes of chills.  No other swelling or tenderness and no numbness or tingling in her extremities.  No lymphadenopathy found on exam. No episodes of bleeding or bruising.  She has maintained a good appetite and is staying well hydrated. Her weight is stable.   Medications:    Medication List       Accurate as of 11/29/15 11:26 AM. Always use your most recent med list.          acetaminophen 500 MG tablet Commonly known as:  TYLENOL Take 1,000 mg by mouth daily as needed for mild pain or fever. Reported on 07/24/2015   ADVAIR DISKUS 100-50 MCG/DOSE Aepb Generic drug:  Fluticasone-Salmeterol Inhale 1 puff into the lungs 2 (two) times daily before a meal.   albuterol 90 MCG/ACT inhaler Commonly known as:  PROVENTIL,VENTOLIN Inhale 2 puffs into the lungs every 4 (four) hours as needed for wheezing.   amLODipine 5 MG tablet Commonly known as:  NORVASC Take 5 mg by  mouth daily.   cabozantinib S-Malate 40 MG Tabs Commonly known as:  CABOMETYX Take 40 mg by mouth daily.   CALCIUM + D PO Take 1 tablet by mouth daily.   cephALEXin 500 MG capsule Commonly known as:  KEFLEX Take 1 capsule (500 mg total) by mouth 2 (two) times daily.   dexamethasone 0.5 MG/5ML solution Commonly known as:  DECADRON Take 10 mLs (1 mg total) by mouth QID.   dicyclomine 20 MG tablet Commonly known as:  BENTYL Take 20 mg by mouth 3 (three) times daily as needed (Stomach Cramps).   fentaNYL 12 MCG/HR Commonly known as:  DURAGESIC - dosed mcg/hr Place 2 patches (25 mcg total) onto the skin every 3 (three) days.   HYDROcodone-acetaminophen 5-325 MG tablet Commonly known as:  NORCO/VICODIN Take 1-2 tablets by mouth every 4 (four) hours as needed for moderate pain or severe pain.   Magnesium 250 MG Tabs Take 250 mg by mouth daily.   meclizine 25 MG tablet Commonly known as:  ANTIVERT Take 12.5 mg by mouth 3 (three) times daily as needed. For dizziness.   methocarbamol 500 MG tablet Commonly known as:  ROBAXIN Take 1 tablet (500 mg total) by mouth every 6 (six) hours as needed for muscle spasms.   metoCLOPramide 10 MG tablet Commonly known as:  REGLAN Take 1 tablet (10 mg total) by  mouth every 6 (six) hours as needed for nausea.   montelukast 10 MG tablet Commonly known as:  SINGULAIR Take 10 mg by mouth at bedtime.   nystatin 100000 UNIT/ML suspension Commonly known as:  MYCOSTATIN Take 5 mLs (500,000 Units total) by mouth 4 (four) times daily.   OSTEO BI-FLEX ADV DOUBLE ST PO Take 1 tablet by mouth daily.   pilocarpine 5 MG tablet Commonly known as:  SALAGEN Take 1 tablet (5 mg total) by mouth 2 (two) times daily.   PROBIOTIC DAILY PO Take 1 tablet by mouth daily.   ranitidine 150 MG tablet Commonly known as:  ZANTAC Take 150 mg by mouth 2 (two) times daily.   traMADol 50 MG tablet Commonly known as:  ULTRAM Take 1 tablet (50 mg total) by  mouth every 12 (twelve) hours as needed.   vitamin C 500 MG tablet Commonly known as:  ASCORBIC ACID Take 500 mg by mouth daily.   XGEVA 120 MG/1.7ML Soln injection Generic drug:  denosumab Inject 120 mg into the skin every 30 (thirty) days.       Allergies:  Allergies  Allergen Reactions  . Azithromycin Other (See Comments)    Stomach cramps.  Green black stool   . Ciprofloxacin Other (See Comments)    Body aches Chills Heart races  . Etodolac Palpitations and Other (See Comments)    Joints hurt   . Gnp [Acetaminophen] Other (See Comments)    "GRN" (cough syrup) Heart races  . Moxifloxacin Palpitations and Other (See Comments)    Body ache  . Tramadol Nausea And Vomiting and Other (See Comments)    Dizziness  . Rofecoxib Other (See Comments)    Pt unsure     Past Medical History, Surgical history, Social history, and Family History were reviewed and updated.  Review of Systems: All other 10 point review of systems is negative.   Physical Exam:  height is 5\' 4"  (1.626 m) and weight is 122 lb (55.3 kg). Her oral temperature is 98.1 F (36.7 C). Her blood pressure is 135/84 and her pulse is 92. Her respiration is 18.   Wt Readings from Last 3 Encounters:  11/29/15 122 lb (55.3 kg)  10/30/15 118 lb (53.5 kg)  10/22/15 121 lb (54.9 kg)    Ocular: Sclerae unicteric, pupils equal, round and reactive to light Ear-nose-throat: Oropharynx clear, dentition fair Lymphatic: No cervical supraclavicular or axillary adenopathy Lungs no rales or rhonchi, good excursion bilaterally Heart regular rate and rhythm, no murmur appreciated Abd soft, nontender, positive bowel sounds, no liver or spleen tip palpated on exam  MSK Chronic back pain and tenderness due to metastatic disease, no joint edema Neuro: non-focal, well-oriented, appropriate affect Breasts: Deferred  Lab Results  Component Value Date   WBC 5.8 11/29/2015   HGB 11.6 11/29/2015   HCT 35.6 11/29/2015   MCV  89 11/29/2015   PLT 299 11/29/2015   No results found for: FERRITIN, IRON, TIBC, UIBC, IRONPCTSAT Lab Results  Component Value Date   RBC 4.02 11/29/2015   No results found for: KPAFRELGTCHN, LAMBDASER, KAPLAMBRATIO No results found for: IGGSERUM, IGA, IGMSERUM No results found for: Ronnald Ramp, A1GS, A2GS, Violet Baldy, MSPIKE, SPEI   Chemistry      Component Value Date/Time   NA 138 10/30/2015 1048   NA 141 05/29/2015 0850   K 4.7 10/30/2015 1048   K 3.5 05/29/2015 0850   CL 101 10/30/2015 1048   CO2 29 10/30/2015 1048   CO2  29 05/29/2015 0850   BUN 27 (H) 10/30/2015 1048   BUN 26.7 (H) 05/29/2015 0850   CREATININE 1.4 (H) 10/30/2015 1048   CREATININE 1.8 (H) 05/29/2015 0850   GLU 104 07/20/2008      Component Value Date/Time   CALCIUM 9.2 10/30/2015 1048   CALCIUM 10.6 (H) 05/29/2015 0850   ALKPHOS 83 10/30/2015 1048   ALKPHOS 80 05/29/2015 0850   AST 25 10/30/2015 1048   AST 24 05/29/2015 0850   ALT 25 10/30/2015 1048   ALT 18 05/29/2015 0850   BILITOT 0.60 10/30/2015 1048   BILITOT 0.76 05/29/2015 0850     Impression and Plan: Ms. Berliner is a very pleasant 70 yo white female with metastatic clear cell renal carcinoma diagnosed in 2013. She is doing well on Cabometyx every other day and has no issue so far with side effects. She will continue on this same dose.  She is waiting to schedule an MRI of the left knee so that DR. Dahldorf will be able to clean out the fluid pocket.  We will plan to repeat scans before her next follow-up and have her come back in 1 month for repeat lab work and Dr. follow-up to discuss results.  She received Delton See today as planned.  We will plan to see her back in 1 month for repeat labs and follow-up.  She will contact our office with any questions or concerns. We can certainly see her sooner if need be.   Eliezer Bottom, NP 10/5/201711:26 AM

## 2015-12-04 ENCOUNTER — Other Ambulatory Visit: Payer: Self-pay | Admitting: Orthopaedic Surgery

## 2015-12-04 DIAGNOSIS — M25562 Pain in left knee: Secondary | ICD-10-CM

## 2015-12-09 ENCOUNTER — Ambulatory Visit
Admission: RE | Admit: 2015-12-09 | Discharge: 2015-12-09 | Disposition: A | Discharge status: Home / Self Care | Payer: Medicare PPO | Source: Ambulatory Visit | Attending: Orthopaedic Surgery | Admitting: Orthopaedic Surgery

## 2015-12-09 DIAGNOSIS — M25562 Pain in left knee: Secondary | ICD-10-CM

## 2015-12-19 ENCOUNTER — Encounter: Payer: Self-pay | Admitting: *Deleted

## 2015-12-19 ENCOUNTER — Encounter: Payer: Self-pay | Admitting: Hematology & Oncology

## 2015-12-25 ENCOUNTER — Telehealth: Payer: Self-pay | Admitting: *Deleted

## 2015-12-25 NOTE — Telephone Encounter (Addendum)
Patient is aware of instructions. She will resume medications.   ----- Message from Eliezer Bottom, NP sent at 12/24/2015  2:22 PM EDT ----- Regarding: Cabometyx It is ok for Ms. Cathy Lewis to resume taking her Cabometyx per [name]. Thank you!  Judson Roch

## 2015-12-27 ENCOUNTER — Other Ambulatory Visit: Payer: Medicare PPO

## 2015-12-27 ENCOUNTER — Encounter: Payer: Self-pay | Admitting: Hematology & Oncology

## 2015-12-27 ENCOUNTER — Ambulatory Visit: Payer: Medicare PPO | Admitting: Family

## 2015-12-28 ENCOUNTER — Other Ambulatory Visit (HOSPITAL_BASED_OUTPATIENT_CLINIC_OR_DEPARTMENT_OTHER): Payer: Medicare PPO

## 2015-12-28 ENCOUNTER — Ambulatory Visit (HOSPITAL_BASED_OUTPATIENT_CLINIC_OR_DEPARTMENT_OTHER): Payer: Medicare PPO | Admitting: Family

## 2015-12-28 ENCOUNTER — Ambulatory Visit (HOSPITAL_BASED_OUTPATIENT_CLINIC_OR_DEPARTMENT_OTHER): Payer: Medicare PPO

## 2015-12-28 VITALS — BP 127/71 | HR 84 | Temp 98.3°F | Resp 20 | Wt 117.8 lb

## 2015-12-28 DIAGNOSIS — C7951 Secondary malignant neoplasm of bone: Secondary | ICD-10-CM

## 2015-12-28 DIAGNOSIS — C649 Malignant neoplasm of unspecified kidney, except renal pelvis: Secondary | ICD-10-CM | POA: Diagnosis not present

## 2015-12-28 DIAGNOSIS — R911 Solitary pulmonary nodule: Secondary | ICD-10-CM | POA: Diagnosis not present

## 2015-12-28 DIAGNOSIS — R630 Anorexia: Secondary | ICD-10-CM

## 2015-12-28 DIAGNOSIS — Z23 Encounter for immunization: Secondary | ICD-10-CM

## 2015-12-28 DIAGNOSIS — E279 Disorder of adrenal gland, unspecified: Secondary | ICD-10-CM

## 2015-12-28 DIAGNOSIS — R634 Abnormal weight loss: Secondary | ICD-10-CM

## 2015-12-28 LAB — CBC WITH DIFFERENTIAL (CANCER CENTER ONLY)
BASO#: 0 10*3/uL (ref 0.0–0.2)
BASO%: 0.4 % (ref 0.0–2.0)
EOS ABS: 0 10*3/uL (ref 0.0–0.5)
EOS%: 0.9 % (ref 0.0–7.0)
HCT: 35.6 % (ref 34.8–46.6)
HGB: 11.4 g/dL — ABNORMAL LOW (ref 11.6–15.9)
LYMPH#: 1.6 10*3/uL (ref 0.9–3.3)
LYMPH%: 35.3 % (ref 14.0–48.0)
MCH: 29.6 pg (ref 26.0–34.0)
MCHC: 32 g/dL (ref 32.0–36.0)
MCV: 93 fL (ref 81–101)
MONO#: 0.3 10*3/uL (ref 0.1–0.9)
MONO%: 6.8 % (ref 0.0–13.0)
NEUT#: 2.6 10*3/uL (ref 1.5–6.5)
NEUT%: 56.6 % (ref 39.6–80.0)
PLATELETS: 346 10*3/uL (ref 145–400)
RBC: 3.85 10*6/uL (ref 3.70–5.32)
RDW: 21.4 % — AB (ref 11.1–15.7)
WBC: 4.5 10*3/uL (ref 3.9–10.0)

## 2015-12-28 LAB — COMPREHENSIVE METABOLIC PANEL
ALT: 29 U/L (ref 0–55)
ANION GAP: 9 meq/L (ref 3–11)
AST: 28 U/L (ref 5–34)
Albumin: 3.4 g/dL — ABNORMAL LOW (ref 3.5–5.0)
Alkaline Phosphatase: 78 U/L (ref 40–150)
BUN: 21.9 mg/dL (ref 7.0–26.0)
CALCIUM: 9.4 mg/dL (ref 8.4–10.4)
CHLORIDE: 104 meq/L (ref 98–109)
CO2: 27 meq/L (ref 22–29)
Creatinine: 1.2 mg/dL — ABNORMAL HIGH (ref 0.6–1.1)
EGFR: 45 mL/min/{1.73_m2} — AB (ref >90)
Glucose: 101 mg/dl (ref 70–140)
POTASSIUM: 4.4 meq/L (ref 3.5–5.1)
Sodium: 140 mEq/L (ref 136–145)
Total Bilirubin: 0.3 mg/dL (ref 0.20–1.20)
Total Protein: 7 g/dL (ref 6.4–8.3)

## 2015-12-28 LAB — LACTATE DEHYDROGENASE: LDH: 237 U/L (ref 125–245)

## 2015-12-28 MED ORDER — INFLUENZA VAC SPLIT QUAD 0.5 ML IM SUSY
0.5000 mL | PREFILLED_SYRINGE | Freq: Once | INTRAMUSCULAR | Status: AC
  Administered 2015-12-28: 0.5 mL via INTRAMUSCULAR
  Filled 2015-12-28: qty 0.5

## 2015-12-28 MED ORDER — DENOSUMAB 120 MG/1.7ML ~~LOC~~ SOLN
120.0000 mg | Freq: Once | SUBCUTANEOUS | Status: AC
  Administered 2015-12-28: 120 mg via SUBCUTANEOUS
  Filled 2015-12-28: qty 1.7

## 2015-12-28 MED ORDER — PREDNISONE 10 MG PO TABS: 10.0000 mg | ORAL_TABLET | Freq: Every day | ORAL | 3 refills | Status: DC

## 2015-12-28 NOTE — Patient Instructions (Addendum)
Denosumab injection What is this medicine? DENOSUMAB (den oh sue mab) slows bone breakdown. Prolia is used to treat osteoporosis in women after menopause and in men. Xgeva is used to prevent bone fractures and other bone problems caused by cancer bone metastases. Xgeva is also used to treat giant cell tumor of the bone. This medicine may be used for other purposes; ask your health care provider or pharmacist if you have questions. What should I tell my health care provider before I take this medicine? They need to know if you have any of these conditions: -dental disease -eczema -infection or history of infections -kidney disease or on dialysis -low blood calcium or vitamin D -malabsorption syndrome -scheduled to have surgery or tooth extraction -taking medicine that contains denosumab -thyroid or parathyroid disease -an unusual reaction to denosumab, other medicines, foods, dyes, or preservatives -pregnant or trying to get pregnant -breast-feeding How should I use this medicine? This medicine is for injection under the skin. It is given by a health care professional in a hospital or clinic setting. If you are getting Prolia, a special MedGuide will be given to you by the pharmacist with each prescription and refill. Be sure to read this information carefully each time. For Prolia, talk to your pediatrician regarding the use of this medicine in children. Special care may be needed. For Xgeva, talk to your pediatrician regarding the use of this medicine in children. While this drug may be prescribed for children as young as 13 years for selected conditions, precautions do apply. Overdosage: If you think you have taken too much of this medicine contact a poison control center or emergency room at once. NOTE: This medicine is only for you. Do not share this medicine with others. What if I miss a dose? It is important not to miss your dose. Call your doctor or health care professional if you are  unable to keep an appointment. What may interact with this medicine? Do not take this medicine with any of the following medications: -other medicines containing denosumab This medicine may also interact with the following medications: -medicines that suppress the immune system -medicines that treat cancer -steroid medicines like prednisone or cortisone This list may not describe all possible interactions. Give your health care provider a list of all the medicines, herbs, non-prescription drugs, or dietary supplements you use. Also tell them if you smoke, drink alcohol, or use illegal drugs. Some items may interact with your medicine. What should I watch for while using this medicine? Visit your doctor or health care professional for regular checks on your progress. Your doctor or health care professional may order blood tests and other tests to see how you are doing. Call your doctor or health care professional if you get a cold or other infection while receiving this medicine. Do not treat yourself. This medicine may decrease your body's ability to fight infection. You should make sure you get enough calcium and vitamin D while you are taking this medicine, unless your doctor tells you not to. Discuss the foods you eat and the vitamins you take with your health care professional. See your dentist regularly. Brush and floss your teeth as directed. Before you have any dental work done, tell your dentist you are receiving this medicine. Do not become pregnant while taking this medicine or for 5 months after stopping it. Women should inform their doctor if they wish to become pregnant or think they might be pregnant. There is a potential for serious side effects   to an unborn child. Talk to your health care professional or pharmacist for more information. What side effects may I notice from receiving this medicine? Side effects that you should report to your doctor or health care professional as soon as  possible: -allergic reactions like skin rash, itching or hives, swelling of the face, lips, or tongue -breathing problems -chest pain -fast, irregular heartbeat -feeling faint or lightheaded, falls -fever, chills, or any other sign of infection -muscle spasms, tightening, or twitches -numbness or tingling -skin blisters or bumps, or is dry, peels, or red -slow healing or unexplained pain in the mouth or jaw -unusual bleeding or bruising Side effects that usually do not require medical attention (Report these to your doctor or health care professional if they continue or are bothersome.): -muscle pain -stomach upset, gas This list may not describe all possible side effects. Call your doctor for medical advice about side effects. You may report side effects to FDA at 1-800-FDA-1088. Where should I keep my medicine? This medicine is only given in a clinic, doctor's office, or other health care setting and will not be stored at home. NOTE: This sheet is a summary. It may not cover all possible information. If you have questions about this medicine, talk to your doctor, pharmacist, or health care provider.    2016, Elsevier/Gold Standard. (2011-08-11 12:37:47) Influenza Virus Vaccine (Flucelvax) What is this medicine? INFLUENZA VIRUS VACCINE (in floo EN zuh VAHY ruhs vak SEEN) helps to reduce the risk of getting influenza also known as the flu. The vaccine only helps protect you against some strains of the flu. This medicine may be used for other purposes; ask your health care provider or pharmacist if you have questions. What should I tell my health care provider before I take this medicine? They need to know if you have any of these conditions: -bleeding disorder like hemophilia -fever or infection -Guillain-Barre syndrome or other neurological problems -immune system problems -infection with the human immunodeficiency virus (HIV) or AIDS -low blood platelet counts -multiple  sclerosis -an unusual or allergic reaction to influenza virus vaccine, other medicines, foods, dyes or preservatives -pregnant or trying to get pregnant -breast-feeding How should I use this medicine? This vaccine is for injection into a muscle. It is given by a health care professional. A copy of Vaccine Information Statements will be given before each vaccination. Read this sheet carefully each time. The sheet may change frequently. Talk to your pediatrician regarding the use of this medicine in children. Special care may be needed. Overdosage: If you think you've taken too much of this medicine contact a poison control center or emergency room at once. Overdosage: If you think you have taken too much of this medicine contact a poison control center or emergency room at once. NOTE: This medicine is only for you. Do not share this medicine with others. What if I miss a dose? This does not apply. What may interact with this medicine? -chemotherapy or radiation therapy -medicines that lower your immune system like etanercept, anakinra, infliximab, and adalimumab -medicines that treat or prevent blood clots like warfarin -phenytoin -steroid medicines like prednisone or cortisone -theophylline -vaccines This list may not describe all possible interactions. Give your health care provider a list of all the medicines, herbs, non-prescription drugs, or dietary supplements you use. Also tell them if you smoke, drink alcohol, or use illegal drugs. Some items may interact with your medicine. What should I watch for while using this medicine? Report any side effects   that do not go away within 3 days to your doctor or health care professional. Call your health care provider if any unusual symptoms occur within 6 weeks of receiving this vaccine. You may still catch the flu, but the illness is not usually as bad. You cannot get the flu from the vaccine. The vaccine will not protect against colds or other  illnesses that may cause fever. The vaccine is needed every year. What side effects may I notice from receiving this medicine? Side effects that you should report to your doctor or health care professional as soon as possible: -allergic reactions like skin rash, itching or hives, swelling of the face, lips, or tongue Side effects that usually do not require medical attention (Report these to your doctor or health care professional if they continue or are bothersome.): -fever -headache -muscle aches and pains -pain, tenderness, redness, or swelling at the injection site -tiredness This list may not describe all possible side effects. Call your doctor for medical advice about side effects. You may report side effects to FDA at 1-800-FDA-1088. Where should I keep my medicine? The vaccine will be given by a health care professional in a clinic, pharmacy, doctor's office, or other health care setting. You will not be given vaccine doses to store at home. NOTE: This sheet is a summary. It may not cover all possible information. If you have questions about this medicine, talk to your doctor, pharmacist, or health care provider.    2016, Elsevier/Gold Standard. (2011-01-22 14:06:47)  

## 2015-12-28 NOTE — Progress Notes (Signed)
Hematology and Oncology Follow Up Visit  Cathy Lewis [id number] 1945-10-25 70 y.o. 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 Her CT scans yesterday showed improvement with reduction in size or the nodules in her right upper lobe as well as reduction in size of the left adrenal mass. Bony metastasis appeared stable.  She verbalized that she is taking her Cabometyx as prescribed at 40 mg PO every other day.  He only complaint at this time is no appetite. She would like something to help with this. Her weight is down 5 lbs since her last visit. She is staying hydrated.  No fever, chills, n/v, cough, rash, dizziness, SOB, chest pain, palpitations or changes in bowel or bladder habits. She does have occasional constipation and takes milk of magnesia as needed.  She has some redness and scabbing on both heels that is healing. She had started an antibiotic for this prior to having her left knee cleaned out last week. Her left knee looks much better. She denies pain at this time.  No swelling, tenderness, numbness or tingling in her extremities.  No lymphadenopathy found on exam. No episodes of bleeding or bruising.   Medications:    Medication List       Accurate as of 12/28/15  2:34 PM. Always use your most recent med list.          ADVAIR DISKUS 100-50 MCG/DOSE Aepb Generic drug:  Fluticasone-Salmeterol Inhale 1 puff into the lungs 2 (two) times daily before a meal.   albuterol 90 MCG/ACT inhaler Commonly known as:  PROVENTIL,VENTOLIN Inhale 2 puffs into the lungs every 4 (four) hours as needed for wheezing.     amLODipine 5 MG tablet Commonly known as:  NORVASC Take 5 mg by mouth daily.   cabozantinib S-Malate 40 MG Tabs Commonly known as:  CABOMETYX Take 40 mg by mouth daily.   CALCIUM + D PO Take 1 tablet by mouth daily.   cephALEXin 500 MG capsule Commonly known as:  KEFLEX Take 1 capsule (500 mg total) by mouth 2 (two) times daily.   dexamethasone 0.5 MG/5ML solution Commonly known as:  DECADRON TAKE 10 MLS (1 MG TOTAL) BY MOUTH 4 TIMES A DAY .   dicyclomine 20 MG tablet Commonly known as:  BENTYL Take 20 mg by mouth 3 (three) times daily as needed (Stomach Cramps).   fentaNYL 12 MCG/HR Commonly known as:  DURAGESIC - dosed mcg/hr Place 2 patches (25 mcg total) onto the skin every 3 (three) days.   HYDROcodone-acetaminophen 5-325 MG tablet Commonly known as:  NORCO/VICODIN Take 1-2 tablets by mouth every 4 (four) hours as needed for moderate pain or severe pain.   Magnesium 250 MG Tabs Take 250 mg by mouth daily.   meclizine 25 MG tablet Commonly known as:  ANTIVERT Take 12.5 mg by mouth 3 (three) times daily as needed. For dizziness.   metoCLOPramide 10 MG tablet Commonly known as:  REGLAN Take 1 tablet (10 mg total) by mouth every 6 (six) hours as needed for nausea.   montelukast 10 MG tablet Commonly known as:  SINGULAIR Take 10 mg by mouth at bedtime.   nystatin 100000 UNIT/ML suspension Commonly known as:  MYCOSTATIN Take 5 mLs (500,000 Units total) by mouth 4 (four) times daily.   OSTEO BI-FLEX ADV DOUBLE ST PO Take 1 tablet by mouth daily.   pilocarpine 5 MG tablet Commonly known as:  SALAGEN Take 1 tablet (5 mg total) by mouth 2 (two) times daily.   PROBIOTIC DAILY PO Take 1 tablet by mouth daily.   ranitidine 150 MG tablet Commonly known as:  ZANTAC Take 150 mg by mouth 2 (two) times daily.   vitamin C 500 MG tablet Commonly known as:  ASCORBIC ACID Take 500 mg by mouth daily.   XGEVA 120 MG/1.7ML Soln injection Generic drug:   denosumab Inject 120 mg into the skin every 30 (thirty) days.       Allergies:  Allergies  Allergen Reactions  . Azithromycin Other (See Comments)    Stomach cramps.  Green black stool   . Ciprofloxacin Other (See Comments)    Body aches Chills Heart races  . Etodolac Palpitations and Other (See Comments)    Joints hurt   . Gnp [Acetaminophen] Other (See Comments)    "GRN" (cough syrup) Heart races  . Moxifloxacin Palpitations and Other (See Comments)    Body ache  . Tramadol Nausea And Vomiting and Other (See Comments)    Dizziness  . Rofecoxib Other (See Comments)    Pt unsure     Past Medical History, Surgical history, Social history, and Family History were reviewed and updated.  Review of Systems: All other 10 point review of systems is negative.   Physical Exam:  weight is 117 lb 12.8 oz (53.4 kg). Her oral temperature is 98.3 F (36.8 C). Her blood pressure is 127/71 and her pulse is 84. Her respiration is 20.   Wt Readings from Last 3 Encounters:  12/28/15 117 lb 12.8 oz (53.4 kg)  11/29/15 122 lb (55.3 kg)  10/30/15 118 lb (53.5 kg)    Ocular: Sclerae unicteric, pupils equal, round and reactive to light Ear-nose-throat: Oropharynx clear, dentition fair Lymphatic: No cervical supraclavicular or axillary adenopathy Lungs no rales or rhonchi, good excursion bilaterally Heart regular rate and rhythm, no murmur appreciated Abd soft, nontender, positive bowel sounds, no liver or spleen tip palpated on exam, no fluid wave  MSK Chronic back/right shoulder pain and tenderness due to metastatic disease, no joint edema Neuro: non-focal, well-oriented, appropriate affect Breasts: Deferred  Lab Results  Component Value Date   WBC 4.5 12/28/2015   HGB 11.4 (L) 12/28/2015   HCT 35.6 12/28/2015   MCV 93 12/28/2015   PLT 346 12/28/2015   No results found for: FERRITIN, IRON, TIBC, UIBC, IRONPCTSAT Lab Results  Component Value Date   RBC 3.85 12/28/2015   No  results found for: KPAFRELGTCHN, LAMBDASER, KAPLAMBRATIO No results found for: IGGSERUM, IGA, IGMSERUM No results found for: Odetta Pink, SPEI   Chemistry      Component Value Date/Time   NA 135 11/29/2015 1105   NA 141 05/29/2015 0850   K 4.7 11/29/2015 1105   K 3.5 05/29/2015 0850   CL 102 11/29/2015 1105   CO2 27 11/29/2015 1105   CO2 29 05/29/2015 0850   BUN 13 11/29/2015 1105   BUN 26.7 (H) 05/29/2015 0850   CREATININE 1.1 11/29/2015 1105   CREATININE 1.8 (H) 05/29/2015 0850   GLU 104 07/20/2008      Component Value Date/Time  CALCIUM 9.4 11/29/2015 1105   CALCIUM 10.6 (H) 05/29/2015 0850   ALKPHOS 80 11/29/2015 1105   ALKPHOS 80 05/29/2015 0850   AST 31 11/29/2015 1105   AST 24 05/29/2015 0850   ALT 29 11/29/2015 1105   ALT 18 05/29/2015 0850   BILITOT 0.50 11/29/2015 1105   BILITOT 0.76 05/29/2015 0850     Impression and Plan: Cathy Lewis is a very pleasant 70 yo white female with metastatic clear cell renal carcinoma diagnosed in 2013. She is having a nice response on Cabometyx. Scans yesterday showed improvement with reduction in size of nodules in the right upper lobe and reduction in size or her left adrenal mass. Bony metastasis looked stable.  She has noticed that she has no appetite and is losing weight.  We will have her start Prednisone 10 mg PO daily to help boost her appetite.  LDH is down to 237. CBC and CMP are stable.  She will get Xgeva today as well.  We will plan to see her back in 1 month for repeat labs and follow-up.  She will contact our office with any questions or concerns. We can certainly see her sooner if need be.   Eliezer Bottom, NP 11/3/20172:34 PM

## 2016-01-01 ENCOUNTER — Other Ambulatory Visit: Payer: Self-pay | Admitting: Family

## 2016-01-07 ENCOUNTER — Ambulatory Visit: Payer: Medicare PPO | Attending: Orthopaedic Surgery | Admitting: Physical Therapy

## 2016-01-07 ENCOUNTER — Encounter: Payer: Self-pay | Admitting: Physical Therapy

## 2016-01-07 DIAGNOSIS — M6281 Muscle weakness (generalized): Secondary | ICD-10-CM | POA: Insufficient documentation

## 2016-01-07 DIAGNOSIS — M25562 Pain in left knee: Secondary | ICD-10-CM

## 2016-01-07 NOTE — Therapy (Signed)
[facility] [place], [place], [address] Phone: [phone]   Fax:  [phone]  Physical Therapy Evaluation  Patient Details  Name: Cathy Lewis MRN: [id number] Date of Birth: 21-Feb-1946 Referring Provider: , peter  Encounter Date: 01/07/2016      PT End of Session - 01/07/16 1008    Visit Number 1   Date for PT Re-Evaluation 02/04/16   PT Start Time 0941   PT Stop Time 1009   PT Time Calculation (min) 28 min   Activity Tolerance Patient tolerated treatment well   Behavior During Therapy [facility] for tasks assessed/performed      Past Medical History:  Diagnosis Date  . Arthritis    arthritis in knees and back   . Asthma   . Atypical chest pain    NORMAL CARDIOLITE STUDY   . Complication of anesthesia    hard to wake up from umb hernia-was given alot of meds  . Depression   . Dyslipidemia   . GERD (gastroesophageal reflux disease)   . Headache   . Hypertension   . Metastatic renal cell carcinoma to bone ([place]) 11/20/2014  . Seasonal allergies   . Syncope    HISTORY OF    Past Surgical History:  Procedure Laterality Date  . ABDOMINAL HYSTERECTOMY  2004   rectocele  . APPENDECTOMY    . BUNIONECTOMY Left 01-11-2013   left foot  . BUNIONECTOMY Right 12-31-2006   with hammer toe  . CARPAL TUNNEL RELEASE     R  . CHOLECYSTECTOMY N/A 08/27/2015   Procedure: LAPAROSCOPIC CHOLECYSTECTOMY WITH INTRAOPERATIVE CHOLANGIOGRAM;  Surgeon: [name], MD;  Location: [place];  Service: General;  Laterality: N/A;  . COLONOSCOPY    . CYSTOCELE REPAIR    . DILATION AND CURETTAGE OF UTERUS    . HERNIA REPAIR  2014    umb, right and left  . INGUINAL HERNIA REPAIR Left 01/03/2014   Procedure: LEFT INGUINAL HERNIA REPAIR ;  Surgeon: [name], MD;  Location: [facility];  Service: General;  Laterality: Left;  . INGUINAL HERNIA REPAIR Right 04/06/2015   Procedure: HERNIA REPAIR RIGHT  INGUINAL ADULT WITH  MESH;  Surgeon: [name], MD;  Location: [facility];  Service: General;  Laterality: Right;  . INSERTION OF MESH Left 01/03/2014   Procedure: INSERTION OF MESH;  Surgeon: [name], MD;  Location: [place];  Service: General;  Laterality: Left;  . INSERTION OF MESH Right 04/06/2015   Procedure: INSERTION OF MESH;  Surgeon: [name], MD;  Location: [facility];  Service: General;  Laterality: Right;  . IR GENERIC HISTORICAL  07/03/2015   IR RADIOLOGIST EVAL & MGMT 07/03/2015 Marybelle Killings, MD [facility] INTERV RAD  . JOINT REPLACEMENT    . 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 OTHER SURGICAL HISTORY     bladder vault prolapse  . OTHER SURGICAL HISTORY     right foot bunion and hammer toe surgery   . OTHER SURGICAL HISTORY     right knee arthroscopic   . OTHER SURGICAL HISTORY     right arm ( underarm ) surgery removed ? cyst   . TOTAL KNEE ARTHROPLASTY  03/11/2011   Procedure: TOTAL KNEE ARTHROPLASTY;  Surgeon: Cynda Familia, MD;  Location: [facility];  Service: Orthopedics;  Laterality: Right;  . TUBAL LIGATION     1977    There were no vitals filed for this visit.  Subjective Assessment - 01/07/16 0945    Subjective Pt s/p Lt knee scope 12/20/15   Pertinent History active metastatic caner to bone   Currently in Pain? No/denies            [facility] PT Assessment - 01/07/16 0001      Assessment   Medical Diagnosis proximal hamstring tendonitis and ischial bursitis   Referring Provider , peter   Onset Date/Surgical Date 12/20/15   Prior Therapy no     Precautions   Precautions --  active cancer     Restrictions   Weight Bearing Restrictions No     Balance Screen   Has the patient fallen in the past 6 months No     [place] residence   Home Access Stairs to enter   Entrance Stairs-Number of Steps 2     Prior Function   Level of Independence Independent     Cognition   Overall Cognitive  Status Within Functional Limits for tasks assessed     ROM / Strength   AROM / PROM / Strength Strength  bilat knee and hip ROM WFL     Strength   Overall Strength Comments bilat knee strength 4/5, bilat hip strength 4-/5     Flexibility   Soft Tissue Assessment /Muscle Length --  WFL hamstring, piriformis     Palpation   Palpation comment ttp Lt ischial tuberosity, ITB     Special Tests    Special Tests --  SLR and FABER (-) bilat                   [facility] Adult PT Treatment/Exercise - 01/07/16 0001      Modalities   Modalities Iontophoresis     Iontophoresis   Type of Iontophoresis Dexamethasone   Location Lt ischial tuberosity   Dose 78ml                PT Education - 01/07/16 1007    Education provided Yes   Education Details HEP, PT POC, ionto   Person(s) Educated Patient   Methods Explanation;Demonstration;Handout   Comprehension Returned demonstration;Verbalized understanding             PT Long Term Goals - 01/07/16 1011      PT LONG TERM GOAL #1   Title Pt will be Independent with HEP   Time 4   Period Weeks   Status New     PT LONG TERM GOAL #2   Title Pt will demo 4+/5 Lt LE strength to improve ability to stand without pain   Time 4   Period Weeks   Status New     PT LONG TERM GOAL #3   Title Pt will perform stair negotiation for home entry without increase in symptoms   Time 4   Period Weeks   Status New               Plan - 01/07/16 1009    Clinical Impression Statement Pt presents with decreased LE strength which limits her ability to perform stairs and stand for long periods without pain. pt with ROM WFL but limited by strength in hips and knees. Pt will benefit from skliled PT to address deficits and improve functional mobility   Rehab Potential Good   PT Frequency 2x / week   PT Duration 4 weeks   PT Treatment/Interventions Iontophoresis 4mg /ml Dexamethasone;Cryotherapy;Camera operator;Therapeutic exercise;Gait training;Stair training;Neuromuscular re-education;Taping;Patient/family education;Therapeutic activities;Functional mobility training;Manual techniques  PT Next Visit Plan asssess HEP and Ionto, d/c to HEP if appropriate or continue to f/u on a weekly basis   PT Home Exercise Plan LE strengthening   Consulted and Agree with Plan of Care Patient      Patient will benefit from skilled therapeutic intervention in order to improve the following deficits and impairments:  Decreased activity tolerance, Difficulty walking, Decreased endurance, Decreased mobility, Decreased strength  Visit Diagnosis: Muscle weakness (generalized) - Plan: PT plan of care cert/re-cert  Acute pain of left knee - Plan: PT plan of care cert/re-cert      G-Codes - [id number]    Functional Assessment Tool Used clinical judgement   Functional Limitation Mobility: Walking and moving around   Mobility: Walking and Moving Around Current Status [phone]) At least 20 percent but less than 40 percent impaired, limited or restricted   Mobility: Walking and Moving Around Goal Status [phone]) At least 1 percent but less than 20 percent impaired, limited or restricted       Problem List Patient Active Problem List   Diagnosis Date Noted  . Cough variant asthma 05/02/2015  . Metastatic renal cell carcinoma to bone ([place]) 11/20/2014  . Malignant neoplasm metastatic to bone ([place]) 08/29/2014  . H/O therapeutic radiation 08/29/2014  . Carcinoma of kidney ([place]) 08/29/2014  . LBP (low back pain) 08/29/2014  . Atypical chest pain   . Dyslipidemia   . Hypertension   . Syncope     Isabelle Course, PT, DPT 01/07/2016, 10:15 AM  [facility] [address] [place], [place], [address] Phone: [phone]   Fax:  [phone]  Name: Cathy Lewis MRN: [id number] Date of Birth: 09-10-1945

## 2016-01-07 NOTE — Patient Instructions (Addendum)
Straight Leg: with Bent Knee (Supine)    With right leg straight, other leg bent, raise straight leg ____ inches. Repeat ____ times per set. Do ____ sets per session. Do ____ sessions per day.  http://orth.exer.us/686   Copyright  VHI. All rights reserved.  Strengthening: Terminal Knee Extension (Supine)    With right knee over bolster, straighten knee by tightening muscles on top of thigh. Keep bottom of knee on bolster. Repeat ____ times per set. Do ____ sets per session. Do ____ sessions per day.  http://orth.exer.us/626   Copyright  VHI. All rights reserved.  Strengthening: Hip Abduction (Side-Lying)    Tighten muscles on front of left thigh, then lift leg ____ inches from surface, keeping knee locked.  Repeat ____ times per set. Do ____ sets per session. Do ____ sessions per day.  http://orth.exer.us/622   Copyright  VHI. All rights reserved.  Clam Shell 45 Degrees    Lying with hips and knees bent 45, one pillow between knees and ankles. Lift knee. Be sure pelvis does not roll backward. Do not arch back. Do ___ times, each leg, ___ times per day.  http://ss.exer.us/74   Copyright  VHI. All rights reserved.

## 2016-01-09 ENCOUNTER — Ambulatory Visit: Payer: Medicare PPO | Admitting: Physical Therapy

## 2016-01-14 ENCOUNTER — Ambulatory Visit: Payer: Medicare PPO | Admitting: Physical Therapy

## 2016-01-14 DIAGNOSIS — M25562 Pain in left knee: Secondary | ICD-10-CM

## 2016-01-14 DIAGNOSIS — M6281 Muscle weakness (generalized): Secondary | ICD-10-CM

## 2016-01-14 NOTE — Therapy (Signed)
[facility] [place], [place], [address] Phone: [phone]   Fax:  [phone]  Patient Details  Name: Cathy Lewis MRN: [id number] Date of Birth: 23-Jul-1945 Referring Provider:  Stephens Shire, MD  Encounter Date: 01/14/2016   Pt arrived to PT stating she has been performing HEP and is now able to perform stairs reciprocally without increase in pain. Pt states she feels comfortable performing HEP on her own with no follow up necessary.  PT encouraged pt to perform regular walking or exercise program to continue strengthening.  Pt with no further PT needs at this time, all goals met, d/c from PT.   Emmagene Ortner 01/14/2016, 11:28 AM  [facility] [address] [place], [place], [address] Phone: [phone]   Fax:  [phone]

## 2016-01-23 ENCOUNTER — Ambulatory Visit: Payer: BC Managed Care – PPO | Admitting: Podiatry

## 2016-01-31 MED FILL — CABOMETYX 40 MG TABLET: 40 | 30 days supply | Qty: 30 | Fill #3

## 2016-02-01 ENCOUNTER — Other Ambulatory Visit (HOSPITAL_BASED_OUTPATIENT_CLINIC_OR_DEPARTMENT_OTHER): Payer: Medicare PPO

## 2016-02-01 ENCOUNTER — Other Ambulatory Visit: Payer: Medicare PPO

## 2016-02-01 ENCOUNTER — Ambulatory Visit (HOSPITAL_BASED_OUTPATIENT_CLINIC_OR_DEPARTMENT_OTHER): Payer: Medicare PPO | Admitting: Hematology & Oncology

## 2016-02-01 ENCOUNTER — Ambulatory Visit (HOSPITAL_BASED_OUTPATIENT_CLINIC_OR_DEPARTMENT_OTHER): Payer: Medicare PPO

## 2016-02-01 VITALS — BP 122/67 | HR 95 | Temp 98.0°F | Resp 18 | Wt 121.2 lb

## 2016-02-01 DIAGNOSIS — C7951 Secondary malignant neoplasm of bone: Secondary | ICD-10-CM

## 2016-02-01 DIAGNOSIS — C649 Malignant neoplasm of unspecified kidney, except renal pelvis: Secondary | ICD-10-CM

## 2016-02-01 LAB — CBC WITH DIFFERENTIAL (CANCER CENTER ONLY)
BASO#: 0 10*3/uL (ref 0.0–0.2)
BASO%: 0.2 % (ref 0.0–2.0)
EOS ABS: 0 10*3/uL (ref 0.0–0.5)
EOS%: 0.2 % (ref 0.0–7.0)
HCT: 37.5 % (ref 34.8–46.6)
HGB: 12.2 g/dL (ref 11.6–15.9)
LYMPH#: 1.2 10*3/uL (ref 0.9–3.3)
LYMPH%: 18.9 % (ref 14.0–48.0)
MCH: 31.5 pg (ref 26.0–34.0)
MCHC: 32.5 g/dL (ref 32.0–36.0)
MCV: 97 fL (ref 81–101)
MONO#: 0.2 10*3/uL (ref 0.1–0.9)
MONO%: 3.2 % (ref 0.0–13.0)
NEUT#: 4.9 10*3/uL (ref 1.5–6.5)
NEUT%: 77.5 % (ref 39.6–80.0)
PLATELETS: 193 10*3/uL (ref 145–400)
RBC: 3.87 10*6/uL (ref 3.70–5.32)
RDW: 19.4 % — ABNORMAL HIGH (ref 11.1–15.7)
WBC: 6.3 10*3/uL (ref 3.9–10.0)

## 2016-02-01 LAB — CMP (CANCER CENTER ONLY)
ALK PHOS: 64 U/L (ref 26–84)
ALT: 37 U/L (ref 10–47)
AST: 37 U/L (ref 11–38)
Albumin: 3.7 g/dL (ref 3.3–5.5)
BUN: 14 mg/dL (ref 7–22)
CHLORIDE: 103 meq/L (ref 98–108)
CO2: 25 mEq/L (ref 18–33)
Calcium: 9.4 mg/dL (ref 8.0–10.3)
Creat: 1.3 mg/dl — ABNORMAL HIGH (ref 0.6–1.2)
GLUCOSE: 128 mg/dL — AB (ref 73–118)
POTASSIUM: 4.1 meq/L (ref 3.3–4.7)
Sodium: 140 mEq/L (ref 128–145)
Total Bilirubin: 0.5 mg/dl (ref 0.20–1.60)
Total Protein: 6.9 g/dL (ref 6.4–8.1)

## 2016-02-01 LAB — LACTATE DEHYDROGENASE: LDH: 309 U/L — AB (ref 125–245)

## 2016-02-01 MED ORDER — DENOSUMAB 120 MG/1.7ML ~~LOC~~ SOLN
120.0000 mg | Freq: Once | SUBCUTANEOUS | Status: AC
  Administered 2016-02-01: 120 mg via SUBCUTANEOUS
  Filled 2016-02-01: qty 1.7

## 2016-02-01 NOTE — Progress Notes (Signed)
Hematology and Oncology Follow Up Visit  ANISIA CEJA [id number] 1945/03/09 70 y.o. 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 Damo is here today for follow-up. She really looks good. She only has some complaints about her heels. I am not sure as to what that might be. I don't think it is from the Cabometyx. I told her to try some cortisone cream.  She is not being bothered by her left knee. She had surgery for this. This was arthroscopic surgery. She apparently fell in the yard earlier this week. Thank you, she did not do any damage to the knee.  She is gaining weight. She had a good Thanksgiving. She ate quite a bit.  She is having some back discomfort. This all happens when she stands for a long amount of time. When she walks, there is no pain. Per she's had no cough.   I'm just so happy that she finally is looking better. This really is a good way to end the year for her.  Overall, her performance status is ECOG 1.  She is doing well with the Cabometyx every other day dosing. The diarrhea is not nearly as much of an issue.  Medications:    Medication List       Accurate as of 02/01/16 12:50 PM. Always use your most recent med list.          ADVAIR DISKUS 100-50 MCG/DOSE Aepb Generic drug:  Fluticasone-Salmeterol Inhale 1 puff into the lungs 2 (two) times daily before a meal.   albuterol 90 MCG/ACT inhaler Commonly known as:  PROVENTIL,VENTOLIN Inhale 2 puffs into the lungs every 4 (four) hours as needed for wheezing.   amLODipine 5 MG tablet Commonly known as:  NORVASC Take 5 mg by mouth daily.   cabozantinib S-Malate 40 MG  Tabs Commonly known as:  CABOMETYX Take 40 mg by mouth daily.   CALCIUM + D PO Take 1 tablet by mouth daily.   dicyclomine 20 MG tablet Commonly known as:  BENTYL Take 20 mg by mouth 3 (three) times daily as needed (Stomach Cramps).   fentaNYL 12 MCG/HR Commonly known as:  DURAGESIC - dosed mcg/hr Place 2 patches (25 mcg total) onto the skin every 3 (three) days.   HYDROcodone-acetaminophen 5-325 MG tablet Commonly known as:  NORCO/VICODIN Take 1-2 tablets by mouth every 4 (four) hours as needed for moderate pain or severe pain.   LORazepam 0.5 MG tablet Commonly known as:  ATIVAN Take 0.5 mg by mouth every 4 (four) hours as needed for anxiety.   Magnesium 250 MG Tabs Take 250 mg by mouth daily.   meclizine 25 MG tablet Commonly known as:  ANTIVERT Take 12.5 mg by mouth 3 (three) times daily as needed. For dizziness.   metoCLOPramide 10 MG tablet Commonly known as:  REGLAN Take 1 tablet (10 mg total) by mouth every 6 (six) hours as needed for nausea.   montelukast 10 MG tablet Commonly known as:  SINGULAIR Take 10 mg by mouth at bedtime.   nystatin 100000 UNIT/ML suspension Commonly known as:  MYCOSTATIN Take 5 mLs (500,000 Units total) by mouth  4 (four) times daily.   OSTEO BI-FLEX ADV DOUBLE ST PO Take 1 tablet by mouth daily.   pilocarpine 5 MG tablet Commonly known as:  SALAGEN Take 1 tablet (5 mg total) by mouth 2 (two) times daily.   predniSONE 10 MG tablet Commonly known as:  DELTASONE Take 1 tablet (10 mg total) by mouth daily with breakfast.   PROBIOTIC DAILY PO Take 1 tablet by mouth daily.   ranitidine 150 MG tablet Commonly known as:  ZANTAC Take 150 mg by mouth 2 (two) times daily.   vitamin C 500 MG tablet Commonly known as:  ASCORBIC ACID Take 500 mg by mouth daily.   XGEVA 120 MG/1.7ML Soln injection Generic drug:  denosumab Inject 120 mg into the skin every 30 (thirty) days.       Allergies:  Allergies  Allergen Reactions  .  Azithromycin Other (See Comments)    Stomach cramps.  Green black stool   . Ciprofloxacin Other (See Comments)    Body aches Chills Heart races  . Etodolac Palpitations and Other (See Comments)    Joints hurt   . Gnp [Acetaminophen] Other (See Comments)    "GRN" (cough syrup) Heart races  . Moxifloxacin Palpitations and Other (See Comments)    Body ache  . Tramadol Nausea And Vomiting and Other (See Comments)    Dizziness  . Rofecoxib Other (See Comments)    Pt unsure     Past Medical History, Surgical history, Social history, and Family History were reviewed and updated.  Review of Systems: All other 10 point review of systems is negative.   Physical Exam:  weight is 121 lb 3.2 oz (55 kg). Her oral temperature is 98 F (36.7 C). Her blood pressure is 122/67 and her pulse is 95. Her respiration is 18 and oxygen saturation is 96%.   Wt Readings from Last 3 Encounters:  02/01/16 121 lb 3.2 oz (55 kg)  12/28/15 117 lb 12.8 oz (53.4 kg)  11/29/15 122 lb (55.3 kg)    Thin but well-nourished white female in no obvious distress. Head and neck exam shows no ocular or oral lesions. She has no adenopathy in the neck. Thyroid is nonpalpable. Lungs are clear. Cardiac exam regular rate and rhythm with no murmurs, rubs or bruits. Abdomen is soft and she has good bowel sounds. There is no fluid wave. There is no palpable liver or spleen tip. Back exam shows no tenderness over the spine, ribs or hips. Extremities shows no clubbing, cyanosis or edema. Neurological exam shows no focal neurological deficits. Skin exam shows no rashes, ecchymoses or petechia.  Lab Results  Component Value Date   WBC 6.3 02/01/2016   HGB 12.2 02/01/2016   HCT 37.5 02/01/2016   MCV 97 02/01/2016   PLT 193 02/01/2016   No results found for: FERRITIN, IRON, TIBC, UIBC, IRONPCTSAT Lab Results  Component Value Date   RBC 3.87 02/01/2016   No results found for: KPAFRELGTCHN, LAMBDASER, KAPLAMBRATIO No  results found for: IGGSERUM, IGA, IGMSERUM No results found for: Odetta Pink, SPEI   Chemistry      Component Value Date/Time   NA 140 02/01/2016 1124   NA 140 12/28/2015 1352   K 4.1 02/01/2016 1124   K 4.4 12/28/2015 1352   CL 103 02/01/2016 1124   CO2 25 02/01/2016 1124   CO2 27 12/28/2015 1352   BUN 14 02/01/2016 1124   BUN 21.9 12/28/2015 1352   CREATININE 1.3 (  H) 02/01/2016 1124   CREATININE 1.2 (H) 12/28/2015 1352   GLU 104 07/20/2008      Component Value Date/Time   CALCIUM 9.4 02/01/2016 1124   CALCIUM 9.4 12/28/2015 1352   ALKPHOS 64 02/01/2016 1124   ALKPHOS 78 12/28/2015 1352   AST 37 02/01/2016 1124   AST 28 12/28/2015 1352   ALT 37 02/01/2016 1124   ALT 29 12/28/2015 1352   BILITOT 0.50 02/01/2016 1124   BILITOT 0.30 12/28/2015 1352     Impression and Plan: Ms. Penland is a very pleasant 70 yo white female with metastatic clear cell renal carcinoma diagnosed in 2013.   I must sit that this is the best of see her look all year. Hopefully, this is a good sign that the Cabometyx is doing a good job.  We probably do not have to do another scan on her until late January. We get CAT scans and bone scans. These are done at [facility].  [facility] plan to get her back in one month.  Volanda Napoleon, MD 12/8/201712:50 PM

## 2016-02-01 NOTE — Patient Instructions (Signed)
Denosumab injection What is this medicine? DENOSUMAB (den oh sue mab) slows bone breakdown. Prolia is used to treat osteoporosis in women after menopause and in men. Xgeva is used to prevent bone fractures and other bone problems caused by cancer bone metastases. Xgeva is also used to treat giant cell tumor of the bone. This medicine may be used for other purposes; ask your health care provider or pharmacist if you have questions. What should I tell my health care provider before I take this medicine? They need to know if you have any of these conditions: -dental disease -eczema -infection or history of infections -kidney disease or on dialysis -low blood calcium or vitamin D -malabsorption syndrome -scheduled to have surgery or tooth extraction -taking medicine that contains denosumab -thyroid or parathyroid disease -an unusual reaction to denosumab, other medicines, foods, dyes, or preservatives -pregnant or trying to get pregnant -breast-feeding How should I use this medicine? This medicine is for injection under the skin. It is given by a health care professional in a hospital or clinic setting. If you are getting Prolia, a special MedGuide will be given to you by the pharmacist with each prescription and refill. Be sure to read this information carefully each time. For Prolia, talk to your pediatrician regarding the use of this medicine in children. Special care may be needed. For Xgeva, talk to your pediatrician regarding the use of this medicine in children. While this drug may be prescribed for children as young as 13 years for selected conditions, precautions do apply. Overdosage: If you think you have taken too much of this medicine contact a poison control center or emergency room at once. NOTE: This medicine is only for you. Do not share this medicine with others. What if I miss a dose? It is important not to miss your dose. Call your doctor or health care professional if you are  unable to keep an appointment. What may interact with this medicine? Do not take this medicine with any of the following medications: -other medicines containing denosumab This medicine may also interact with the following medications: -medicines that suppress the immune system -medicines that treat cancer -steroid medicines like prednisone or cortisone This list may not describe all possible interactions. Give your health care provider a list of all the medicines, herbs, non-prescription drugs, or dietary supplements you use. Also tell them if you smoke, drink alcohol, or use illegal drugs. Some items may interact with your medicine. What should I watch for while using this medicine? Visit your doctor or health care professional for regular checks on your progress. Your doctor or health care professional may order blood tests and other tests to see how you are doing. Call your doctor or health care professional if you get a cold or other infection while receiving this medicine. Do not treat yourself. This medicine may decrease your body's ability to fight infection. You should make sure you get enough calcium and vitamin D while you are taking this medicine, unless your doctor tells you not to. Discuss the foods you eat and the vitamins you take with your health care professional. See your dentist regularly. Brush and floss your teeth as directed. Before you have any dental work done, tell your dentist you are receiving this medicine. Do not become pregnant while taking this medicine or for 5 months after stopping it. Women should inform their doctor if they wish to become pregnant or think they might be pregnant. There is a potential for serious side effects   to an unborn child. Talk to your health care professional or pharmacist for more information. What side effects may I notice from receiving this medicine? Side effects that you should report to your doctor or health care professional as soon as  possible: -allergic reactions like skin rash, itching or hives, swelling of the face, lips, or tongue -breathing problems -chest pain -fast, irregular heartbeat -feeling faint or lightheaded, falls -fever, chills, or any other sign of infection -muscle spasms, tightening, or twitches -numbness or tingling -skin blisters or bumps, or is dry, peels, or red -slow healing or unexplained pain in the mouth or jaw -unusual bleeding or bruising Side effects that usually do not require medical attention (Report these to your doctor or health care professional if they continue or are bothersome.): -muscle pain -stomach upset, gas This list may not describe all possible side effects. Call your doctor for medical advice about side effects. You may report side effects to FDA at 1-800-FDA-1088. Where should I keep my medicine? This medicine is only given in a clinic, doctor's office, or other health care setting and will not be stored at home. NOTE: This sheet is a summary. It may not cover all possible information. If you have questions about this medicine, talk to your doctor, pharmacist, or health care provider.    2016, Elsevier/Gold Standard. (2011-08-11 12:37:47) Influenza Virus Vaccine (Flucelvax) What is this medicine? INFLUENZA VIRUS VACCINE (in floo EN zuh VAHY ruhs vak SEEN) helps to reduce the risk of getting influenza also known as the flu. The vaccine only helps protect you against some strains of the flu. This medicine may be used for other purposes; ask your health care provider or pharmacist if you have questions. What should I tell my health care provider before I take this medicine? They need to know if you have any of these conditions: -bleeding disorder like hemophilia -fever or infection -Guillain-Barre syndrome or other neurological problems -immune system problems -infection with the human immunodeficiency virus (HIV) or AIDS -low blood platelet counts -multiple  sclerosis -an unusual or allergic reaction to influenza virus vaccine, other medicines, foods, dyes or preservatives -pregnant or trying to get pregnant -breast-feeding How should I use this medicine? This vaccine is for injection into a muscle. It is given by a health care professional. A copy of Vaccine Information Statements will be given before each vaccination. Read this sheet carefully each time. The sheet may change frequently. Talk to your pediatrician regarding the use of this medicine in children. Special care may be needed. Overdosage: If you think you've taken too much of this medicine contact a poison control center or emergency room at once. Overdosage: If you think you have taken too much of this medicine contact a poison control center or emergency room at once. NOTE: This medicine is only for you. Do not share this medicine with others. What if I miss a dose? This does not apply. What may interact with this medicine? -chemotherapy or radiation therapy -medicines that lower your immune system like etanercept, anakinra, infliximab, and adalimumab -medicines that treat or prevent blood clots like warfarin -phenytoin -steroid medicines like prednisone or cortisone -theophylline -vaccines This list may not describe all possible interactions. Give your health care provider a list of all the medicines, herbs, non-prescription drugs, or dietary supplements you use. Also tell them if you smoke, drink alcohol, or use illegal drugs. Some items may interact with your medicine. What should I watch for while using this medicine? Report any side effects   that do not go away within 3 days to your doctor or health care professional. Call your health care provider if any unusual symptoms occur within 6 weeks of receiving this vaccine. You may still catch the flu, but the illness is not usually as bad. You cannot get the flu from the vaccine. The vaccine will not protect against colds or other  illnesses that may cause fever. The vaccine is needed every year. What side effects may I notice from receiving this medicine? Side effects that you should report to your doctor or health care professional as soon as possible: -allergic reactions like skin rash, itching or hives, swelling of the face, lips, or tongue Side effects that usually do not require medical attention (Report these to your doctor or health care professional if they continue or are bothersome.): -fever -headache -muscle aches and pains -pain, tenderness, redness, or swelling at the injection site -tiredness This list may not describe all possible side effects. Call your doctor for medical advice about side effects. You may report side effects to FDA at 1-800-FDA-1088. Where should I keep my medicine? The vaccine will be given by a health care professional in a clinic, pharmacy, doctor's office, or other health care setting. You will not be given vaccine doses to store at home. NOTE: This sheet is a summary. It may not cover all possible information. If you have questions about this medicine, talk to your doctor, pharmacist, or health care provider.    2016, Elsevier/Gold Standard. (2011-01-22 14:06:47)  

## 2016-02-08 ENCOUNTER — Other Ambulatory Visit: Payer: Self-pay | Admitting: Family

## 2016-02-08 DIAGNOSIS — M109 Gout, unspecified: Secondary | ICD-10-CM

## 2016-02-08 MED ORDER — COLCHICINE 0.6 MG PO TABS: 0.6000 mg | ORAL_TABLET | Freq: Every day | ORAL | 0 refills | Status: DC

## 2016-02-08 NOTE — Progress Notes (Signed)
Patient had gout attack in both feet. She states that she got clearance from her urologist to take Colchicine. Her PCP office is closed and she is in a good bit of pain. I spoke with [name] and we will give her a week supply. Prescription was sent to her [facility] in [place].

## 2016-02-28 ENCOUNTER — Telehealth: Payer: Self-pay | Admitting: *Deleted

## 2016-02-28 NOTE — Telephone Encounter (Signed)
Patient has had chronic painful headaches. She has been to her PCP who has prescribed antibiotics for a sinus infection and has suggested a follow up CT scan of her head. Patient would like to know if Dr Marin Olp has any further suggestions, or if she should follow the plan given by her PCP.  Reviewed with Dr Marin Olp and he agrees with PCP's plan. Patient aware and will follow through.

## 2016-02-29 ENCOUNTER — Telehealth: Payer: Self-pay | Admitting: *Deleted

## 2016-02-29 ENCOUNTER — Encounter: Payer: Self-pay | Admitting: Interventional Radiology

## 2016-02-29 NOTE — Telephone Encounter (Signed)
Patient wants to know if it's okay if she take sumatriptan for migraines.   Spoke with Dr Marin Olp and he is okay with this. Patient aware. She will contact her PCP

## 2016-03-01 ENCOUNTER — Encounter (HOSPITAL_BASED_OUTPATIENT_CLINIC_OR_DEPARTMENT_OTHER): Payer: Self-pay | Admitting: Emergency Medicine

## 2016-03-01 ENCOUNTER — Emergency Department (HOSPITAL_BASED_OUTPATIENT_CLINIC_OR_DEPARTMENT_OTHER)
Admission: EM | Admit: 2016-03-01 | Discharge: 2016-03-01 | Disposition: A | Discharge status: Home / Self Care | Payer: Medicare PPO | Attending: Emergency Medicine | Admitting: Emergency Medicine

## 2016-03-01 ENCOUNTER — Emergency Department (HOSPITAL_BASED_OUTPATIENT_CLINIC_OR_DEPARTMENT_OTHER): Payer: Medicare PPO

## 2016-03-01 DIAGNOSIS — I10 Essential (primary) hypertension: Secondary | ICD-10-CM | POA: Insufficient documentation

## 2016-03-01 DIAGNOSIS — Z87891 Personal history of nicotine dependence: Secondary | ICD-10-CM | POA: Diagnosis not present

## 2016-03-01 DIAGNOSIS — J45909 Unspecified asthma, uncomplicated: Secondary | ICD-10-CM | POA: Insufficient documentation

## 2016-03-01 DIAGNOSIS — R1013 Epigastric pain: Secondary | ICD-10-CM | POA: Insufficient documentation

## 2016-03-01 DIAGNOSIS — R52 Pain, unspecified: Secondary | ICD-10-CM

## 2016-03-01 DIAGNOSIS — R51 Headache: Secondary | ICD-10-CM | POA: Insufficient documentation

## 2016-03-01 DIAGNOSIS — Z79899 Other long term (current) drug therapy: Secondary | ICD-10-CM | POA: Diagnosis not present

## 2016-03-01 LAB — CBC WITH DIFFERENTIAL/PLATELET
BASOS ABS: 0 10*3/uL (ref 0.0–0.1)
BASOS PCT: 1 %
EOS PCT: 2 %
Eosinophils Absolute: 0.1 10*3/uL (ref 0.0–0.7)
HCT: 37.5 % (ref 36.0–46.0)
Hemoglobin: 11.9 g/dL — ABNORMAL LOW (ref 12.0–15.0)
Lymphocytes Relative: 21 %
Lymphs Abs: 1.2 10*3/uL (ref 0.7–4.0)
MCH: 30.5 pg (ref 26.0–34.0)
MCHC: 31.7 g/dL (ref 30.0–36.0)
MCV: 96.2 fL (ref 78.0–100.0)
MONO ABS: 0.4 10*3/uL (ref 0.1–1.0)
Monocytes Relative: 7 %
Neutro Abs: 3.8 10*3/uL (ref 1.7–7.7)
Neutrophils Relative %: 69 %
PLATELETS: 296 10*3/uL (ref 150–400)
RBC: 3.9 MIL/uL (ref 3.87–5.11)
RDW: 19.8 % — AB (ref 11.5–15.5)
WBC: 5.5 10*3/uL (ref 4.0–10.5)

## 2016-03-01 LAB — URINALYSIS, ROUTINE W REFLEX MICROSCOPIC
BILIRUBIN URINE: NEGATIVE
Glucose, UA: NEGATIVE mg/dL
Hgb urine dipstick: NEGATIVE
KETONES UR: NEGATIVE mg/dL
NITRITE: NEGATIVE
PROTEIN: NEGATIVE mg/dL
Specific Gravity, Urine: 1.005 (ref 1.005–1.030)
pH: 7 (ref 5.0–8.0)

## 2016-03-01 LAB — COMPREHENSIVE METABOLIC PANEL
ALBUMIN: 3.7 g/dL (ref 3.5–5.0)
ALK PHOS: 64 U/L (ref 38–126)
ALT: 21 U/L (ref 14–54)
ANION GAP: 8 (ref 5–15)
AST: 24 U/L (ref 15–41)
BILIRUBIN TOTAL: 0.3 mg/dL (ref 0.3–1.2)
BUN: 14 mg/dL (ref 6–20)
CALCIUM: 9.2 mg/dL (ref 8.9–10.3)
CO2: 23 mmol/L (ref 22–32)
Chloride: 106 mmol/L (ref 101–111)
Creatinine, Ser: 1.24 mg/dL — ABNORMAL HIGH (ref 0.44–1.00)
GFR calc Af Amer: 49 mL/min — ABNORMAL LOW (ref >60)
GFR, EST NON AFRICAN AMERICAN: 42 mL/min — AB (ref >60)
GLUCOSE: 102 mg/dL — AB (ref 65–99)
POTASSIUM: 4 mmol/L (ref 3.5–5.1)
Sodium: 137 mmol/L (ref 135–145)
TOTAL PROTEIN: 7.1 g/dL (ref 6.5–8.1)

## 2016-03-01 LAB — LIPASE, BLOOD: LIPASE: 22 U/L (ref 11–51)

## 2016-03-01 LAB — URINALYSIS, MICROSCOPIC (REFLEX): RBC / HPF: NONE SEEN RBC/hpf (ref 0–5)

## 2016-03-01 LAB — SEDIMENTATION RATE: Sed Rate: 85 mm/hr — ABNORMAL HIGH (ref 0–22)

## 2016-03-01 MED ORDER — MORPHINE SULFATE (PF) 4 MG/ML IV SOLN
4.0000 mg | Freq: Once | INTRAVENOUS | Status: AC
  Administered 2016-03-01: 4 mg via INTRAVENOUS
  Filled 2016-03-01: qty 1

## 2016-03-01 MED ORDER — HYDROCODONE-ACETAMINOPHEN 5-325 MG PO TABS: 1.0000 | ORAL_TABLET | Freq: Four times a day (QID) | ORAL | 0 refills | Status: DC | PRN

## 2016-03-01 MED ORDER — PREDNISONE 20 MG PO TABS: ORAL_TABLET | ORAL | 0 refills | Status: DC

## 2016-03-01 MED ORDER — METOCLOPRAMIDE HCL 5 MG/ML IJ SOLN
5.0000 mg | Freq: Once | INTRAMUSCULAR | Status: AC
Start: 2016-03-01 — End: 2016-03-01
  Administered 2016-03-01: 5 mg via INTRAVENOUS
  Filled 2016-03-01: qty 2

## 2016-03-01 MED ORDER — PREDNISONE 20 MG PO TABS
40.0000 mg | ORAL_TABLET | Freq: Every day | ORAL | Status: DC
  Administered 2016-03-01: 40 mg via ORAL
  Filled 2016-03-01: qty 2

## 2016-03-01 NOTE — ED Triage Notes (Addendum)
Pt reports HA x 2 weeks, seen by PCP and dx with migraine. Pt has been taking prescribing meds without relief. Pt also reports upper abd pain x 3 days with nausea, no vomiting or diarrhea. Pt also reports neg head CT yesterday.

## 2016-03-01 NOTE — ED Notes (Signed)
Patient transported to CT 

## 2016-03-01 NOTE — ED Provider Notes (Signed)
[facility] Provider Note   CSN: [id number] Arrival date & time: 03/01/16  [id number]     History   Chief Complaint Chief Complaint  Patient presents with  . Headache  . Abdominal Pain    HPI Cathy Lewis is a 71 y.o. female.Complains of headache originating at right para occipital area and radiating to right temporal area gradual onset 2 weeks ago. Patient reports she had a CT scan of her head performed at an outside facility yesterday which was ordered by her PMD . She denies any visual changes or eye pain CT scan was read as no acute intracranial abnormality, inflammatory changes in sphenoid sinus. She was prescribed sumatriptan been which she is taking without relief. She also reports epigastric abdominal pain for the past 3 or 4 days. Nothing makes pain better or worse. No other associated symptoms. She denies urinary symptoms. No nausea or vomiting. No appetite change. No chest pain no shortness of breath. Pain is nonexertional. No treatment prior to coming here. Pain is constant  HPI  Past Medical History:  Diagnosis Date  . Arthritis    arthritis in knees and back   . Asthma   . Atypical chest pain    NORMAL CARDIOLITE STUDY   . Complication of anesthesia    hard to wake up from umb hernia-was given alot of meds  . Depression   . Dyslipidemia   . GERD (gastroesophageal reflux disease)   . Headache   . Hypertension   . 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 Seasonal allergies   . Syncope    HISTORY OF    Patient Active Problem List   Diagnosis Date Noted  . Cough variant asthma 05/02/2015  . 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 Malignant neoplasm metastatic to bone ([place]) 08/29/2014  . H/O therapeutic radiation 08/29/2014  . Carcinoma of kidney ([place]) 08/29/2014  . LBP (low back pain) 08/29/2014  . Atypical chest pain   . Dyslipidemia   . Hypertension   . Syncope     Past Surgical History:  Procedure  Laterality Date  . ABDOMINAL HYSTERECTOMY  2004   rectocele  . APPENDECTOMY    . BUNIONECTOMY Left 01-11-2013   left foot  . BUNIONECTOMY Right 12-31-2006   with hammer toe  . CARPAL TUNNEL RELEASE     R  . CHOLECYSTECTOMY N/A 08/27/2015   Procedure: LAPAROSCOPIC CHOLECYSTECTOMY WITH INTRAOPERATIVE CHOLANGIOGRAM;  Surgeon: [name], MD;  Location: [facility];  Service: General;  Laterality: N/A;  . COLONOSCOPY    . CYSTOCELE REPAIR    . DILATION AND CURETTAGE OF UTERUS    . HERNIA REPAIR  2014    umb, right and left  . INGUINAL HERNIA REPAIR Left 01/03/2014   Procedure: LEFT INGUINAL HERNIA REPAIR ;  Surgeon: [name], MD;  Location: [facility];  Service: General;  Laterality: Left;  . INGUINAL HERNIA REPAIR Right 04/06/2015   Procedure: HERNIA REPAIR RIGHT  INGUINAL ADULT WITH MESH;  Surgeon: [name], MD;  Location: [facility];  Service: General;  Laterality: Right;  . INSERTION OF MESH Left 01/03/2014   Procedure: INSERTION OF MESH;  Surgeon: [name], MD;  Location: [facility];  Service: General;  Laterality: Left;  . INSERTION OF MESH Right 04/06/2015   Procedure: INSERTION OF MESH;  Surgeon: [name], MD;  Location: [facility];  Service: General;  Laterality: Right;  . IR GENERIC HISTORICAL  07/03/2015  IR RADIOLOGIST EVAL & MGMT 07/03/2015 Marybelle Killings, MD [facility] INTERV RAD  . IR GENERIC HISTORICAL  05/24/2015   IR RADIOLOGIST EVAL & MGMT 05/24/2015 Marybelle Killings, MD [facility] INTERV RAD  . JOINT REPLACEMENT    . 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 OTHER SURGICAL HISTORY     bladder vault prolapse  . OTHER SURGICAL HISTORY     right foot bunion and hammer toe surgery   . OTHER SURGICAL HISTORY     right knee arthroscopic   . OTHER SURGICAL HISTORY     right arm ( underarm ) surgery removed ? cyst   . TOTAL KNEE ARTHROPLASTY  03/11/2011   Procedure: TOTAL KNEE ARTHROPLASTY;  Surgeon: Cynda Familia, MD;  Location: [facility];  Service: Orthopedics;  Laterality: Right;  . TUBAL LIGATION     1977    OB History    No data available       Home Medications    Prior to Admission medications   Medication Sig Start Date End Date Taking? Authorizing Provider  albuterol (PROVENTIL,VENTOLIN) 90 MCG/ACT inhaler Inhale 2 puffs into the lungs every 4 (four) hours as needed for wheezing.    Yes Historical Provider, MD  cabozantinib S-Malate (CABOMETYX) 40 MG TABS Take 40 mg by mouth daily. Patient taking differently: Take 40 mg by mouth every other day.  10/31/15  Yes Volanda Napoleon, MD  Calcium Carbonate-Vitamin D (CALCIUM + D PO) Take 1 tablet by mouth daily.    Yes Historical Provider, MD  denosumab (XGEVA) 120 MG/1.7ML SOLN injection Inject 120 mg into the skin every 30 (thirty) days.   Yes Historical Provider, MD  dicyclomine (BENTYL) 20 MG tablet Take 20 mg by mouth 3 (three) times daily as needed (Stomach Cramps).   Yes Historical Provider, MD  Fluticasone-Salmeterol (ADVAIR DISKUS) 100-50 MCG/DOSE AEPB Inhale 1 puff into the lungs 2 (two) times daily before a meal.   Yes Historical Provider, MD  HYDROcodone-acetaminophen (NORCO/VICODIN) 5-325 MG tablet Take 1-2 tablets by mouth every 4 (four) hours as needed for moderate pain or severe pain. Patient taking differently: Take 0.5 tablets by mouth daily as needed for moderate pain or severe pain.  04/06/15  Yes [name], MD  LORazepam (ATIVAN) 0.5 MG tablet Take 0.5 mg by mouth every 4 (four) hours as needed for anxiety.   Yes Hassie Bruce, OTR  Magnesium 250 MG TABS Take 250 mg by mouth daily.   Yes Historical Provider, MD  meclizine (ANTIVERT) 25 MG tablet Take 12.5 mg by mouth 3 (three) times daily as needed. For dizziness.   Yes Historical Provider, MD  metoCLOPramide (REGLAN) 10 MG tablet Take 1 tablet (10 mg total) by mouth every 6 (six) hours as needed for nausea. 07/25/15  Yes Eliezer Bottom, NP  Misc Natural Products (OSTEO BI-FLEX ADV DOUBLE  ST PO) Take 1 tablet by mouth daily.   Yes Historical Provider, MD  montelukast (SINGULAIR) 10 MG tablet Take 10 mg by mouth at bedtime.    Yes Historical Provider, MD  predniSONE (DELTASONE) 10 MG tablet Take 1 tablet (10 mg total) by mouth daily with breakfast. 12/28/15  Yes Eliezer Bottom, NP  Probiotic Product (PROBIOTIC DAILY PO) Take 1 tablet by mouth daily.    Yes Historical Provider, MD  ranitidine (ZANTAC) 150 MG tablet Take 150 mg by mouth 2 (two) times daily.   Yes Historical Provider, MD  vitamin C (ASCORBIC ACID) 500 MG tablet Take 500  mg by mouth daily.   Yes Historical Provider, MD  amLODipine (NORVASC) 5 MG tablet Take 5 mg by mouth daily.  07/28/15   Historical Provider, MD  colchicine 0.6 MG tablet Take 1 tablet (0.6 mg total) by mouth daily. 02/08/16   Eliezer Bottom, NP  fentaNYL (DURAGESIC - DOSED MCG/HR) 12 MCG/HR Place 2 patches (25 mcg total) onto the skin every 3 (three) days. Patient taking differently: Place 12.5 mcg onto the skin every 3 (three) days.  07/27/15   Volanda Napoleon, MD  nystatin (MYCOSTATIN) 100000 UNIT/ML suspension Take 5 mLs (500,000 Units total) by mouth 4 (four) times daily. Patient not taking: Reported on 02/01/2016 09/25/15   Eliezer Bottom, NP  pilocarpine (SALAGEN) 5 MG tablet Take 1 tablet (5 mg total) by mouth 2 (two) times daily. 10/02/15   Eliezer Bottom, NP    Family History Family History  Problem Relation Age of Onset  . Heart attack Father     DECEASED  . Cancer Mother     DECEASED-unsure of type  . Cancer Brother     LUNG CA- smoked  . Aneurysm Brother     AAA  . Asthma Sister     Social History Social History  Substance Use Topics  . Smoking status: Former Smoker    Packs/day: 0.25    Years: 10.00    Quit date: 02/24/1985  . Smokeless tobacco: Never Used  . Alcohol use No     Allergies   Azithromycin; Ciprofloxacin; Etodolac; Gnp [acetaminophen]; Moxifloxacin; Tramadol; and Rofecoxib   Review of  Systems Review of Systems  Constitutional: Negative.   HENT: Negative.   Respiratory: Negative.   Cardiovascular: Negative.   Gastrointestinal: Positive for abdominal pain.  Musculoskeletal: Negative.   Skin: Negative.   Allergic/Immunologic: Positive for immunocompromised state.       Cancer patient  Neurological: Positive for headaches.  Psychiatric/Behavioral: Negative.   All other systems reviewed and are negative.    Physical Exam Updated Vital Signs BP 119/81 (BP Location: Left Arm)   Pulse 105   Temp 97.7 F (36.5 C) (Oral)   Resp 18   Ht 5\' 4"  (1.626 m)   Wt 120 lb (54.4 kg)   SpO2 100%   BMI 20.60 kg/m   Physical Exam  Constitutional: She is oriented to person, place, and time. She appears well-developed and well-nourished. No distress.  HENT:  Head: Normocephalic and atraumatic.  Eyes: Conjunctivae are normal. Pupils are equal, round, and reactive to light.  Neck: Neck supple. No tracheal deviation present. No thyromegaly present.  Cardiovascular: Normal rate and regular rhythm.   No murmur heard. Pulmonary/Chest: Effort normal and breath sounds normal.  Abdominal: Soft. Bowel sounds are normal. She exhibits no distension and no mass. There is tenderness. There is no rebound and no guarding. No hernia.  Mild tenderness at epigastrium  Musculoskeletal: Normal range of motion. She exhibits no edema or tenderness.  Neurological: She is alert and oriented to person, place, and time. No sensory deficit. She exhibits normal muscle tone. Coordination normal.  Skin: Skin is warm and dry. No rash noted.  Psychiatric: She has a normal mood and affect.  Nursing note and vitals reviewed.    ED Treatments / Results  Labs (all labs ordered are listed, but only abnormal results are displayed) Labs Reviewed  SEDIMENTATION RATE  COMPREHENSIVE METABOLIC PANEL  CBC WITH DIFFERENTIAL/PLATELET  LIPASE, BLOOD    EKG  EKG Interpretation None  Results for orders  placed or performed during the hospital encounter of 03/01/16  Sedimentation rate  Result Value Ref Range   Sed Rate 85 (H) 0 - 22 mm/hr  Comprehensive metabolic panel  Result Value Ref Range   Sodium 137 135 - 145 mmol/L   Potassium 4.0 3.5 - 5.1 mmol/L   Chloride 106 101 - 111 mmol/L   CO2 23 22 - 32 mmol/L   Glucose, Bld 102 (H) 65 - 99 mg/dL   BUN 14 6 - 20 mg/dL   Creatinine, Ser 1.24 (H) 0.44 - 1.00 mg/dL   Calcium 9.2 8.9 - 10.3 mg/dL   Total Protein 7.1 6.5 - 8.1 g/dL   Albumin 3.7 3.5 - 5.0 g/dL   AST 24 15 - 41 U/L   ALT 21 14 - 54 U/L   Alkaline Phosphatase 64 38 - 126 U/L   Total Bilirubin 0.3 0.3 - 1.2 mg/dL   GFR calc non Af Amer 42 (L) >60 mL/min   GFR calc Af Amer 49 (L) >60 mL/min   Anion gap 8 5 - 15  CBC with Differential/Platelet  Result Value Ref Range   WBC 5.5 4.0 - 10.5 K/uL   RBC 3.90 3.87 - 5.11 MIL/uL   Hemoglobin 11.9 (L) 12.0 - 15.0 g/dL   HCT 37.5 36.0 - 46.0 %   MCV 96.2 78.0 - 100.0 fL   MCH 30.5 26.0 - 34.0 pg   MCHC 31.7 30.0 - 36.0 g/dL   RDW 19.8 (H) 11.5 - 15.5 %   Platelets 296 150 - 400 K/uL   Neutrophils Relative % 69 %   Neutro Abs 3.8 1.7 - 7.7 K/uL   Lymphocytes Relative 21 %   Lymphs Abs 1.2 0.7 - 4.0 K/uL   Monocytes Relative 7 %   Monocytes Absolute 0.4 0.1 - 1.0 K/uL   Eosinophils Relative 2 %   Eosinophils Absolute 0.1 0.0 - 0.7 K/uL   Basophils Relative 1 %   Basophils Absolute 0.0 0.0 - 0.1 K/uL  Lipase, blood  Result Value Ref Range   Lipase 22 11 - 51 U/L  Urinalysis, Routine w reflex microscopic  Result Value Ref Range   Color, Urine YELLOW YELLOW   APPearance CLEAR CLEAR   Specific Gravity, Urine 1.005 1.005 - 1.030   pH 7.0 5.0 - 8.0   Glucose, UA NEGATIVE NEGATIVE mg/dL   Hgb urine dipstick NEGATIVE NEGATIVE   Bilirubin Urine NEGATIVE NEGATIVE   Ketones, ur NEGATIVE NEGATIVE mg/dL   Protein, ur NEGATIVE NEGATIVE mg/dL   Nitrite NEGATIVE NEGATIVE   Leukocytes, UA TRACE (A) NEGATIVE  Urinalysis,  Microscopic (reflex)  Result Value Ref Range   RBC / HPF NONE SEEN 0 - 5 RBC/hpf   WBC, UA 0-5 0 - 5 WBC/hpf   Bacteria, UA RARE (A) NONE SEEN   Squamous Epithelial / LPF 0-5 (A) NONE SEEN   Ct Abdomen Pelvis Wo Contrast  Result Date: 03/01/2016 CLINICAL DATA:  Upper abdominal pain for several days, history of metastatic renal cell carcinoma EXAM: CT ABDOMEN AND PELVIS WITHOUT CONTRAST TECHNIQUE: Multidetector CT imaging of the abdomen and pelvis was performed following the standard protocol without IV contrast. COMPARISON:  12/25/2015 FINDINGS: Lower chest: Lung bases are free of acute infiltrate or sizable effusion. A few scattered nodules are again seen similar to that noted on prior CT examination. Hepatobiliary: There is a hypodensity within the left lobe of the liver laterally best seen on image number 13 of series 3. In  retrospect this is stable from multiple previous exams. Status post cholecystectomy. No biliary dilatation. Pancreas: Unremarkable. No pancreatic ductal dilatation or surrounding inflammatory changes. Spleen: Normal in size without focal abnormality. Adrenals/Urinary Tract: Changes consistent with left nephrectomy are seen. The right kidney again demonstrates cystic change. Mild fullness of the right collecting system is seen although no true obstructive changes are noted. The bladder is partially distended. 1 cm nodule is again noted in the right adrenal gland. Stable left adrenal mass is noted measuring 3 cm in greatest dimension. Stomach/Bowel: The appendix is been surgically removed. Scattered diverticular changes noted without diverticulitis. No obstructive changes are seen. Vascular/Lymphatic: Aortic atherosclerosis. No enlarged abdominal or pelvic lymph nodes. Reproductive: Status post hysterectomy. No adnexal masses. Other: No abdominal wall hernia or abnormality. No abdominopelvic ascites. Musculoskeletal: Stable metastatic disease is noted. Compression deformity of T12 is  again seen with changes of prior vertebral augmentation. IMPRESSION: Changes of left nephrectomy. Stable pulmonary nodules. Stable bony metastatic disease. Stable adrenal lesions bilaterally. Stable hypodense lesion within the liver when compared with the most recent exam. No acute abnormality noted. Electronically Signed   By: Inez Catalina M.D.   On: 03/01/2016 11:54   Radiology No results found.  Procedures Procedures (including critical care time)  Medications Ordered in ED Medications  morphine 4 MG/ML injection 4 mg (not administered)     Initial Impression / Assessment and Plan / ED Course  I have reviewed the triage vital signs and the nursing notes.  Pertinent labs & imaging results that were available during my care of the patient were reviewed by me and considered in my medical decision making (see chart for details).  Clinical Course    10:30 AM pain improved after treatment with intravenous morphine. Declined further pain medicine presently 11:20 AM reports that headache continues. Abdomin is comfortable after treatment with intravenous morphine. IV Reglan ordered. 1 PM patient asymptomatic pain-free. 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 I discussed case with Dr.Gudena, oncologist on call. Plan will start prednisone 40 mg daily for 7 days. We'll also write prescription for [place] controlled substance reporting system queried..Dr.Gudena will arrange for patient have temporal artery biopsy next week Final Clinical Impressions(s) / ED Diagnoses  Diagnosis #1 cephalgia #2 abdominal pain Final diagnoses:  None    New Prescriptions New Prescriptions   No medications on file     Orlie Dakin, MD 03/01/16 1322

## 2016-03-01 NOTE — ED Notes (Signed)
Pt also reports currently being on abx for UTI.

## 2016-03-01 NOTE — Discharge Instructions (Signed)
Take the prednisone prescribed starting tomorrow. Take the pain medication as prescribed. Keep your scheduled appointment with Dr.Ennever in 3 days. Call Dr.Ennever's office in 2 days to let office staff know that you were here today.. Tell office staff that Dr. Winfred Leeds spoke with Dr.Gudena about your case

## 2016-03-04 ENCOUNTER — Ambulatory Visit (HOSPITAL_BASED_OUTPATIENT_CLINIC_OR_DEPARTMENT_OTHER): Payer: Medicare PPO | Admitting: Family

## 2016-03-04 ENCOUNTER — Other Ambulatory Visit (HOSPITAL_BASED_OUTPATIENT_CLINIC_OR_DEPARTMENT_OTHER): Payer: Medicare PPO

## 2016-03-04 ENCOUNTER — Ambulatory Visit (HOSPITAL_BASED_OUTPATIENT_CLINIC_OR_DEPARTMENT_OTHER): Payer: Medicare PPO

## 2016-03-04 VITALS — BP 123/59 | HR 107 | Temp 97.9°F | Resp 18 | Wt 120.8 lb

## 2016-03-04 DIAGNOSIS — C649 Malignant neoplasm of unspecified kidney, except renal pelvis: Secondary | ICD-10-CM

## 2016-03-04 DIAGNOSIS — R51 Headache: Secondary | ICD-10-CM

## 2016-03-04 DIAGNOSIS — C7951 Secondary malignant neoplasm of bone: Secondary | ICD-10-CM

## 2016-03-04 DIAGNOSIS — J329 Chronic sinusitis, unspecified: Secondary | ICD-10-CM

## 2016-03-04 DIAGNOSIS — R519 Headache, unspecified: Secondary | ICD-10-CM

## 2016-03-04 DIAGNOSIS — J0130 Acute sphenoidal sinusitis, unspecified: Secondary | ICD-10-CM

## 2016-03-04 LAB — CBC WITH DIFFERENTIAL (CANCER CENTER ONLY)
BASO#: 0 10*3/uL (ref 0.0–0.2)
BASO%: 0.6 % (ref 0.0–2.0)
EOS ABS: 0.1 10*3/uL (ref 0.0–0.5)
EOS%: 1.7 % (ref 0.0–7.0)
HCT: 37.1 % (ref 34.8–46.6)
HGB: 11.7 g/dL (ref 11.6–15.9)
LYMPH#: 0.9 10*3/uL (ref 0.9–3.3)
LYMPH%: 20 % (ref 14.0–48.0)
MCH: 31 pg (ref 26.0–34.0)
MCHC: 31.5 g/dL — ABNORMAL LOW (ref 32.0–36.0)
MCV: 98 fL (ref 81–101)
MONO#: 0.5 10*3/uL (ref 0.1–0.9)
MONO%: 9.6 % (ref 0.0–13.0)
NEUT#: 3.2 10*3/uL (ref 1.5–6.5)
NEUT%: 68.1 % (ref 39.6–80.0)
Platelets: 358 10*3/uL (ref 145–400)
RBC: 3.77 10*6/uL (ref 3.70–5.32)
RDW: 19.5 % — ABNORMAL HIGH (ref 11.1–15.7)
WBC: 4.7 10*3/uL (ref 3.9–10.0)

## 2016-03-04 LAB — COMPREHENSIVE METABOLIC PANEL
ALBUMIN: 3.4 g/dL — AB (ref 3.5–5.0)
ALK PHOS: 77 U/L (ref 40–150)
ALT: 20 U/L (ref 0–55)
AST: 22 U/L (ref 5–34)
Anion Gap: 10 mEq/L (ref 3–11)
BILIRUBIN TOTAL: 0.35 mg/dL (ref 0.20–1.20)
BUN: 14.6 mg/dL (ref 7.0–26.0)
CALCIUM: 9.4 mg/dL (ref 8.4–10.4)
CO2: 22 mEq/L (ref 22–29)
CREATININE: 1.3 mg/dL — AB (ref 0.6–1.1)
Chloride: 105 mEq/L (ref 98–109)
EGFR: 39 mL/min/{1.73_m2} — ABNORMAL LOW (ref >90)
Glucose: 80 mg/dl (ref 70–140)
Potassium: 4.1 mEq/L (ref 3.5–5.1)
Sodium: 137 mEq/L (ref 136–145)
Total Protein: 7 g/dL (ref 6.4–8.3)

## 2016-03-04 LAB — LACTATE DEHYDROGENASE: LDH: 286 U/L — ABNORMAL HIGH (ref 125–245)

## 2016-03-04 MED ORDER — CEFDINIR 300 MG PO CAPS: 300.0000 mg | ORAL_CAPSULE | Freq: Two times a day (BID) | ORAL | 0 refills | Status: DC

## 2016-03-04 MED ORDER — DEXTROSE 5 % IV SOLN
2.0000 g | Freq: Once | INTRAVENOUS | Status: AC
  Administered 2016-03-04: 2 g via INTRAVENOUS
  Filled 2016-03-04: qty 2

## 2016-03-04 MED ORDER — DENOSUMAB 120 MG/1.7ML ~~LOC~~ SOLN
120.0000 mg | Freq: Once | SUBCUTANEOUS | Status: AC
  Administered 2016-03-04: 120 mg via SUBCUTANEOUS
  Filled 2016-03-04: qty 1.7

## 2016-03-04 MED ORDER — PREDNISONE 10 MG PO TABS: 10.0000 mg | ORAL_TABLET | Freq: Every day | ORAL | 1 refills | Status: DC

## 2016-03-04 MED ORDER — SODIUM CHLORIDE 0.9 % IV SOLN
20.0000 mg | Freq: Once | INTRAVENOUS | Status: AC
  Administered 2016-03-04: 20 mg via INTRAVENOUS
  Filled 2016-03-04: qty 2

## 2016-03-04 NOTE — Patient Instructions (Signed)
Denosumab injection What is this medicine? DENOSUMAB (den oh sue mab) slows bone breakdown. Prolia is used to treat osteoporosis in women after menopause and in men. Xgeva is used to prevent bone fractures and other bone problems caused by cancer bone metastases. Xgeva is also used to treat giant cell tumor of the bone. This medicine may be used for other purposes; ask your health care provider or pharmacist if you have questions. What should I tell my health care provider before I take this medicine? They need to know if you have any of these conditions: -dental disease -eczema -infection or history of infections -kidney disease or on dialysis -low blood calcium or vitamin D -malabsorption syndrome -scheduled to have surgery or tooth extraction -taking medicine that contains denosumab -thyroid or parathyroid disease -an unusual reaction to denosumab, other medicines, foods, dyes, or preservatives -pregnant or trying to get pregnant -breast-feeding How should I use this medicine? This medicine is for injection under the skin. It is given by a health care professional in a hospital or clinic setting. If you are getting Prolia, a special MedGuide will be given to you by the pharmacist with each prescription and refill. Be sure to read this information carefully each time. For Prolia, talk to your pediatrician regarding the use of this medicine in children. Special care may be needed. For Xgeva, talk to your pediatrician regarding the use of this medicine in children. While this drug may be prescribed for children as young as 13 years for selected conditions, precautions do apply. Overdosage: If you think you have taken too much of this medicine contact a poison control center or emergency room at once. NOTE: This medicine is only for you. Do not share this medicine with others. What if I miss a dose? It is important not to miss your dose. Call your doctor or health care professional if you are  unable to keep an appointment. What may interact with this medicine? Do not take this medicine with any of the following medications: -other medicines containing denosumab This medicine may also interact with the following medications: -medicines that suppress the immune system -medicines that treat cancer -steroid medicines like prednisone or cortisone This list may not describe all possible interactions. Give your health care provider a list of all the medicines, herbs, non-prescription drugs, or dietary supplements you use. Also tell them if you smoke, drink alcohol, or use illegal drugs. Some items may interact with your medicine. What should I watch for while using this medicine? Visit your doctor or health care professional for regular checks on your progress. Your doctor or health care professional may order blood tests and other tests to see how you are doing. Call your doctor or health care professional if you get a cold or other infection while receiving this medicine. Do not treat yourself. This medicine may decrease your body's ability to fight infection. You should make sure you get enough calcium and vitamin D while you are taking this medicine, unless your doctor tells you not to. Discuss the foods you eat and the vitamins you take with your health care professional. See your dentist regularly. Brush and floss your teeth as directed. Before you have any dental work done, tell your dentist you are receiving this medicine. Do not become pregnant while taking this medicine or for 5 months after stopping it. Women should inform their doctor if they wish to become pregnant or think they might be pregnant. There is a potential for serious side effects   to an unborn child. Talk to your health care professional or pharmacist for more information. What side effects may I notice from receiving this medicine? Side effects that you should report to your doctor or health care professional as soon as  possible: -allergic reactions like skin rash, itching or hives, swelling of the face, lips, or tongue -breathing problems -chest pain -fast, irregular heartbeat -feeling faint or lightheaded, falls -fever, chills, or any other sign of infection -muscle spasms, tightening, or twitches -numbness or tingling -skin blisters or bumps, or is dry, peels, or red -slow healing or unexplained pain in the mouth or jaw -unusual bleeding or bruising Side effects that usually do not require medical attention (Report these to your doctor or health care professional if they continue or are bothersome.): -muscle pain -stomach upset, gas This list may not describe all possible side effects. Call your doctor for medical advice about side effects. You may report side effects to FDA at 1-800-FDA-1088. Where should I keep my medicine? This medicine is only given in a clinic, doctor's office, or other health care setting and will not be stored at home. NOTE: This sheet is a summary. It may not cover all possible information. If you have questions about this medicine, talk to your doctor, pharmacist, or health care provider.    2016, Elsevier/Gold Standard. (2011-08-11 12:37:47) Influenza Virus Vaccine (Flucelvax) What is this medicine? INFLUENZA VIRUS VACCINE (in floo EN zuh VAHY ruhs vak SEEN) helps to reduce the risk of getting influenza also known as the flu. The vaccine only helps protect you against some strains of the flu. This medicine may be used for other purposes; ask your health care provider or pharmacist if you have questions. What should I tell my health care provider before I take this medicine? They need to know if you have any of these conditions: -bleeding disorder like hemophilia -fever or infection -Guillain-Barre syndrome or other neurological problems -immune system problems -infection with the human immunodeficiency virus (HIV) or AIDS -low blood platelet counts -multiple  sclerosis -an unusual or allergic reaction to influenza virus vaccine, other medicines, foods, dyes or preservatives -pregnant or trying to get pregnant -breast-feeding How should I use this medicine? This vaccine is for injection into a muscle. It is given by a health care professional. A copy of Vaccine Information Statements will be given before each vaccination. Read this sheet carefully each time. The sheet may change frequently. Talk to your pediatrician regarding the use of this medicine in children. Special care may be needed. Overdosage: If you think you've taken too much of this medicine contact a poison control center or emergency room at once. Overdosage: If you think you have taken too much of this medicine contact a poison control center or emergency room at once. NOTE: This medicine is only for you. Do not share this medicine with others. What if I miss a dose? This does not apply. What may interact with this medicine? -chemotherapy or radiation therapy -medicines that lower your immune system like etanercept, anakinra, infliximab, and adalimumab -medicines that treat or prevent blood clots like warfarin -phenytoin -steroid medicines like prednisone or cortisone -theophylline -vaccines This list may not describe all possible interactions. Give your health care provider a list of all the medicines, herbs, non-prescription drugs, or dietary supplements you use. Also tell them if you smoke, drink alcohol, or use illegal drugs. Some items may interact with your medicine. What should I watch for while using this medicine? Report any side effects   that do not go away within 3 days to your doctor or health care professional. Call your health care provider if any unusual symptoms occur within 6 weeks of receiving this vaccine. You may still catch the flu, but the illness is not usually as bad. You cannot get the flu from the vaccine. The vaccine will not protect against colds or other  illnesses that may cause fever. The vaccine is needed every year. What side effects may I notice from receiving this medicine? Side effects that you should report to your doctor or health care professional as soon as possible: -allergic reactions like skin rash, itching or hives, swelling of the face, lips, or tongue Side effects that usually do not require medical attention (Report these to your doctor or health care professional if they continue or are bothersome.): -fever -headache -muscle aches and pains -pain, tenderness, redness, or swelling at the injection site -tiredness This list may not describe all possible side effects. Call your doctor for medical advice about side effects. You may report side effects to FDA at 1-800-FDA-1088. Where should I keep my medicine? The vaccine will be given by a health care professional in a clinic, pharmacy, doctor's office, or other health care setting. You will not be given vaccine doses to store at home. NOTE: This sheet is a summary. It may not cover all possible information. If you have questions about this medicine, talk to your doctor, pharmacist, or health care provider.    2016, Elsevier/Gold Standard. (2011-01-22 14:06:47)  

## 2016-03-04 NOTE — Progress Notes (Signed)
Hematology and Oncology Follow Up Visit  Cathy Lewis [id number] 05-01-1945 71 y.o. 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 She is not feeling well. She has had a bad headache for 3 weeks now. She initially tried She states that her pain level is a 5 at this time. The pain is behind her right eye and into the right temple. She was seen in the ED on 1/6 and was treated with pain medication. Her sed rate at that time was elevated at 85. CT scan showed inflammatory changes in sphenoid sinus. CT of the abdomen and pelvis showed stable metastatic disease.  She tried taking sumatriptan for her headache with no relief.  She has not yet started her prednisone prescribed by the ED.  No fever, chills, n/v, cough, rash, dizziness, vision changes, SOB, chest pain, palpitations or changes in bowel or bladder habits.  No syncope or falls. No lymphadenopathy found on exam.  No episodes of bleeding or bruising.  No swelling, tenderness, numbness or tingling in her extremities.  No lymphadenopathy found on exam. No episodes of bleeding or bruising.   Medications:  Allergies as of 03/04/2016      Reactions   Azithromycin Other (See Comments)   Stomach cramps.  Green black stool    Ciprofloxacin Other (See Comments)   Body aches Chills Heart races   Etodolac Palpitations, Other (See Comments)   Joints hurt   Gnp [acetaminophen] Other (See Comments)   "GRN" (cough syrup) Heart races   Moxifloxacin Palpitations, Other (See Comments)   Body ache   Tramadol Nausea And Vomiting, Other (See Comments)   Dizziness   Rofecoxib Other (See  Comments)   Pt unsure       Medication List       Accurate as of 03/04/16 10:55 AM. Always use your most recent med list.          ADVAIR DISKUS 100-50 MCG/DOSE Aepb Generic drug:  Fluticasone-Salmeterol Inhale 1 puff into the lungs 2 (two) times daily before a meal.   albuterol 90 MCG/ACT inhaler Commonly known as:  PROVENTIL,VENTOLIN Inhale 2 puffs into the lungs every 4 (four) hours as needed for wheezing.   amLODipine 5 MG tablet Commonly known as:  NORVASC Take 5 mg by mouth daily.   cabozantinib S-Malate 40 MG Tabs Commonly known as:  CABOMETYX Take 40 mg by mouth daily.   CALCIUM + D PO Take 1 tablet by mouth daily.   colchicine 0.6 MG tablet Take 1 tablet (0.6 mg total) by mouth daily.   dicyclomine 20 MG tablet Commonly known as:  BENTYL Take 20 mg by mouth 3 (three) times daily as needed (Stomach Cramps).   fentaNYL 12 MCG/HR Commonly known as:  DURAGESIC - dosed mcg/hr Place 2 patches (25 mcg total) onto the skin every 3 (three) days.   HYDROcodone-acetaminophen 5-325 MG tablet Commonly known as:  NORCO Take 1 tablet by mouth every 6 (six) hours as needed.   LORazepam 0.5 MG tablet Commonly known as:  ATIVAN Take 0.5 mg by mouth every 4 (four) hours as needed for anxiety.  Magnesium 250 MG Tabs Take 250 mg by mouth daily.   meclizine 25 MG tablet Commonly known as:  ANTIVERT Take 12.5 mg by mouth 3 (three) times daily as needed. For dizziness.   metoCLOPramide 10 MG tablet Commonly known as:  REGLAN Take 1 tablet (10 mg total) by mouth every 6 (six) hours as needed for nausea.   montelukast 10 MG tablet Commonly known as:  SINGULAIR Take 10 mg by mouth at bedtime.   nystatin 100000 UNIT/ML suspension Commonly known as:  MYCOSTATIN Take 5 mLs (500,000 Units total) by mouth 4 (four) times daily.   OSTEO BI-FLEX ADV DOUBLE ST PO Take 1 tablet by mouth daily.   pilocarpine 5 MG tablet Commonly known as:  SALAGEN Take 1 tablet (5 mg  total) by mouth 2 (two) times daily.   predniSONE 20 MG tablet Commonly known as:  DELTASONE 2 tablets daily starting 03/02/2016   PROBIOTIC DAILY PO Take 1 tablet by mouth daily.   ranitidine 150 MG tablet Commonly known as:  ZANTAC Take 150 mg by mouth 2 (two) times daily.   vitamin C 500 MG tablet Commonly known as:  ASCORBIC ACID Take 500 mg by mouth daily.   XGEVA 120 MG/1.7ML Soln injection Generic drug:  denosumab Inject 120 mg into the skin every 30 (thirty) days.       Allergies:  Allergies  Allergen Reactions  . Azithromycin Other (See Comments)    Stomach cramps.  Green black stool   . Ciprofloxacin Other (See Comments)    Body aches Chills Heart races  . Etodolac Palpitations and Other (See Comments)    Joints hurt   . Gnp [Acetaminophen] Other (See Comments)    "GRN" (cough syrup) Heart races  . Moxifloxacin Palpitations and Other (See Comments)    Body ache  . Tramadol Nausea And Vomiting and Other (See Comments)    Dizziness  . Rofecoxib Other (See Comments)    Pt unsure     Past Medical History, Surgical history, Social history, and Family History were reviewed and updated.  Review of Systems: All other 10 point review of systems is negative.   Physical Exam:  vitals were not taken for this visit.  Wt Readings from Last 3 Encounters:  03/01/16 120 lb (54.4 kg)  02/01/16 121 lb 3.2 oz (55 kg)  12/28/15 117 lb 12.8 oz (53.4 kg)    Ocular: Sclerae unicteric, pupils equal, round and reactive to light Ear-nose-throat: Oropharynx clear, dentition fair Lymphatic: No cervical supraclavicular or axillary adenopathy Lungs no rales or rhonchi, good excursion bilaterally Heart regular rate and rhythm, no murmur appreciated Abd soft, nontender, positive bowel sounds, no liver or spleen tip palpated on exam, no fluid wave  MSK Chronic back/right shoulder pain and tenderness due to metastatic disease, no joint edema Neuro: non-focal,  well-oriented, appropriate affect Breasts: Deferred  Lab Results  Component Value Date   WBC 4.7 03/04/2016   HGB 11.7 03/04/2016   HCT 37.1 03/04/2016   MCV 98 03/04/2016   PLT 358 03/04/2016   No results found for: FERRITIN, IRON, TIBC, UIBC, IRONPCTSAT Lab Results  Component Value Date   RBC 3.77 03/04/2016   No results found for: KPAFRELGTCHN, LAMBDASER, KAPLAMBRATIO No results found for: IGGSERUM, IGA, IGMSERUM No results found for: Odetta Pink, SPEI   Chemistry      Component Value Date/Time   NA 137 03/01/2016 0944   NA 140 02/01/2016 1124   NA 140 12/28/2015  1352   K 4.0 03/01/2016 0944   K 4.1 02/01/2016 1124   K 4.4 12/28/2015 1352   CL 106 03/01/2016 0944   CL 103 02/01/2016 1124   CO2 23 03/01/2016 0944   CO2 25 02/01/2016 1124   CO2 27 12/28/2015 1352   BUN 14 03/01/2016 0944   BUN 14 02/01/2016 1124   BUN 21.9 12/28/2015 1352   CREATININE 1.24 (H) 03/01/2016 0944   CREATININE 1.3 (H) 02/01/2016 1124   CREATININE 1.2 (H) 12/28/2015 1352   GLU 104 07/20/2008      Component Value Date/Time   CALCIUM 9.2 03/01/2016 0944   CALCIUM 9.4 02/01/2016 1124   CALCIUM 9.4 12/28/2015 1352   ALKPHOS 64 03/01/2016 0944   ALKPHOS 64 02/01/2016 1124   ALKPHOS 78 12/28/2015 1352   AST 24 03/01/2016 0944   AST 37 02/01/2016 1124   AST 28 12/28/2015 1352   ALT 21 03/01/2016 0944   ALT 37 02/01/2016 1124   ALT 29 12/28/2015 1352   BILITOT 0.3 03/01/2016 0944   BILITOT 0.50 02/01/2016 1124   BILITOT 0.30 12/28/2015 1352     Impression and Plan: Ms. Snowman is a very pleasant 71 yo white female with metastatic clear cell renal carcinoma diagnosed in 2013. She is having a nice response on Cabometyx and verbalized that she is taking 40 mg PO every other day as prescribed.  She has now had a headache/sinusitis for 3 weeks with no relief from OTC medications or sumatriptan.  We will give her Rocephin 2 gm IV today  as well as Decadron 20 mg IV today while she is here in the office.  She has not started prednisone. She states that she took a break from the steroid last week due to blurry vision which resolved when she stopped. We will have her we start low dose Prednisone 10 mg PO daily as well as Omnicef 300 mg BID for 6 days.  We will schedule her for an MRI of the brain later this week to better evaluate for cause. CT did confirm inflammatory changes in sphenoid sinus and she had a sed rate of 85.  I spoke with [name] and we will hold off on a temporal artery biopsy for now and see if she improves with treatment with steroids and antibiotics first.  We will plan to see her back in 1 week for repeat labs and follow-up to see how she is feeling.  She will contact our office with any questions or concerns. We can certainly see her sooner if need be.   Eliezer Bottom, NP 1/9/201810:55 AM

## 2016-03-08 ENCOUNTER — Ambulatory Visit (HOSPITAL_BASED_OUTPATIENT_CLINIC_OR_DEPARTMENT_OTHER)
Admission: RE | Admit: 2016-03-08 | Discharge: 2016-03-08 | Disposition: A | Discharge status: Home / Self Care | Payer: Medicare PPO | Source: Ambulatory Visit | Attending: Family | Admitting: Family

## 2016-03-08 DIAGNOSIS — C649 Malignant neoplasm of unspecified kidney, except renal pelvis: Secondary | ICD-10-CM

## 2016-03-08 DIAGNOSIS — C7951 Secondary malignant neoplasm of bone: Secondary | ICD-10-CM | POA: Diagnosis present

## 2016-03-08 DIAGNOSIS — J0130 Acute sphenoidal sinusitis, unspecified: Secondary | ICD-10-CM | POA: Diagnosis present

## 2016-03-08 DIAGNOSIS — R51 Headache: Secondary | ICD-10-CM | POA: Insufficient documentation

## 2016-03-08 DIAGNOSIS — R519 Headache, unspecified: Secondary | ICD-10-CM

## 2016-03-10 ENCOUNTER — Ambulatory Visit (HOSPITAL_BASED_OUTPATIENT_CLINIC_OR_DEPARTMENT_OTHER): Payer: Medicare PPO | Admitting: Family

## 2016-03-10 ENCOUNTER — Other Ambulatory Visit (HOSPITAL_BASED_OUTPATIENT_CLINIC_OR_DEPARTMENT_OTHER): Payer: Medicare PPO

## 2016-03-10 VITALS — BP 120/71 | HR 102 | Temp 98.4°F | Wt 119.1 lb

## 2016-03-10 DIAGNOSIS — C649 Malignant neoplasm of unspecified kidney, except renal pelvis: Secondary | ICD-10-CM

## 2016-03-10 DIAGNOSIS — C7951 Secondary malignant neoplasm of bone: Secondary | ICD-10-CM

## 2016-03-10 DIAGNOSIS — G43909 Migraine, unspecified, not intractable, without status migrainosus: Secondary | ICD-10-CM

## 2016-03-10 DIAGNOSIS — G43719 Chronic migraine without aura, intractable, without status migrainosus: Secondary | ICD-10-CM

## 2016-03-10 DIAGNOSIS — R519 Headache, unspecified: Secondary | ICD-10-CM

## 2016-03-10 DIAGNOSIS — R51 Headache: Secondary | ICD-10-CM

## 2016-03-10 DIAGNOSIS — J0130 Acute sphenoidal sinusitis, unspecified: Secondary | ICD-10-CM

## 2016-03-10 LAB — CBC WITH DIFFERENTIAL (CANCER CENTER ONLY)
BASO#: 0 10*3/uL (ref 0.0–0.2)
BASO%: 0.3 % (ref 0.0–2.0)
EOS%: 0.6 % (ref 0.0–7.0)
Eosinophils Absolute: 0 10*3/uL (ref 0.0–0.5)
HEMATOCRIT: 37.5 % (ref 34.8–46.6)
HGB: 12 g/dL (ref 11.6–15.9)
LYMPH#: 0.9 10*3/uL (ref 0.9–3.3)
LYMPH%: 14.2 % (ref 14.0–48.0)
MCH: 31.3 pg (ref 26.0–34.0)
MCHC: 32 g/dL (ref 32.0–36.0)
MCV: 98 fL (ref 81–101)
MONO#: 0.6 10*3/uL (ref 0.1–0.9)
MONO%: 9.3 % (ref 0.0–13.0)
NEUT#: 4.9 10*3/uL (ref 1.5–6.5)
NEUT%: 75.6 % (ref 39.6–80.0)
PLATELETS: 298 10*3/uL (ref 145–400)
RBC: 3.83 10*6/uL (ref 3.70–5.32)
RDW: 18.9 % — AB (ref 11.1–15.7)
WBC: 6.5 10*3/uL (ref 3.9–10.0)

## 2016-03-10 LAB — CMP (CANCER CENTER ONLY)
ALT(SGPT): 28 U/L (ref 10–47)
AST: 28 U/L (ref 11–38)
Albumin: 3.4 g/dL (ref 3.3–5.5)
Alkaline Phosphatase: 66 U/L (ref 26–84)
BILIRUBIN TOTAL: 0.6 mg/dL (ref 0.20–1.60)
BUN: 17 mg/dL (ref 7–22)
CALCIUM: 9.8 mg/dL (ref 8.0–10.3)
CO2: 26 meq/L (ref 18–33)
Chloride: 102 mEq/L (ref 98–108)
Creat: 1.2 mg/dl (ref 0.6–1.2)
GLUCOSE: 98 mg/dL (ref 73–118)
POTASSIUM: 4.3 meq/L (ref 3.3–4.7)
Sodium: 140 mEq/L (ref 128–145)
Total Protein: 6.8 g/dL (ref 6.4–8.1)

## 2016-03-10 LAB — TECHNOLOGIST REVIEW CHCC SATELLITE: Tech Review: 1

## 2016-03-10 MED ORDER — TOPIRAMATE 25 MG PO TABS: 25.0000 mg | ORAL_TABLET | Freq: Every day | ORAL | 0 refills | Status: DC

## 2016-03-10 MED ORDER — TOPIRAMATE 25 MG PO TABS: 25.0000 mg | ORAL_TABLET | Freq: Two times a day (BID) | ORAL | 0 refills | Status: DC

## 2016-03-10 NOTE — Progress Notes (Signed)
Hematology and Oncology Follow Up Visit  Cathy Lewis [id number] 1945/10/27 71 y.o. 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 She still has the migraine behind her right eye and pain radiating in the right temple. She has no vision changes, dizziness, n/v, or balance problems. She has noticed that smells like perfume, cologne and fabric softener will trigger it. She does not feel that the prednisone has helped.  She still has a couple days left on her antibiotic for sinus infection and will finish this.  Wearing reading glasses has helped ease the pain but not resolve it. Her MRI of the brain last week showed no changes with white matter disease and no acute intracranial abnormality.  She states that she took off her Duragesic patch a few days before Christmas and did not put on another until this past Wednesday. She states this may have been what triggered the migraine to start on Christmas day.  No fatigue, fever, chills,cough, rash, SOB, chest pain, palpitations or changes in bowel or bladder habits.  No syncope or falls. No lymphadenopathy found on exam.  No episodes of bleeding or bruising.  No swelling, numbness or tingling in her extremities. When standing for an extended period of time she will sometimes get left sciatic pain in her buttock. This resolves with sitting down to rest.  She has maintained a good appetite and is staying well hydrated. Her weight is stable.   Medications:  Allergies as of 03/10/2016      Reactions   Azithromycin Other (See Comments)   Stomach cramps.  Green black stool    Ciprofloxacin  Other (See Comments)   Body aches Chills Heart races   Etodolac Palpitations, Other (See Comments)   Joints hurt   Gnp [acetaminophen] Other (See Comments)   "GRN" (cough syrup) Heart races   Moxifloxacin Palpitations, Other (See Comments)   Body ache   Tramadol Nausea And Vomiting, Other (See Comments)   Dizziness   Rofecoxib Other (See Comments)   Pt unsure       Medication List       Accurate as of 03/10/16 10:30 AM. Always use your most recent med list.          ADVAIR DISKUS 100-50 MCG/DOSE Aepb Generic drug:  Fluticasone-Salmeterol Inhale 1 puff into the lungs 2 (two) times daily before a meal.   albuterol 90 MCG/ACT inhaler Commonly known as:  PROVENTIL,VENTOLIN Inhale 2 puffs into the lungs every 4 (four) hours as needed for wheezing.   amLODipine 5 MG tablet Commonly known as:  NORVASC Take 5 mg by mouth daily.   cabozantinib S-Malate 40 MG Tabs Commonly known as:  CABOMETYX Take 40 mg by mouth daily.   CALCIUM + D PO Take 1 tablet by mouth daily.   cefdinir 300 MG capsule Commonly known as:  OMNICEF Take 1 capsule (300 mg total) by mouth 2 (two) times daily.   colchicine 0.6 MG tablet Take 1 tablet (0.6 mg total) by mouth daily.   dicyclomine 20 MG tablet Commonly known as:  BENTYL Take 20 mg by mouth 3 (  three) times daily as needed (Stomach Cramps).   fentaNYL 12 MCG/HR Commonly known as:  DURAGESIC - dosed mcg/hr Place 2 patches (25 mcg total) onto the skin every 3 (three) days.   HYDROcodone-acetaminophen 5-325 MG tablet Commonly known as:  NORCO Take 1 tablet by mouth every 6 (six) hours as needed.   LORazepam 0.5 MG tablet Commonly known as:  ATIVAN Take 0.5 mg by mouth every 4 (four) hours as needed for anxiety.   Magnesium 250 MG Tabs Take 250 mg by mouth daily.   meclizine 25 MG tablet Commonly known as:  ANTIVERT Take 12.5 mg by mouth 3 (three) times daily as needed. For dizziness.   metoCLOPramide 10 MG tablet Commonly  known as:  REGLAN Take 1 tablet (10 mg total) by mouth every 6 (six) hours as needed for nausea.   montelukast 10 MG tablet Commonly known as:  SINGULAIR Take 10 mg by mouth at bedtime.   nystatin 100000 UNIT/ML suspension Commonly known as:  MYCOSTATIN Take 5 mLs (500,000 Units total) by mouth 4 (four) times daily.   OSTEO BI-FLEX ADV DOUBLE ST PO Take 1 tablet by mouth daily.   pilocarpine 5 MG tablet Commonly known as:  SALAGEN Take 1 tablet (5 mg total) by mouth 2 (two) times daily.   predniSONE 10 MG tablet Commonly known as:  DELTASONE Take 1 tablet (10 mg total) by mouth daily with breakfast.   PROBIOTIC DAILY PO Take 1 tablet by mouth daily.   ranitidine 150 MG tablet Commonly known as:  ZANTAC Take 150 mg by mouth 2 (two) times daily.   vitamin C 500 MG tablet Commonly known as:  ASCORBIC ACID Take 500 mg by mouth daily.   XGEVA 120 MG/1.7ML Soln injection Generic drug:  denosumab Inject 120 mg into the skin every 30 (thirty) days.       Allergies:  Allergies  Allergen Reactions  . Azithromycin Other (See Comments)    Stomach cramps.  Green black stool   . Ciprofloxacin Other (See Comments)    Body aches Chills Heart races  . Etodolac Palpitations and Other (See Comments)    Joints hurt   . Gnp [Acetaminophen] Other (See Comments)    "GRN" (cough syrup) Heart races  . Moxifloxacin Palpitations and Other (See Comments)    Body ache  . Tramadol Nausea And Vomiting and Other (See Comments)    Dizziness  . Rofecoxib Other (See Comments)    Pt unsure     Past Medical History, Surgical history, Social history, and Family History were reviewed and updated.  Review of Systems: All other 10 point review of systems is negative.   Physical Exam:  weight is 119 lb 1 oz (54 kg). Her oral temperature is 98.4 F (36.9 C). Her blood pressure is 120/71 and her pulse is 102 (abnormal).   Wt Readings from Last 3 Encounters:  03/10/16 119 lb 1 oz (54 kg)   03/04/16 120 lb 12 oz (54.8 kg)  03/01/16 120 lb (54.4 kg)    Ocular: Sclerae unicteric, pupils equal, round and reactive to light Ear-nose-throat: Oropharynx clear, dentition fair Lymphatic: No cervical supraclavicular or axillary adenopathy Lungs no rales or rhonchi, good excursion bilaterally Heart regular rate and rhythm, no murmur appreciated Abd soft, nontender, positive bowel sounds, no liver or spleen tip palpated on exam, no fluid wave  MSK Chronic back/right shoulder pain and tenderness due to metastatic disease, no joint edema Neuro: non-focal, well-oriented, appropriate affect Breasts: Deferred  Lab Results  Component Value Date   WBC 6.5 03/10/2016   HGB 12.0 03/10/2016   HCT 37.5 03/10/2016   MCV 98 03/10/2016   PLT 298 03/10/2016   No results found for: FERRITIN, IRON, TIBC, UIBC, IRONPCTSAT Lab Results  Component Value Date   RBC 3.83 03/10/2016   No results found for: KPAFRELGTCHN, LAMBDASER, KAPLAMBRATIO No results found for: IGGSERUM, IGA, IGMSERUM No results found for: Odetta Pink, SPEI   Chemistry      Component Value Date/Time   NA 137 03/04/2016 1043   K 4.1 03/04/2016 1043   CL 106 03/01/2016 0944   CL 103 02/01/2016 1124   CO2 22 03/04/2016 1043   BUN 14.6 03/04/2016 1043   CREATININE 1.3 (H) 03/04/2016 1043   GLU 104 07/20/2008      Component Value Date/Time   CALCIUM 9.4 03/04/2016 1043   ALKPHOS 77 03/04/2016 1043   AST 22 03/04/2016 1043   ALT 20 03/04/2016 1043   BILITOT 0.35 03/04/2016 1043     Impression and Plan: Ms. Nine is a very pleasant 71 yo white female with metastatic clear cell renal carcinoma diagnosed in 2013. She is having a nice response on Cabometyx and verbalized that she is taking 40 mg PO every other day as prescribed. Most recent CT scan 2 weeks ago showed stable disease.  She still has a persistent migraine that has settled behind the right eye and  radiating into the temple. This has not resolved with sumatriptan or prednisone.  MRI was negative for acute abnormality.  We will have her stop the prednisone. She will finish her antibiotic for the resolving sinusitis.  We will have her start Topamax 25 mg PO daily for the migraine and after 1 week she can increase to 25 mg PO BID if needed. We will keep her at a reduced dose due to the fact that she only has the one kidney.  We will plan to see her back in 3 weeks for repeat labs and follow-up to see how she is feeling.  She will contact our office with any questions or concerns. We can certainly see her sooner if need be.   Eliezer Bottom, NP 1/15/201810:30 AM

## 2016-03-11 LAB — SEDIMENTATION RATE: Sedimentation Rate-Westergren: 16 mm/hr (ref 0–40)

## 2016-03-17 DIAGNOSIS — H524 Presbyopia: Secondary | ICD-10-CM | POA: Insufficient documentation

## 2016-03-19 ENCOUNTER — Telehealth: Payer: Self-pay | Admitting: *Deleted

## 2016-03-19 NOTE — Telephone Encounter (Signed)
Patient wants to know if she should continue taking the Topamax. She is currently taking 2 pills a day and is no longer suffering from headaches. Spoke with Laverna Peace NP and she wants the patient to decrease to one pill per day for 7 days and if patient remains headache free, to then stop the medication. Patient understands this. Verified with teach back.

## 2016-03-31 ENCOUNTER — Other Ambulatory Visit (HOSPITAL_BASED_OUTPATIENT_CLINIC_OR_DEPARTMENT_OTHER): Payer: Medicare PPO

## 2016-03-31 ENCOUNTER — Other Ambulatory Visit: Payer: Self-pay | Admitting: Hematology & Oncology

## 2016-03-31 ENCOUNTER — Ambulatory Visit (HOSPITAL_BASED_OUTPATIENT_CLINIC_OR_DEPARTMENT_OTHER): Payer: Medicare PPO | Admitting: Family

## 2016-03-31 ENCOUNTER — Ambulatory Visit (HOSPITAL_BASED_OUTPATIENT_CLINIC_OR_DEPARTMENT_OTHER): Payer: Medicare PPO

## 2016-03-31 DIAGNOSIS — C649 Malignant neoplasm of unspecified kidney, except renal pelvis: Secondary | ICD-10-CM

## 2016-03-31 DIAGNOSIS — R51 Headache: Secondary | ICD-10-CM

## 2016-03-31 DIAGNOSIS — R630 Anorexia: Secondary | ICD-10-CM | POA: Diagnosis not present

## 2016-03-31 DIAGNOSIS — C7951 Secondary malignant neoplasm of bone: Secondary | ICD-10-CM | POA: Diagnosis not present

## 2016-03-31 DIAGNOSIS — R110 Nausea: Secondary | ICD-10-CM

## 2016-03-31 DIAGNOSIS — R519 Headache, unspecified: Secondary | ICD-10-CM

## 2016-03-31 DIAGNOSIS — J0130 Acute sphenoidal sinusitis, unspecified: Secondary | ICD-10-CM

## 2016-03-31 DIAGNOSIS — G43719 Chronic migraine without aura, intractable, without status migrainosus: Secondary | ICD-10-CM

## 2016-03-31 LAB — CBC WITH DIFFERENTIAL (CANCER CENTER ONLY)
BASO#: 0 10*3/uL (ref 0.0–0.2)
BASO%: 0.7 % (ref 0.0–2.0)
EOS ABS: 0.2 10*3/uL (ref 0.0–0.5)
EOS%: 3.4 % (ref 0.0–7.0)
HCT: 37.6 % (ref 34.8–46.6)
HGB: 12.2 g/dL (ref 11.6–15.9)
LYMPH#: 1.8 10*3/uL (ref 0.9–3.3)
LYMPH%: 41.3 % (ref 14.0–48.0)
MCH: 31.2 pg (ref 26.0–34.0)
MCHC: 32.4 g/dL (ref 32.0–36.0)
MCV: 96 fL (ref 81–101)
MONO#: 0.5 10*3/uL (ref 0.1–0.9)
MONO%: 10.7 % (ref 0.0–13.0)
NEUT#: 1.9 10*3/uL (ref 1.5–6.5)
NEUT%: 43.9 % (ref 39.6–80.0)
PLATELETS: 188 10*3/uL (ref 145–400)
RBC: 3.91 10*6/uL (ref 3.70–5.32)
RDW: 18.3 % — ABNORMAL HIGH (ref 11.1–15.7)
WBC: 4.4 10*3/uL (ref 3.9–10.0)

## 2016-03-31 LAB — CMP (CANCER CENTER ONLY)
ALT(SGPT): 31 U/L (ref 10–47)
AST: 36 U/L (ref 11–38)
Albumin: 3.6 g/dL (ref 3.3–5.5)
Alkaline Phosphatase: 53 U/L (ref 26–84)
BUN: 13 mg/dL (ref 7–22)
CHLORIDE: 104 meq/L (ref 98–108)
CO2: 27 mEq/L (ref 18–33)
Calcium: 9.5 mg/dL (ref 8.0–10.3)
Creat: 1.4 mg/dl — ABNORMAL HIGH (ref 0.6–1.2)
Glucose, Bld: 83 mg/dL (ref 73–118)
POTASSIUM: 4 meq/L (ref 3.3–4.7)
Sodium: 142 mEq/L (ref 128–145)
TOTAL PROTEIN: 6.5 g/dL (ref 6.4–8.1)
Total Bilirubin: 0.6 mg/dl (ref 0.20–1.60)

## 2016-03-31 MED ORDER — PREDNISONE 5 MG PO TABS: 5.0000 mg | ORAL_TABLET | Freq: Every day | ORAL | 2 refills | Status: DC

## 2016-03-31 MED ORDER — DENOSUMAB 120 MG/1.7ML ~~LOC~~ SOLN
120.0000 mg | Freq: Once | SUBCUTANEOUS | Status: AC
Start: 2016-03-31 — End: 2016-03-31
  Administered 2016-03-31: 120 mg via SUBCUTANEOUS
  Filled 2016-03-31: qty 1.7

## 2016-03-31 MED FILL — CABOMETYX 40 MG TABLET: 40 | 30 days supply | Qty: 30 | Fill #0

## 2016-03-31 NOTE — Patient Instructions (Signed)
Denosumab injection What is this medicine? DENOSUMAB (den oh sue mab) slows bone breakdown. Prolia is used to treat osteoporosis in women after menopause and in men. Xgeva is used to prevent bone fractures and other bone problems caused by cancer bone metastases. Xgeva is also used to treat giant cell tumor of the bone. COMMON BRAND NAME(S): Prolia, XGEVA What should I tell my health care provider before I take this medicine? They need to know if you have any of these conditions: -dental disease -eczema -infection or history of infections -kidney disease or on dialysis -low blood calcium or vitamin D -malabsorption syndrome -scheduled to have surgery or tooth extraction -taking medicine that contains denosumab -thyroid or parathyroid disease -an unusual reaction to denosumab, other medicines, foods, dyes, or preservatives -pregnant or trying to get pregnant -breast-feeding How should I use this medicine? This medicine is for injection under the skin. It is given by a health care professional in a hospital or clinic setting. If you are getting Prolia, a special MedGuide will be given to you by the pharmacist with each prescription and refill. Be sure to read this information carefully each time. For Prolia, talk to your pediatrician regarding the use of this medicine in children. Special care may be needed. For Xgeva, talk to your pediatrician regarding the use of this medicine in children. While this drug may be prescribed for children as young as 13 years for selected conditions, precautions do apply. What if I miss a dose? It is important not to miss your dose. Call your doctor or health care professional if you are unable to keep an appointment. What may interact with this medicine? Do not take this medicine with any of the following medications: -other medicines containing denosumab This medicine may also interact with the following medications: -medicines that suppress the immune  system -medicines that treat cancer -steroid medicines like prednisone or cortisone What should I watch for while using this medicine? Visit your doctor or health care professional for regular checks on your progress. Your doctor or health care professional may order blood tests and other tests to see how you are doing. Call your doctor or health care professional if you get a cold or other infection while receiving this medicine. Do not treat yourself. This medicine may decrease your body's ability to fight infection. You should make sure you get enough calcium and vitamin D while you are taking this medicine, unless your doctor tells you not to. Discuss the foods you eat and the vitamins you take with your health care professional. See your dentist regularly. Brush and floss your teeth as directed. Before you have any dental work done, tell your dentist you are receiving this medicine. Do not become pregnant while taking this medicine or for 5 months after stopping it. Women should inform their doctor if they wish to become pregnant or think they might be pregnant. There is a potential for serious side effects to an unborn child. Talk to your health care professional or pharmacist for more information. What side effects may I notice from receiving this medicine? Side effects that you should report to your doctor or health care professional as soon as possible: -allergic reactions like skin rash, itching or hives, swelling of the face, lips, or tongue -breathing problems -chest pain -fast, irregular heartbeat -feeling faint or lightheaded, falls -fever, chills, or any other sign of infection -muscle spasms, tightening, or twitches -numbness or tingling -skin blisters or bumps, or is dry, peels, or red -slow   healing or unexplained pain in the mouth or jaw -unusual bleeding or bruising Side effects that usually do not require medical attention (report to your doctor or health care professional  if they continue or are bothersome): -muscle pain -stomach upset, gas Where should I keep my medicine? This medicine is only given in a clinic, doctor's office, or other health care setting and will not be stored at home.  2017 Elsevier/Gold Standard (2015-03-15 10:06:55)  

## 2016-03-31 NOTE — Progress Notes (Signed)
Hematology and Oncology Follow Up Visit  JAKIYAH ROSSER [id number] 1945/05/04 71 y.o. 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 Garing is here today for follow-up. She is feeling much better. Her headache has resolved and she is no longer taking the Topamax.  He sinusitis has also resolved. She has occasional nausea (no vomiting) has noticed that she has no appetite. She is trying her best to stay well hydrated. Her weight is unchanged. She verbalized that she is still taking her Cabometyx 40 mg PO every other day as prescribed.  No fatigue, fever, chills,cough, rash, SOB, chest pain, palpitations or changes in bowel or bladder habits.  No syncope or falls. No lymphadenopathy found on exam.  No episodes of bleeding or bruising.  No swelling, numbness or tingling in her extremities. No new aches or pains at this time.   Medications:  Allergies as of 03/31/2016      Reactions   Azithromycin Other (See Comments)   Stomach cramps.  Green black stool    Ciprofloxacin Other (See Comments)   Body aches Chills Heart races   Etodolac Palpitations, Other (See Comments)   Joints hurt   Gnp [acetaminophen] Other (See Comments)   "GRN" (cough syrup) Heart races   Moxifloxacin Palpitations, Other (See Comments)   Body ache   Tramadol Nausea And Vomiting, Other (See Comments)   Dizziness   Rofecoxib Other (See Comments)   Pt unsure       Medication List       Accurate as of 03/31/16  1:32 PM. Always use your most recent med list.          ADVAIR DISKUS 100-50 MCG/DOSE Aepb Generic drug:  Fluticasone-Salmeterol Inhale 1 puff into the lungs 2 (two) times  daily before a meal.   albuterol 90 MCG/ACT inhaler Commonly known as:  PROVENTIL,VENTOLIN Inhale 2 puffs into the lungs every 4 (four) hours as needed for wheezing.   amLODipine 5 MG tablet Commonly known as:  NORVASC Take 5 mg by mouth daily.   cabozantinib S-Malate 40 MG Tabs Commonly known as:  CABOMETYX Take 40 mg by mouth daily.   CALCIUM + D PO Take 1 tablet by mouth daily.   cefdinir 300 MG capsule Commonly known as:  OMNICEF Take 1 capsule (300 mg total) by mouth 2 (two) times daily.   colchicine 0.6 MG tablet Take 1 tablet (0.6 mg total) by mouth daily.   dicyclomine 20 MG tablet Commonly known as:  BENTYL Take 20 mg by mouth 3 (three) times daily as needed (Stomach Cramps).   fentaNYL 12 MCG/HR Commonly known as:  DURAGESIC - dosed mcg/hr Place 2 patches (25 mcg total) onto the skin every 3 (three) days.   HYDROcodone-acetaminophen 5-325 MG tablet Commonly known as:  NORCO Take 1 tablet by mouth every 6 (six) hours as needed.   LORazepam 0.5 MG tablet Commonly known as:  ATIVAN Take 0.5 mg by mouth every 4 (four) hours as needed for anxiety.   Magnesium 250 MG Tabs Take 250 mg by mouth daily.   meclizine 25 MG tablet Commonly known as:  ANTIVERT Take 12.5 mg  by mouth 3 (three) times daily as needed. For dizziness.   metoCLOPramide 10 MG tablet Commonly known as:  REGLAN Take 1 tablet (10 mg total) by mouth every 6 (six) hours as needed for nausea.   montelukast 10 MG tablet Commonly known as:  SINGULAIR Take 10 mg by mouth at bedtime.   nystatin 100000 UNIT/ML suspension Commonly known as:  MYCOSTATIN Take 5 mLs (500,000 Units total) by mouth 4 (four) times daily.   OSTEO BI-FLEX ADV DOUBLE ST PO Take 1 tablet by mouth daily.   pilocarpine 5 MG tablet Commonly known as:  SALAGEN Take 1 tablet (5 mg total) by mouth 2 (two) times daily.   PROBIOTIC DAILY PO Take 1 tablet by mouth daily.   ranitidine 150 MG tablet Commonly known as:   ZANTAC Take 150 mg by mouth 2 (two) times daily.   topiramate 25 MG tablet Commonly known as:  TOPAMAX Take 1 tablet (25 mg total) by mouth daily. May increase to twice a day after one week if migraine is not improved.   vitamin C 500 MG tablet Commonly known as:  ASCORBIC ACID Take 500 mg by mouth daily.   XGEVA 120 MG/1.7ML Soln injection Generic drug:  denosumab Inject 120 mg into the skin every 30 (thirty) days.       Allergies:  Allergies  Allergen Reactions  . Azithromycin Other (See Comments)    Stomach cramps.  Green black stool   . Ciprofloxacin Other (See Comments)    Body aches Chills Heart races  . Etodolac Palpitations and Other (See Comments)    Joints hurt   . Gnp [Acetaminophen] Other (See Comments)    "GRN" (cough syrup) Heart races  . Moxifloxacin Palpitations and Other (See Comments)    Body ache  . Tramadol Nausea And Vomiting and Other (See Comments)    Dizziness  . Rofecoxib Other (See Comments)    Pt unsure     Past Medical History, Surgical history, Social history, and Family History were reviewed and updated.  Review of Systems: All other 10 point review of systems is negative.   Physical Exam:  vitals were not taken for this visit.  Wt Readings from Last 3 Encounters:  03/10/16 119 lb 1 oz (54 kg)  03/04/16 120 lb 12 oz (54.8 kg)  03/01/16 120 lb (54.4 kg)    Ocular: Sclerae unicteric, pupils equal, round and reactive to light Ear-nose-throat: Oropharynx clear, dentition fair Lymphatic: No cervical supraclavicular or axillary adenopathy Lungs no rales or rhonchi, good excursion bilaterally Heart regular rate and rhythm, no murmur appreciated Abd soft, nontender, positive bowel sounds, no liver or spleen tip palpated on exam, no fluid wave  MSK Chronic back/right shoulder pain and tenderness due to metastatic disease, no joint edema Neuro: non-focal, well-oriented, appropriate affect Breasts: Deferred  Lab Results  Component  Value Date   WBC 6.5 03/10/2016   HGB 12.0 03/10/2016   HCT 37.5 03/10/2016   MCV 98 03/10/2016   PLT 298 03/10/2016   No results found for: FERRITIN, IRON, TIBC, UIBC, IRONPCTSAT Lab Results  Component Value Date   RBC 3.83 03/10/2016   No results found for: KPAFRELGTCHN, LAMBDASER, KAPLAMBRATIO No results found for: IGGSERUM, IGA, IGMSERUM No results found for: Ronnald Ramp, A1GS, A2GS, Violet Baldy, MSPIKE, SPEI   Chemistry      Component Value Date/Time   NA 140 03/10/2016 1003   NA 137 03/04/2016 1043   K 4.3 03/10/2016 1003   K 4.1 03/04/2016  1043   CL 102 03/10/2016 1003   CO2 26 03/10/2016 1003   CO2 22 03/04/2016 1043   BUN 17 03/10/2016 1003   BUN 14.6 03/04/2016 1043   CREATININE 1.2 03/10/2016 1003   CREATININE 1.3 (H) 03/04/2016 1043   GLU 104 07/20/2008      Component Value Date/Time   CALCIUM 9.8 03/10/2016 1003   CALCIUM 9.4 03/04/2016 1043   ALKPHOS 66 03/10/2016 1003   ALKPHOS 77 03/04/2016 1043   AST 28 03/10/2016 1003   AST 22 03/04/2016 1043   ALT 28 03/10/2016 1003   ALT 20 03/04/2016 1043   BILITOT 0.60 03/10/2016 1003   BILITOT 0.35 03/04/2016 1043     Impression and Plan: Ms. Gilleylen is a very pleasant 71 yo white female with metastatic clear cell renal carcinoma diagnosed in 2013. She is having a nice response on Cabometyx and verbalized that she is taking 40 mg PO every other day as prescribed. She is doing well and feeling much better.  He only complaint at this time is occasional nausea and no appetite. We will have her restart low dose prednisone at 5 mg PO daily to help with her symptoms. This has helped her in the past.  We will plan to repeat CT scans in April. Scan in January showed stable metastatic disease.  We will see her back in 1 month for repeat lab work, follow-up and injection.  She will contact our office with any questions or concerns. We can certainly see her sooner if need be.   Eliezer Bottom,  NP 2/5/20181:32 PM

## 2016-04-01 LAB — LACTATE DEHYDROGENASE: LDH: 260 U/L — AB (ref 125–245)

## 2016-04-28 ENCOUNTER — Other Ambulatory Visit (HOSPITAL_BASED_OUTPATIENT_CLINIC_OR_DEPARTMENT_OTHER): Payer: Medicare PPO

## 2016-04-28 ENCOUNTER — Ambulatory Visit (HOSPITAL_BASED_OUTPATIENT_CLINIC_OR_DEPARTMENT_OTHER): Payer: Medicare PPO | Admitting: Hematology & Oncology

## 2016-04-28 ENCOUNTER — Ambulatory Visit (HOSPITAL_BASED_OUTPATIENT_CLINIC_OR_DEPARTMENT_OTHER): Payer: Medicare PPO

## 2016-04-28 VITALS — BP 118/70 | HR 86 | Temp 98.2°F | Resp 16 | Wt 120.4 lb

## 2016-04-28 DIAGNOSIS — C649 Malignant neoplasm of unspecified kidney, except renal pelvis: Secondary | ICD-10-CM | POA: Diagnosis not present

## 2016-04-28 DIAGNOSIS — C7951 Secondary malignant neoplasm of bone: Secondary | ICD-10-CM | POA: Diagnosis not present

## 2016-04-28 DIAGNOSIS — M25562 Pain in left knee: Secondary | ICD-10-CM | POA: Diagnosis not present

## 2016-04-28 LAB — CBC WITH DIFFERENTIAL (CANCER CENTER ONLY)
BASO#: 0 10*3/uL (ref 0.0–0.2)
BASO%: 0.4 % (ref 0.0–2.0)
EOS%: 0.8 % (ref 0.0–7.0)
Eosinophils Absolute: 0 10*3/uL (ref 0.0–0.5)
HEMATOCRIT: 37.7 % (ref 34.8–46.6)
HEMOGLOBIN: 12.1 g/dL (ref 11.6–15.9)
LYMPH#: 1.5 10*3/uL (ref 0.9–3.3)
LYMPH%: 27.6 % (ref 14.0–48.0)
MCH: 31.1 pg (ref 26.0–34.0)
MCHC: 32.1 g/dL (ref 32.0–36.0)
MCV: 97 fL (ref 81–101)
MONO#: 0.4 10*3/uL (ref 0.1–0.9)
MONO%: 7.8 % (ref 0.0–13.0)
NEUT%: 63.4 % (ref 39.6–80.0)
NEUTROS ABS: 3.3 10*3/uL (ref 1.5–6.5)
Platelets: 177 10*3/uL (ref 145–400)
RBC: 3.89 10*6/uL (ref 3.70–5.32)
RDW: 17.5 % — ABNORMAL HIGH (ref 11.1–15.7)
WBC: 5.3 10*3/uL (ref 3.9–10.0)

## 2016-04-28 LAB — CMP (CANCER CENTER ONLY)
ALBUMIN: 3.5 g/dL (ref 3.3–5.5)
ALK PHOS: 53 U/L (ref 26–84)
ALT: 36 U/L (ref 10–47)
AST: 33 U/L (ref 11–38)
BILIRUBIN TOTAL: 0.6 mg/dL (ref 0.20–1.60)
BUN, Bld: 14 mg/dL (ref 7–22)
CO2: 28 mEq/L (ref 18–33)
Calcium: 9.4 mg/dL (ref 8.0–10.3)
Chloride: 104 mEq/L (ref 98–108)
Creat: 1.2 mg/dl (ref 0.6–1.2)
Glucose, Bld: 86 mg/dL (ref 73–118)
Potassium: 3.9 mEq/L (ref 3.3–4.7)
Sodium: 141 mEq/L (ref 128–145)
Total Protein: 6.6 g/dL (ref 6.4–8.1)

## 2016-04-28 LAB — LACTATE DEHYDROGENASE: LDH: 260 U/L — ABNORMAL HIGH (ref 125–245)

## 2016-04-28 MED ORDER — DENOSUMAB 120 MG/1.7ML ~~LOC~~ SOLN
120.0000 mg | Freq: Once | SUBCUTANEOUS | Status: AC
  Administered 2016-04-28: 120 mg via SUBCUTANEOUS
  Filled 2016-04-28: qty 1.7

## 2016-04-28 NOTE — Progress Notes (Signed)
Hematology and Oncology Follow Up Visit  Cathy Lewis [id number] [birth year] 71 y.o. 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 She really looks good. Her main problem has been some pain in her left knee. She's had this knee drained of fluid. It sounds like she may need to have knee replacement. She does not wish to have this.  She has done incredibly well with Cabometyx. She's had no problems with nausea or vomiting. She's had no diarrhea. She's had no issues with hair loss. There's been no cough  She is due for scans to be set up. We'll was skivvies at [facility].  Her appetite is doing quite well.  Overall, performance status is ECOG 0.  Medications:  Allergies as of 04/28/2016      Reactions   Azithromycin Other (See Comments)   Stomach cramps.  Green black stool    Ciprofloxacin Other (See Comments)   Body aches Chills Heart races   Etodolac Palpitations, Other (See Comments)   Joints hurt   Gnp [acetaminophen] Other (See Comments)   "GRN" (cough syrup) Heart races   Moxifloxacin Palpitations, Other (See Comments)   Body ache   Tramadol Nausea And Vomiting, Other (See Comments)   Dizziness   Rofecoxib Other (See Comments)   Pt unsure       Medication List       Accurate as of 04/28/16 11:34 AM. Always use your most recent med list.          ADVAIR DISKUS 100-50 MCG/DOSE Aepb Generic drug:  Fluticasone-Salmeterol Inhale 1 puff into the lungs 2 (two) times daily before a meal.   albuterol 90 MCG/ACT inhaler Commonly known as:  PROVENTIL,VENTOLIN Inhale 2 puffs into the lungs every 4  (four) hours as needed for wheezing.   amLODipine 5 MG tablet Commonly known as:  NORVASC Take 5 mg by mouth daily.   CABOMETYX 40 MG Tabs Generic drug:  cabozantinib S-Malate TAKE 1 TABLET BY MOUTH DAILY   CALCIUM + D PO Take 1 tablet by mouth daily.   colchicine 0.6 MG tablet Take 1 tablet (0.6 mg total) by mouth daily.   dicyclomine 20 MG tablet Commonly known as:  BENTYL Take 20 mg by mouth 3 (three) times daily as needed (Stomach Cramps).   fentaNYL 12 MCG/HR Commonly known as:  DURAGESIC - dosed mcg/hr Place 12.5 mcg onto the skin every 3 (three) days.   HYDROcodone-acetaminophen 5-325 MG tablet Commonly known as:  NORCO Take 1 tablet by mouth every 6 (six) hours as needed.   LORazepam 0.5 MG tablet Commonly known as:  ATIVAN Take 0.5 mg by mouth every 4 (four) hours as needed for anxiety.   Magnesium 250 MG Tabs Take 250 mg by mouth daily.   meclizine 25 MG tablet Commonly known as:  ANTIVERT Take 12.5 mg by mouth 3 (three) times daily as needed. For dizziness.   metoCLOPramide 10 MG tablet Commonly known as:  REGLAN Take 1 tablet (10 mg total) by mouth every 6 (six) hours as needed for nausea.   montelukast 10 MG tablet Commonly known  as:  SINGULAIR Take 10 mg by mouth at bedtime.   nystatin 100000 UNIT/ML suspension Commonly known as:  MYCOSTATIN Take 5 mLs (500,000 Units total) by mouth 4 (four) times daily.   OSTEO BI-FLEX ADV DOUBLE ST PO Take 1 tablet by mouth daily.   pilocarpine 5 MG tablet Commonly known as:  SALAGEN Take 1 tablet (5 mg total) by mouth 2 (two) times daily.   predniSONE 5 MG tablet Commonly known as:  DELTASONE Take 1 tablet (5 mg total) by mouth daily with breakfast.   PROBIOTIC DAILY PO Take 1 tablet by mouth daily.   ranitidine 150 MG tablet Commonly known as:  ZANTAC Take 150 mg by mouth 2 (two) times daily.   vitamin C 500 MG tablet Commonly known as:  ASCORBIC ACID Take 500 mg by mouth daily.   XGEVA 120  MG/1.7ML Soln injection Generic drug:  denosumab Inject 120 mg into the skin every 30 (thirty) days.       Allergies:  Allergies  Allergen Reactions  . Azithromycin Other (See Comments)    Stomach cramps.  Green black stool   . Ciprofloxacin Other (See Comments)    Body aches Chills Heart races  . Etodolac Palpitations and Other (See Comments)    Joints hurt   . Gnp [Acetaminophen] Other (See Comments)    "GRN" (cough syrup) Heart races  . Moxifloxacin Palpitations and Other (See Comments)    Body ache  . Tramadol Nausea And Vomiting and Other (See Comments)    Dizziness  . Rofecoxib Other (See Comments)    Pt unsure     Past Medical History, Surgical history, Social history, and Family History were reviewed and updated.  Review of Systems: All other 10 point review of systems is negative.   Physical Exam:  weight is 120 lb 6.4 oz (54.6 kg). Her oral temperature is 98.2 F (36.8 C). Her blood pressure is 118/70 and her pulse is 86. Her respiration is 16 and oxygen saturation is 100%.   Wt Readings from Last 3 Encounters:  04/28/16 120 lb 6.4 oz (54.6 kg)  03/31/16 120 lb (54.4 kg)  03/10/16 119 lb 1 oz (54 kg)    Ocular: Sclerae unicteric, pupils equal, round and reactive to light Ear-nose-throat: Oropharynx clear, dentition fair Lymphatic: No cervical supraclavicular or axillary adenopathy Lungs no rales or rhonchi, good excursion bilaterally Heart regular rate and rhythm, no murmur appreciated Abd soft, nontender, positive bowel sounds, no liver or spleen tip palpated on exam, no fluid wave  MSK Chronic back/right shoulder pain and tenderness due to metastatic disease, no joint edema Neuro: non-focal, well-oriented, appropriate affect Breasts: Deferred  Lab Results  Component Value Date   WBC 5.3 04/28/2016   HGB 12.1 04/28/2016   HCT 37.7 04/28/2016   MCV 97 04/28/2016   PLT 177 04/28/2016   No results found for: FERRITIN, IRON, TIBC, UIBC,  IRONPCTSAT Lab Results  Component Value Date   RBC 3.89 04/28/2016   No results found for: KPAFRELGTCHN, LAMBDASER, KAPLAMBRATIO No results found for: IGGSERUM, IGA, IGMSERUM No results found for: Odetta Pink, SPEI   Chemistry      Component Value Date/Time   NA 141 04/28/2016 1047   NA 137 03/04/2016 1043   K 3.9 04/28/2016 1047   K 4.1 03/04/2016 1043   CL 104 04/28/2016 1047   CO2 28 04/28/2016 1047   CO2 22 03/04/2016 1043   BUN 14 04/28/2016 1047   BUN  14.6 03/04/2016 1043   CREATININE 1.2 04/28/2016 1047   CREATININE 1.3 (H) 03/04/2016 1043   GLU 104 07/20/2008      Component Value Date/Time   CALCIUM 9.4 04/28/2016 1047   CALCIUM 9.4 03/04/2016 1043   ALKPHOS 53 04/28/2016 1047   ALKPHOS 77 03/04/2016 1043   AST 33 04/28/2016 1047   AST 22 03/04/2016 1043   ALT 36 04/28/2016 1047   ALT 20 03/04/2016 1043   BILITOT 0.60 04/28/2016 1047   BILITOT 0.35 03/04/2016 1043     Impression and Plan: Ms. Oesterle is a very pleasant 71 yo white female with metastatic clear cell renal carcinoma diagnosed in 2013. She is having a nice response on Cabometyx and verbalized that she is taking 40 mg PO every other day as prescribed.   We will go ahead and get the scans set up. We will do a CT scan and bone scan. I would have to think that these will show that she is still responding.   If she needs to have a Knee surgery, we can certainly work with the orthopedist to coordinate with them the dosing of Cabometyx.   I will plan to give her the Xgeva today. We will plan to see her back in another 6 weeks.   Volanda Napoleon, MD 3/5/201811:34 AM

## 2016-05-02 NOTE — Addendum Note (Signed)
Addended by: Burney Gauze R on: 05/02/2016 11:16 AM   Modules accepted: Orders

## 2016-06-09 ENCOUNTER — Ambulatory Visit (HOSPITAL_BASED_OUTPATIENT_CLINIC_OR_DEPARTMENT_OTHER): Payer: Medicare PPO | Admitting: Hematology & Oncology

## 2016-06-09 ENCOUNTER — Ambulatory Visit (HOSPITAL_BASED_OUTPATIENT_CLINIC_OR_DEPARTMENT_OTHER): Payer: Medicare PPO

## 2016-06-09 ENCOUNTER — Other Ambulatory Visit (HOSPITAL_BASED_OUTPATIENT_CLINIC_OR_DEPARTMENT_OTHER): Payer: Medicare PPO

## 2016-06-09 VITALS — BP 115/63 | HR 92 | Temp 98.0°F | Resp 18 | Wt 121.8 lb

## 2016-06-09 DIAGNOSIS — R911 Solitary pulmonary nodule: Secondary | ICD-10-CM | POA: Diagnosis not present

## 2016-06-09 DIAGNOSIS — C649 Malignant neoplasm of unspecified kidney, except renal pelvis: Secondary | ICD-10-CM

## 2016-06-09 DIAGNOSIS — C7951 Secondary malignant neoplasm of bone: Secondary | ICD-10-CM

## 2016-06-09 DIAGNOSIS — M25562 Pain in left knee: Secondary | ICD-10-CM | POA: Diagnosis not present

## 2016-06-09 LAB — CMP (CANCER CENTER ONLY)
ALBUMIN: 3.6 g/dL (ref 3.3–5.5)
ALK PHOS: 50 U/L (ref 26–84)
ALT: 43 U/L (ref 10–47)
AST: 31 U/L (ref 11–38)
BILIRUBIN TOTAL: 0.6 mg/dL (ref 0.20–1.60)
BUN, Bld: 19 mg/dL (ref 7–22)
CALCIUM: 9.4 mg/dL (ref 8.0–10.3)
CO2: 28 mEq/L (ref 18–33)
Chloride: 102 mEq/L (ref 98–108)
Creat: 1.4 mg/dl — ABNORMAL HIGH (ref 0.6–1.2)
GLUCOSE: 114 mg/dL (ref 73–118)
POTASSIUM: 4.6 meq/L (ref 3.3–4.7)
Sodium: 139 mEq/L (ref 128–145)
TOTAL PROTEIN: 6.5 g/dL (ref 6.4–8.1)

## 2016-06-09 LAB — CBC WITH DIFFERENTIAL (CANCER CENTER ONLY)
BASO#: 0 10*3/uL (ref 0.0–0.2)
BASO%: 0.4 % (ref 0.0–2.0)
EOS%: 0.4 % (ref 0.0–7.0)
Eosinophils Absolute: 0 10*3/uL (ref 0.0–0.5)
HEMATOCRIT: 36.3 % (ref 34.8–46.6)
HEMOGLOBIN: 11.7 g/dL (ref 11.6–15.9)
LYMPH#: 1.3 10*3/uL (ref 0.9–3.3)
LYMPH%: 23 % (ref 14.0–48.0)
MCH: 31.7 pg (ref 26.0–34.0)
MCHC: 32.2 g/dL (ref 32.0–36.0)
MCV: 98 fL (ref 81–101)
MONO#: 0.3 10*3/uL (ref 0.1–0.9)
MONO%: 6 % (ref 0.0–13.0)
NEUT%: 70.2 % (ref 39.6–80.0)
NEUTROS ABS: 4 10*3/uL (ref 1.5–6.5)
Platelets: 210 10*3/uL (ref 145–400)
RBC: 3.69 10*6/uL — AB (ref 3.70–5.32)
RDW: 18.1 % — ABNORMAL HIGH (ref 11.1–15.7)
WBC: 5.7 10*3/uL (ref 3.9–10.0)

## 2016-06-09 MED ORDER — DENOSUMAB 120 MG/1.7ML ~~LOC~~ SOLN
120.0000 mg | Freq: Once | SUBCUTANEOUS | Status: AC
  Administered 2016-06-09: 120 mg via SUBCUTANEOUS
  Filled 2016-06-09: qty 1.7

## 2016-06-09 MED FILL — CABOMETYX 40 MG TABLET: 40 | 30 days supply | Qty: 30 | Fill #1

## 2016-06-09 NOTE — Progress Notes (Signed)
Hematology and Oncology Follow Up Visit  Cathy Lewis [id number] [birth year] 71 y.o. 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 She really looks good.   We did go ahead and do a bone scan and a CT scan on her. Bone scan showed stable bony disease. The CT scan showed that she had some increase in some pulmonary nodules. There still quite small. There are no new nodules.  She feels well. She went to see her grandson play baseball over the weekend. She had a good time doing this.  She is complaining of pain in her left knee. She has seen orthopedic surgery for this area and she has had an arthrocentesis done. She is still not sure if she will need knee surgery.   She has done incredibly well with Cabometyx. She's had no problems with nausea or vomiting. She's had no diarrhea. She's had no issues with hair loss. There's been no cough  She is due for scans to be set up. We'll was skivvies at [facility].  Her appetite is doing quite well.  Overall, performance status is ECOG 0.  Medications:  Allergies as of 06/09/2016      Reactions   Azithromycin Other (See Comments)   Stomach cramps.  Green black stool    Ciprofloxacin Other (See Comments)   Body aches Chills Heart races   Etodolac Palpitations, Other (See Comments)   Joints hurt   Gnp [acetaminophen] Other (See Comments)   "GRN" (cough syrup) Heart races   Moxifloxacin Palpitations, Other (See Comments)   Body ache   Tramadol Nausea And Vomiting, Other (See Comments)   Dizziness   Rofecoxib Other (See Comments)   Pt unsure       Medication List         Accurate as of 06/09/16  3:33 PM. Always use your most recent med list.          ADVAIR DISKUS 100-50 MCG/DOSE Aepb Generic drug:  Fluticasone-Salmeterol Inhale 1 puff into the lungs 2 (two) times daily before a meal.   albuterol 90 MCG/ACT inhaler Commonly known as:  PROVENTIL,VENTOLIN Inhale 2 puffs into the lungs every 4 (four) hours as needed for wheezing.   allopurinol 100 MG tablet Commonly known as:  ZYLOPRIM Take 100 mg by mouth daily.   amLODipine 5 MG tablet Commonly known as:  NORVASC Take 5 mg by mouth daily.   CABOMETYX 40 MG Tabs Generic drug:  cabozantinib S-Malate TAKE 1 TABLET BY MOUTH DAILY   CALCIUM + D PO Take 1 tablet by mouth daily.   colchicine 0.6 MG tablet Take 1 tablet (0.6 mg total) by mouth daily.   dicyclomine 20 MG tablet Commonly known as:  BENTYL Take 20 mg by mouth 3 (three) times daily as needed (Stomach Cramps).   fentaNYL 12 MCG/HR Commonly known as:  DURAGESIC - dosed mcg/hr Place 12.5 mcg onto the skin every 3 (three) days.   HYDROcodone-acetaminophen 5-325 MG tablet Commonly known as:  NORCO Take 1 tablet by mouth every 6 (six) hours as needed.   LORazepam 0.5 MG tablet Commonly  known as:  ATIVAN Take 0.5 mg by mouth every 4 (four) hours as needed for anxiety.   Magnesium 250 MG Tabs Take 250 mg by mouth daily.   meclizine 25 MG tablet Commonly known as:  ANTIVERT Take 12.5 mg by mouth 3 (three) times daily as needed. For dizziness.   metoCLOPramide 10 MG tablet Commonly known as:  REGLAN Take 1 tablet (10 mg total) by mouth every 6 (six) hours as needed for nausea.   montelukast 10 MG tablet Commonly known as:  SINGULAIR Take 10 mg by mouth at bedtime.   nystatin 100000 UNIT/ML suspension Commonly known as:  MYCOSTATIN Take 5 mLs (500,000 Units total) by mouth 4 (four) times daily.   OSTEO BI-FLEX ADV DOUBLE ST PO Take 1 tablet by mouth daily.   pilocarpine 5 MG tablet Commonly known as:   SALAGEN Take 1 tablet (5 mg total) by mouth 2 (two) times daily.   predniSONE 5 MG tablet Commonly known as:  DELTASONE Take 1 tablet (5 mg total) by mouth daily with breakfast.   PROBIOTIC DAILY PO Take 1 tablet by mouth daily.   ranitidine 150 MG tablet Commonly known as:  ZANTAC Take 150 mg by mouth 2 (two) times daily.   vitamin C 500 MG tablet Commonly known as:  ASCORBIC ACID Take 500 mg by mouth daily.   XGEVA 120 MG/1.7ML Soln injection Generic drug:  denosumab Inject 120 mg into the skin every 30 (thirty) days.       Allergies:  Allergies  Allergen Reactions  . Azithromycin Other (See Comments)    Stomach cramps.  Green black stool   . Ciprofloxacin Other (See Comments)    Body aches Chills Heart races  . Etodolac Palpitations and Other (See Comments)    Joints hurt   . Gnp [Acetaminophen] Other (See Comments)    "GRN" (cough syrup) Heart races  . Moxifloxacin Palpitations and Other (See Comments)    Body ache  . Tramadol Nausea And Vomiting and Other (See Comments)    Dizziness  . Rofecoxib Other (See Comments)    Pt unsure     Past Medical History, Surgical history, Social history, and Family History were reviewed and updated.  Review of Systems: All other 10 point review of systems is negative.   Physical Exam:  weight is 121 lb 12.8 oz (55.2 kg). Her oral temperature is 98 F (36.7 C). Her blood pressure is 115/63 and her pulse is 92. Her respiration is 18 and oxygen saturation is 98%.   Wt Readings from Last 3 Encounters:  06/09/16 121 lb 12.8 oz (55.2 kg)  04/28/16 120 lb 6.4 oz (54.6 kg)  03/31/16 120 lb (54.4 kg)    Ocular: Sclerae unicteric, pupils equal, round and reactive to light Ear-nose-throat: Oropharynx clear, dentition fair Lymphatic: No cervical supraclavicular or axillary adenopathy Lungs no rales or rhonchi, good excursion bilaterally Heart regular rate and rhythm, no murmur appreciated Abd soft, nontender, positive  bowel sounds, no liver or spleen tip palpated on exam, no fluid wave  MSK Chronic back/right shoulder pain and tenderness due to metastatic disease, no joint edema Neuro: non-focal, well-oriented, appropriate affect Breasts: Deferred  Lab Results  Component Value Date   WBC 5.7 06/09/2016   HGB 11.7 06/09/2016   HCT 36.3 06/09/2016   MCV 98 06/09/2016   PLT 210 06/09/2016   No results found for: FERRITIN, IRON, TIBC, UIBC, IRONPCTSAT Lab Results  Component Value Date   RBC 3.69 (L) 06/09/2016  No results found for: KPAFRELGTCHN, LAMBDASER, KAPLAMBRATIO No results found for: IGGSERUM, IGA, IGMSERUM No results found for: Odetta Pink, SPEI   Chemistry      Component Value Date/Time   NA 139 06/09/2016 1405   NA 137 03/04/2016 1043   K 4.6 06/09/2016 1405   K 4.1 03/04/2016 1043   CL 102 06/09/2016 1405   CO2 28 06/09/2016 1405   CO2 22 03/04/2016 1043   BUN 19 06/09/2016 1405   BUN 14.6 03/04/2016 1043   CREATININE 1.4 (H) 06/09/2016 1405   CREATININE 1.3 (H) 03/04/2016 1043   GLU 104 07/20/2008      Component Value Date/Time   CALCIUM 9.4 06/09/2016 1405   CALCIUM 9.4 03/04/2016 1043   ALKPHOS 50 06/09/2016 1405   ALKPHOS 77 03/04/2016 1043   AST 31 06/09/2016 1405   AST 22 03/04/2016 1043   ALT 43 06/09/2016 1405   ALT 20 03/04/2016 1043   BILITOT 0.60 06/09/2016 1405   BILITOT 0.35 03/04/2016 1043     Impression and Plan: Cathy Lewis is a very pleasant 71 yo white female with metastatic clear cell renal carcinoma diagnosed in 2013. She is having a nice response on Cabometyx and verbalized that she is taking 40 mg PO every other day as prescribed.   I think overall, that the scans show that she has stable disease. As such, I will not change her Cabometyx protocol and we will keep her on 40 mg every other day.  It sounds like she is having more problems with her knees. She is being seen by orthopedic surgery.  I think she wants to see if this new Flexogenics will help her. I told her it is worthwhile trying.   I will plan to give her the Xgeva today. We will plan to see her back in another 6 weeks.   Volanda Napoleon, MD 4/16/20183:33 PM

## 2016-06-10 LAB — LACTATE DEHYDROGENASE: LDH: 284 U/L — AB (ref 125–245)

## 2016-07-15 MED FILL — CABOMETYX 40 MG TABLET: 40 | 30 days supply | Qty: 30 | Fill #2

## 2016-07-22 ENCOUNTER — Ambulatory Visit (HOSPITAL_BASED_OUTPATIENT_CLINIC_OR_DEPARTMENT_OTHER): Payer: Medicare PPO | Admitting: Family

## 2016-07-22 ENCOUNTER — Ambulatory Visit (HOSPITAL_BASED_OUTPATIENT_CLINIC_OR_DEPARTMENT_OTHER): Payer: Medicare PPO

## 2016-07-22 ENCOUNTER — Other Ambulatory Visit (HOSPITAL_BASED_OUTPATIENT_CLINIC_OR_DEPARTMENT_OTHER): Payer: Medicare PPO

## 2016-07-22 VITALS — BP 119/62 | HR 83 | Temp 98.5°F | Resp 16 | Wt 120.8 lb

## 2016-07-22 DIAGNOSIS — C7951 Secondary malignant neoplasm of bone: Secondary | ICD-10-CM

## 2016-07-22 DIAGNOSIS — C649 Malignant neoplasm of unspecified kidney, except renal pelvis: Secondary | ICD-10-CM

## 2016-07-22 DIAGNOSIS — M25562 Pain in left knee: Secondary | ICD-10-CM

## 2016-07-22 LAB — CBC WITH DIFFERENTIAL (CANCER CENTER ONLY)
BASO#: 0 10*3/uL (ref 0.0–0.2)
BASO%: 0.2 % (ref 0.0–2.0)
EOS%: 0.2 % (ref 0.0–7.0)
Eosinophils Absolute: 0 10*3/uL (ref 0.0–0.5)
HCT: 38.7 % (ref 34.8–46.6)
HGB: 12.5 g/dL (ref 11.6–15.9)
LYMPH#: 1.6 10*3/uL (ref 0.9–3.3)
LYMPH%: 26.6 % (ref 14.0–48.0)
MCH: 32.1 pg (ref 26.0–34.0)
MCHC: 32.3 g/dL (ref 32.0–36.0)
MCV: 99 fL (ref 81–101)
MONO#: 0.5 10*3/uL (ref 0.1–0.9)
MONO%: 7.8 % (ref 0.0–13.0)
NEUT#: 3.9 10*3/uL (ref 1.5–6.5)
NEUT%: 65.2 % (ref 39.6–80.0)
PLATELETS: 205 10*3/uL (ref 145–400)
RBC: 3.9 10*6/uL (ref 3.70–5.32)
RDW: 18.5 % — ABNORMAL HIGH (ref 11.1–15.7)
WBC: 5.9 10*3/uL (ref 3.9–10.0)

## 2016-07-22 LAB — CMP (CANCER CENTER ONLY)
ALT: 38 U/L (ref 10–47)
AST: 31 U/L (ref 11–38)
Albumin: 3.2 g/dL — ABNORMAL LOW (ref 3.3–5.5)
Alkaline Phosphatase: 49 U/L (ref 26–84)
BUN: 15 mg/dL (ref 7–22)
CHLORIDE: 105 meq/L (ref 98–108)
CO2: 29 mEq/L (ref 18–33)
CREATININE: 1.3 mg/dL — AB (ref 0.6–1.2)
Calcium: 9.2 mg/dL (ref 8.0–10.3)
Glucose, Bld: 71 mg/dL — ABNORMAL LOW (ref 73–118)
Potassium: 3.5 mEq/L (ref 3.3–4.7)
SODIUM: 138 meq/L (ref 128–145)
TOTAL PROTEIN: 6.2 g/dL — AB (ref 6.4–8.1)
Total Bilirubin: 0.7 mg/dl (ref 0.20–1.60)

## 2016-07-22 LAB — LACTATE DEHYDROGENASE: LDH: 272 U/L — AB (ref 125–245)

## 2016-07-22 MED ORDER — DENOSUMAB 120 MG/1.7ML ~~LOC~~ SOLN
120.0000 mg | Freq: Once | SUBCUTANEOUS | Status: AC
  Administered 2016-07-22: 120 mg via SUBCUTANEOUS
  Filled 2016-07-22: qty 1.7

## 2016-07-22 NOTE — Progress Notes (Signed)
Hematology and Oncology Follow Up Visit  CERYS WINGET [id number] 27-Jan-1946 71 y.o. 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 Linnemann is here today for follow-up. She is having some discomfort off and on in her right knee and has a small "bump" on the outer portion of the right knee as well as a rough "bony " spot on the front outer portion of the knee. We will have her follow back up with Dr. Rhona Raider for this.  Her shoulder pain is managed well with the Durgesic patch.  She verbalized that she is taking her Cabometyx every other day as prescribed.  No fever, chills, n/v, cough, rash, dizziness, SOB, chest pain, palpitations, abdominal pain or changes in bowel or bladder habits. She takes a stool softener for her occasional bout with constipation.  She has some tenderness and redness in her left and right heels and started with treatment with Allopurinol and is now on Prednisone. The redness and tenderness have resolved.  She has maintained a good appetite and is staying well hydrated. Her weight is stable.   ECOG Performance Status: 1 - Symptomatic but completely ambulatory  Medications:  Allergies as of 07/22/2016      Reactions   Azithromycin Other (See Comments)   Stomach cramps.  Green black stool    Ciprofloxacin Other (See Comments)   Body aches Chills Heart races   Etodolac Palpitations, Other (See Comments)   Joints hurt   Gnp [acetaminophen] Other (See Comments)   "GRN" (cough syrup) Heart races   Moxifloxacin Palpitations, Other (See Comments)   Body ache   Tramadol Nausea And Vomiting, Other (See Comments)   Dizziness   Rofecoxib Other (See Comments)   Pt unsure       Medication List       Accurate as of 07/22/16 10:21 AM. Always use your most recent med list.          ADVAIR DISKUS 100-50 MCG/DOSE Aepb Generic drug:  Fluticasone-Salmeterol Inhale 1 puff into the lungs 2 (two) times daily before a meal.   albuterol 90 MCG/ACT inhaler Commonly known as:  PROVENTIL,VENTOLIN Inhale 2 puffs into the lungs every 4 (four) hours as needed for wheezing.   allopurinol 100 MG tablet Commonly known as:  ZYLOPRIM Take 100 mg by mouth daily.   amLODipine 5 MG tablet Commonly known as:  NORVASC Take 5 mg by mouth daily.   CABOMETYX 40 MG Tabs Generic drug:  cabozantinib S-Malate TAKE 1 TABLET BY MOUTH DAILY   CALCIUM + D PO Take 1 tablet by mouth daily.   colchicine 0.6 MG tablet Take 1 tablet (0.6 mg total) by mouth daily.   dicyclomine 20 MG tablet Commonly known as:  BENTYL Take 20 mg by mouth 3 (three) times daily as needed (Stomach Cramps).   fentaNYL 12 MCG/HR Commonly known as:  DURAGESIC - dosed mcg/hr Place 12.5 mcg onto the skin every 3 (three) days.   HYDROcodone-acetaminophen 5-325 MG tablet Commonly known as:  NORCO Take 1 tablet by mouth every 6 (six) hours as needed.   LORazepam 0.5 MG tablet Commonly known as:  ATIVAN  Take 0.5 mg by mouth every 4 (four) hours as needed for anxiety.   Magnesium 250 MG Tabs Take 250 mg by mouth daily.   meclizine 25 MG tablet Commonly known as:  ANTIVERT Take 12.5 mg by mouth 3 (three) times daily as needed. For dizziness.   metoCLOPramide 10 MG tablet Commonly known as:  REGLAN Take 1 tablet (10 mg total) by mouth every 6 (six) hours as needed for nausea.   montelukast 10 MG tablet Commonly known as:  SINGULAIR Take 10 mg by mouth at bedtime.   nystatin 100000 UNIT/ML suspension Commonly known as:  MYCOSTATIN Take 5 mLs (500,000 Units total) by mouth 4 (four) times daily.   OSTEO BI-FLEX ADV DOUBLE ST PO Take 1 tablet by  mouth daily.   pilocarpine 5 MG tablet Commonly known as:  SALAGEN Take 1 tablet (5 mg total) by mouth 2 (two) times daily.   predniSONE 5 MG tablet Commonly known as:  DELTASONE Take 1 tablet (5 mg total) by mouth daily with breakfast.   PROBIOTIC DAILY PO Take 1 tablet by mouth daily.   ranitidine 150 MG tablet Commonly known as:  ZANTAC Take 150 mg by mouth 2 (two) times daily.   vitamin C 500 MG tablet Commonly known as:  ASCORBIC ACID Take 500 mg by mouth daily.   XGEVA 120 MG/1.7ML Soln injection Generic drug:  denosumab Inject 120 mg into the skin every 30 (thirty) days.       Allergies:  Allergies  Allergen Reactions  . Azithromycin Other (See Comments)    Stomach cramps.  Green black stool   . Ciprofloxacin Other (See Comments)    Body aches Chills Heart races  . Etodolac Palpitations and Other (See Comments)    Joints hurt   . Gnp [Acetaminophen] Other (See Comments)    "GRN" (cough syrup) Heart races  . Moxifloxacin Palpitations and Other (See Comments)    Body ache  . Tramadol Nausea And Vomiting and Other (See Comments)    Dizziness  . Rofecoxib Other (See Comments)    Pt unsure     Past Medical History, Surgical history, Social history, and Family History were reviewed and updated.  Review of Systems: All other 10 point review of systems is negative.   Physical Exam:  vitals were not taken for this visit.  Wt Readings from Last 3 Encounters:  06/09/16 121 lb 12.8 oz (55.2 kg)  04/28/16 120 lb 6.4 oz (54.6 kg)  03/31/16 120 lb (54.4 kg)    Ocular: Sclerae unicteric, pupils equal, round and reactive to light Ear-nose-throat: Oropharynx clear, dentition fair Lymphatic: No cervical, supraclavicular or axillary adenopathy Lungs no rales or rhonchi, good excursion bilaterally Heart regular rate and rhythm, no murmur appreciated Abd soft, nontender, positive bowel sounds, no liver or spleen tip palpated on exam, no fluid wave  MSK no  focal spinal tenderness, no joint edema Neuro: non-focal, well-oriented, appropriate affect Breasts: Deferred   Lab Results  Component Value Date   WBC 5.9 07/22/2016   HGB 12.5 07/22/2016   HCT 38.7 07/22/2016   MCV 99 07/22/2016   PLT 205 07/22/2016   No results found for: FERRITIN, IRON, TIBC, UIBC, IRONPCTSAT Lab Results  Component Value Date   RBC 3.90 07/22/2016   No results found for: KPAFRELGTCHN, LAMBDASER, KAPLAMBRATIO No results found for: IGGSERUM, IGA, IGMSERUM No results found for: TOTALPROTELP, ALBUMINELP, A1GS, A2GS, BETS, BETA2SER, GAMS, MSPIKE, SPEI   Chemistry      Component Value Date/Time  NA 139 06/09/2016 1405   NA 137 03/04/2016 1043   K 4.6 06/09/2016 1405   K 4.1 03/04/2016 1043   CL 102 06/09/2016 1405   CO2 28 06/09/2016 1405   CO2 22 03/04/2016 1043   BUN 19 06/09/2016 1405   BUN 14.6 03/04/2016 1043   CREATININE 1.4 (H) 06/09/2016 1405   CREATININE 1.3 (H) 03/04/2016 1043   GLU 104 07/20/2008      Component Value Date/Time   CALCIUM 9.4 06/09/2016 1405   CALCIUM 9.4 03/04/2016 1043   ALKPHOS 50 06/09/2016 1405   ALKPHOS 77 03/04/2016 1043   AST 31 06/09/2016 1405   AST 22 03/04/2016 1043   ALT 43 06/09/2016 1405   ALT 20 03/04/2016 1043   BILITOT 0.60 06/09/2016 1405   BILITOT 0.35 03/04/2016 1043      Impression and Plan: Ms. Towe is a very pleasant 71 yo caucasian female with metastatic clear cell renal carcinoma diagnosed in 2013. She continues to do well on Cabometyx and we will have her continue on this same every other day regimen.  We will plan to rescan her in July.  She will follow up with Dr. Rhona Raider regarding her right knee discomfort.  She received her Delton See today as planned.  We will plan to see her back again in 1 month for repeat lab work, follow-up and injection.  She will contact our office with any questions or concerns. We can certainly see her sooner if need be.   Eliezer Bottom, NP 5/29/201810:21  AM

## 2016-07-22 NOTE — Patient Instructions (Signed)
Denosumab injection What is this medicine? DENOSUMAB (den oh sue mab) slows bone breakdown. Prolia is used to treat osteoporosis in women after menopause and in men. Xgeva is used to prevent bone fractures and other bone problems caused by cancer bone metastases. Xgeva is also used to treat giant cell tumor of the bone. COMMON BRAND NAME(S): Prolia, XGEVA What should I tell my health care provider before I take this medicine? They need to know if you have any of these conditions: -dental disease -eczema -infection or history of infections -kidney disease or on dialysis -low blood calcium or vitamin D -malabsorption syndrome -scheduled to have surgery or tooth extraction -taking medicine that contains denosumab -thyroid or parathyroid disease -an unusual reaction to denosumab, other medicines, foods, dyes, or preservatives -pregnant or trying to get pregnant -breast-feeding How should I use this medicine? This medicine is for injection under the skin. It is given by a health care professional in a hospital or clinic setting. If you are getting Prolia, a special MedGuide will be given to you by the pharmacist with each prescription and refill. Be sure to read this information carefully each time. For Prolia, talk to your pediatrician regarding the use of this medicine in children. Special care may be needed. For Xgeva, talk to your pediatrician regarding the use of this medicine in children. While this drug may be prescribed for children as young as 13 years for selected conditions, precautions do apply. What if I miss a dose? It is important not to miss your dose. Call your doctor or health care professional if you are unable to keep an appointment. What may interact with this medicine? Do not take this medicine with any of the following medications: -other medicines containing denosumab This medicine may also interact with the following medications: -medicines that suppress the immune  system -medicines that treat cancer -steroid medicines like prednisone or cortisone What should I watch for while using this medicine? Visit your doctor or health care professional for regular checks on your progress. Your doctor or health care professional may order blood tests and other tests to see how you are doing. Call your doctor or health care professional if you get a cold or other infection while receiving this medicine. Do not treat yourself. This medicine may decrease your body's ability to fight infection. You should make sure you get enough calcium and vitamin D while you are taking this medicine, unless your doctor tells you not to. Discuss the foods you eat and the vitamins you take with your health care professional. See your dentist regularly. Brush and floss your teeth as directed. Before you have any dental work done, tell your dentist you are receiving this medicine. Do not become pregnant while taking this medicine or for 5 months after stopping it. Women should inform their doctor if they wish to become pregnant or think they might be pregnant. There is a potential for serious side effects to an unborn child. Talk to your health care professional or pharmacist for more information. What side effects may I notice from receiving this medicine? Side effects that you should report to your doctor or health care professional as soon as possible: -allergic reactions like skin rash, itching or hives, swelling of the face, lips, or tongue -breathing problems -chest pain -fast, irregular heartbeat -feeling faint or lightheaded, falls -fever, chills, or any other sign of infection -muscle spasms, tightening, or twitches -numbness or tingling -skin blisters or bumps, or is dry, peels, or red -slow   healing or unexplained pain in the mouth or jaw -unusual bleeding or bruising Side effects that usually do not require medical attention (report to your doctor or health care professional  if they continue or are bothersome): -muscle pain -stomach upset, gas Where should I keep my medicine? This medicine is only given in a clinic, doctor's office, or other health care setting and will not be stored at home.  2017 Elsevier/Gold Standard (2015-03-15 10:06:55)  

## 2016-07-30 ENCOUNTER — Ambulatory Visit (INDEPENDENT_AMBULATORY_CARE_PROVIDER_SITE_OTHER): Payer: Medicare PPO

## 2016-07-30 ENCOUNTER — Encounter: Payer: Self-pay | Admitting: Podiatry

## 2016-07-30 ENCOUNTER — Ambulatory Visit (INDEPENDENT_AMBULATORY_CARE_PROVIDER_SITE_OTHER): Payer: Medicare PPO | Admitting: Podiatry

## 2016-07-30 DIAGNOSIS — M7660 Achilles tendinitis, unspecified leg: Secondary | ICD-10-CM | POA: Diagnosis not present

## 2016-07-30 DIAGNOSIS — M722 Plantar fascial fibromatosis: Secondary | ICD-10-CM

## 2016-07-30 DIAGNOSIS — M25579 Pain in unspecified ankle and joints of unspecified foot: Secondary | ICD-10-CM | POA: Diagnosis not present

## 2016-07-30 DIAGNOSIS — M25572 Pain in left ankle and joints of left foot: Secondary | ICD-10-CM

## 2016-07-30 DIAGNOSIS — M25571 Pain in right ankle and joints of right foot: Secondary | ICD-10-CM

## 2016-07-30 NOTE — Progress Notes (Signed)
Subjective:    Patient ID: Cathy Lewis, female   DOB: 71 y.o.   MRN: [id number]   HPI patient presents stating that it she's had a lot of pain in the back of her heels and that she had some pain underneath her right heel that had an injection yesterday that improved it. Also states her left first metatarsals been mildly tender at times with tighter-type shoes    Review of Systems  All other systems reviewed and are negative.       Objective:  Physical Exam  Cardiovascular: Intact distal pulses.   Musculoskeletal: Normal range of motion.  Neurological: She is alert.  Skin: Skin is warm.  Nursing note and vitals reviewed.  neurovascular status intact muscle strength adequate range of motion within normal limits with patient noted to have mild discomfort posterior aspect heel region bilateral but patient states it's not flared up currently with minimal discomfort plantar heel right and mild redness around the first metatarsal left. Patient's found have good digital perfusion and is well oriented 3     Assessment:  Possibility for Achilles tendinitis left over right localized in nature with plantar fasciitis right and mild structural bunion deformity left post surgery done a number years ago       Plan:    H&P and all conditions reviewed with patient. I've recommended at this time wider shoes stretching exercises padding therapy and if symptoms were to get worse she is to reappoint immediately and I will evaluate for other treatment options  X-ray report indicates small spur formation posterior heel region bilateral with plantar heel bilateral

## 2016-07-30 NOTE — Progress Notes (Signed)
   Subjective:    Patient ID: Cathy Lewis, female    DOB: [birth year], 71 y.o.   MRN: [id number]  HPI  Chief Complaint  Patient presents with  . Plantar Fasciitis    Rt foot bottom of the heel and Lt back of the heel are painful.     Review of Systems  All other systems reviewed and are negative.      Objective:   Physical Exam        Assessment & Plan:

## 2016-08-05 ENCOUNTER — Telehealth: Payer: Self-pay | Admitting: *Deleted

## 2016-08-05 NOTE — Telephone Encounter (Addendum)
Pt states she was seen last Wednesday, but her right foot hurts terribly behind the toes. Unable to leave message for pt to call for information, voicemail is not set up.08/06/2016-I spoke with pt and she states the pain is at the ball of her foot at the back of the toes. I told pt the plantar fascia attaches there and just behind the arch and to the heel, that the ball of her foot could be inflamed and in most therapies the injection for the inflammation is given in the heel/medial arch area. Pt states her orthopedic doctor gave her an injection and she informed Dr. Paulla Dolly. I told pt I could transfer her now to get an appt and she could begin icing the foot 3-4 times daily for 15-20 minutes per sessions until seen in office, or she could perform ice therapy and if no improvement call for an appt. Pt states she will perform ice therapy and if no improvement call for an appt.

## 2016-08-21 ENCOUNTER — Ambulatory Visit (HOSPITAL_BASED_OUTPATIENT_CLINIC_OR_DEPARTMENT_OTHER): Payer: Medicare PPO

## 2016-08-21 ENCOUNTER — Other Ambulatory Visit (HOSPITAL_BASED_OUTPATIENT_CLINIC_OR_DEPARTMENT_OTHER): Payer: Medicare PPO

## 2016-08-21 ENCOUNTER — Ambulatory Visit (HOSPITAL_BASED_OUTPATIENT_CLINIC_OR_DEPARTMENT_OTHER): Payer: Medicare PPO | Admitting: Hematology & Oncology

## 2016-08-21 VITALS — BP 109/68 | HR 93 | Temp 98.7°F | Resp 16 | Wt 121.0 lb

## 2016-08-21 DIAGNOSIS — C7951 Secondary malignant neoplasm of bone: Secondary | ICD-10-CM

## 2016-08-21 DIAGNOSIS — C649 Malignant neoplasm of unspecified kidney, except renal pelvis: Secondary | ICD-10-CM

## 2016-08-21 LAB — CBC WITH DIFFERENTIAL (CANCER CENTER ONLY)
BASO#: 0 10*3/uL (ref 0.0–0.2)
BASO%: 0.2 % (ref 0.0–2.0)
EOS ABS: 0.2 10*3/uL (ref 0.0–0.5)
EOS%: 3.3 % (ref 0.0–7.0)
HEMATOCRIT: 35.4 % (ref 34.8–46.6)
HEMOGLOBIN: 11.8 g/dL (ref 11.6–15.9)
LYMPH#: 2.1 10*3/uL (ref 0.9–3.3)
LYMPH%: 45.4 % (ref 14.0–48.0)
MCH: 33.1 pg (ref 26.0–34.0)
MCHC: 33.3 g/dL (ref 32.0–36.0)
MCV: 99 fL (ref 81–101)
MONO#: 0.4 10*3/uL (ref 0.1–0.9)
MONO%: 9.3 % (ref 0.0–13.0)
NEUT%: 41.8 % (ref 39.6–80.0)
NEUTROS ABS: 1.9 10*3/uL (ref 1.5–6.5)
Platelets: 187 10*3/uL (ref 145–400)
RBC: 3.57 10*6/uL — ABNORMAL LOW (ref 3.70–5.32)
RDW: 18.4 % — AB (ref 11.1–15.7)
WBC: 4.6 10*3/uL (ref 3.9–10.0)

## 2016-08-21 LAB — CMP (CANCER CENTER ONLY)
ALBUMIN: 3.3 g/dL (ref 3.3–5.5)
ALK PHOS: 52 U/L (ref 26–84)
ALT(SGPT): 39 U/L (ref 10–47)
AST: 36 U/L (ref 11–38)
BILIRUBIN TOTAL: 0.7 mg/dL (ref 0.20–1.60)
BUN, Bld: 18 mg/dL (ref 7–22)
CALCIUM: 8.7 mg/dL (ref 8.0–10.3)
CHLORIDE: 104 meq/L (ref 98–108)
CO2: 29 meq/L (ref 18–33)
Creat: 1.3 mg/dl — ABNORMAL HIGH (ref 0.6–1.2)
GLUCOSE: 95 mg/dL (ref 73–118)
POTASSIUM: 4.3 meq/L (ref 3.3–4.7)
Sodium: 140 mEq/L (ref 128–145)
Total Protein: 6.4 g/dL (ref 6.4–8.1)

## 2016-08-21 LAB — LACTATE DEHYDROGENASE: LDH: 334 U/L — ABNORMAL HIGH (ref 125–245)

## 2016-08-21 MED ORDER — DENOSUMAB 120 MG/1.7ML ~~LOC~~ SOLN
120.0000 mg | Freq: Once | SUBCUTANEOUS | Status: AC
  Administered 2016-08-21: 120 mg via SUBCUTANEOUS
  Filled 2016-08-21: qty 1.7

## 2016-08-21 NOTE — Patient Instructions (Signed)

## 2016-08-21 NOTE — Progress Notes (Signed)
Hematology and Oncology Follow Up Visit  Cathy Lewis [id number] [birth year] 71 y.o. 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 She really looks good. So far, she is doing quite well. She really has had no complaints. There is no issues with pain. She's had no problems with nausea or vomiting. She is eating well. Her weight is holding steady.  She's had no headache. She's had no visual changes.  She's done very well with the Cabometyx. She's had no leg swelling. She's had no rashes.  She has occasional pain in the left flank. There is no hematuria. She's had no dysuria.  There's been no mouth sores she's had no odynophagia or dysphagia.  Overall, performance status is ECOG 0.  Medications:  Allergies as of 08/21/2016      Reactions   Azithromycin Other (See Comments)   Stomach cramps.  Green black stool    Ciprofloxacin Other (See Comments)   Body aches Chills Heart races   Etodolac Palpitations, Other (See Comments)   Joints hurt   Gnp [acetaminophen] Other (See Comments)   "GRN" (cough syrup) Heart races   Moxifloxacin Palpitations, Other (See Comments)   Body ache   Tramadol Nausea And Vomiting, Other (See Comments)   Dizziness   Rofecoxib Other (See Comments)   Pt unsure       Medication List       Accurate as of 08/21/16  2:44 PM. Always use your most recent med list.          ADVAIR DISKUS 100-50 MCG/DOSE Aepb Generic drug:  Fluticasone-Salmeterol Inhale 1 puff into the lungs 2 (two) times daily before a meal.   albuterol 90 MCG/ACT inhaler Commonly known as:   PROVENTIL,VENTOLIN Inhale 2 puffs into the lungs every 4 (four) hours as needed for wheezing.   amLODipine 5 MG tablet Commonly known as:  NORVASC Take 5 mg by mouth daily.   CABOMETYX 40 MG Tabs Generic drug:  cabozantinib S-Malate TAKE 1 TABLET BY MOUTH DAILY   CALCIUM + D PO Take 1 tablet by mouth daily.   colchicine 0.6 MG tablet Take 1 tablet (0.6 mg total) by mouth daily.   dicyclomine 20 MG tablet Commonly known as:  BENTYL Take 20 mg by mouth 3 (three) times daily as needed (Stomach Cramps).   fentaNYL 12 MCG/HR Commonly known as:  DURAGESIC - dosed mcg/hr Place 12.5 mcg onto the skin every 3 (three) days.   HYDROcodone-acetaminophen 5-325 MG tablet Commonly known as:  NORCO Take 1 tablet by mouth every 6 (six) hours as needed.   LORazepam 0.5 MG tablet Commonly known as:  ATIVAN Take 0.5 mg by mouth every 4 (four) hours as needed for anxiety.   Magnesium 250 MG Tabs Take 250 mg by mouth daily.   meclizine 25 MG tablet Commonly known as:  ANTIVERT Take 12.5 mg by mouth 3 (three) times daily as needed. For dizziness.   metoCLOPramide 10 MG tablet Commonly known as:  REGLAN Take 1 tablet (10 mg total) by mouth every 6 (six) hours as needed for nausea.  montelukast 10 MG tablet Commonly known as:  SINGULAIR Take 10 mg by mouth at bedtime.   nystatin 100000 UNIT/ML suspension Commonly known as:  MYCOSTATIN Take 5 mLs (500,000 Units total) by mouth 4 (four) times daily.   OSTEO BI-FLEX ADV DOUBLE ST PO Take 1 tablet by mouth daily.   pilocarpine 5 MG tablet Commonly known as:  SALAGEN Take 1 tablet (5 mg total) by mouth 2 (two) times daily.   predniSONE 5 MG tablet Commonly known as:  DELTASONE Take 1 tablet (5 mg total) by mouth daily with breakfast.   PROBIOTIC DAILY PO Take 1 tablet by mouth daily.   ranitidine 150 MG tablet Commonly known as:  ZANTAC Take 150 mg by mouth 2 (two) times daily.   vitamin C 500 MG tablet Commonly known as:   ASCORBIC ACID Take 500 mg by mouth daily.   XGEVA 120 MG/1.7ML Soln injection Generic drug:  denosumab Inject 120 mg into the skin every 30 (thirty) days.       Allergies:  Allergies  Allergen Reactions  . Azithromycin Other (See Comments)    Stomach cramps.  Green black stool   . Ciprofloxacin Other (See Comments)    Body aches Chills Heart races  . Etodolac Palpitations and Other (See Comments)    Joints hurt   . Gnp [Acetaminophen] Other (See Comments)    "GRN" (cough syrup) Heart races  . Moxifloxacin Palpitations and Other (See Comments)    Body ache  . Tramadol Nausea And Vomiting and Other (See Comments)    Dizziness  . Rofecoxib Other (See Comments)    Pt unsure     Past Medical History, Surgical history, Social history, and Family History were reviewed and updated.  Review of Systems: All other 10 point review of systems is negative.   Physical Exam:  weight is 121 lb (54.9 kg). Her oral temperature is 98.7 F (37.1 C). Her blood pressure is 109/68 and her pulse is 93. Her respiration is 16 and oxygen saturation is 98%.   Wt Readings from Last 3 Encounters:  08/21/16 121 lb (54.9 kg)  07/22/16 120 lb 12.8 oz (54.8 kg)  06/09/16 121 lb 12.8 oz (55.2 kg)    Thin but well-nourished white female in no obvious distress. Head and neck exam shows no ocular or oral lesions. She has no adenopathy in the neck. Thyroid is nonpalpable. Lungs are clear. Cardiac exam regular rate and rhythm with no murmurs, rubs or bruits. Abdomen is soft and she has good bowel sounds. There is no fluid wave. There is no palpable liver or spleen tip. Back exam shows no tenderness over the spine, ribs or hips. Extremities shows no clubbing, cyanosis or edema. Neurological exam shows no focal neurological deficits. Skin exam shows no rashes, ecchymoses or petechia.  Lab Results  Component Value Date   WBC 4.6 08/21/2016   HGB 11.8 08/21/2016   HCT 35.4 08/21/2016   MCV 99 08/21/2016     PLT 187 08/21/2016   No results found for: FERRITIN, IRON, TIBC, UIBC, IRONPCTSAT Lab Results  Component Value Date   RBC 3.57 (L) 08/21/2016   No results found for: KPAFRELGTCHN, LAMBDASER, KAPLAMBRATIO No results found for: IGGSERUM, IGA, IGMSERUM No results found for: Ronnald Ramp, A1GS, A2GS, Violet Baldy, MSPIKE, SPEI   Chemistry      Component Value Date/Time   NA 140 08/21/2016 1343   NA 137 03/04/2016 1043   K 4.3 08/21/2016 1343   K 4.1 03/04/2016  1043   CL 104 08/21/2016 1343   CO2 29 08/21/2016 1343   CO2 22 03/04/2016 1043   BUN 18 08/21/2016 1343   BUN 14.6 03/04/2016 1043   CREATININE 1.3 (H) 08/21/2016 1343   CREATININE 1.3 (H) 03/04/2016 1043   GLU 104 07/20/2008      Component Value Date/Time   CALCIUM 8.7 08/21/2016 1343   CALCIUM 9.4 03/04/2016 1043   ALKPHOS 52 08/21/2016 1343   ALKPHOS 77 03/04/2016 1043   AST 36 08/21/2016 1343   AST 22 03/04/2016 1043   ALT 39 08/21/2016 1343   ALT 20 03/04/2016 1043   BILITOT 0.70 08/21/2016 1343   BILITOT 0.35 03/04/2016 1043     Impression and Plan: Ms. Oka is a very pleasant 71 yo white female with metastatic clear cell renal carcinoma diagnosed in 2013. She is having a nice response on Cabometyx and verbalized that she is taking 40 mg PO every other day as prescribed.   We will go ahead and set her scans up for 3 weeks. Her last scans were done back in April.  Hopefully, we will find that the scans look good. They do, we will move her Delton See out to every 2 months. She would be happy with that.   I will see her back in another for 5 weeks.    Volanda Napoleon, MD 6/28/20182:44 PM

## 2016-09-01 ENCOUNTER — Other Ambulatory Visit: Payer: Self-pay | Admitting: Hematology & Oncology

## 2016-09-01 DIAGNOSIS — C641 Malignant neoplasm of right kidney, except renal pelvis: Secondary | ICD-10-CM

## 2016-09-03 ENCOUNTER — Telehealth: Payer: Self-pay | Admitting: *Deleted

## 2016-09-03 MED ORDER — PROMETHAZINE HCL 12.5 MG PO TABS: 12.5000 mg | ORAL_TABLET | Freq: Four times a day (QID) | ORAL | 0 refills | Status: AC | PRN

## 2016-09-03 NOTE — Telephone Encounter (Signed)
[name] nausea. She's not eating as much because she always feels sick on her stomach. She has reglan at home but it's not working.  Reviewed with Dr Marin Olp. He would like patient to try phenergan.  Patient aware of new prescription. Pharmacy confirmed. Also educated patient to watch for sedation with the medication and not to drive after taking. She understood.

## 2016-09-13 ENCOUNTER — Emergency Department (HOSPITAL_BASED_OUTPATIENT_CLINIC_OR_DEPARTMENT_OTHER)
Admission: EM | Admit: 2016-09-13 | Discharge: 2016-09-13 | Disposition: A | Discharge status: Home / Self Care | Payer: Medicare PPO | Attending: Emergency Medicine | Admitting: Emergency Medicine

## 2016-09-13 ENCOUNTER — Encounter (HOSPITAL_BASED_OUTPATIENT_CLINIC_OR_DEPARTMENT_OTHER): Payer: Self-pay

## 2016-09-13 ENCOUNTER — Emergency Department (HOSPITAL_BASED_OUTPATIENT_CLINIC_OR_DEPARTMENT_OTHER): Payer: Medicare PPO

## 2016-09-13 DIAGNOSIS — Z85528 Personal history of other malignant neoplasm of kidney: Secondary | ICD-10-CM | POA: Diagnosis not present

## 2016-09-13 DIAGNOSIS — R1013 Epigastric pain: Secondary | ICD-10-CM | POA: Diagnosis present

## 2016-09-13 DIAGNOSIS — K2900 Acute gastritis without bleeding: Secondary | ICD-10-CM | POA: Diagnosis not present

## 2016-09-13 DIAGNOSIS — Z79899 Other long term (current) drug therapy: Secondary | ICD-10-CM | POA: Diagnosis not present

## 2016-09-13 DIAGNOSIS — J45909 Unspecified asthma, uncomplicated: Secondary | ICD-10-CM | POA: Insufficient documentation

## 2016-09-13 DIAGNOSIS — Z96651 Presence of right artificial knee joint: Secondary | ICD-10-CM | POA: Insufficient documentation

## 2016-09-13 DIAGNOSIS — R109 Unspecified abdominal pain: Secondary | ICD-10-CM

## 2016-09-13 DIAGNOSIS — I10 Essential (primary) hypertension: Secondary | ICD-10-CM | POA: Diagnosis not present

## 2016-09-13 DIAGNOSIS — Z87891 Personal history of nicotine dependence: Secondary | ICD-10-CM | POA: Insufficient documentation

## 2016-09-13 LAB — COMPREHENSIVE METABOLIC PANEL
ALT: 36 U/L (ref 14–54)
ANION GAP: 9 (ref 5–15)
AST: 37 U/L (ref 15–41)
Albumin: 4 g/dL (ref 3.5–5.0)
Alkaline Phosphatase: 51 U/L (ref 38–126)
BUN: 12 mg/dL (ref 6–20)
CHLORIDE: 102 mmol/L (ref 101–111)
CO2: 23 mmol/L (ref 22–32)
Calcium: 8.7 mg/dL — ABNORMAL LOW (ref 8.9–10.3)
Creatinine, Ser: 1.09 mg/dL — ABNORMAL HIGH (ref 0.44–1.00)
GFR calc non Af Amer: 49 mL/min — ABNORMAL LOW (ref >60)
GFR, EST AFRICAN AMERICAN: 57 mL/min — AB (ref >60)
Glucose, Bld: 96 mg/dL (ref 65–99)
POTASSIUM: 4.3 mmol/L (ref 3.5–5.1)
SODIUM: 134 mmol/L — AB (ref 135–145)
Total Bilirubin: 0.6 mg/dL (ref 0.3–1.2)
Total Protein: 6.9 g/dL (ref 6.5–8.1)

## 2016-09-13 LAB — URINALYSIS, ROUTINE W REFLEX MICROSCOPIC
BILIRUBIN URINE: NEGATIVE
Glucose, UA: NEGATIVE mg/dL
Hgb urine dipstick: NEGATIVE
Ketones, ur: 15 mg/dL — AB
NITRITE: NEGATIVE
Protein, ur: NEGATIVE mg/dL
SPECIFIC GRAVITY, URINE: 1.03 (ref 1.005–1.030)
pH: 5 (ref 5.0–8.0)

## 2016-09-13 LAB — CBC WITH DIFFERENTIAL/PLATELET
Basophils Absolute: 0 10*3/uL (ref 0.0–0.1)
Basophils Relative: 0 %
EOS ABS: 0.3 10*3/uL (ref 0.0–0.7)
EOS PCT: 7 %
HCT: 37.4 % (ref 36.0–46.0)
Hemoglobin: 12.3 g/dL (ref 12.0–15.0)
LYMPHS ABS: 1.7 10*3/uL (ref 0.7–4.0)
Lymphocytes Relative: 37 %
MCH: 31.6 pg (ref 26.0–34.0)
MCHC: 32.9 g/dL (ref 30.0–36.0)
MCV: 96.1 fL (ref 78.0–100.0)
MONOS PCT: 9 %
Monocytes Absolute: 0.4 10*3/uL (ref 0.1–1.0)
Neutro Abs: 2.2 10*3/uL (ref 1.7–7.7)
Neutrophils Relative %: 47 %
PLATELETS: 193 10*3/uL (ref 150–400)
RBC: 3.89 MIL/uL (ref 3.87–5.11)
RDW: 17.9 % — ABNORMAL HIGH (ref 11.5–15.5)
WBC: 4.6 10*3/uL (ref 4.0–10.5)

## 2016-09-13 LAB — URINALYSIS, MICROSCOPIC (REFLEX)

## 2016-09-13 LAB — LIPASE, BLOOD: Lipase: 33 U/L (ref 11–51)

## 2016-09-13 MED ORDER — SUCRALFATE 1 GM/10ML PO SUSP: 1.0000 g | Freq: Three times a day (TID) | ORAL | 0 refills | Status: AC

## 2016-09-13 MED ORDER — SODIUM CHLORIDE 0.9 % IV BOLUS (SEPSIS)
1000.0000 mL | Freq: Once | INTRAVENOUS | Status: AC
  Administered 2016-09-13: 1000 mL via INTRAVENOUS

## 2016-09-13 MED ORDER — CEPHALEXIN 500 MG PO CAPS: 500.0000 mg | ORAL_CAPSULE | Freq: Three times a day (TID) | ORAL | 0 refills | Status: DC

## 2016-09-13 MED ORDER — MORPHINE SULFATE (PF) 4 MG/ML IV SOLN
4.0000 mg | Freq: Once | INTRAVENOUS | Status: AC
  Administered 2016-09-13: 4 mg via INTRAVENOUS
  Filled 2016-09-13: qty 1

## 2016-09-13 MED ORDER — METOCLOPRAMIDE HCL 5 MG/ML IJ SOLN
10.0000 mg | Freq: Once | INTRAMUSCULAR | Status: AC
  Administered 2016-09-13: 10 mg via INTRAVENOUS
  Filled 2016-09-13: qty 2

## 2016-09-13 MED ORDER — METOCLOPRAMIDE HCL 10 MG PO TABS: 10.0000 mg | ORAL_TABLET | Freq: Three times a day (TID) | ORAL | 0 refills | Status: AC | PRN

## 2016-09-13 MED ORDER — DEXTROSE 5 % IV SOLN
1.0000 g | Freq: Once | INTRAVENOUS | Status: AC
  Administered 2016-09-13: 1 g via INTRAVENOUS
  Filled 2016-09-13: qty 10

## 2016-09-13 MED ORDER — RANITIDINE HCL 150 MG PO TABS: 150.0000 mg | ORAL_TABLET | Freq: Two times a day (BID) | ORAL | 0 refills | Status: DC

## 2016-09-13 MED ORDER — CEPHALEXIN 250 MG PO CAPS: 500.0000 mg | ORAL_CAPSULE | Freq: Three times a day (TID) | ORAL | Status: DC

## 2016-09-13 MED ORDER — GI COCKTAIL ~~LOC~~
30.0000 mL | Freq: Once | ORAL | Status: AC
  Administered 2016-09-13: 30 mL via ORAL
  Filled 2016-09-13: qty 30

## 2016-09-13 MED ORDER — ONDANSETRON HCL 4 MG/2ML IJ SOLN
4.0000 mg | Freq: Once | INTRAMUSCULAR | Status: AC
  Administered 2016-09-13: 4 mg via INTRAVENOUS
  Filled 2016-09-13: qty 2

## 2016-09-13 NOTE — ED Notes (Signed)
Pt on cardiac monitor and auto VS 

## 2016-09-13 NOTE — ED Provider Notes (Signed)
[facility] Provider Note   CSN: [id number] Arrival date & time: 09/13/16  1214     History   Chief Complaint Chief Complaint  Patient presents with  . Abdominal Pain    HPI Cathy Lewis is a 71 y.o. female.  HPI  71 yo F with PMHx as below including known metastatic renal cell CA here with abdominal pain. On oral chemo. Pt states that for the past several days, she has had aching, throbbing epigastric pain. The pain is worse with eating and she feels a bloating sensation in her abdomen followed by early satiety. She went to her PCP who took an XRay and toler her she was constipated, so she took laxatives and ahs since has multiple BMs. Over the past 24 hours, however, her pain has worsened. Pain is worse with eating. No alleviating factors. No urinary sx. No fevers.  Past Medical History:  Diagnosis Date  . Arthritis    arthritis in knees and back   . Asthma   . Atypical chest pain    NORMAL CARDIOLITE STUDY   . Complication of anesthesia    hard to wake up from umb hernia-was given alot of meds  . Depression   . Dyslipidemia   . GERD (gastroesophageal reflux disease)   . Headache   . Hypertension   . 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 Seasonal allergies   . Syncope    HISTORY OF    Patient Active Problem List   Diagnosis Date Noted  . Presbyopia of both eyes 03/17/2016  . GERD (gastroesophageal reflux disease) 10/09/2015  . Cough variant asthma 05/02/2015  . Metastatic renal cell carcinoma to bone ([place]) 11/20/2014  . Malignant neoplasm metastatic to bone ([place]) 08/29/2014  . H/O therapeutic radiation 08/29/2014  . Carcinoma of kidney ([place]) 08/29/2014  . LBP (low back pain) 08/29/2014  . Atypical chest pain   . Dyslipidemia   . Hypertension   . Syncope     Past Surgical History:  Procedure Laterality Date  . ABDOMINAL HYSTERECTOMY  2004   rectocele  . APPENDECTOMY    . BUNIONECTOMY Left 01-11-2013   left foot  .  BUNIONECTOMY Right 12-31-2006   with hammer toe  . CARPAL TUNNEL RELEASE     R  . CHOLECYSTECTOMY N/A 08/27/2015   Procedure: LAPAROSCOPIC CHOLECYSTECTOMY WITH INTRAOPERATIVE CHOLANGIOGRAM;  Surgeon: [name], MD;  Location: [facility];  Service: General;  Laterality: N/A;  . COLONOSCOPY    . CYSTOCELE REPAIR    . DILATION AND CURETTAGE OF UTERUS    . HERNIA REPAIR  2014    umb, right and left  . INGUINAL HERNIA REPAIR Left 01/03/2014   Procedure: LEFT INGUINAL HERNIA REPAIR ;  Surgeon: [name], MD;  Location: [facility];  Service: General;  Laterality: Left;  . INGUINAL HERNIA REPAIR Right 04/06/2015   Procedure: HERNIA REPAIR RIGHT  INGUINAL ADULT WITH MESH;  Surgeon: [name], MD;  Location: [facility];  Service: General;  Laterality: Right;  . INSERTION OF MESH Left 01/03/2014   Procedure: INSERTION OF MESH;  Surgeon: [name], MD;  Location: [facility];  Service: General;  Laterality: Left;  . INSERTION OF MESH Right 04/06/2015   Procedure: INSERTION OF MESH;  Surgeon: [name], MD;  Location: [facility];  Service: General;  Laterality: Right;  . IR GENERIC HISTORICAL  07/03/2015   IR RADIOLOGIST EVAL & MGMT 07/03/2015 Marybelle Killings, MD [facility] INTERV RAD  .  IR GENERIC HISTORICAL  05/24/2015   IR RADIOLOGIST EVAL & MGMT 05/24/2015 Marybelle Killings, MD [facility] INTERV RAD  . JOINT REPLACEMENT    . 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 OTHER SURGICAL HISTORY     bladder vault prolapse  . OTHER SURGICAL HISTORY     right foot bunion and hammer toe surgery   . OTHER SURGICAL HISTORY     right knee arthroscopic   . OTHER SURGICAL HISTORY     right arm ( underarm ) surgery removed ? cyst   . TOTAL KNEE ARTHROPLASTY  03/11/2011   Procedure: TOTAL KNEE ARTHROPLASTY;  Surgeon: Cynda Familia, MD;  Location: [facility];  Service: Orthopedics;  Laterality: Right;  . TUBAL LIGATION     1977    OB History    No data available       Home  Medications    Prior to Admission medications   Medication Sig Start Date End Date Taking? Authorizing Provider  albuterol (PROVENTIL,VENTOLIN) 90 MCG/ACT inhaler Inhale 2 puffs into the lungs every 4 (four) hours as needed for wheezing.     [provider]  amLODipine (NORVASC) 5 MG tablet Take 5 mg by mouth daily.  07/28/15   [provider]  CABOMETYX 40 MG TABS TAKE 1 TABLET BY MOUTH DAILY Patient taking differently: TAKE 1 TABLET BY MOUTH EVERY OTHER DAY 03/31/16   Volanda Napoleon, MD  Calcium Carbonate-Vitamin D (CALCIUM + D PO) Take 1 tablet by mouth daily.     [provider]  cephALEXin (KEFLEX) 500 MG capsule Take 1 capsule (500 mg total) by mouth 3 (three) times daily. 09/13/16 09/18/16  Duffy Bruce, MD  colchicine 0.6 MG tablet Take 1 tablet (0.6 mg total) by mouth daily. Patient not taking: Reported on 04/28/2016 02/08/16   [place], Holli Humbles, NP  denosumab (XGEVA) 120 MG/1.7ML SOLN injection Inject 120 mg into the skin every 30 (thirty) days.    [provider]  dicyclomine (BENTYL) 20 MG tablet Take 20 mg by mouth 3 (three) times daily as needed (Stomach Cramps).    [provider]  fentaNYL (DURAGESIC - DOSED MCG/HR) 12 MCG/HR Place 12.5 mcg onto the skin every 3 (three) days.    [provider]  Fluticasone-Salmeterol (ADVAIR DISKUS) 100-50 MCG/DOSE AEPB Inhale 1 puff into the lungs 2 (two) times daily before a meal.    [provider]  HYDROcodone-acetaminophen (NORCO) 5-325 MG tablet Take 1 tablet by mouth every 6 (six) hours as needed. 03/01/16   Orlie Dakin, MD  LORazepam (ATIVAN) 0.5 MG tablet Take 0.5 mg by mouth every 4 (four) hours as needed for anxiety.    Hassie Bruce, OTR  Magnesium 250 MG TABS Take 250 mg by mouth daily.    [provider]  meclizine (ANTIVERT) 25 MG tablet Take 12.5 mg by mouth 3 (three) times daily as needed. For dizziness.    [provider]  metoCLOPramide  (REGLAN) 10 MG tablet Take 1 tablet (10 mg total) by mouth every 8 (eight) hours as needed for nausea or vomiting. 09/13/16   Duffy Bruce, MD  Misc Natural Products (OSTEO BI-FLEX ADV DOUBLE ST PO) Take 1 tablet by mouth daily.    [provider]  montelukast (SINGULAIR) 10 MG tablet Take 10 mg by mouth at bedtime.     [provider]  nystatin (MYCOSTATIN) 100000 UNIT/ML suspension Take 5 mLs (500,000 Units total) by mouth 4 (four) times daily. 09/25/15  [place], Holli Humbles, NP  pilocarpine (SALAGEN) 5 MG tablet Take 1 tablet (5 mg total) by mouth 2 (two) times daily. 10/02/15   [place], Holli Humbles, NP  predniSONE (DELTASONE) 5 MG tablet Take 1 tablet (5 mg total) by mouth daily with breakfast. 03/31/16   [place], Holli Humbles, NP  Probiotic Product (PROBIOTIC DAILY PO) Take 1 tablet by mouth daily.     [provider]  promethazine (PHENERGAN) 12.5 MG tablet Take 1 tablet (12.5 mg total) by mouth every 6 (six) hours as needed for nausea or vomiting. 09/03/16   Volanda Napoleon, MD  ranitidine (ZANTAC) 150 MG tablet Take 1 tablet (150 mg total) by mouth 2 (two) times daily. 09/13/16 09/23/16  Duffy Bruce, MD  sucralfate (CARAFATE) 1 GM/10ML suspension Take 10 mLs (1 g total) by mouth 4 (four) times daily -  with meals and at bedtime. 09/13/16   Duffy Bruce, MD  vitamin C (ASCORBIC ACID) 500 MG tablet Take 500 mg by mouth daily.    [provider]    Family History Family History  Problem Relation Age of Onset  . Heart attack Father        DECEASED  . Cancer Mother        DECEASED-unsure of type  . Cancer Brother        LUNG CA- smoked  . Aneurysm Brother        AAA  . Asthma Sister     Social History Social History  Substance Use Topics  . Smoking status: Former Smoker    Packs/day: 0.25    Years: 10.00    Quit date: 02/24/1985  . Smokeless tobacco: Never Used  . Alcohol use No     Allergies   Azithromycin; Ciprofloxacin; Etodolac; Gnp  [acetaminophen]; Moxifloxacin; Tramadol; and Rofecoxib   Review of Systems Review of Systems  Constitutional: Positive for fatigue.  Gastrointestinal: Positive for abdominal pain, nausea and vomiting.  All other systems reviewed and are negative.    Physical Exam Updated Vital Signs BP 127/80 (BP Location: Left Arm)   Pulse 91   Resp 16   SpO2 97%   Physical Exam  Constitutional: She is oriented to person, place, and time. She appears well-developed and well-nourished. No distress.  HENT:  Head: Normocephalic and atraumatic.  Eyes: Conjunctivae are normal.  Neck: Neck supple.  Cardiovascular: Normal rate, regular rhythm and normal heart sounds.  Exam reveals no friction rub.   No murmur heard. Pulmonary/Chest: Effort normal and breath sounds normal. No respiratory distress. She has no wheezes. She has no rales.  Abdominal: She exhibits no distension. There is tenderness in the epigastric area and periumbilical area. There is no rigidity and no rebound.  Musculoskeletal: She exhibits no edema.  Neurological: She is alert and oriented to person, place, and time. She exhibits normal muscle tone.  Skin: Skin is warm. Capillary refill takes less than 2 seconds.  Psychiatric: She has a normal mood and affect.  Nursing note and vitals reviewed.    ED Treatments / Results  Labs (all labs ordered are listed, but only abnormal results are displayed) Labs Reviewed  CBC WITH DIFFERENTIAL/PLATELET - Abnormal; Notable for the following:       Result Value   RDW 17.9 (*)    All other components within normal limits  COMPREHENSIVE METABOLIC PANEL - Abnormal; Notable for the following:    Sodium 134 (*)    Creatinine, Ser 1.09 (*)    Calcium 8.7 (*)  GFR calc non Af Amer 49 (*)    GFR calc Af Amer 57 (*)    All other components within normal limits  URINALYSIS, ROUTINE W REFLEX MICROSCOPIC - Abnormal; Notable for the following:    APPearance CLOUDY (*)    Ketones, ur 15 (*)     Leukocytes, UA MODERATE (*)    All other components within normal limits  URINALYSIS, MICROSCOPIC (REFLEX) - Abnormal; Notable for the following:    Bacteria, UA FEW (*)    Squamous Epithelial / LPF 6-30 (*)    All other components within normal limits  URINE CULTURE  LIPASE, BLOOD    EKG Normal sinus rhythm, VR 83. Normal intervals. Non-specific ST-t changes. No significant change from prior.   Radiology Ct Abdomen Pelvis Wo Contrast  Result Date: 09/13/2016 CLINICAL DATA:  Abdominal pain and bloating 1-2 weeks. History of metastatic renal cell carcinoma. EXAM: CT ABDOMEN AND PELVIS WITHOUT CONTRAST TECHNIQUE: Multidetector CT imaging of the abdomen and pelvis was performed following the standard protocol without IV contrast. COMPARISON:  09/11/2016 FINDINGS: Lower chest: Multiple small nodules over the lung bases unchanged compatible known metastatic disease. Small hiatal hernia. Hepatobiliary: Previous cholecystectomy. Stable hypodense mass over the far lateral aspect of the left lobe of the liver abutting the lesser curvature of the stomach. Pancreas: Within normal. Spleen: Within normal. Adrenals/Urinary Tract: Bilateral renal masses unchanged compatible with metastatic disease. Evidence of previous left nephrectomy. Right kidney is normal in size without hydronephrosis or nephrolithiasis. Suggestion of a couple small right renal cysts unchanged. Right ureter and bladder are normal. Stomach/Bowel: Small hiatal hernia. Small bowel is within normal. Previous appendectomy. Colon is within normal. Vascular/Lymphatic: Mild-to-moderate calcified plaque over the abdominal aorta and iliac arteries. Few small periaortic lymph nodes unchanged. Reproductive: Previous hysterectomy.  Ovaries not visualized. Other: No free fluid or inflammatory change. Musculoskeletal: Known stable metastatic disease throughout the bony structures predominately including the spine and sternum. Stable T12 compression fracture  post kyphoplasty. IMPRESSION: No evidence of bowel obstruction. No acute findings in the abdomen/pelvis. No significant change in patient's known metastatic renal cell carcinoma with involvement of lung, liver, adrenal glands and bones. Stable moderate pathologic T12 compression fracture post kyphoplasty. Few small bilateral renal cysts unchanged. Aortic Atherosclerosis (ICD10-I70.0). Electronically Signed   By: Marin Olp M.D.   On: 09/13/2016 16:18    Procedures Procedures (including critical care time)  Medications Ordered in ED Medications  morphine 4 MG/ML injection 4 mg (4 mg Intravenous Given 09/13/16 1541)  ondansetron (ZOFRAN) injection 4 mg (4 mg Intravenous Given 09/13/16 1540)  sodium chloride 0.9 % bolus 1,000 mL (0 mLs Intravenous Stopped 09/13/16 1630)  metoCLOPramide (REGLAN) injection 10 mg (10 mg Intravenous Given 09/13/16 1737)  gi cocktail (Maalox,Lidocaine,Donnatal) (30 mLs Oral Given 09/13/16 1736)  cefTRIAXone (ROCEPHIN) 1 g in dextrose 5 % 50 mL IVPB (0 g Intravenous Stopped 09/13/16 1833)     Initial Impression / Assessment and Plan / ED Course  I have reviewed the triage vital signs and the nursing notes.  Pertinent labs & imaging results that were available during my care of the patient were reviewed by me and considered in my medical decision making (see chart for details).     71 yo F with PMhx as above here with epigastric pain, nausea, vomiting with early satiety. Exam is as above. Lab work is overall very reassuring, with normal WBC, baseline renal function, normal LFTs and bili. UA with midl pyuria but no CVAT or s/s pyelonephritis.  I suspect pt has gastritis/GERD versus gastroparesis 2/2 chemo use. However, given her TTP and h/o metastatic CA, will check CT.  CT negative. Pt feels much improved after fluids, antiemetics. Will tx for UTI, start reglan for nausea and have her f/u with her Oncologist. Will start antacids for possible gastritis. Return precautions  given.  Final Clinical Impressions(s) / ED Diagnoses   Final diagnoses:  Epigastric pain  Acute superficial gastritis without hemorrhage    New Prescriptions Discharge Medication List as of 09/13/2016  4:58 PM    START taking these medications   Details  sucralfate (CARAFATE) 1 GM/10ML suspension Take 10 mLs (1 g total) by mouth 4 (four) times daily -  with meals and at bedtime., Starting Sat 09/13/2016, Print         Duffy Bruce, MD 09/13/16 2118

## 2016-09-13 NOTE — ED Notes (Signed)
Drinking CT contrast.

## 2016-09-13 NOTE — ED Provider Notes (Addendum)
  Physical Exam  BP (!) 151/85   Pulse 93   Resp 15   SpO2 100%   Physical Exam  ED Course  Procedures  MDM  Pt comes in with cc of abd pain and early satiety. She has a normal workup thus far, besides UA. CT scan is neg. PO challenge started. D/C when ready.   ED ECG REPORT   Date: 09/13/2016  Rate: 86  Rhythm: normal sinus rhythm  QRS Axis: normal  Intervals: normal  ST/T Wave abnormalities: nonspecific ST/T changes  Conduction Disutrbances:none  Narrative Interpretation:   Old EKG Reviewed: none available and unchanged  I have personally reviewed the EKG tracing and agree with the computerized printout as noted.   6:24 PM Pt reassessed. Pt's VSS and WNL. Pt's cap refill < 3 seconds. Pt has been hydrated in the ER and now passed po challenge. We will discharge with antiemetic. Strict ER return precautions have been discussed and pt will return if he is unable to tolerate fluids and symptoms are getting worse.       Varney Biles, MD 09/13/16 [phone]

## 2016-09-13 NOTE — ED Triage Notes (Signed)
Pt c/o abd pain, bloating, and burping that started 1.5 wks. Pt states she has seen her primary care. Pt states they did an XR and told her she has "bile" backing up.  Symptoms worse today. Pt denies N/V/D.

## 2016-09-13 NOTE — ED Notes (Signed)
Patient did not want anything to drink.

## 2016-09-13 NOTE — ED Notes (Signed)
Patient transported to CT 

## 2016-09-15 ENCOUNTER — Encounter: Payer: Self-pay | Admitting: Hematology & Oncology

## 2016-09-15 ENCOUNTER — Telehealth: Payer: Self-pay | Admitting: *Deleted

## 2016-09-15 NOTE — Telephone Encounter (Signed)
[name] abdominal pain. She went to the ED with abdominal pain over the weekend. CT was negative. The provider felt like the pain may be r/t the oral chemotherapy.   Patient is calling her today to follow up. Reviewed with Dr Marin Olp. He would like the patient to stop oral chemotherapy to see if symptoms resolve. He also wants patient to continue taking the Zantac and Carafate.   Reviewed all Dr Antonieta Pert instruction with patient. She confirmed teaching with teach back. She will call us back in a few days if her symptoms don't start to resolve.

## 2016-09-16 LAB — URINE CULTURE
Culture: 80000 — AB
Special Requests: NORMAL

## 2016-09-17 ENCOUNTER — Telehealth: Payer: Self-pay | Admitting: Emergency Medicine

## 2016-09-17 NOTE — Telephone Encounter (Signed)
Post ED Visit - Positive Culture Follow-up  Culture report reviewed by antimicrobial stewardship pharmacist:  []  Elenor Quinones, Pharm.D. []  Heide Guile, Pharm.D., BCPS AQ-ID []  Parks Neptune, Pharm.D., BCPS []  Alycia Rossetti, Pharm.D., BCPS []  [place], Pharm.D., BCPS, AAHIVP []  Legrand Como, Pharm.D., BCPS, AAHIVP []  Salome Arnt, PharmD, BCPS []  Dimitri Ped, PharmD, BCPS []  Vincenza Hews, PharmD, BCPS Jimmy Footman PharmD  Positive urine culture Treated with cephalexin, organism sensitive to the same and no further patient follow-up is required at this time.  Hazle Nordmann 09/17/2016, 10:49 AM

## 2016-09-18 ENCOUNTER — Other Ambulatory Visit (HOSPITAL_BASED_OUTPATIENT_CLINIC_OR_DEPARTMENT_OTHER): Payer: Medicare PPO

## 2016-09-18 ENCOUNTER — Ambulatory Visit: Payer: Medicare PPO

## 2016-09-18 ENCOUNTER — Ambulatory Visit (HOSPITAL_BASED_OUTPATIENT_CLINIC_OR_DEPARTMENT_OTHER): Payer: Medicare PPO | Admitting: Hematology & Oncology

## 2016-09-18 ENCOUNTER — Ambulatory Visit (HOSPITAL_BASED_OUTPATIENT_CLINIC_OR_DEPARTMENT_OTHER): Payer: Medicare PPO

## 2016-09-18 VITALS — BP 117/61 | HR 97

## 2016-09-18 VITALS — BP 114/63 | HR 91 | Temp 97.8°F | Resp 17 | Wt 119.6 lb

## 2016-09-18 DIAGNOSIS — R599 Enlarged lymph nodes, unspecified: Secondary | ICD-10-CM

## 2016-09-18 DIAGNOSIS — R109 Unspecified abdominal pain: Secondary | ICD-10-CM

## 2016-09-18 DIAGNOSIS — R911 Solitary pulmonary nodule: Secondary | ICD-10-CM | POA: Diagnosis not present

## 2016-09-18 DIAGNOSIS — C7951 Secondary malignant neoplasm of bone: Secondary | ICD-10-CM | POA: Diagnosis not present

## 2016-09-18 DIAGNOSIS — C649 Malignant neoplasm of unspecified kidney, except renal pelvis: Secondary | ICD-10-CM

## 2016-09-18 DIAGNOSIS — K580 Irritable bowel syndrome with diarrhea: Secondary | ICD-10-CM

## 2016-09-18 LAB — CBC WITH DIFFERENTIAL (CANCER CENTER ONLY)
BASO#: 0 10*3/uL (ref 0.0–0.2)
BASO%: 0.5 % (ref 0.0–2.0)
EOS ABS: 0.3 10*3/uL (ref 0.0–0.5)
EOS%: 6.8 % (ref 0.0–7.0)
HEMATOCRIT: 39.6 % (ref 34.8–46.6)
HEMOGLOBIN: 12.9 g/dL (ref 11.6–15.9)
LYMPH#: 1.3 10*3/uL (ref 0.9–3.3)
LYMPH%: 33.1 % (ref 14.0–48.0)
MCH: 31.9 pg (ref 26.0–34.0)
MCHC: 32.6 g/dL (ref 32.0–36.0)
MCV: 98 fL (ref 81–101)
MONO#: 0.4 10*3/uL (ref 0.1–0.9)
MONO%: 9.5 % (ref 0.0–13.0)
NEUT%: 50.1 % (ref 39.6–80.0)
NEUTROS ABS: 2 10*3/uL (ref 1.5–6.5)
Platelets: 201 10*3/uL (ref 145–400)
RBC: 4.04 10*6/uL (ref 3.70–5.32)
RDW: 17.7 % — AB (ref 11.1–15.7)
WBC: 4 10*3/uL (ref 3.9–10.0)

## 2016-09-18 LAB — CMP (CANCER CENTER ONLY)
ALBUMIN: 3.7 g/dL (ref 3.3–5.5)
ALT(SGPT): 32 U/L (ref 10–47)
AST: 34 U/L (ref 11–38)
Alkaline Phosphatase: 59 U/L (ref 26–84)
BUN, Bld: 10 mg/dL (ref 7–22)
CALCIUM: 8.7 mg/dL (ref 8.0–10.3)
CHLORIDE: 104 meq/L (ref 98–108)
CO2: 25 meq/L (ref 18–33)
Creat: 1.4 mg/dl — ABNORMAL HIGH (ref 0.6–1.2)
GLUCOSE: 97 mg/dL (ref 73–118)
Potassium: 4.8 mEq/L — ABNORMAL HIGH (ref 3.3–4.7)
SODIUM: 136 meq/L (ref 128–145)
Total Bilirubin: 1 mg/dl (ref 0.20–1.60)
Total Protein: 6.9 g/dL (ref 6.4–8.1)

## 2016-09-18 LAB — LACTATE DEHYDROGENASE: LDH: 301 U/L — ABNORMAL HIGH (ref 125–245)

## 2016-09-18 MED ORDER — FAMOTIDINE IN NACL 20-0.9 MG/50ML-% IV SOLN
20.0000 mg | Freq: Two times a day (BID) | INTRAVENOUS | Status: DC
  Administered 2016-09-18: 20 mg via INTRAVENOUS

## 2016-09-18 MED ORDER — DIAZEPAM 5 MG/ML IJ SOLN
INTRAMUSCULAR | Status: AC
  Filled 2016-09-18: qty 2

## 2016-09-18 MED ORDER — PB-HYOSCY-ATROPINE-SCOPOLAMINE 16.2 MG/5ML PO ELIX: 5.0000 mL | ORAL_SOLUTION | Freq: Three times a day (TID) | ORAL | 0 refills | Status: DC

## 2016-09-18 MED ORDER — SODIUM CHLORIDE 0.9 % IV SOLN
Freq: Once | INTRAVENOUS | Status: AC
  Administered 2016-09-18: 16:00:00 via INTRAVENOUS

## 2016-09-18 MED ORDER — DIAZEPAM 5 MG/ML IJ SOLN
2.5000 mg | Freq: Once | INTRAMUSCULAR | Status: AC
  Administered 2016-09-18: 2.5 mg via INTRAVENOUS

## 2016-09-18 MED ORDER — FAMOTIDINE IN NACL 20-0.9 MG/50ML-% IV SOLN
INTRAVENOUS | Status: AC
  Filled 2016-09-18: qty 100

## 2016-09-18 MED ORDER — DENOSUMAB 120 MG/1.7ML ~~LOC~~ SOLN
120.0000 mg | Freq: Once | SUBCUTANEOUS | Status: AC
  Administered 2016-09-18: 120 mg via SUBCUTANEOUS
  Filled 2016-09-18: qty 1.7

## 2016-09-18 MED ORDER — FAMOTIDINE IN NACL 20-0.9 MG/50ML-% IV SOLN
20.0000 mg | Freq: Two times a day (BID) | INTRAVENOUS | Status: DC
Start: 2016-09-18 — End: 2016-09-18
  Administered 2016-09-18: 20 mg via INTRAVENOUS

## 2016-09-18 NOTE — Patient Instructions (Signed)
Dehydration, Adult Dehydration is a condition in which there is not enough fluid or water in the body. This happens when you lose more fluids than you take in. Important organs, such as the kidneys, brain, and heart, cannot function without a proper amount of fluids. Any loss of fluids from the body can lead to dehydration. Dehydration can range from mild to severe. This condition should be treated right away to prevent it from becoming severe. What are the causes? This condition may be caused by:  Vomiting.  Diarrhea.  Excessive sweating, such as from heat exposure or exercise.  Not drinking enough fluid, especially: ? When ill. ? While doing activity that requires a lot of energy.  Excessive urination.  Fever.  Infection.  Certain medicines, such as medicines that cause the body to lose excess fluid (diuretics).  Inability to access safe drinking water.  Reduced physical ability to get adequate water and food.  What increases the risk? This condition is more likely to develop in people:  Who have a poorly controlled long-term (chronic) illness, such as diabetes, heart disease, or kidney disease.  Who are age 65 or older.  Who are disabled.  Who live in a place with high altitude.  Who play endurance sports.  What are the signs or symptoms? Symptoms of mild dehydration may include:  Thirst.  Dry lips.  Slightly dry mouth.  Dry, warm skin.  Dizziness. Symptoms of moderate dehydration may include:  Very dry mouth.  Muscle cramps.  Dark urine. Urine may be the color of tea.  Decreased urine production.  Decreased tear production.  Heartbeat that is irregular or faster than normal (palpitations).  Headache.  Light-headedness, especially when you stand up from a sitting position.  Fainting (syncope). Symptoms of severe dehydration may include:  Changes in skin, such as: ? Cold and clammy skin. ? Blotchy (mottled) or pale skin. ? Skin that does  not quickly return to normal after being lightly pinched and released (poor skin turgor).  Changes in body fluids, such as: ? Extreme thirst. ? No tear production. ? Inability to sweat when body temperature is high, such as in hot weather. ? Very little urine production.  Changes in vital signs, such as: ? Weak pulse. ? Pulse that is more than 100 beats a minute when sitting still. ? Rapid breathing. ? Low blood pressure.  Other changes, such as: ? Sunken eyes. ? Cold hands and feet. ? Confusion. ? Lack of energy (lethargy). ? Difficulty waking up from sleep. ? Short-term weight loss. ? Unconsciousness. How is this diagnosed? This condition is diagnosed based on your symptoms and a physical exam. Blood and urine tests may be done to help confirm the diagnosis. How is this treated? Treatment for this condition depends on the severity. Mild or moderate dehydration can often be treated at home. Treatment should be started right away. Do not wait until dehydration becomes severe. Severe dehydration is an emergency and it needs to be treated in a hospital. Treatment for mild dehydration may include:  Drinking more fluids.  Replacing salts and minerals in your blood (electrolytes) that you may have lost. Treatment for moderate dehydration may include:  Drinking an oral rehydration solution (ORS). This is a drink that helps you replace fluids and electrolytes (rehydrate). It can be found at pharmacies and retail stores. Treatment for severe dehydration may include:  Receiving fluids through an IV tube.  Receiving an electrolyte solution through a feeding tube that is passed through your nose   and into your stomach (nasogastric tube, or NG tube).  Correcting any abnormalities in electrolytes.  Treating the underlying cause of dehydration. Follow these instructions at home:  If directed by your health care provider, drink an ORS: ? Make an ORS by following instructions on the  package. ? Start by drinking small amounts, about  cup (120 mL) every 5-10 minutes. ? Slowly increase how much you drink until you have taken the amount recommended by your health care provider.  Drink enough clear fluid to keep your urine clear or pale yellow. If you were told to drink an ORS, finish the ORS first, then start slowly drinking other clear fluids. Drink fluids such as: ? Water. Do not drink only water. Doing that can lead to having too little salt (sodium) in the body (hyponatremia). ? Ice chips. ? Fruit juice that you have added water to (diluted fruit juice). ? Low-calorie sports drinks.  Avoid: ? Alcohol. ? Drinks that contain a lot of sugar. These include high-calorie sports drinks, fruit juice that is not diluted, and soda. ? Caffeine. ? Foods that are greasy or contain a lot of fat or sugar.  Take over-the-counter and prescription medicines only as told by your health care provider.  Do not take sodium tablets. This can lead to having too much sodium in the body (hypernatremia).  Eat foods that contain a healthy balance of electrolytes, such as bananas, oranges, potatoes, tomatoes, and spinach.  Keep all follow-up visits as told by your health care provider. This is important. Contact a health care provider if:  You have abdominal pain that: ? Gets worse. ? Stays in one area (localizes).  You have a rash.  You have a stiff neck.  You are more irritable than usual.  You are sleepier or more difficult to wake up than usual.  You feel weak or dizzy.  You feel very thirsty.  You have urinated only a small amount of very dark urine over 6-8 hours. Get help right away if:  You have symptoms of severe dehydration.  You cannot drink fluids without vomiting.  Your symptoms get worse with treatment.  You have a fever.  You have a severe headache.  You have vomiting or diarrhea that: ? Gets worse. ? Does not go away.  You have blood or green matter  (bile) in your vomit.  You have blood in your stool. This may cause stool to look black and tarry.  You have not urinated in 6-8 hours.  You faint.  Your heart rate while sitting still is over 100 beats a minute.  You have trouble breathing. This information is not intended to replace advice given to you by your health care provider. Make sure you discuss any questions you have with your health care provider. Document Released: 02/10/2005 Document Revised: 09/07/2015 Document Reviewed: 04/06/2015 Elsevier Interactive Patient Education  2018 [employer]  

## 2016-09-18 NOTE — Progress Notes (Signed)
Hematology and Oncology Follow Up Visit  SANAIYA WELLIVER [id number] June 05, 1945 71 y.o. 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 Lines is here today for follow-up.she is not doing well at all. She is having a lot of abdominal issues. I'm not sure exactly why she is having these issues.  She had a staging CT scan and bone scan done a few weeks ago. Essentially, she has stable disease. On the CT scan she had pulmonary nodules which were stable. She has some retroperitoneal lymph nodes. No obvious masses were noted. There is some slight interval increase in retroperitoneal lymph node. On her bone scan, she has multiple bony lesions that were stable.  She went to the ER over the weekend. They did a CT of the abdomen and pelvis. This was without contrast. Nothing was noted. There is no bowel obstruction. There is no ileus.  She is on some antiacids. She has not been taking the hyoscyamine. We gave her some Reglan which I don't think she is taking.  We have her off the Cabometyx right now.  I just feel bad for her. She has some obvious abdominal distention. She was constipated. She now is on some MiraLAX. She's having some loose stools. There is no melena.  She has had no rashes. She's had no problems with respect to shingles. She's had no fever. There's been no bleeding. She's had no vomiting.  Overall, performance status is ECOG 2.  Medications:  Allergies as of 09/18/2016      Reactions   Azithromycin Other (See Comments)   Stomach cramps.  Green black stool    Ciprofloxacin Other (See Comments)   Body aches Chills Heart races   Etodolac Palpitations, Other (See Comments)   Joints hurt   Gnp [acetaminophen] Other (See Comments)   "GRN" (cough syrup) Heart races   Moxifloxacin Palpitations, Other (See Comments)   Body ache   Tramadol Nausea And Vomiting, Other (See Comments)   Dizziness   Rofecoxib Other (See Comments)   Pt unsure       Medication List       Accurate as of 09/18/16  4:34 PM. Always use your most recent med list.          ADVAIR DISKUS 100-50 MCG/DOSE Aepb Generic drug:  Fluticasone-Salmeterol Inhale 1 puff into the lungs 2 (two) times daily before a meal.   albuterol 90 MCG/ACT inhaler Commonly known as:  PROVENTIL,VENTOLIN Inhale 2 puffs into the lungs every 4 (four) hours as needed for wheezing.   amLODipine 5 MG tablet Commonly known as:  NORVASC Take 5 mg by mouth daily.   belladonna-PHENObarbital 16.2 MG/5ML Elix Commonly known as:  DONNATAL Take 5 mLs (16.2 mg total) by mouth 3 (three) times daily.   CABOMETYX 40 MG Tabs Generic drug:  cabozantinib S-Malate TAKE 1 TABLET BY MOUTH DAILY   CALCIUM + D PO Take 1 tablet by mouth daily.   cephALEXin 500 MG capsule Commonly known as:  KEFLEX Take 1 capsule (500 mg total) by mouth 3 (three) times daily.   colchicine 0.6 MG tablet  Take 1 tablet (0.6 mg total) by mouth daily.   dicyclomine 20 MG tablet Commonly known as:  BENTYL Take 20 mg by mouth 3 (three) times daily as needed (Stomach Cramps).   fentaNYL 12 MCG/HR Commonly known as:  DURAGESIC - dosed mcg/hr Place 12.5 mcg onto the skin every 3 (three) days.   HYDROcodone-acetaminophen 5-325 MG tablet Commonly known as:  NORCO Take 1 tablet by mouth every 6 (six) hours as needed.   LORazepam 0.5 MG tablet Commonly known as:  ATIVAN Take 0.5 mg by mouth every 4 (four) hours as needed for anxiety.   Magnesium 250 MG Tabs Take 250 mg by mouth daily.   meclizine 25 MG tablet Commonly known as:  ANTIVERT Take 12.5 mg by mouth 3 (three) times daily as needed.  For dizziness.   metoCLOPramide 10 MG tablet Commonly known as:  REGLAN Take 1 tablet (10 mg total) by mouth every 8 (eight) hours as needed for nausea or vomiting.   montelukast 10 MG tablet Commonly known as:  SINGULAIR Take 10 mg by mouth at bedtime.   nystatin 100000 UNIT/ML suspension Commonly known as:  MYCOSTATIN Take 5 mLs (500,000 Units total) by mouth 4 (four) times daily.   OSTEO BI-FLEX ADV DOUBLE ST PO Take 1 tablet by mouth daily.   pilocarpine 5 MG tablet Commonly known as:  SALAGEN Take 1 tablet (5 mg total) by mouth 2 (two) times daily.   predniSONE 5 MG tablet Commonly known as:  DELTASONE Take 1 tablet (5 mg total) by mouth daily with breakfast.   PROBIOTIC DAILY PO Take 1 tablet by mouth daily.   promethazine 12.5 MG tablet Commonly known as:  PHENERGAN Take 1 tablet (12.5 mg total) by mouth every 6 (six) hours as needed for nausea or vomiting.   ranitidine 150 MG tablet Commonly known as:  ZANTAC Take 1 tablet (150 mg total) by mouth 2 (two) times daily.   sucralfate 1 GM/10ML suspension Commonly known as:  CARAFATE Take 10 mLs (1 g total) by mouth 4 (four) times daily -  with meals and at bedtime.   vitamin C 500 MG tablet Commonly known as:  ASCORBIC ACID Take 500 mg by mouth daily.   XGEVA 120 MG/1.7ML Soln injection Generic drug:  denosumab Inject 120 mg into the skin every 30 (thirty) days.       Allergies:  Allergies  Allergen Reactions  . Azithromycin Other (See Comments)    Stomach cramps.  Green black stool   . Ciprofloxacin Other (See Comments)    Body aches Chills Heart races  . Etodolac Palpitations and Other (See Comments)    Joints hurt   . Gnp [Acetaminophen] Other (See Comments)    "GRN" (cough syrup) Heart races  . Moxifloxacin Palpitations and Other (See Comments)    Body ache  . Tramadol Nausea And Vomiting and Other (See Comments)    Dizziness  . Rofecoxib Other (See Comments)    Pt unsure     Past  Medical History, Surgical history, Social history, and Family History were reviewed and updated.  Review of Systems: All other 10 point review of systems is negative.   Physical Exam:  weight is 119 lb 9.6 oz (54.3 kg). Her oral temperature is 97.8 F (36.6 C). Her blood pressure is 114/63 and her pulse is 91. Her respiration is 17 and oxygen saturation is 100%.   Wt Readings from Last 3 Encounters:  09/18/16 119 lb 9.6 oz (54.3 kg)  08/21/16  121 lb (54.9 kg)  07/22/16 120 lb 12.8 oz (54.8 kg)    Thin but well-nourished white female in no obvious distress. Head and neck exam shows no ocular or oral lesions. She has no adenopathy in the neck. Thyroid is nonpalpable. Lungs are clear. Cardiac exam regular rate and rhythm with no murmurs, rubs or bruits. Abdomen is soft. Abdomen is slightly distended. Bowel sounds are slightly high-pitched. There is no guarding or rebound tenderness. She has no obvious fluid wave. There is no abdominal mass. There is no palpable liver or spleen tip. Back exam shows no tenderness over the spine, ribs or hips. Extremities shows no clubbing, cyanosis or edema. Neurological exam shows no focal neurological deficits. Skin exam shows no rashes, ecchymoses or petechia.  Lab Results  Component Value Date   WBC 4.0 09/18/2016   HGB 12.9 09/18/2016   HCT 39.6 09/18/2016   MCV 98 09/18/2016   PLT 201 09/18/2016   No results found for: FERRITIN, IRON, TIBC, UIBC, IRONPCTSAT Lab Results  Component Value Date   RBC 4.04 09/18/2016   No results found for: KPAFRELGTCHN, LAMBDASER, KAPLAMBRATIO No results found for: IGGSERUM, IGA, IGMSERUM No results found for: Odetta Pink, SPEI   Chemistry      Component Value Date/Time   NA 136 09/18/2016 1410   NA 137 03/04/2016 1043   K 4.8 (H) 09/18/2016 1410   K 4.1 03/04/2016 1043   CL 104 09/18/2016 1410   CO2 25 09/18/2016 1410   CO2 22 03/04/2016 1043   BUN 10  09/18/2016 1410   BUN 14.6 03/04/2016 1043   CREATININE 1.4 (H) 09/18/2016 1410   CREATININE 1.3 (H) 03/04/2016 1043   GLU 104 07/20/2008      Component Value Date/Time   CALCIUM 8.7 09/18/2016 1410   CALCIUM 9.4 03/04/2016 1043   ALKPHOS 59 09/18/2016 1410   ALKPHOS 77 03/04/2016 1043   AST 34 09/18/2016 1410   AST 22 03/04/2016 1043   ALT 32 09/18/2016 1410   ALT 20 03/04/2016 1043   BILITOT 1.00 09/18/2016 1410   BILITOT 0.35 03/04/2016 1043     Impression and Plan: Ms. Harb is a very pleasant 71 yo white female with metastatic clear cell renal carcinoma diagnosed in 2013.   I still am not sure as to why she is having this abdominal pain. I did a rectal exam on her. Her stool was brown but heme negative. I thought that maybe she would have bowel ischemia.  Maybe she has irritable bowel syndrome. She was on Bentyl but has not been taking this.  I want to try her on some Donnatal.  I don't see any evidence of a urinary tract infection.  I would not think this is coming from the Cabometyx.  She has been on pain medication. However, nothing has changed with respect to this.  I want to go ahead and give her some IV fluids. I will give her a little bit of Valium to try to help with any spasms. I will give her some IV Pepcid.   I will like to see her back in one week. I think we have to follow up closely with this issue.   I spent about 40 minutes with her today.    Volanda Napoleon, MD 7/26/20184:34 PM

## 2016-09-19 ENCOUNTER — Emergency Department (HOSPITAL_BASED_OUTPATIENT_CLINIC_OR_DEPARTMENT_OTHER)
Admission: EM | Admit: 2016-09-19 | Discharge: 2016-09-19 | Disposition: A | Discharge status: Home / Self Care | Payer: Medicare PPO | Attending: Emergency Medicine | Admitting: Emergency Medicine

## 2016-09-19 ENCOUNTER — Other Ambulatory Visit: Payer: Self-pay

## 2016-09-19 ENCOUNTER — Encounter (HOSPITAL_BASED_OUTPATIENT_CLINIC_OR_DEPARTMENT_OTHER): Payer: Self-pay | Admitting: Emergency Medicine

## 2016-09-19 ENCOUNTER — Emergency Department (HOSPITAL_BASED_OUTPATIENT_CLINIC_OR_DEPARTMENT_OTHER): Payer: Medicare PPO

## 2016-09-19 ENCOUNTER — Telehealth: Payer: Self-pay | Admitting: *Deleted

## 2016-09-19 DIAGNOSIS — J45909 Unspecified asthma, uncomplicated: Secondary | ICD-10-CM | POA: Diagnosis not present

## 2016-09-19 DIAGNOSIS — Z79899 Other long term (current) drug therapy: Secondary | ICD-10-CM | POA: Insufficient documentation

## 2016-09-19 DIAGNOSIS — I10 Essential (primary) hypertension: Secondary | ICD-10-CM | POA: Diagnosis not present

## 2016-09-19 DIAGNOSIS — Z87891 Personal history of nicotine dependence: Secondary | ICD-10-CM | POA: Diagnosis not present

## 2016-09-19 DIAGNOSIS — Z96651 Presence of right artificial knee joint: Secondary | ICD-10-CM | POA: Diagnosis not present

## 2016-09-19 DIAGNOSIS — R1013 Epigastric pain: Secondary | ICD-10-CM | POA: Diagnosis not present

## 2016-09-19 LAB — COMPREHENSIVE METABOLIC PANEL
ALBUMIN: 3.7 g/dL (ref 3.5–5.0)
ALT: 21 U/L (ref 14–54)
AST: 26 U/L (ref 15–41)
Alkaline Phosphatase: 49 U/L (ref 38–126)
Anion gap: 8 (ref 5–15)
BUN: 11 mg/dL (ref 6–20)
CHLORIDE: 107 mmol/L (ref 101–111)
CO2: 20 mmol/L — ABNORMAL LOW (ref 22–32)
Calcium: 8 mg/dL — ABNORMAL LOW (ref 8.9–10.3)
Creatinine, Ser: 1.33 mg/dL — ABNORMAL HIGH (ref 0.44–1.00)
GFR calc Af Amer: 45 mL/min — ABNORMAL LOW (ref >60)
GFR calc non Af Amer: 39 mL/min — ABNORMAL LOW (ref >60)
GLUCOSE: 79 mg/dL (ref 65–99)
POTASSIUM: 3.9 mmol/L (ref 3.5–5.1)
Sodium: 135 mmol/L (ref 135–145)
Total Bilirubin: 0.4 mg/dL (ref 0.3–1.2)
Total Protein: 5.8 g/dL — ABNORMAL LOW (ref 6.5–8.1)

## 2016-09-19 LAB — URINALYSIS, ROUTINE W REFLEX MICROSCOPIC
Glucose, UA: NEGATIVE mg/dL
HGB URINE DIPSTICK: NEGATIVE
Ketones, ur: 15 mg/dL — AB
Nitrite: NEGATIVE
Protein, ur: 100 mg/dL — AB
SPECIFIC GRAVITY, URINE: 1.022 (ref 1.005–1.030)
pH: 5.5 (ref 5.0–8.0)

## 2016-09-19 LAB — CBC WITH DIFFERENTIAL/PLATELET
BASOS ABS: 0 10*3/uL (ref 0.0–0.1)
BASOS PCT: 1 %
EOS PCT: 7 %
Eosinophils Absolute: 0.2 10*3/uL (ref 0.0–0.7)
HCT: 36.3 % (ref 36.0–46.0)
Hemoglobin: 11.9 g/dL — ABNORMAL LOW (ref 12.0–15.0)
Lymphocytes Relative: 31 %
Lymphs Abs: 1 10*3/uL (ref 0.7–4.0)
MCH: 32.1 pg (ref 26.0–34.0)
MCHC: 32.8 g/dL (ref 30.0–36.0)
MCV: 97.8 fL (ref 78.0–100.0)
MONO ABS: 0.4 10*3/uL (ref 0.1–1.0)
Monocytes Relative: 12 %
NEUTROS ABS: 1.6 10*3/uL — AB (ref 1.7–7.7)
Neutrophils Relative %: 49 %
PLATELETS: 193 10*3/uL (ref 150–400)
RBC: 3.71 MIL/uL — ABNORMAL LOW (ref 3.87–5.11)
RDW: 18.1 % — AB (ref 11.5–15.5)
WBC: 3.2 10*3/uL — ABNORMAL LOW (ref 4.0–10.5)

## 2016-09-19 LAB — URINALYSIS, MICROSCOPIC (REFLEX)

## 2016-09-19 LAB — I-STAT CHEM 8, ED
BUN: 11 mg/dL (ref 6–20)
CHLORIDE: 103 mmol/L (ref 101–111)
CREATININE: 1.2 mg/dL — AB (ref 0.44–1.00)
Calcium, Ion: 1.1 mmol/L — ABNORMAL LOW (ref 1.15–1.40)
GLUCOSE: 79 mg/dL (ref 65–99)
HCT: 36 % (ref 36.0–46.0)
Hemoglobin: 12.2 g/dL (ref 12.0–15.0)
POTASSIUM: 3.7 mmol/L (ref 3.5–5.1)
Sodium: 138 mmol/L (ref 135–145)
TCO2: 23 mmol/L (ref 0–100)

## 2016-09-19 LAB — I-STAT CG4 LACTIC ACID, ED: Lactic Acid, Venous: 0.72 mmol/L (ref 0.5–1.9)

## 2016-09-19 LAB — TROPONIN I

## 2016-09-19 LAB — LIPASE, BLOOD: Lipase: 33 U/L (ref 11–51)

## 2016-09-19 MED ORDER — SODIUM CHLORIDE 0.9 % IV BOLUS (SEPSIS)
1000.0000 mL | Freq: Once | INTRAVENOUS | Status: AC
  Administered 2016-09-19: 1000 mL via INTRAVENOUS

## 2016-09-19 MED ORDER — PANTOPRAZOLE SODIUM 40 MG PO TBEC
40.0000 mg | DELAYED_RELEASE_TABLET | Freq: Once | ORAL | Status: AC
  Administered 2016-09-19: 40 mg via ORAL
  Filled 2016-09-19: qty 1

## 2016-09-19 MED ORDER — MORPHINE SULFATE (PF) 4 MG/ML IV SOLN
4.0000 mg | Freq: Once | INTRAVENOUS | Status: AC
  Administered 2016-09-19: 4 mg via INTRAVENOUS
  Filled 2016-09-19: qty 1

## 2016-09-19 MED ORDER — GI COCKTAIL ~~LOC~~
30.0000 mL | Freq: Once | ORAL | Status: AC
  Administered 2016-09-19: 30 mL via ORAL
  Filled 2016-09-19: qty 30

## 2016-09-19 MED ORDER — SODIUM CHLORIDE 0.9 % IV BOLUS (SEPSIS)
500.0000 mL | Freq: Once | INTRAVENOUS | Status: AC
  Administered 2016-09-19: 500 mL via INTRAVENOUS

## 2016-09-19 MED ORDER — OMEPRAZOLE 20 MG PO CPDR: 20.0000 mg | DELAYED_RELEASE_CAPSULE | Freq: Every day | ORAL | 0 refills | Status: DC

## 2016-09-19 MED ORDER — IOPAMIDOL (ISOVUE-300) INJECTION 61%
100.0000 mL | Freq: Once | INTRAVENOUS | Status: AC | PRN
  Administered 2016-09-19: 80 mL via INTRAVENOUS

## 2016-09-19 MED ORDER — HYDROCODONE-ACETAMINOPHEN 5-325 MG PO TABS
1.0000 | ORAL_TABLET | ORAL | Status: AC | PRN
  Administered 2016-09-19: 1 via ORAL
  Filled 2016-09-19: qty 1

## 2016-09-19 MED ORDER — RANITIDINE HCL 150 MG/10ML PO SYRP
150.0000 mg | ORAL_SOLUTION | Freq: Once | ORAL | Status: AC
  Administered 2016-09-19: 150 mg via ORAL
  Filled 2016-09-19: qty 10

## 2016-09-19 NOTE — ED Notes (Signed)
Pt on auto VS and cardiac monitor. 

## 2016-09-19 NOTE — ED Provider Notes (Signed)
[facility] DEPT Provider Note   CSN: [id number] Arrival date & time: 09/19/16  0904     History   Chief Complaint Chief Complaint  Patient presents with  . Abdominal Pain    HPI BRITTANYA WINBURN is a 71 y.o. female.  71 year old female with multiple medical problems as denoted below presents to the emergency department for reevaluation of 2 weeks of epigastric abdominal pain. She states that she has intermittent bloating, gas, burning epigastric pain does not seem to be associated with meals. Does not radiate anywhere and does not have any vomiting with it. She initially thought it might be constipation strict MiraLAX and then subsequently has had diarrhea and normal formed stools. She was seen a week ago and had a noncontrasted CT scan that was normal. She saw her primary doctor yesterday who suggested different nausea medications. He also wanted her to see a gastroenterologist. Apparently overnight she had the recurrence of pain and was a lot worse her home nurse came in this morning and says she "did not look well" and called her oncologist who referred her to the emergency department as he cannot get her in to get an endoscopy today. She took a Vicodin before coming in and is symptom free at this time.      Past Medical History:  Diagnosis Date  . Arthritis    arthritis in knees and back   . Asthma   . Atypical chest pain    NORMAL CARDIOLITE STUDY   . Complication of anesthesia    hard to wake up from umb hernia-was given alot of meds  . Depression   . Dyslipidemia   . GERD (gastroesophageal reflux disease)   . Headache   . Hypertension   . Metastatic renal cell carcinoma to bone ([place]) 11/20/2014  . Seasonal allergies   . Syncope    HISTORY OF    Patient Active Problem List   Diagnosis Date Noted  . Presbyopia of both eyes 03/17/2016  . GERD (gastroesophageal reflux disease) 10/09/2015  . Cough variant asthma 05/02/2015  . Metastatic renal cell carcinoma to bone  ([place]) 11/20/2014  . Malignant neoplasm metastatic to bone ([place]) 08/29/2014  . H/O therapeutic radiation 08/29/2014  . Carcinoma of kidney ([place]) 08/29/2014  . LBP (low back pain) 08/29/2014  . Atypical chest pain   . Dyslipidemia   . Hypertension   . Syncope     Past Surgical History:  Procedure Laterality Date  . ABDOMINAL HYSTERECTOMY  2004   rectocele  . APPENDECTOMY    . BUNIONECTOMY Left 01-11-2013   left foot  . BUNIONECTOMY Right 12-31-2006   with hammer toe  . CARPAL TUNNEL RELEASE     R  . CHOLECYSTECTOMY N/A 08/27/2015   Procedure: LAPAROSCOPIC CHOLECYSTECTOMY WITH INTRAOPERATIVE CHOLANGIOGRAM;  Surgeon: [name], MD;  Location: [facility];  Service: General;  Laterality: N/A;  . COLONOSCOPY    . CYSTOCELE REPAIR    . DILATION AND CURETTAGE OF UTERUS    . HERNIA REPAIR  2014    umb, right and left  . INGUINAL HERNIA REPAIR Left 01/03/2014   Procedure: LEFT INGUINAL HERNIA REPAIR ;  Surgeon: [name], MD;  Location: [facility];  Service: General;  Laterality: Left;  . INGUINAL HERNIA REPAIR Right 04/06/2015   Procedure: HERNIA REPAIR RIGHT  INGUINAL ADULT WITH MESH;  Surgeon: [name], MD;  Location: [facility];  Service: General;  Laterality: Right;  . INSERTION OF MESH Left 01/03/2014   Procedure:  INSERTION OF MESH;  Surgeon: [name], MD;  Location: [facility];  Service: General;  Laterality: Left;  . INSERTION OF MESH Right 04/06/2015   Procedure: INSERTION OF MESH;  Surgeon: [name], MD;  Location: [facility];  Service: General;  Laterality: Right;  . IR GENERIC HISTORICAL  07/03/2015   IR RADIOLOGIST EVAL & MGMT 07/03/2015 Marybelle Killings, MD [facility] INTERV RAD  . IR GENERIC HISTORICAL  05/24/2015   IR RADIOLOGIST EVAL & MGMT 05/24/2015 Marybelle Killings, MD [facility] INTERV RAD  . JOINT REPLACEMENT    . 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 OTHER SURGICAL HISTORY     bladder vault prolapse  . OTHER SURGICAL HISTORY      right foot bunion and hammer toe surgery   . OTHER SURGICAL HISTORY     right knee arthroscopic   . OTHER SURGICAL HISTORY     right arm ( underarm ) surgery removed ? cyst   . TOTAL KNEE ARTHROPLASTY  03/11/2011   Procedure: TOTAL KNEE ARTHROPLASTY;  Surgeon: Cynda Familia, MD;  Location: [facility];  Service: Orthopedics;  Laterality: Right;  . TUBAL LIGATION     1977    OB History    No data available       Home Medications    Prior to Admission medications   Medication Sig Start Date End Date Taking? Authorizing Provider  albuterol (PROVENTIL,VENTOLIN) 90 MCG/ACT inhaler Inhale 2 puffs into the lungs every 4 (four) hours as needed for wheezing.     [provider]  amLODipine (NORVASC) 5 MG tablet Take 5 mg by mouth daily.  07/28/15   [provider]  belladonna-PHENObarbital (DONNATAL) 16.2 MG/5ML ELIX Take 5 mLs (16.2 mg total) by mouth 3 (three) times daily. 09/18/16   Volanda Napoleon, MD  CABOMETYX 40 MG TABS TAKE 1 TABLET BY MOUTH DAILY Patient taking differently: TAKE 1 TABLET BY MOUTH EVERY OTHER DAY 03/31/16   Volanda Napoleon, MD  Calcium Carbonate-Vitamin D (CALCIUM + D PO) Take 1 tablet by mouth daily.     [provider]  denosumab (XGEVA) 120 MG/1.7ML SOLN injection Inject 120 mg into the skin every 30 (thirty) days.    [provider]  dicyclomine (BENTYL) 20 MG tablet Take 20 mg by mouth 3 (three) times daily as needed (Stomach Cramps).    [provider]  fentaNYL (DURAGESIC - DOSED MCG/HR) 12 MCG/HR Place 12.5 mcg onto the skin every 3 (three) days.    [provider]  Fluticasone-Salmeterol (ADVAIR DISKUS) 100-50 MCG/DOSE AEPB Inhale 1 puff into the lungs 2 (two) times daily before a meal.    [provider]  HYDROcodone-acetaminophen (NORCO) 5-325 MG tablet Take 1 tablet by mouth every 6 (six) hours as needed. 03/01/16   Orlie Dakin, MD  LORazepam (ATIVAN) 0.5 MG tablet Take 0.5 mg by mouth every 4  (four) hours as needed for anxiety.    Hassie Bruce, OTR  Magnesium 250 MG TABS Take 250 mg by mouth daily.    [provider]  meclizine (ANTIVERT) 25 MG tablet Take 12.5 mg by mouth 3 (three) times daily as needed. For dizziness.    [provider]  metoCLOPramide (REGLAN) 10 MG tablet Take 1 tablet (10 mg total) by mouth every 8 (eight) hours as needed for nausea or vomiting. 09/13/16   Duffy Bruce, MD  Misc Natural Products (OSTEO BI-FLEX ADV DOUBLE ST PO) Take 1 tablet by mouth daily.  [provider]  montelukast (SINGULAIR) 10 MG tablet Take 10 mg by mouth at bedtime.     [provider]  nystatin (MYCOSTATIN) 100000 UNIT/ML suspension Take 5 mLs (500,000 Units total) by mouth 4 (four) times daily. 09/25/15   [place], Holli Humbles, NP  omeprazole (PRILOSEC) 20 MG capsule Take 1 capsule (20 mg total) by mouth daily. 09/19/16   Truc Winfree, Corene Cornea, MD  pilocarpine (SALAGEN) 5 MG tablet Take 1 tablet (5 mg total) by mouth 2 (two) times daily. 10/02/15   [place], Holli Humbles, NP  predniSONE (DELTASONE) 5 MG tablet Take 1 tablet (5 mg total) by mouth daily with breakfast. 03/31/16   [place], Holli Humbles, NP  Probiotic Product (PROBIOTIC DAILY PO) Take 1 tablet by mouth daily.     [provider]  promethazine (PHENERGAN) 12.5 MG tablet Take 1 tablet (12.5 mg total) by mouth every 6 (six) hours as needed for nausea or vomiting. 09/03/16   Volanda Napoleon, MD  ranitidine (ZANTAC) 150 MG tablet Take 1 tablet (150 mg total) by mouth 2 (two) times daily. 09/13/16 09/23/16  Duffy Bruce, MD  sucralfate (CARAFATE) 1 GM/10ML suspension Take 10 mLs (1 g total) by mouth 4 (four) times daily -  with meals and at bedtime. 09/13/16   Duffy Bruce, MD  vitamin C (ASCORBIC ACID) 500 MG tablet Take 500 mg by mouth daily.    [provider]    Family History Family History  Problem Relation Age of Onset  . Heart attack Father        DECEASED  . Cancer Mother          DECEASED-unsure of type  . Cancer Brother        LUNG CA- smoked  . Aneurysm Brother        AAA  . Asthma Sister     Social History Social History  Substance Use Topics  . Smoking status: Former Smoker    Packs/day: 0.25    Years: 10.00    Quit date: 02/24/1985  . Smokeless tobacco: Never Used  . Alcohol use No     Allergies   Azithromycin; Ciprofloxacin; Etodolac; Gnp [acetaminophen]; Moxifloxacin; Tramadol; and Rofecoxib   Review of Systems Review of Systems  All other systems reviewed and are negative.    Physical Exam Updated Vital Signs BP 132/70   Pulse 80   Temp 97.6 F (36.4 C) (Oral)   Resp 11   Ht 5\' 4"  (1.626 m)   Wt 54 kg (119 lb)   SpO2 95%   BMI 20.43 kg/m   Physical Exam  Constitutional: She is oriented to person, place, and time. She appears well-developed and well-nourished.  HENT:  Head: Normocephalic and atraumatic.  Eyes: Conjunctivae and EOM are normal.  Neck: Normal range of motion.  Cardiovascular: Normal rate and regular rhythm.   Pulmonary/Chest: Effort normal and breath sounds normal. No stridor. No respiratory distress.  Abdominal: She exhibits distension (mild). There is no tenderness.  Neurological: She is alert and oriented to person, place, and time. No cranial nerve deficit. Coordination normal.  Skin: Skin is warm and dry.  Nursing note and vitals reviewed.    ED Treatments / Results  Labs (all labs ordered are listed, but only abnormal results are displayed) Labs Reviewed  CBC WITH DIFFERENTIAL/PLATELET - Abnormal; Notable for the following:       Result Value   WBC 3.2 (*)    RBC 3.71 (*)    Hemoglobin 11.9 (*)  RDW 18.1 (*)    Neutro Abs 1.6 (*)    All other components within normal limits  COMPREHENSIVE METABOLIC PANEL - Abnormal; Notable for the following:    CO2 20 (*)    Creatinine, Ser 1.33 (*)    Calcium 8.0 (*)    Total Protein 5.8 (*)    GFR calc non Af Amer 39 (*)    GFR calc Af Amer 45 (*)     All other components within normal limits  URINALYSIS, ROUTINE W REFLEX MICROSCOPIC - Abnormal; Notable for the following:    Color, Urine AMBER (*)    APPearance CLOUDY (*)    Bilirubin Urine SMALL (*)    Ketones, ur 15 (*)    Protein, ur 100 (*)    Leukocytes, UA MODERATE (*)    All other components within normal limits  URINALYSIS, MICROSCOPIC (REFLEX) - Abnormal; Notable for the following:    Bacteria, UA FEW (*)    Squamous Epithelial / LPF 6-30 (*)    All other components within normal limits  I-STAT CHEM 8, ED - Abnormal; Notable for the following:    Creatinine, Ser 1.20 (*)    Calcium, Ion 1.10 (*)    All other components within normal limits  LIPASE, BLOOD  TROPONIN I  H. PYLORI ANTIBODY, IGG  I-STAT CG4 LACTIC ACID, ED    EKG  EKG Interpretation None       Radiology Ct Abdomen Pelvis W Contrast  Result Date: 09/19/2016 CLINICAL DATA:  Epigastric abdominal pain for 3 weeks. EXAM: CT ABDOMEN AND PELVIS WITH CONTRAST TECHNIQUE: Multidetector CT imaging of the abdomen and pelvis was performed using the standard protocol following bolus administration of intravenous contrast. CONTRAST:  58mL ISOVUE-300 IOPAMIDOL (ISOVUE-300) INJECTION 61% COMPARISON:  CT scan of September 13, 2016. FINDINGS: Lower chest: Multiple pulmonary metastases are seen in both lung bases. Hepatobiliary: Status post cholecystectomy. Stable left hepatic mass seen consistent with metastatic disease. Pancreas: Unremarkable. No pancreatic ductal dilatation or surrounding inflammatory changes. Spleen: Normal in size without focal abnormality. Adrenals/Urinary Tract: Stable bilateral adrenal masses are noted concerning for metastatic disease, left greater than right. Status post left nephrectomy. No hydronephrosis or renal obstruction is seen involving the right kidney. Multiple cysts are noted. 1.3 cm partially enhancing solid mass is seen in midpole cortex of right kidney concerning for malignancy. Urinary  bladder is decompressed. Stomach/Bowel: The stomach is unremarkable. Status post appendectomy. There is no evidence of bowel obstruction or inflammation. Vascular/Lymphatic: Aortic atherosclerosis. No enlarged abdominal or pelvic lymph nodes. Reproductive: Status post hysterectomy. No adnexal masses. Other: No abdominal wall hernia or abnormality. No abdominopelvic ascites. Musculoskeletal: Multiple metastatic lesions are noted in the spine with stable pathologic fracture of T12 vertebral body. IMPRESSION: Continued presence of pulmonary, osseous, left hepatic and adrenal metastases. Aortic atherosclerosis. Status post left nephrectomy. Multiple cysts are noted in the right kidney, although there is a 1.3 cm partially enhancing solid mass seen in midpole cortex of right kidney concerning for possible malignancy. MRI may be performed further evaluation. Electronically Signed   By: Marijo Conception, M.D.   On: 09/19/2016 14:52    Procedures Procedures (including critical care time)  Medications Ordered in ED Medications  ranitidine (ZANTAC) 150 MG/10ML syrup 150 mg (150 mg Oral Given 09/19/16 1009)  gi cocktail (Maalox,Lidocaine,Donnatal) (30 mLs Oral Given 09/19/16 1009)  pantoprazole (PROTONIX) EC tablet 40 mg (40 mg Oral Given 09/19/16 1009)  sodium chloride 0.9 % bolus 500 mL (0 mLs Intravenous Stopped  09/19/16 1305)  iopamidol (ISOVUE-300) 61 % injection 100 mL (80 mLs Intravenous Contrast Given 09/19/16 1415)  morphine 4 MG/ML injection 4 mg (4 mg Intravenous Given 09/19/16 1436)  sodium chloride 0.9 % bolus 1,000 mL (0 mLs Intravenous Stopped 09/19/16 1626)  HYDROcodone-acetaminophen (NORCO/VICODIN) 5-325 MG per tablet 1 tablet (1 tablet Oral Given 09/19/16 1625)     Initial Impression / Assessment and Plan / ED Course  I have reviewed the triage vital signs and the nursing notes.  Pertinent labs & imaging results that were available during my care of the patient were reviewed by me and considered  in my medical decision making (see chart for details).     Suspect possible perforated ulcer versus peptic ulcer disease versus gastritis versus colitis or possibly pancreatitis that were not seen in lipase elevations. Plan to recheck labs. After discussion with the patient and her primary doctor, [name] (who agrees), on the phone I will proceed with a contrasted CT scan. Patient only has one kidney. Kidney function has been stable and acceptable GFR. I don't think she is at higher risk for contrast-induced nephropathy than anyone else. I discussed the possibility that she could have some slight bumping of her kidney function how we would give her some fluids to help alleviate some of that she would need to follow up with primary doctor to make sure his recheck to make sure there is no acute kidney injury. In the interest of getting a more detailed exam she consented to the CT, knowing risks and benefits of the same.   Ct negative. Plan for continued oncologic and GI follow up. I discussed with Dr. Loletha Carrow who agreed to try and see her next week. Patient will call clinic.   Final Clinical Impressions(s) / ED Diagnoses   Final diagnoses:  Epigastric pain    New Prescriptions Discharge Medication List as of 09/19/2016  3:18 PM    START taking these medications   Details  omeprazole (PRILOSEC) 20 MG capsule Take 1 capsule (20 mg total) by mouth daily., Starting Fri 09/19/2016, Print         Koa Zoeller, Corene Cornea, MD 09/20/16 5277

## 2016-09-19 NOTE — ED Notes (Signed)
ED Provider at bedside. 

## 2016-09-19 NOTE — Telephone Encounter (Signed)
Call received from Rosanne Sack NP with [facility] to inform [name] that patient is experiencing increased abdominal pain, belching, abdominal distention, SOB, hyperactive BS and that her color is "bad".  [name] notified and order received for patient to go to the ED now.  Sharyn Lull notified of Dr. Antonieta Pert orders and will instruct pt to go to the ED now.

## 2016-09-19 NOTE — ED Triage Notes (Addendum)
Patient reports abdominal pain x2 weeks.  States home health nurse came over today and "didn't like the way I looked" and referred patient to PCP.  Seen previously for same last Saturday.  Reports PCP wanted patient to have endoscopy but states he was unable to schedule this for her today so he referred her to ER.  Reports nausea and diarrhea.  Denies vomiting.  Reports she took vicodin at 0830 this morning with some relief.

## 2016-09-19 NOTE — ED Notes (Signed)
Denies of abdominal pain at this time but verbalized pain to lower back .

## 2016-09-19 NOTE — ED Notes (Signed)
Pt refused wheelchair.  

## 2016-09-19 NOTE — ED Notes (Signed)
Pt declined Hydrocodone at this time stating that she is not in pain.  She is currently resting.

## 2016-09-21 ENCOUNTER — Emergency Department (HOSPITAL_COMMUNITY): Payer: Medicare PPO

## 2016-09-21 ENCOUNTER — Encounter (HOSPITAL_COMMUNITY): Payer: Self-pay | Admitting: Emergency Medicine

## 2016-09-21 ENCOUNTER — Observation Stay (HOSPITAL_COMMUNITY)
Admission: EM | Admit: 2016-09-21 | Discharge: 2016-09-22 | Disposition: A | Discharge status: Home / Self Care | Payer: Medicare PPO | Attending: Internal Medicine | Admitting: Internal Medicine

## 2016-09-21 DIAGNOSIS — C7951 Secondary malignant neoplasm of bone: Secondary | ICD-10-CM | POA: Diagnosis present

## 2016-09-21 DIAGNOSIS — K219 Gastro-esophageal reflux disease without esophagitis: Secondary | ICD-10-CM | POA: Insufficient documentation

## 2016-09-21 DIAGNOSIS — R1012 Left upper quadrant pain: Secondary | ICD-10-CM | POA: Diagnosis not present

## 2016-09-21 DIAGNOSIS — R109 Unspecified abdominal pain: Secondary | ICD-10-CM | POA: Diagnosis not present

## 2016-09-21 DIAGNOSIS — Z96651 Presence of right artificial knee joint: Secondary | ICD-10-CM | POA: Insufficient documentation

## 2016-09-21 DIAGNOSIS — Z7952 Long term (current) use of systemic steroids: Secondary | ICD-10-CM | POA: Diagnosis not present

## 2016-09-21 DIAGNOSIS — M549 Dorsalgia, unspecified: Secondary | ICD-10-CM | POA: Insufficient documentation

## 2016-09-21 DIAGNOSIS — C169 Malignant neoplasm of stomach, unspecified: Secondary | ICD-10-CM | POA: Insufficient documentation

## 2016-09-21 DIAGNOSIS — C7970 Secondary malignant neoplasm of unspecified adrenal gland: Secondary | ICD-10-CM | POA: Diagnosis not present

## 2016-09-21 DIAGNOSIS — Z85528 Personal history of other malignant neoplasm of kidney: Secondary | ICD-10-CM | POA: Insufficient documentation

## 2016-09-21 DIAGNOSIS — R1013 Epigastric pain: Secondary | ICD-10-CM | POA: Diagnosis not present

## 2016-09-21 DIAGNOSIS — Z7951 Long term (current) use of inhaled steroids: Secondary | ICD-10-CM | POA: Insufficient documentation

## 2016-09-21 DIAGNOSIS — Z87891 Personal history of nicotine dependence: Secondary | ICD-10-CM | POA: Insufficient documentation

## 2016-09-21 DIAGNOSIS — M546 Pain in thoracic spine: Secondary | ICD-10-CM

## 2016-09-21 DIAGNOSIS — J45991 Cough variant asthma: Secondary | ICD-10-CM | POA: Diagnosis not present

## 2016-09-21 DIAGNOSIS — K5909 Other constipation: Secondary | ICD-10-CM | POA: Insufficient documentation

## 2016-09-21 DIAGNOSIS — M4854XA Collapsed vertebra, not elsewhere classified, thoracic region, initial encounter for fracture: Secondary | ICD-10-CM | POA: Insufficient documentation

## 2016-09-21 DIAGNOSIS — K259 Gastric ulcer, unspecified as acute or chronic, without hemorrhage or perforation: Secondary | ICD-10-CM | POA: Diagnosis not present

## 2016-09-21 DIAGNOSIS — I10 Essential (primary) hypertension: Secondary | ICD-10-CM | POA: Insufficient documentation

## 2016-09-21 DIAGNOSIS — G8929 Other chronic pain: Secondary | ICD-10-CM | POA: Diagnosis not present

## 2016-09-21 DIAGNOSIS — R110 Nausea: Secondary | ICD-10-CM | POA: Diagnosis not present

## 2016-09-21 DIAGNOSIS — C649 Malignant neoplasm of unspecified kidney, except renal pelvis: Secondary | ICD-10-CM | POA: Diagnosis present

## 2016-09-21 DIAGNOSIS — D649 Anemia, unspecified: Secondary | ICD-10-CM

## 2016-09-21 DIAGNOSIS — Z79899 Other long term (current) drug therapy: Secondary | ICD-10-CM | POA: Insufficient documentation

## 2016-09-21 DIAGNOSIS — Z905 Acquired absence of kidney: Secondary | ICD-10-CM | POA: Diagnosis not present

## 2016-09-21 DIAGNOSIS — K2970 Gastritis, unspecified, without bleeding: Secondary | ICD-10-CM | POA: Diagnosis not present

## 2016-09-21 LAB — COMPREHENSIVE METABOLIC PANEL
ALT: 21 U/L (ref 14–54)
ANION GAP: 8 (ref 5–15)
AST: 28 U/L (ref 15–41)
Albumin: 3.6 g/dL (ref 3.5–5.0)
Alkaline Phosphatase: 46 U/L (ref 38–126)
BUN: 8 mg/dL (ref 6–20)
CHLORIDE: 111 mmol/L (ref 101–111)
CO2: 20 mmol/L — AB (ref 22–32)
Calcium: 7.7 mg/dL — ABNORMAL LOW (ref 8.9–10.3)
Creatinine, Ser: 0.94 mg/dL (ref 0.44–1.00)
GFR calc non Af Amer: 59 mL/min — ABNORMAL LOW (ref >60)
Glucose, Bld: 89 mg/dL (ref 65–99)
POTASSIUM: 3.9 mmol/L (ref 3.5–5.1)
SODIUM: 139 mmol/L (ref 135–145)
Total Bilirubin: 0.7 mg/dL (ref 0.3–1.2)
Total Protein: 6.1 g/dL — ABNORMAL LOW (ref 6.5–8.1)

## 2016-09-21 LAB — CBC WITH DIFFERENTIAL/PLATELET
Basophils Absolute: 0 10*3/uL (ref 0.0–0.1)
Basophils Relative: 1 %
EOS ABS: 0.1 10*3/uL (ref 0.0–0.7)
EOS PCT: 4 %
HCT: 33.3 % — ABNORMAL LOW (ref 36.0–46.0)
Hemoglobin: 10.7 g/dL — ABNORMAL LOW (ref 12.0–15.0)
LYMPHS ABS: 1.1 10*3/uL (ref 0.7–4.0)
Lymphocytes Relative: 33 %
MCH: 31.1 pg (ref 26.0–34.0)
MCHC: 32.1 g/dL (ref 30.0–36.0)
MCV: 96.8 fL (ref 78.0–100.0)
Monocytes Absolute: 0.3 10*3/uL (ref 0.1–1.0)
Monocytes Relative: 10 %
Neutro Abs: 1.7 10*3/uL (ref 1.7–7.7)
Neutrophils Relative %: 52 %
PLATELETS: 171 10*3/uL (ref 150–400)
RBC: 3.44 MIL/uL — AB (ref 3.87–5.11)
RDW: 18.1 % — AB (ref 11.5–15.5)
WBC: 3.3 10*3/uL — AB (ref 4.0–10.5)

## 2016-09-21 LAB — URINALYSIS, ROUTINE W REFLEX MICROSCOPIC
Bilirubin Urine: NEGATIVE
GLUCOSE, UA: NEGATIVE mg/dL
HGB URINE DIPSTICK: NEGATIVE
KETONES UR: 20 mg/dL — AB
NITRITE: NEGATIVE
PH: 7 (ref 5.0–8.0)
PROTEIN: NEGATIVE mg/dL
Specific Gravity, Urine: 1.004 — ABNORMAL LOW (ref 1.005–1.030)

## 2016-09-21 LAB — LIPASE, BLOOD: Lipase: 19 U/L (ref 11–51)

## 2016-09-21 MED ORDER — DICYCLOMINE HCL 10 MG PO CAPS: 10.0000 mg | ORAL_CAPSULE | Freq: Once | ORAL | Status: DC

## 2016-09-21 MED ORDER — MORPHINE SULFATE (PF) 10 MG/ML IV SOLN
1.0000 mg | INTRAVENOUS | Status: DC | PRN
  Administered 2016-09-21 – 2016-09-22 (×4): 1 mg via INTRAVENOUS
  Filled 2016-09-21 (×4): qty 1

## 2016-09-21 MED ORDER — GI COCKTAIL ~~LOC~~: 30.0000 mL | Freq: Once | ORAL | Status: DC

## 2016-09-21 MED ORDER — FLUTICASONE PROPIONATE 50 MCG/ACT NA SUSP
1.0000 | Freq: Every day | NASAL | Status: DC
  Filled 2016-09-21: qty 16

## 2016-09-21 MED ORDER — MONTELUKAST SODIUM 10 MG PO TABS
10.0000 mg | ORAL_TABLET | Freq: Every day | ORAL | Status: DC
  Administered 2016-09-21: 10 mg via ORAL
  Filled 2016-09-21: qty 1

## 2016-09-21 MED ORDER — MAGNESIUM OXIDE 400 (241.3 MG) MG PO TABS
200.0000 mg | ORAL_TABLET | Freq: Every day | ORAL | Status: DC
  Administered 2016-09-22: 200 mg via ORAL
  Filled 2016-09-21: qty 1

## 2016-09-21 MED ORDER — SODIUM CHLORIDE 0.9 % IV BOLUS (SEPSIS)
1000.0000 mL | Freq: Once | INTRAVENOUS | Status: AC
  Administered 2016-09-21: 1000 mL via INTRAVENOUS

## 2016-09-21 MED ORDER — ONDANSETRON HCL 4 MG/2ML IJ SOLN
4.0000 mg | Freq: Four times a day (QID) | INTRAMUSCULAR | Status: DC | PRN
  Administered 2016-09-22: 4 mg via INTRAVENOUS

## 2016-09-21 MED ORDER — SODIUM CHLORIDE 0.9 % IV SOLN
INTRAVENOUS | Status: AC
  Administered 2016-09-21: 17:00:00 via INTRAVENOUS

## 2016-09-21 MED ORDER — ALBUTEROL SULFATE (2.5 MG/3ML) 0.083% IN NEBU: 2.5000 mg | INHALATION_SOLUTION | RESPIRATORY_TRACT | Status: DC | PRN

## 2016-09-21 MED ORDER — ONDANSETRON HCL 4 MG/2ML IJ SOLN
4.0000 mg | Freq: Once | INTRAMUSCULAR | Status: AC
  Administered 2016-09-21: 4 mg via INTRAVENOUS
  Filled 2016-09-21: qty 2

## 2016-09-21 MED ORDER — LIDOCAINE 5 % EX PTCH
1.0000 | MEDICATED_PATCH | CUTANEOUS | Status: DC
  Administered 2016-09-21 – 2016-09-22 (×2): 1 via TRANSDERMAL
  Filled 2016-09-21 (×2): qty 1

## 2016-09-21 MED ORDER — METHOCARBAMOL 1000 MG/10ML IJ SOLN
500.0000 mg | Freq: Once | INTRAVENOUS | Status: AC
  Administered 2016-09-21: 500 mg via INTRAVENOUS
  Filled 2016-09-21: qty 550

## 2016-09-21 MED ORDER — FENTANYL CITRATE (PF) 100 MCG/2ML IJ SOLN
50.0000 ug | Freq: Once | INTRAMUSCULAR | Status: AC
  Administered 2016-09-21: 50 ug via INTRAVENOUS
  Filled 2016-09-21: qty 2

## 2016-09-21 MED ORDER — FENTANYL CITRATE (PF) 100 MCG/2ML IJ SOLN
100.0000 ug | Freq: Once | INTRAMUSCULAR | Status: AC
  Administered 2016-09-21: 100 ug via INTRAVENOUS
  Filled 2016-09-21: qty 2

## 2016-09-21 MED ORDER — PROMETHAZINE HCL 25 MG PO TABS
12.5000 mg | ORAL_TABLET | Freq: Four times a day (QID) | ORAL | Status: DC | PRN
Start: 2016-09-21 — End: 2016-09-22

## 2016-09-21 MED ORDER — HYDROCODONE-ACETAMINOPHEN 5-325 MG PO TABS
1.0000 | ORAL_TABLET | Freq: Four times a day (QID) | ORAL | Status: DC | PRN
  Administered 2016-09-21: 1 via ORAL
  Filled 2016-09-21: qty 1

## 2016-09-21 MED ORDER — FENTANYL 12 MCG/HR TD PT72
12.5000 ug | MEDICATED_PATCH | TRANSDERMAL | Status: DC
  Administered 2016-09-21: 12.5 ug via TRANSDERMAL
  Filled 2016-09-21: qty 1

## 2016-09-21 MED ORDER — PANTOPRAZOLE SODIUM 40 MG IV SOLR
40.0000 mg | Freq: Two times a day (BID) | INTRAVENOUS | Status: DC
  Administered 2016-09-21 – 2016-09-22 (×2): 40 mg via INTRAVENOUS
  Filled 2016-09-21 (×2): qty 40

## 2016-09-21 MED ORDER — MORPHINE SULFATE (PF) 2 MG/ML IV SOLN: 1.0000 mg | INTRAVENOUS | Status: DC | PRN

## 2016-09-21 MED ORDER — METHOCARBAMOL 1000 MG/10ML IJ SOLN: 500.0000 mg | Freq: Once | INTRAMUSCULAR | Status: DC

## 2016-09-21 MED ORDER — ACETAMINOPHEN 325 MG PO TABS: 650.0000 mg | ORAL_TABLET | Freq: Four times a day (QID) | ORAL | Status: DC | PRN

## 2016-09-21 MED ORDER — CABOZANTINIB S-MALATE 40 MG PO TABS: 40.0000 mg | ORAL_TABLET | ORAL | Status: DC

## 2016-09-21 MED ORDER — PREDNISONE 5 MG PO TABS
5.0000 mg | ORAL_TABLET | Freq: Every day | ORAL | Status: DC
  Administered 2016-09-22: 5 mg via ORAL
  Filled 2016-09-21: qty 1

## 2016-09-21 MED ORDER — ACETAMINOPHEN 650 MG RE SUPP: 650.0000 mg | Freq: Four times a day (QID) | RECTAL | Status: DC | PRN

## 2016-09-21 MED ORDER — AMLODIPINE BESYLATE 5 MG PO TABS
5.0000 mg | ORAL_TABLET | Freq: Every day | ORAL | Status: DC
  Administered 2016-09-21 – 2016-09-22 (×2): 5 mg via ORAL
  Filled 2016-09-21 (×2): qty 1

## 2016-09-21 MED ORDER — MOMETASONE FURO-FORMOTEROL FUM 100-5 MCG/ACT IN AERO
2.0000 | INHALATION_SPRAY | Freq: Two times a day (BID) | RESPIRATORY_TRACT | Status: DC
  Administered 2016-09-21 – 2016-09-22 (×2): 2 via RESPIRATORY_TRACT
  Filled 2016-09-21: qty 8.8

## 2016-09-21 MED ORDER — ENOXAPARIN SODIUM 40 MG/0.4ML ~~LOC~~ SOLN
40.0000 mg | SUBCUTANEOUS | Status: DC
  Administered 2016-09-21: 40 mg via SUBCUTANEOUS
  Filled 2016-09-21: qty 0.4

## 2016-09-21 NOTE — ED Notes (Signed)
Pt resting in bed, stating pain is a 20, 10/10 documented. Pt immediately closes eyes and relaxes as RN sat down to administer meds. Pt resting in bed with bed rails up.

## 2016-09-21 NOTE — ED Provider Notes (Signed)
[facility] DEPT Provider Note   CSN: [id number] Arrival date & time: 09/21/16  1038     History   Chief Complaint Chief Complaint  Patient presents with  . Abdominal Pain    HPI Cathy Lewis is a 71 y.o. female.  The history is provided by the patient.  Abdominal Pain   This is a recurrent problem. Episode onset: 2 weeks. Episode frequency: intermittent initially; now constant. The problem has not changed since onset.The pain is associated with an unknown factor. The pain is located in the LUQ and LLQ (left flank). The pain is moderate. Associated symptoms include nausea. Pertinent negatives include fever, diarrhea, vomiting, constipation and dysuria. Nothing aggravates the symptoms. Relieved by: norco. Past workup includes GI consult and CT scan (2 days ago; negative).   Patient has been evaluated recently for this in the past 2 days and has had negative workup. Was referred to GI for possible endoscopy. Patient called Dr. Loletha Carrow who is on-call for GI, who recommended patient presents emergency department for evaluation.  Past Medical History:  Diagnosis Date  . Arthritis    arthritis in knees and back   . Asthma   . Atypical chest pain    NORMAL CARDIOLITE STUDY   . Complication of anesthesia    hard to wake up from umb hernia-was given alot of meds  . Depression   . Dyslipidemia   . GERD (gastroesophageal reflux disease)   . Headache   . Hypertension   . 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 Seasonal allergies   . Syncope    HISTORY OF    Patient Active Problem List   Diagnosis Date Noted  . Abdominal pain 09/21/2016  . Anemia 09/21/2016  . Abdominal pain, epigastric   . Chronic constipation   . Presbyopia of both eyes 03/17/2016  . GERD (gastroesophageal reflux disease) 10/09/2015  . Cough variant asthma 05/02/2015  . 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 Malignant neoplasm metastatic to bone ([facility]) 08/29/2014    . H/O therapeutic radiation 08/29/2014  . Carcinoma of kidney ([facility]) 08/29/2014  . LBP (low back pain) 08/29/2014  . Atypical chest pain   . Dyslipidemia   . Hypertension   . Syncope     Past Surgical History:  Procedure Laterality Date  . ABDOMINAL HYSTERECTOMY  2004   rectocele  . APPENDECTOMY    . BUNIONECTOMY Left 01-11-2013   left foot  . BUNIONECTOMY Right 12-31-2006   with hammer toe  . CARPAL TUNNEL RELEASE     R  . CHOLECYSTECTOMY N/A 08/27/2015   Procedure: LAPAROSCOPIC CHOLECYSTECTOMY WITH INTRAOPERATIVE CHOLANGIOGRAM;  Surgeon: [name], MD;  Location: [facility];  Service: General;  Laterality: N/A;  . COLONOSCOPY    . CYSTOCELE REPAIR    . DILATION AND CURETTAGE OF UTERUS    . HERNIA REPAIR  2014    umb, right and left  . INGUINAL HERNIA REPAIR Left 01/03/2014   Procedure: LEFT INGUINAL HERNIA REPAIR ;  Surgeon: [name], MD;  Location: [facility];  Service: General;  Laterality: Left;  . INGUINAL HERNIA REPAIR Right 04/06/2015   Procedure: HERNIA REPAIR RIGHT  INGUINAL ADULT WITH MESH;  Surgeon: [name], MD;  Location: [facility];  Service: General;  Laterality: Right;  . INSERTION OF MESH Left 01/03/2014   Procedure: INSERTION OF MESH;  Surgeon: [name], MD;  Location: [facility];  Service: General;  Laterality: Left;  .  INSERTION OF MESH Right 04/06/2015   Procedure: INSERTION OF MESH;  Surgeon: [name], MD;  Location: [facility];  Service: General;  Laterality: Right;  . IR GENERIC HISTORICAL  07/03/2015   IR RADIOLOGIST EVAL & MGMT 07/03/2015 Marybelle Killings, MD [facility] INTERV RAD  . IR GENERIC HISTORICAL  05/24/2015   IR RADIOLOGIST EVAL & MGMT 05/24/2015 Marybelle Killings, MD [facility] INTERV RAD  . JOINT REPLACEMENT    . 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 OTHER SURGICAL HISTORY     bladder vault prolapse  . OTHER SURGICAL HISTORY     right foot bunion and hammer toe surgery   . OTHER SURGICAL HISTORY      right knee arthroscopic   . OTHER SURGICAL HISTORY     right arm ( underarm ) surgery removed ? cyst   . TOTAL KNEE ARTHROPLASTY  03/11/2011   Procedure: TOTAL KNEE ARTHROPLASTY;  Surgeon: Cynda Familia, MD;  Location: [facility];  Service: Orthopedics;  Laterality: Right;  . TUBAL LIGATION     1977    OB History    No data available       Home Medications    Prior to Admission medications   Medication Sig Start Date End Date Taking? Authorizing Provider  albuterol (PROVENTIL,VENTOLIN) 90 MCG/ACT inhaler Inhale 2 puffs into the lungs every 4 (four) hours as needed for wheezing.    Yes [provider]  amLODipine (NORVASC) 5 MG tablet Take 5 mg by mouth daily.  07/28/15  Yes [provider]  CABOMETYX 40 MG TABS TAKE 1 TABLET BY MOUTH DAILY Patient taking differently: TAKE 1 TABLET BY MOUTH EVERY OTHER DAY 03/31/16  Yes [place], Rudell Cobb, MD  Calcium Carbonate-Vitamin D (CALCIUM + D PO) Take 1 tablet by mouth daily.    Yes [provider]  cephALEXin (KEFLEX) 500 MG capsule Take 500 mg by mouth 3 (three) times daily. 09/15/16  Yes [provider]  denosumab (XGEVA) 120 MG/1.7ML SOLN injection Inject 120 mg into the skin every 30 (thirty) days.   Yes [provider]  fentaNYL (DURAGESIC - DOSED MCG/HR) 12 MCG/HR Place 12.5 mcg onto the skin every 3 (three) days.   Yes [provider]  fluticasone (FLONASE) 50 MCG/ACT nasal spray Place 1 spray into both nostrils daily.   Yes [provider]  Fluticasone-Salmeterol (ADVAIR DISKUS) 100-50 MCG/DOSE AEPB Inhale 1 puff into the lungs 2 (two) times daily before a meal.   Yes [provider]  HYDROcodone-acetaminophen (NORCO) 5-325 MG tablet Take 1 tablet by mouth every 6 (six) hours as needed. 03/01/16  Yes Orlie Dakin, MD  Magnesium 250 MG TABS Take 250 mg by mouth daily.   Yes [provider]  metoCLOPramide (REGLAN) 10 MG tablet Take 1 tablet (10 mg total) by  mouth every 8 (eight) hours as needed for nausea or vomiting. 09/13/16  Yes Duffy Bruce, MD  Misc Natural Products (OSTEO BI-FLEX ADV DOUBLE ST PO) Take 1 tablet by mouth daily.   Yes [provider]  montelukast (SINGULAIR) 10 MG tablet Take 10 mg by mouth at bedtime.    Yes [provider]  predniSONE (DELTASONE) 5 MG tablet Take 1 tablet (5 mg total) by mouth daily with breakfast. 03/31/16  Yes [place], Holli Humbles, NP  Probiotic Product (PROBIOTIC DAILY PO) Take 1 tablet by mouth daily.    Yes [provider]  ranitidine (ZANTAC) 150 MG tablet Take 1 tablet (150 mg total) by mouth  2 (two) times daily. 09/13/16 09/23/16 Yes Duffy Bruce, MD  sucralfate (CARAFATE) 1 GM/10ML suspension Take 10 mLs (1 g total) by mouth 4 (four) times daily -  with meals and at bedtime. 09/13/16  Yes Duffy Bruce, MD  vitamin C (ASCORBIC ACID) 500 MG tablet Take 500 mg by mouth daily.   Yes [provider]  dicyclomine (BENTYL) 20 MG tablet Take 20 mg by mouth 3 (three) times daily as needed (Stomach Cramps).    [provider]  nystatin (MYCOSTATIN) 100000 UNIT/ML suspension Take 5 mLs (500,000 Units total) by mouth 4 (four) times daily. Patient not taking: Reported on 09/21/2016 09/25/15   [place], Holli Humbles, NP  omeprazole (PRILOSEC) 20 MG capsule Take 1 capsule (20 mg total) by mouth daily. Patient not taking: Reported on 09/21/2016 09/19/16   Mesner, Corene Cornea, MD  pilocarpine (SALAGEN) 5 MG tablet Take 1 tablet (5 mg total) by mouth 2 (two) times daily. Patient not taking: Reported on 09/21/2016 10/02/15   [place], Holli Humbles, NP  promethazine (PHENERGAN) 12.5 MG tablet Take 1 tablet (12.5 mg total) by mouth every 6 (six) hours as needed for nausea or vomiting. 09/03/16   Volanda Napoleon, MD    Family History Family History  Problem Relation Age of Onset  . Heart attack Father        DECEASED  . Cancer Mother        DECEASED-unsure of type  . Cancer Brother         LUNG CA- smoked  . Aneurysm Brother        AAA  . Asthma Sister     Social History Social History  Substance Use Topics  . Smoking status: Former Smoker    Packs/day: 0.25    Years: 10.00    Quit date: 02/24/1985  . Smokeless tobacco: Never Used  . Alcohol use No     Allergies   Azithromycin; Ciprofloxacin; Etodolac; Gnp [acetaminophen]; Moxifloxacin; Tramadol; and Rofecoxib   Review of Systems Review of Systems  Constitutional: Negative for fever.  Gastrointestinal: Positive for abdominal pain and nausea. Negative for constipation, diarrhea and vomiting.  Genitourinary: Negative for dysuria.   All other systems are reviewed and are negative for acute change except as noted in the HPI   Physical Exam Updated Vital Signs BP (!) 135/94 (BP Location: Left Arm)   Pulse (!) 109   Temp (!) 97 F (36.1 C) (Axillary)   Resp 20   SpO2 100%   Physical Exam  Constitutional: She is oriented to person, place, and time. She appears well-developed and well-nourished. No distress.  HENT:  Head: Normocephalic and atraumatic.  Nose: Nose normal.  Eyes: Pupils are equal, round, and reactive to light. Conjunctivae and EOM are normal. Right eye exhibits no discharge. Left eye exhibits no discharge. No scleral icterus.  Neck: Normal range of motion. Neck supple.  Cardiovascular: Normal rate and regular rhythm.  Exam reveals no gallop and no friction rub.   No murmur heard. Pulmonary/Chest: Effort normal and breath sounds normal. No stridor. No respiratory distress. She has no rales.  Abdominal: Soft. She exhibits no distension. There is tenderness (mild discomfort) in the left upper quadrant. There is no rebound, no guarding and no CVA tenderness.    Musculoskeletal: She exhibits no edema or tenderness.       Back:  Neurological: She is alert and oriented to person, place, and time.  Skin: Skin is warm and dry. No rash noted. She is not diaphoretic.  No erythema.  Psychiatric: She  has a normal mood and affect.  Vitals reviewed.    ED Treatments / Results  Labs (all labs ordered are listed, but only abnormal results are displayed) Labs Reviewed  CBC WITH DIFFERENTIAL/PLATELET - Abnormal; Notable for the following:       Result Value   WBC 3.3 (*)    RBC 3.44 (*)    Hemoglobin 10.7 (*)    HCT 33.3 (*)    RDW 18.1 (*)    All other components within normal limits  COMPREHENSIVE METABOLIC PANEL - Abnormal; Notable for the following:    CO2 20 (*)    Calcium 7.7 (*)    Total Protein 6.1 (*)    GFR calc non Af Amer 59 (*)    All other components within normal limits  URINALYSIS, ROUTINE W REFLEX MICROSCOPIC - Abnormal; Notable for the following:    Color, Urine COLORLESS (*)    Specific Gravity, Urine 1.004 (*)    Ketones, ur 20 (*)    Leukocytes, UA SMALL (*)    Bacteria, UA RARE (*)    Squamous Epithelial / LPF 0-5 (*)    All other components within normal limits  LIPASE, BLOOD    EKG  EKG Interpretation None       Radiology Dg Thoracic Spine 2 View  Result Date: 09/21/2016 CLINICAL DATA:  Chronic back pain. Abdominal and epigastric pain for several weeks. No known injury. History of metastatic renal cell carcinoma per previous radiology reports. EXAM: THORACIC SPINE 2 VIEWS COMPARISON:  Chest CT and whole body bone scan 09/11/2016. FINDINGS: No visible ribs at T12. There is a stable chronic fracture at T12 with asymmetric loss of vertebral body height on the right and a resulting convex left scoliosis. Spinal augmentation has been performed at this level. Scattered small lytic lesions seen on CT are not discernible. No evidence of pathologic fracture or widening of the interpedicular distance. Lower cervical spondylosis noted. IMPRESSION: Stable radiographic appearance of the thoracic spine status post spinal augmentation for metastatic disease at T12. No acute findings seen. Electronically Signed   By: Richardean Sale M.D.   On: 09/21/2016 13:38     Procedures Procedures (including critical care time)  Medications Ordered in ED Medications  lidocaine (LIDODERM) 5 % 1 patch (1 patch Transdermal Patch Applied 09/21/16 1425)  ondansetron (ZOFRAN) injection 4 mg (4 mg Intravenous Given 09/21/16 1214)  sodium chloride 0.9 % bolus 1,000 mL (0 mLs Intravenous Stopped 09/21/16 1253)  fentaNYL (SUBLIMAZE) injection 50 mcg (50 mcg Intravenous Given 09/21/16 1215)  methocarbamol (ROBAXIN) 500 mg in dextrose 5 % 50 mL IVPB (0 mg Intravenous Stopped 09/21/16 1254)  fentaNYL (SUBLIMAZE) injection 100 mcg (100 mcg Intravenous Given 09/21/16 1425)     Initial Impression / Assessment and Plan / ED Course  I have reviewed the triage vital signs and the nursing notes.  Pertinent labs & imaging results that were available during my care of the patient were reviewed by me and considered in my medical decision making (see chart for details).     Patient's pain seems to be related to left thoracic muscular back pain that radiates across patient's left flank into the left upper quadrant. Abdomen is benign. Labs were grossly reassuring. Patient with known cancer and T12 compression fracture. Plain film without evidence of new bony lesions or fractures.  No suspicion for intra-abdominal source of patient's pain. Dr. Loletha Carrow evaluated the patient in the emergency department and requested patient be  admitted for pain control. During admission patient will receive an endoscopy to rule out gastric cause of her pain.  Discussed case with hospitalist who will admit the patient for further management  Final Clinical Impressions(s) / ED Diagnoses   Final diagnoses:  Chronic left-sided thoracic back pain  Left upper quadrant pain      Gerene Nedd, Grayce Sessions, MD 09/21/16 [id number]

## 2016-09-21 NOTE — ED Notes (Signed)
Although pt states pain has not improved, she is actually resting with eyes closed and looks more relaxed.

## 2016-09-21 NOTE — Progress Notes (Signed)
Pt was lying in a fetal position when I arrived and appeared to be in pain. When asked she admitted it was not a good time for a visit. I introduced myself and told her visitors to have the nurse page if they needed anything. Please page if additional support is needed.  [place], MDiv.   09/21/16 1900  Clinical Encounter Type  Visited With Patient and family together

## 2016-09-21 NOTE — ED Notes (Signed)
Bed: CN47 Expected date:  Expected time:  Means of arrival:  Comments: Triage

## 2016-09-21 NOTE — H&P (Signed)
TRH H&P   Patient Demographics:    Cathy Lewis, is a 71 y.o. female  MRN: [id number]   DOB - Aug 25, 1945  Admit Date - 09/21/2016  Outpatient Primary MD for the patient is Christain Sacramento, MD  Referring MD/NP/PA:   Ronalee Red  Outpatient Specialists:  Burney Gauze (oncology), Dr. Nevada Crane (urologist)  Patient coming from: home  Chief Complaint  Patient presents with  . Abdominal Pain      HPI:    Dezirea Lewis  is a 71 y.o. 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  Upper abdomen, left and right and center, and radiates towards the back.  Fairly constant. Slight nausea. Pt denies NSAIDS use.  Pt is only taking zantac currently,  Not omeprazole.  Denies fever, chills, emesis, diarrhea, brbpr, black stool.   Prior CT scan abd/pelvis 7/27 => left nephrectomy, continue presence of pulmonary , osseous, left hepatic and adrenal metastasis. 1.3 cm partially enhancing solid mass concerning for possible malignancy.   Pt presented to ED for evaluation of abdominal pain.    In ED,  Lipase 19, ua negative.  Ast 28, Alt 21, Alk phos 46, T. Bili 0.7   Pt was seen by GI,  EGD for tomorrow am.  Pt will be admitted for w/up of abdominal pain.     Review of systems:    In addition to the HPI above,  No Fever-chills, No Headache, No changes with Vision or hearing, No problems swallowing food or Liquids, No Chest pain, Cough or Shortness of Breath, No Blood in stool or Urine, No dysuria, No new skin rashes or bruises, No new joints pains-aches,  No new weakness, tingling, numbness in any extremity, No recent weight gain or loss, No polyuria, polydypsia or polyphagia, No significant Mental Stressors.  A full 10 point Review of Systems was done, except as stated above, all other Review of Systems were negative.   With Past  History of the following :    Past Medical History:  Diagnosis Date  . Arthritis    arthritis in knees and back   . Asthma   . Atypical chest pain    NORMAL CARDIOLITE STUDY   . Complication of anesthesia    hard to wake up from umb hernia-was given alot of meds  . Depression   . Dyslipidemia   . GERD (gastroesophageal reflux disease)   . Headache   . Hypertension   . Metastatic renal cell carcinoma to bone ([place]) 11/20/2014  . Seasonal allergies   . Syncope    HISTORY OF      Past Surgical History:  Procedure Laterality Date  . ABDOMINAL HYSTERECTOMY  2004   rectocele  . APPENDECTOMY    . BUNIONECTOMY Left 01-11-2013   left foot  . BUNIONECTOMY Right 12-31-2006   with hammer toe  .  CARPAL TUNNEL RELEASE     R  . CHOLECYSTECTOMY N/A 08/27/2015   Procedure: LAPAROSCOPIC CHOLECYSTECTOMY WITH INTRAOPERATIVE CHOLANGIOGRAM;  Surgeon: Avel Peace, MD;  Location: [facility] OR;  Service: General;  Laterality: N/A;  . COLONOSCOPY    . CYSTOCELE REPAIR    . DILATION AND CURETTAGE OF UTERUS    . HERNIA REPAIR  2014    umb, right and left  . INGUINAL HERNIA REPAIR Left 01/03/2014   Procedure: LEFT INGUINAL HERNIA REPAIR ;  Surgeon: Avel Peace, MD;  Location: [facility];  Service: General;  Laterality: Left;  . INGUINAL HERNIA REPAIR Right 04/06/2015   Procedure: HERNIA REPAIR RIGHT  INGUINAL ADULT WITH MESH;  Surgeon: Avel Peace, MD;  Location: [facility] ORS;  Service: General;  Laterality: Right;  . INSERTION OF MESH Left 01/03/2014   Procedure: INSERTION OF MESH;  Surgeon: Avel Peace, MD;  Location: [facility];  Service: General;  Laterality: Left;  . INSERTION OF MESH Right 04/06/2015   Procedure: INSERTION OF MESH;  Surgeon: Avel Peace, MD;  Location: [facility] ORS;  Service: General;  Laterality: Right;  . IR GENERIC HISTORICAL  07/03/2015   IR RADIOLOGIST EVAL & MGMT 07/03/2015 Jolaine Click, MD [facility] INTERV RAD  . IR GENERIC HISTORICAL  05/24/2015    IR RADIOLOGIST EVAL & MGMT 05/24/2015 Jolaine Click, MD [facility] INTERV RAD  . JOINT REPLACEMENT    . 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 OTHER SURGICAL HISTORY     bladder vault prolapse  . OTHER SURGICAL HISTORY     right foot bunion and hammer toe surgery   . OTHER SURGICAL HISTORY     right knee arthroscopic   . OTHER SURGICAL HISTORY     right arm ( underarm ) surgery removed ? cyst   . TOTAL KNEE ARTHROPLASTY  03/11/2011   Procedure: TOTAL KNEE ARTHROPLASTY;  Surgeon: Erasmo Leventhal, MD;  Location: [facility];  Service: Orthopedics;  Laterality: Right;  . TUBAL LIGATION     1977      Social History:     Social History  Substance Use Topics  . Smoking status: Former Smoker    Packs/day: 0.25    Years: 10.00    Quit date: 02/24/1985  . Smokeless tobacco: Never Used  . Alcohol use No     Lives - lives with husband  Mobility -   Walks by self without assitance   Family History :     Family History  Problem Relation Age of Onset  . Heart attack Father        DECEASED  . Cancer Mother        DECEASED-unsure of type  . Cancer Brother        LUNG CA- smoked  . Aneurysm Brother        AAA  . Asthma Sister      Home Medications:   Prior to Admission medications   Medication Sig Start Date End Date Taking? Authorizing Provider  albuterol (PROVENTIL,VENTOLIN) 90 MCG/ACT inhaler Inhale 2 puffs into the lungs every 4 (four) hours as needed for wheezing.    Yes [provider]  amLODipine (NORVASC) 5 MG tablet Take 5 mg by mouth daily.  07/28/15  Yes [provider]  CABOMETYX 40 MG TABS TAKE 1 TABLET BY MOUTH DAILY Patient taking differently: TAKE 1 TABLET BY MOUTH EVERY OTHER DAY 03/31/16  Yes Josph Macho, MD  Calcium Carbonate-Vitamin D (CALCIUM + D PO) Take  1 tablet by mouth daily.    Yes [provider]  cephALEXin (KEFLEX) 500 MG capsule Take 500 mg by mouth 3 (three) times daily. 09/15/16  Yes [provider]    denosumab (XGEVA) 120 MG/1.7ML SOLN injection Inject 120 mg into the skin every 30 (thirty) days.   Yes [provider]  fentaNYL (DURAGESIC - DOSED MCG/HR) 12 MCG/HR Place 12.5 mcg onto the skin every 3 (three) days.   Yes [provider]  fluticasone (FLONASE) 50 MCG/ACT nasal spray Place 1 spray into both nostrils daily.   Yes [provider]  Fluticasone-Salmeterol (ADVAIR DISKUS) 100-50 MCG/DOSE AEPB Inhale 1 puff into the lungs 2 (two) times daily before a meal.   Yes [provider]  HYDROcodone-acetaminophen (NORCO) 5-325 MG tablet Take 1 tablet by mouth every 6 (six) hours as needed. 03/01/16  Yes Orlie Dakin, MD  Magnesium 250 MG TABS Take 250 mg by mouth daily.   Yes [provider]  metoCLOPramide (REGLAN) 10 MG tablet Take 1 tablet (10 mg total) by mouth every 8 (eight) hours as needed for nausea or vomiting. 09/13/16  Yes Duffy Bruce, MD  Misc Natural Products (OSTEO BI-FLEX ADV DOUBLE ST PO) Take 1 tablet by mouth daily.   Yes [provider]  montelukast (SINGULAIR) 10 MG tablet Take 10 mg by mouth at bedtime.    Yes [provider]  predniSONE (DELTASONE) 5 MG tablet Take 1 tablet (5 mg total) by mouth daily with breakfast. 03/31/16  Yes [place], Holli Humbles, NP  Probiotic Product (PROBIOTIC DAILY PO) Take 1 tablet by mouth daily.    Yes [provider]  ranitidine (ZANTAC) 150 MG tablet Take 1 tablet (150 mg total) by mouth 2 (two) times daily. 09/13/16 09/23/16 Yes Duffy Bruce, MD  sucralfate (CARAFATE) 1 GM/10ML suspension Take 10 mLs (1 g total) by mouth 4 (four) times daily -  with meals and at bedtime. 09/13/16  Yes Duffy Bruce, MD  vitamin C (ASCORBIC ACID) 500 MG tablet Take 500 mg by mouth daily.   Yes [provider]  dicyclomine (BENTYL) 20 MG tablet Take 20 mg by mouth 3 (three) times daily as needed (Stomach Cramps).    [provider]  nystatin (MYCOSTATIN) 100000 UNIT/ML  suspension Take 5 mLs (500,000 Units total) by mouth 4 (four) times daily. Patient not taking: Reported on 09/21/2016 09/25/15   [place], Holli Humbles, NP  omeprazole (PRILOSEC) 20 MG capsule Take 1 capsule (20 mg total) by mouth daily. Patient not taking: Reported on 09/21/2016 09/19/16   Mesner, Corene Cornea, MD  pilocarpine (SALAGEN) 5 MG tablet Take 1 tablet (5 mg total) by mouth 2 (two) times daily. Patient not taking: Reported on 09/21/2016 10/02/15   [place], Holli Humbles, NP  promethazine (PHENERGAN) 12.5 MG tablet Take 1 tablet (12.5 mg total) by mouth every 6 (six) hours as needed for nausea or vomiting. 09/03/16   Volanda Napoleon, MD     Allergies:     Allergies  Allergen Reactions  . Azithromycin Other (See Comments)    Stomach cramps.  Green black stool   . Ciprofloxacin Other (See Comments)    Body aches Chills Heart races  . Etodolac Palpitations and Other (See Comments)    Joints hurt   . Gnp [Acetaminophen] Other (See Comments)    "GRN" (cough syrup) Heart races  . Moxifloxacin Palpitations and Other (See Comments)    Body ache  . Tramadol Nausea And Vomiting and Other (See Comments)  Dizziness  . Rofecoxib Other (See Comments)    Pt unsure      Physical Exam:   Vitals  Blood pressure 133/75, pulse 84, temperature (!) 97 F (36.1 C), temperature source Axillary, resp. rate 16, SpO2 98 %.   1. General  lying in bed in NAD,    2. Normal affect and insight, Not Suicidal or Homicidal, Awake Alert, Oriented X 3.  3. No F.N deficits, ALL C.Nerves Intact, Strength 5/5 all 4 extremities, Sensation intact all 4 extremities, Plantars down going.  4. Ears and Eyes appear Normal, Conjunctivae clear, PERRLA. Moist Oral Mucosa.  5. Supple Neck, No JVD, No cervical lymphadenopathy appriciated, No Carotid Bruits.  6. Symmetrical Chest wall movement, Good air movement bilaterally, CTAB.  7. RRR, No Gallops, Rubs or Murmurs, No Parasternal Heave.  8. Positive Bowel Sounds,  Abdomen Soft, No tenderness, No organomegaly appriciated,No rebound -guarding or rigidity.  9.  No Cyanosis, Normal Skin Turgor, No Skin Rash or Bruise.  10. Good muscle tone,  joints appear normal , no effusions, Normal ROM.  11. No Palpable Lymph Nodes in Neck or Axillae    Data Review:    CBC  Recent Labs Lab 09/18/16 1410 09/19/16 0959 09/19/16 1019 09/21/16 1141  WBC 4.0 3.2*  --  3.3*  HGB 12.9 11.9* 12.2 10.7*  HCT 39.6 36.3 36.0 33.3*  PLT 201 193  --  171  MCV 98 97.8  --  96.8  MCH 31.9 32.1  --  31.1  MCHC 32.6 32.8  --  32.1  RDW 17.7* 18.1*  --  18.1*  LYMPHSABS 1.3 1.0  --  1.1  MONOABS  --  0.4  --  0.3  EOSABS 0.3 0.2  --  0.1  BASOSABS 0.0 0.0  --  0.0   ------------------------------------------------------------------------------------------------------------------  Chemistries   Recent Labs Lab 09/18/16 1410 09/19/16 0959 09/19/16 1019 09/21/16 1141  NA 136 135 138 139  K 4.8* 3.9 3.7 3.9  CL 104 107 103 111  CO2 25 20*  --  20*  GLUCOSE 97 79 79 89  BUN _0 CREATININE 1.4* 1.33* 1.20* 0.94  CALCIUM 8.7 8.0*  --  7.7*  AST 34 26  --  28  ALT 32 21  --  21  ALKPHOS 59 49  --  46  BILITOT 1.00 0.4  --  0.7   ------------------------------------------------------------------------------------------------------------------ estimated creatinine clearance is 45.4 mL/min (by C-G formula based on SCr of 0.94 mg/dL). ------------------------------------------------------------------------------------------------------------------ No results for input(s): TSH, T4TOTAL, T3FREE, THYROIDAB in the last 72 hours.  Invalid input(s): FREET3  Coagulation profile No results for input(s): INR, PROTIME in the last 168 hours. ------------------------------------------------------------------------------------------------------------------- No results for input(s): DDIMER in the last 72  hours. -------------------------------------------------------------------------------------------------------------------  Cardiac Enzymes  Recent Labs Lab 09/19/16 0959  TROPONINI <0.03   ------------------------------------------------------------------------------------------------------------------ No results found for: BNP   ---------------------------------------------------------------------------------------------------------------  Urinalysis    Component Value Date/Time   COLORURINE COLORLESS (A) 09/21/2016 1250   APPEARANCEUR CLEAR 09/21/2016 1250   LABSPEC 1.004 (L) 09/21/2016 1250   LABSPEC 1.020 09/25/2015 1056   PHURINE 7.0 09/21/2016 1250   GLUCOSEU NEGATIVE 09/21/2016 1250   HGBUR NEGATIVE 09/21/2016 1250   BILIRUBINUR NEGATIVE 09/21/2016 1250   KETONESUR 20 (A) 09/21/2016 1250   PROTEINUR NEGATIVE 09/21/2016 1250   UROBILINOGEN 0.2 09/25/2015 1056   NITRITE NEGATIVE 09/21/2016 1250   LEUKOCYTESUR SMALL (A) 09/21/2016 1250    ----------------------------------------------------------------------------------------------------------------   Imaging Results:    Dg Thoracic Spine  2 View  Result Date: 09/21/2016 CLINICAL DATA:  Chronic back pain. Abdominal and epigastric pain for several weeks. No known injury. History of metastatic renal cell carcinoma per previous radiology reports. EXAM: THORACIC SPINE 2 VIEWS COMPARISON:  Chest CT and whole body bone scan 09/11/2016. FINDINGS: No visible ribs at T12. There is a stable chronic fracture at T12 with asymmetric loss of vertebral body height on the right and a resulting convex left scoliosis. Spinal augmentation has been performed at this level. Scattered small lytic lesions seen on CT are not discernible. No evidence of pathologic fracture or widening of the interpedicular distance. Lower cervical spondylosis noted. IMPRESSION: Stable radiographic appearance of the thoracic spine status post spinal augmentation  for metastatic disease at T12. No acute findings seen. Electronically Signed   By: Richardean Sale M.D.   On: 09/21/2016 13:38      Assessment & Plan:    Principal Problem:   Abdominal pain Active Problems:   Malignant neoplasm metastatic to bone (HCC)   Carcinoma of kidney (HCC)   Anemia   Abdominal pain protonix 63m iv bid liquids, NPO after MN for EGD in am Appreciate GI input  Nausea Zofran 471miv q6h prn   T12 compression fracture ? Metastatic disease ? Source of abdominal discomfort?  Metastatic renal cell carcinoma  Cont XGeva F/u with oncology  Anemia Repeat cbc in am  Hypertension Cont amlodipine   DVT Prophylaxis Lovenox - SCDs   AM Labs Ordered, also please review Full Orders  Family Communication: Admission, patients condition and plan of care including tests being ordered have been discussed with the patient  who indicate understanding and agree with the plan and Code Status.  Code Status DNR  Likely DC to  home  Condition GUARDED    Consults called: GI by ED  Admission status: observation  Time spent in minutes : 45 minutes   JaJani Gravel.D on 09/21/2016 at 3:25 PM  Between 7am to 7pm - Pager - [phone]. After 7pm go to [email or web address] - password [email or web address] - Office  [phone]

## 2016-09-21 NOTE — ED Triage Notes (Signed)
Pt c/o abdominal pain and epigastric pain x several weeks. Pt states she was recently seen at [facility] and had CT and labs. Pt states her pain has worsened since being discharged. Pt states she has been constipated and last BM 3 days ago, pt reports normal.

## 2016-09-21 NOTE — Consult Note (Signed)
[facility] Consult Note   History Cathy Lewis MRN # [id number]  Date of Admission: 09/21/2016 Date of Consultation: 09/21/2016 Referring physician: Dr. Leonette Monarch, Grayce Sessions, *  Reason for Consultation/Chief Complaint: Epigastric pain  Subjective  HPI:  This is a 71 year old woman who came to the ED today for intractable epigastric/left upper quadrant pain. She was in the [place] ED last week with the same symptoms, headache CT scan of the abdomen that was unremarkable and was discharged home. She was back 2 days ago for the same symptoms, another CT scan was done that was also unremarkable and she was sent home again. On that occasion they called me and asked me to see her in clinic this coming week to consider upper endoscopy. This patient called me through my answering service earlier this morning and was crying, complaining of intractable upper abdominal pain. I therefore referred her to the ED.  She reports tendencies to constipation and upper abdominal bloating for many years. 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 With this she has also had an increase in the upper abdominal pain and bloating. She will take MiraLAX once a day but not always have good bowel movements. Of note, nurses report that when the patient is left alone for some period of time she appears to be resting comfortably in bed. When someone enters the room she is complaining of more pain, then starts getting up and walking around saying she cannot get an any comfortable position. This is the where I found her in her ED room today. Her husband is there at the bedside for the entire visit as well.  She reports an unrelenting feeling of bloating and pressure in the left upper quadrant. She also has chronic mid back pain that is keeping her from sleeping at night. She says between these 2 problems she walks  the floor most of the night without getting much sleep.  She denies nausea, vomiting, weight loss or rectal bleeding.  ROS:  Musculoskeletal:   Back pain from T12 compression fracture Psychiatric:   Chronic anxiety All other systems are negative except as noted above in the HPI  Past Medical History Past Medical History:  Diagnosis Date  . Arthritis    arthritis in knees and back   . Asthma   . Atypical chest pain    NORMAL CARDIOLITE STUDY   . Complication of anesthesia    hard to wake up from umb hernia-was given alot of meds  . Depression   . Dyslipidemia   . GERD (gastroesophageal reflux disease)   . Headache   . Hypertension   . 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 Seasonal allergies   . Syncope    HISTORY OF   I reviewed her most recent clinic note from her oncologist, Dr,. Ennever reporting stable metastatic renal cancer and a stable drug regimen. Past Surgical History Past Surgical History:  Procedure Laterality Date  . ABDOMINAL HYSTERECTOMY  2004   rectocele  . APPENDECTOMY    . BUNIONECTOMY Left 01-11-2013   left foot  . BUNIONECTOMY Right 12-31-2006   with hammer toe  . CARPAL TUNNEL RELEASE     R  . CHOLECYSTECTOMY N/A 08/27/2015   Procedure: LAPAROSCOPIC CHOLECYSTECTOMY WITH INTRAOPERATIVE CHOLANGIOGRAM;  Surgeon: [name], MD;  Location: [place];  Service: General;  Laterality: N/A;  . COLONOSCOPY    .  CYSTOCELE REPAIR    . DILATION AND CURETTAGE OF UTERUS    . HERNIA REPAIR  2014    umb, right and left  . INGUINAL HERNIA REPAIR Left 01/03/2014   Procedure: LEFT INGUINAL HERNIA REPAIR ;  Surgeon: [name], MD;  Location: [place];  Service: General;  Laterality: Left;  . INGUINAL HERNIA REPAIR Right 04/06/2015   Procedure: HERNIA REPAIR RIGHT  INGUINAL ADULT WITH MESH;  Surgeon: [name], MD;  Location: [facility];  Service: General;  Laterality: Right;  . INSERTION OF MESH Left 01/03/2014    Procedure: INSERTION OF MESH;  Surgeon: [name], MD;  Location: [place];  Service: General;  Laterality: Left;  . INSERTION OF MESH Right 04/06/2015   Procedure: INSERTION OF MESH;  Surgeon: [name], MD;  Location: [facility];  Service: General;  Laterality: Right;  . IR GENERIC HISTORICAL  07/03/2015   IR RADIOLOGIST EVAL & MGMT 07/03/2015 Marybelle Killings, MD [facility] INTERV RAD  . IR GENERIC HISTORICAL  05/24/2015   IR RADIOLOGIST EVAL & MGMT 05/24/2015 Marybelle Killings, MD [facility] INTERV RAD  . JOINT REPLACEMENT    . 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 OTHER SURGICAL HISTORY     bladder vault prolapse  . OTHER SURGICAL HISTORY     right foot bunion and hammer toe surgery   . OTHER SURGICAL HISTORY     right knee arthroscopic   . OTHER SURGICAL HISTORY     right arm ( underarm ) surgery removed ? cyst   . TOTAL KNEE ARTHROPLASTY  03/11/2011   Procedure: TOTAL KNEE ARTHROPLASTY;  Surgeon: Cynda Familia, MD;  Location: [facility];  Service: Orthopedics;  Laterality: Right;  . TUBAL LIGATION     1977  She appears to have a kyphoplasty in May 2017.  Family History Family History  Problem Relation Age of Onset  . Heart attack Father        DECEASED  . Cancer Mother        DECEASED-unsure of type  . Cancer Brother        LUNG CA- smoked  . Aneurysm Brother        AAA  . Asthma Sister     Social History Social History   Social History  . Marital status: Married    Spouse name: N/A  . Number of children: N/A  . Years of education: N/A   Social History Main Topics  . Smoking status: Former Smoker    Packs/day: 0.25    Years: 10.00    Quit date: 02/24/1985  . Smokeless tobacco: Never Used  . Alcohol use No  . Drug use: No  . Sexual activity: Not Asked   Other Topics Concern  . None   Social History Narrative  . None    Allergies Allergies  Allergen Reactions  . Azithromycin Other (See Comments)    Stomach cramps.  Green black stool   .  Ciprofloxacin Other (See Comments)    Body aches Chills Heart races  . Etodolac Palpitations and Other (See Comments)    Joints hurt   . Gnp [Acetaminophen] Other (See Comments)    "GRN" (cough syrup) Heart races  . Moxifloxacin Palpitations and Other (See Comments)    Body ache  . Tramadol Nausea And Vomiting and Other (See Comments)    Dizziness  . Rofecoxib Other (See Comments)    Pt unsure     Outpatient Meds Home medications from  the [facility] and/or nursing med reconciliation reviewed.  Inpatient med list reviewed  _____________________________________________________________________ Objective   Exam:  Current vital signs  Patient Vitals for the past 8 hrs:  BP Temp Temp src Pulse Resp SpO2  09/21/16 1046 (!) 135/94 (!) 97 F (36.1 C) Axillary (!) 109 20 100 %    Intake/Output Summary (Last 24 hours) at 09/21/16 1416 Last data filed at 09/21/16 1253  Gross per 24 hour  Intake             1000 ml  Output                0 ml  Net             1000 ml    Physical Exam:    General: this is an elderly female patient standing up walking around the room while massaging her left upper abdomen complaining of pain.   Eyes: sclera anicteric, no redness  ENT: oral mucosa moist without lesions, no cervical or supraclavicular lymphadenopathy, good dentition  CV: RRR without murmur, S1/S2, no JVD,, no peripheral edema  Resp: clear to auscultation bilaterally, normal RR and effort noted  GI: soft, no tenderness (in fact, she says the pain is improved when I press on the left upper quadrant), with active bowel sounds. No guarding or palpable organomegaly noted  Skin; warm and dry, no rash or jaundice noted  Neuro: awake, alert and oriented x 3. Normal gross motor function and fluent speech. She has point tenderness in the mid spine  Labs:   Recent Labs Lab 09/18/16 1410 09/19/16 0959 09/19/16 1019 09/21/16 1141  WBC 4.0 3.2*  --  3.3*  HGB 12.9 11.9* 12.2  10.7*  HCT 39.6 36.3 36.0 33.3*  PLT 201 193  --  171    Recent Labs Lab 09/21/16 1141  NA 139  K 3.9  CL 111  CO2 20*  BUN 8  ALBUMIN 3.6  ALKPHOS 46  ALT 21  AST 28  GLUCOSE 89   No results for input(s): INR in the last 168 hours.  Radiologic studies: Thoracic spine PA and lateral films have been taken, no report yet.  I reviewed the images from her 2 CT scans of the abdomen and pelvis over the last week, and compared the spinal metastasis to scans earlier this year. The reports are listed in the chart. In addition, she has patent SMA  @ASSESSMENTPLANBEGIN @ Impression:  Epigastric/left upper quadrant pain. 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  I suspect this is largely driven by worsened thoracic spine pain with some referred nature of the pain to the upper abdomen as well as an increase in anxiety exacerbating an underlying functional bowel disorder. 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   Plan:  Internal medicine will be consulted to see if they are agreeable to a medical admission for pain control and further attention to her compression fracture if needed. I discussed an upper endoscopy with Cathy Lewis. I described the procedure in detail including the risks and benefits and she is agreeable.  The benefits and risks of the planned procedure were described in detail with the patient or (when appropriate) their health care proxy.  Risks were outlined as including, but not limited to, bleeding, infection, perforation, adverse medication reaction leading to cardiac or pulmonary decompensation, or pancreatitis (if ERCP).  The limitation of incomplete mucosal visualization was also discussed.  No  guarantees or warranties were given.  If it is normal, then I think we will be assures we can at there is no organic digestive condition causing this pain.  Please give her  MiraLAX 2-3 times a day so she does not develop worsening constipation from narcotics, thereby adding to this confusing picture.  Dr. Silverio Decamp will assume the consult service tomorrow.  Thank you for the courtesy of this consult.  Please contact me with any questions or concerns.  Nelida Meuse III Pager: [phone] Mon-Fri 8a-5p [phone] after 5p, weekends, holidays

## 2016-09-22 ENCOUNTER — Observation Stay (HOSPITAL_COMMUNITY): Payer: Medicare PPO | Admitting: Certified Registered Nurse Anesthetist

## 2016-09-22 ENCOUNTER — Encounter (HOSPITAL_COMMUNITY): Payer: Self-pay

## 2016-09-22 ENCOUNTER — Encounter (HOSPITAL_COMMUNITY)
Admission: EM | Disposition: A | Discharge status: Home / Self Care | Payer: Self-pay | Source: Home / Self Care | Attending: Emergency Medicine

## 2016-09-22 DIAGNOSIS — K259 Gastric ulcer, unspecified as acute or chronic, without hemorrhage or perforation: Secondary | ICD-10-CM | POA: Diagnosis not present

## 2016-09-22 DIAGNOSIS — C169 Malignant neoplasm of stomach, unspecified: Secondary | ICD-10-CM

## 2016-09-22 DIAGNOSIS — K2970 Gastritis, unspecified, without bleeding: Secondary | ICD-10-CM

## 2016-09-22 DIAGNOSIS — M546 Pain in thoracic spine: Secondary | ICD-10-CM | POA: Diagnosis not present

## 2016-09-22 DIAGNOSIS — C7951 Secondary malignant neoplasm of bone: Secondary | ICD-10-CM

## 2016-09-22 DIAGNOSIS — G8929 Other chronic pain: Secondary | ICD-10-CM

## 2016-09-22 DIAGNOSIS — R1013 Epigastric pain: Secondary | ICD-10-CM | POA: Diagnosis not present

## 2016-09-22 DIAGNOSIS — C649 Malignant neoplasm of unspecified kidney, except renal pelvis: Secondary | ICD-10-CM | POA: Diagnosis not present

## 2016-09-22 DIAGNOSIS — D649 Anemia, unspecified: Secondary | ICD-10-CM

## 2016-09-22 DIAGNOSIS — J45991 Cough variant asthma: Secondary | ICD-10-CM | POA: Diagnosis not present

## 2016-09-22 DIAGNOSIS — R1012 Left upper quadrant pain: Secondary | ICD-10-CM | POA: Diagnosis not present

## 2016-09-22 DIAGNOSIS — R109 Unspecified abdominal pain: Secondary | ICD-10-CM | POA: Diagnosis not present

## 2016-09-22 HISTORY — PX: ESOPHAGOGASTRODUODENOSCOPY: SHX5428

## 2016-09-22 LAB — COMPREHENSIVE METABOLIC PANEL
ALBUMIN: 3.3 g/dL — AB (ref 3.5–5.0)
ALK PHOS: 42 U/L (ref 38–126)
ALT: 20 U/L (ref 14–54)
AST: 26 U/L (ref 15–41)
Anion gap: 9 (ref 5–15)
BUN: 7 mg/dL (ref 6–20)
CALCIUM: 7.2 mg/dL — AB (ref 8.9–10.3)
CO2: 17 mmol/L — AB (ref 22–32)
CREATININE: 1.05 mg/dL — AB (ref 0.44–1.00)
Chloride: 113 mmol/L — ABNORMAL HIGH (ref 101–111)
GFR calc Af Amer: 60 mL/min — ABNORMAL LOW (ref >60)
GFR calc non Af Amer: 51 mL/min — ABNORMAL LOW (ref >60)
GLUCOSE: 73 mg/dL (ref 65–99)
Potassium: 4 mmol/L (ref 3.5–5.1)
SODIUM: 139 mmol/L (ref 135–145)
Total Bilirubin: 1 mg/dL (ref 0.3–1.2)
Total Protein: 5.6 g/dL — ABNORMAL LOW (ref 6.5–8.1)

## 2016-09-22 LAB — CBC
HCT: 33.8 % — ABNORMAL LOW (ref 36.0–46.0)
Hemoglobin: 10.6 g/dL — ABNORMAL LOW (ref 12.0–15.0)
MCH: 31.2 pg (ref 26.0–34.0)
MCHC: 31.4 g/dL (ref 30.0–36.0)
MCV: 99.4 fL (ref 78.0–100.0)
PLATELETS: 176 10*3/uL (ref 150–400)
RBC: 3.4 MIL/uL — ABNORMAL LOW (ref 3.87–5.11)
RDW: 18.5 % — ABNORMAL HIGH (ref 11.5–15.5)
WBC: 3.5 10*3/uL — ABNORMAL LOW (ref 4.0–10.5)

## 2016-09-22 LAB — H. PYLORI ANTIBODY, IGG: H Pylori IgG: <0.8 Index Value (ref 0.00–0.79)

## 2016-09-22 SURGERY — EGD (ESOPHAGOGASTRODUODENOSCOPY)
Anesthesia: Monitor Anesthesia Care

## 2016-09-22 MED ORDER — PROPOFOL 10 MG/ML IV BOLUS
INTRAVENOUS | Status: AC
  Filled 2016-09-22: qty 40

## 2016-09-22 MED ORDER — PROPOFOL 500 MG/50ML IV EMUL
INTRAVENOUS | Status: DC | PRN
  Administered 2016-09-22: 200 ug/kg/min via INTRAVENOUS

## 2016-09-22 MED ORDER — SODIUM CHLORIDE 0.9 % IV SOLN
INTRAVENOUS | Status: DC
  Administered 2016-09-22: 13:00:00 via INTRAVENOUS

## 2016-09-22 MED ORDER — LIDOCAINE 2% (20 MG/ML) 5 ML SYRINGE
INTRAMUSCULAR | Status: DC | PRN
  Administered 2016-09-22: 100 mg via INTRAVENOUS

## 2016-09-22 MED ORDER — PANTOPRAZOLE SODIUM 40 MG PO TBEC: 40.0000 mg | DELAYED_RELEASE_TABLET | Freq: Two times a day (BID) | ORAL | 1 refills | Status: AC

## 2016-09-22 MED ORDER — MORPHINE SULFATE (PF) 10 MG/ML IV SOLN
2.0000 mg | INTRAVENOUS | Status: DC | PRN
  Filled 2016-09-22: qty 1

## 2016-09-22 MED ORDER — PROMETHAZINE HCL 25 MG PO TABS: 12.5000 mg | ORAL_TABLET | Freq: Four times a day (QID) | ORAL | Status: DC | PRN

## 2016-09-22 MED ORDER — LIDOCAINE 2% (20 MG/ML) 5 ML SYRINGE
INTRAMUSCULAR | Status: AC
  Filled 2016-09-22: qty 5

## 2016-09-22 MED ORDER — PROPOFOL 10 MG/ML IV BOLUS
INTRAVENOUS | Status: DC | PRN
  Administered 2016-09-22: 20 mg via INTRAVENOUS

## 2016-09-22 MED ORDER — SUCRALFATE 1 GM/10ML PO SUSP
1.0000 g | Freq: Three times a day (TID) | ORAL | Status: DC
  Administered 2016-09-22: 1 g via ORAL
  Filled 2016-09-22 (×2): qty 10

## 2016-09-22 MED ORDER — ONDANSETRON HCL 4 MG/2ML IJ SOLN
INTRAMUSCULAR | Status: AC
  Filled 2016-09-22: qty 2

## 2016-09-22 MED ORDER — HYDROCODONE-ACETAMINOPHEN 5-325 MG PO TABS
1.0000 | ORAL_TABLET | Freq: Four times a day (QID) | ORAL | Status: DC | PRN
  Administered 2016-09-22: 1 via ORAL
  Filled 2016-09-22: qty 1

## 2016-09-22 NOTE — Anesthesia Postprocedure Evaluation (Signed)
Anesthesia Post Note  Patient: Cathy Lewis  Procedure(s) Performed: Procedure(s) (LRB): ESOPHAGOGASTRODUODENOSCOPY (EGD) (N/A)     Patient location during evaluation: PACU Anesthesia Type: MAC Level of consciousness: awake Pain management: pain level controlled Vital Signs Assessment: post-procedure vital signs reviewed and stable Respiratory status: spontaneous breathing Cardiovascular status: stable Anesthetic complications: no    Last Vitals:  Vitals:   09/22/16 1345 09/22/16 1350  BP:  (!) 100/45  Pulse: 94 95  Resp: 13 13  Temp:      Last Pain:  Vitals:   09/22/16 1329  TempSrc: Oral  PainSc:                  Hiliana Eilts

## 2016-09-22 NOTE — Op Note (Signed)
[facility] Patient Name: Cathy Lewis Procedure Date: 09/22/2016 MRN: [id number] Attending MD: Mauri Pole , MD Date of Birth: 09-17-1945 CSN: [id number] Age: 71 Admit Type: Inpatient Procedure:                Upper GI endoscopy Indications:              Epigastric abdominal pain Providers:                Mauri Pole, MD, Laverta Baltimore RN, RN,                            Elspeth Cho Tech., Technician Referring MD:              Medicines:                Monitored Anesthesia Care Complications:            No immediate complications. Estimated Blood Loss:     Estimated blood loss was minimal. Procedure:                Pre-Anesthesia Assessment:                           - Prior to the procedure, a History and Physical                            was performed, and patient medications and                            allergies were reviewed. The patient's tolerance of                            previous anesthesia was also reviewed. The risks                            and benefits of the procedure and the sedation                            options and risks were discussed with the patient.                            All questions were answered, and informed consent                            was obtained. Prior Anticoagulants: The patient                            last took Lovenox (enoxaparin) 1 day prior to the                            procedure. ASA Grade Assessment: III - A patient                            with severe systemic disease. After reviewing the  risks and benefits, the patient was deemed in                            satisfactory condition to undergo the procedure.                           After obtaining informed consent, the endoscope was                            passed under direct vision. Throughout the                            procedure, the patient's blood pressure, pulse, and           oxygen saturations were monitored continuously. The                            Endoscope was introduced through the mouth, and                            advanced to the second part of duodenum. The upper                            GI endoscopy was accomplished without difficulty.                            The patient tolerated the procedure well. Scope In: Scope Out: Findings:      The esophagus was normal.      Patchy minimal inflammation characterized by congestion (edema),       erosions, erythema and friability was found in the entire examined       stomach. Biopsies were taken with a cold forceps for Helicobacter pylori       testing.      One non-bleeding cratered gastric ulcer with healed up margins and no       stigmata of bleeding was found in the gastric body. The lesion was 7-8       mm in largest dimension. Biopsies were taken with a cold forceps for       histology.      The examined duodenum was normal. Impression:               - Normal esophagus.                           - Gastritis. Biopsied.                           - Non-bleeding gastric ulcer with no stigmata of                            bleeding. Biopsied.                           - Normal examined duodenum. Moderate Sedation:      N/A- Per Anesthesia Care Recommendation:           - Resume previous diet.                           -  Continue present medications.                           - No ibuprofen, naproxen, or other non-steroidal                            anti-inflammatory drugs.                           - Await pathology results.                           - Return to GI office PRN. Procedure Code(s):        --- Professional ---                           [phone], Esophagogastroduodenoscopy, flexible,                            transoral; with biopsy, single or multiple Diagnosis Code(s):        --- Professional ---                           K29.70, Gastritis, unspecified, without bleeding                            K25.9, Gastric ulcer, unspecified as acute or                            chronic, without hemorrhage or perforation                           R10.13, Epigastric pain CPT copyright 2016 [employer]. All rights reserved. The codes documented in this report are preliminary and upon coder review may  be revised to meet current compliance requirements. Mauri Pole, MD 09/22/2016 1:32:00 PM This report has been signed electronically. Number of Addenda: 0

## 2016-09-22 NOTE — Discharge Summary (Signed)
Physician Discharge Summary  Cathy Lewis XBM:[id number] DOB: [birth year] DOA: 09/21/2016  PCP: Christain Sacramento, MD  Admit date: 09/21/2016 Discharge date: 09/22/2016  Admitted From: home Disposition:  home  Recommendations for Outpatient Follow-up:  1. Follow up with Dr Marin Olp as scheduled  Home Health: none Equipment/Devices: none  Discharge Condition: stable CODE STATUS: Full code Diet recommendation: regular  HPI: Per Dr. Maudie Mercury, Cathy Lewis  is a 71 y.o. 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  Upper abdomen, left and right and center, and radiates towards the back.  Fairly constant. Slight nausea. Pt denies NSAIDS use.  Pt is only taking zantac currently,  Not omeprazole.  Denies fever, chills, emesis, diarrhea, brbpr, black stool. Prior CT scan abd/pelvis 7/27 => left nephrectomy, continue presence of pulmonary , osseous, left hepatic and adrenal metastasis. 1.3 cm partially enhancing solid mass concerning for possible malignancy. Pt presented to ED for evaluation of abdominal pain.  In ED,  Lipase 19, ua negative.  Ast 28, Alt 21, Alk phos 46, T. Bili 0.7   Pt was seen by GI,  EGD for tomorrow am.  Pt will be admitted for w/up of abdominal pain.    Hospital Course: Discharge Diagnoses:  Principal Problem:   Abdominal pain Active Problems:   Malignant neoplasm metastatic to bone (HCC)   Carcinoma of kidney (HCC)   Anemia   Chronic left-sided thoracic back pain   Epigastric abdominal pain -patient was admitted to the hospital with epigastric abdominal pain.  She had 2 ED visits prior to this hospitalization in which he underwent CT scan of the abdomen and pelvis which other than known metastatic malignancy there were no acute findings. She was eventually hospitalized for gastroenterology evaluation.  She underwent an EGD on 09/22/2016 which revealed inflammatory changes with edema, erosions, erythema and  friability in the entire examined stomach, as well as a non-bleeding cratered gastric ulcer without stigmata of bleeding in the gastric body.  Biopsies were taken, and gastroenterology will follow-up biopsy results as an outpatient.  Possible that her pain was caused by the above-mentioned findings, will place patient on twice daily PPI for now.  She was discharged home in stable condition. T12 compression fracture -This is old, she had ablation with vertebroplasty in April 2017.  There were concerns that her epigastric abdominal pain may be referred pain from the thoracic fracture, however patient's abdominal pain is reproducible with palpation on the anterior abdomen. Metastatic renal cell carcinoma -follow-up with [name] as an outpatient Hypertension -Continue Norvasc Cough variant asthma -Continue home medications, no wheezing, this is stable   Discharge Instructions   Allergies as of 09/22/2016      Reactions   Azithromycin Other (See Comments)   Stomach cramps.  Green black stool    Ciprofloxacin Other (See Comments)   Body aches Chills Heart races   Etodolac Palpitations, Other (See Comments)   Joints hurt   Gnp [acetaminophen] Other (See Comments)   "GRN" (cough syrup) Heart races   Moxifloxacin Palpitations, Other (See Comments)   Body ache   Tramadol Nausea And Vomiting, Other (See Comments)   Dizziness   Rofecoxib Other (See Comments)   Pt unsure       Medication List    STOP taking these medications   omeprazole 20 MG capsule Commonly known as:  PRILOSEC     TAKE these medications   ADVAIR DISKUS 100-50 MCG/DOSE Aepb Generic drug:  Fluticasone-Salmeterol Inhale 1 puff into the lungs 2 (two) times daily before a meal.   albuterol 90 MCG/ACT inhaler Commonly known as:  PROVENTIL,VENTOLIN Inhale 2 puffs into the lungs every 4 (four) hours as needed for wheezing.   amLODipine 5 MG tablet Commonly known as:  NORVASC Take 5 mg by mouth daily.   CABOMETYX  40 MG Tabs Generic drug:  cabozantinib S-Malate TAKE 1 TABLET BY MOUTH DAILY What changed:  See the new instructions. Notes to patient:  You reported that Dr. Jonette Eva told you to stop this medication   CALCIUM + D PO Take 1 tablet by mouth daily.   cephALEXin 500 MG capsule Commonly known as:  KEFLEX Take 500 mg by mouth 3 (three) times daily.   dicyclomine 20 MG tablet Commonly known as:  BENTYL Take 20 mg by mouth 3 (three) times daily as needed (Stomach Cramps).   fentaNYL 12 MCG/HR Commonly known as:  DURAGESIC - dosed mcg/hr Place 12.5 mcg onto the skin every 3 (three) days.   fluticasone 50 MCG/ACT nasal spray Commonly known as:  FLONASE Place 1 spray into both nostrils daily.   HYDROcodone-acetaminophen 5-325 MG tablet Commonly known as:  NORCO Take 1 tablet by mouth every 6 (six) hours as needed.   Magnesium 250 MG Tabs Take 250 mg by mouth daily.   metoCLOPramide 10 MG tablet Commonly known as:  REGLAN Take 1 tablet (10 mg total) by mouth every 8 (eight) hours as needed for nausea or vomiting.   montelukast 10 MG tablet Commonly known as:  SINGULAIR Take 10 mg by mouth at bedtime.   nystatin 100000 UNIT/ML suspension Commonly known as:  MYCOSTATIN Take 5 mLs (500,000 Units total) by mouth 4 (four) times daily.   OSTEO BI-FLEX ADV DOUBLE ST PO Take 1 tablet by mouth daily.   pantoprazole 40 MG tablet Commonly known as:  PROTONIX Take 1 tablet (40 mg total) by mouth 2 (two) times daily before a meal.   pilocarpine 5 MG tablet Commonly known as:  SALAGEN Take 1 tablet (5 mg total) by mouth 2 (two) times daily.   predniSONE 5 MG tablet Commonly known as:  DELTASONE Take 1 tablet (5 mg total) by mouth daily with breakfast.   PROBIOTIC DAILY PO Take 1 tablet by mouth daily.   promethazine 12.5 MG tablet Commonly known as:  PHENERGAN Take 1 tablet (12.5 mg total) by mouth every 6 (six) hours as needed for nausea or vomiting.   ranitidine 150 MG  tablet Commonly known as:  ZANTAC Take 1 tablet (150 mg total) by mouth 2 (two) times daily.   sucralfate 1 GM/10ML suspension Commonly known as:  CARAFATE Take 10 mLs (1 g total) by mouth 4 (four) times daily -  with meals and at bedtime.   vitamin C 500 MG tablet Commonly known as:  ASCORBIC ACID Take 500 mg by mouth daily.   XGEVA 120 MG/1.7ML Soln injection Generic drug:  denosumab Inject 120 mg into the skin every 30 (thirty) days.      Follow-up Information    Volanda Napoleon, MD Follow up.   Specialty:  Oncology Why:  as scheduled Contact information: [address] [place] [address] [phone]          Allergies  Allergen Reactions  . Azithromycin Other (See Comments)    Stomach cramps.  Green black stool   . Ciprofloxacin Other (See Comments)    Body aches Chills Heart races  .  Etodolac Palpitations and Other (See Comments)    Joints hurt   . Gnp [Acetaminophen] Other (See Comments)    "GRN" (cough syrup) Heart races  . Moxifloxacin Palpitations and Other (See Comments)    Body ache  . Tramadol Nausea And Vomiting and Other (See Comments)    Dizziness  . Rofecoxib Other (See Comments)    Pt unsure     Consultations:  Gastroenterology  Procedures/Studies:  EGD The esophagus was normal. Findings: Patchy minimal inflammation characterized by congestion (edema), erosions, erythema and friability was found in the entire examined stomach. Biopsies were taken with a cold forceps for Helicobacter pylori testing. One non-bleeding cratered gastric ulcer with healed up margins and no stigmata of bleeding was found in the gastric body. The lesion was 7-8 mm in largest dimension. Biopsies were taken with a cold forceps for histology.   Ct Abdomen Pelvis Wo Contrast  Result Date: 09/13/2016 CLINICAL DATA:  Abdominal pain and bloating 1-2 weeks. History of metastatic renal cell carcinoma. EXAM: CT ABDOMEN AND PELVIS WITHOUT  CONTRAST TECHNIQUE: Multidetector CT imaging of the abdomen and pelvis was performed following the standard protocol without IV contrast. COMPARISON:  09/11/2016 FINDINGS: Lower chest: Multiple small nodules over the lung bases unchanged compatible known metastatic disease. Small hiatal hernia. Hepatobiliary: Previous cholecystectomy. Stable hypodense mass over the far lateral aspect of the left lobe of the liver abutting the lesser curvature of the stomach. Pancreas: Within normal. Spleen: Within normal. Adrenals/Urinary Tract: Bilateral renal masses unchanged compatible with metastatic disease. Evidence of previous left nephrectomy. Right kidney is normal in size without hydronephrosis or nephrolithiasis. Suggestion of a couple small right renal cysts unchanged. Right ureter and bladder are normal. Stomach/Bowel: Small hiatal hernia. Small bowel is within normal. Previous appendectomy. Colon is within normal. Vascular/Lymphatic: Mild-to-moderate calcified plaque over the abdominal aorta and iliac arteries. Few small periaortic lymph nodes unchanged. Reproductive: Previous hysterectomy.  Ovaries not visualized. Other: No free fluid or inflammatory change. Musculoskeletal: Known stable metastatic disease throughout the bony structures predominately including the spine and sternum. Stable T12 compression fracture post kyphoplasty. IMPRESSION: No evidence of bowel obstruction. No acute findings in the abdomen/pelvis. No significant change in patient's known metastatic renal cell carcinoma with involvement of lung, liver, adrenal glands and bones. Stable moderate pathologic T12 compression fracture post kyphoplasty. Few small bilateral renal cysts unchanged. Aortic Atherosclerosis (ICD10-I70.0). Electronically Signed   By: Marin Olp M.D.   On: 09/13/2016 16:18   Dg Thoracic Spine 2 View  Result Date: 09/21/2016 CLINICAL DATA:  Chronic back pain. Abdominal and epigastric pain for several weeks. No known injury.  History of metastatic renal cell carcinoma per previous radiology reports. EXAM: THORACIC SPINE 2 VIEWS COMPARISON:  Chest CT and whole body bone scan 09/11/2016. FINDINGS: No visible ribs at T12. There is a stable chronic fracture at T12 with asymmetric loss of vertebral body height on the right and a resulting convex left scoliosis. Spinal augmentation has been performed at this level. Scattered small lytic lesions seen on CT are not discernible. No evidence of pathologic fracture or widening of the interpedicular distance. Lower cervical spondylosis noted. IMPRESSION: Stable radiographic appearance of the thoracic spine status post spinal augmentation for metastatic disease at T12. No acute findings seen. Electronically Signed   By: Richardean Sale M.D.   On: 09/21/2016 13:38   Ct Abdomen Pelvis W Contrast  Result Date: 09/19/2016 CLINICAL DATA:  Epigastric abdominal pain for 3 weeks. EXAM: CT ABDOMEN AND PELVIS WITH CONTRAST TECHNIQUE: Multidetector  CT imaging of the abdomen and pelvis was performed using the standard protocol following bolus administration of intravenous contrast. CONTRAST:  28m ISOVUE-300 IOPAMIDOL (ISOVUE-300) INJECTION 61% COMPARISON:  CT scan of September 13, 2016. FINDINGS: Lower chest: Multiple pulmonary metastases are seen in both lung bases. Hepatobiliary: Status post cholecystectomy. Stable left hepatic mass seen consistent with metastatic disease. Pancreas: Unremarkable. No pancreatic ductal dilatation or surrounding inflammatory changes. Spleen: Normal in size without focal abnormality. Adrenals/Urinary Tract: Stable bilateral adrenal masses are noted concerning for metastatic disease, left greater than right. Status post left nephrectomy. No hydronephrosis or renal obstruction is seen involving the right kidney. Multiple cysts are noted. 1.3 cm partially enhancing solid mass is seen in midpole cortex of right kidney concerning for malignancy. Urinary bladder is decompressed.  Stomach/Bowel: The stomach is unremarkable. Status post appendectomy. There is no evidence of bowel obstruction or inflammation. Vascular/Lymphatic: Aortic atherosclerosis. No enlarged abdominal or pelvic lymph nodes. Reproductive: Status post hysterectomy. No adnexal masses. Other: No abdominal wall hernia or abnormality. No abdominopelvic ascites. Musculoskeletal: Multiple metastatic lesions are noted in the spine with stable pathologic fracture of T12 vertebral body. IMPRESSION: Continued presence of pulmonary, osseous, left hepatic and adrenal metastases. Aortic atherosclerosis. Status post left nephrectomy. Multiple cysts are noted in the right kidney, although there is a 1.3 cm partially enhancing solid mass seen in midpole cortex of right kidney concerning for possible malignancy. MRI may be performed further evaluation. Electronically Signed   By: JMarijo Conception M.D.   On: 09/19/2016 14:52      Subjective: -Feels well, complains of abdominal pain as above, no nausea or vomiting  Discharge Exam: Vitals:   09/22/16 1412 09/22/16 1705  BP: (!) 98/49 (!) 101/48  Pulse: 97 95  Resp: 16   Temp: 98.1 F (36.7 C)    Vitals:   09/22/16 1345 09/22/16 1350 09/22/16 1412 09/22/16 1705  BP:  (!) 100/45 (!) 98/49 (!) 101/48  Pulse: 94 95 97 95  Resp: '13 13 16   ' Temp:   98.1 F (36.7 C)   TempSrc:   Oral   SpO2: 94% 95% 95%   Weight:      Height:        General: Pt is alert, awake, not in acute distress Cardiovascular: RRR, S1/S2 +, no rubs, no gallops Respiratory: CTA bilaterally, no wheezing, no rhonchi Abdominal: Soft, minimally tender in the epigastric area, ND, bowel sounds + Extremities: no edema, no cyanosis    The results of significant diagnostics from this hospitalization (including imaging, microbiology, ancillary and laboratory) are listed below for reference.     Microbiology: Recent Results (from the past 240 hour(s))  Urine culture     Status: Abnormal    Collection Time: 09/13/16  2:17 PM  Result Value Ref Range Status   Specimen Description URINE, CLEAN CATCH  Final   Special Requests Normal  Final   Culture 80,000 COLONIES/mL PSEUDOMONAS AERUGINOSA (A)  Final   Report Status 09/16/2016 FINAL  Final   Organism ID, Bacteria PSEUDOMONAS AERUGINOSA (A)  Final      Susceptibility   Pseudomonas aeruginosa - MIC*    CEFTAZIDIME 2 SENSITIVE Sensitive     CIPROFLOXACIN <=0.25 SENSITIVE Sensitive     GENTAMICIN <=1 SENSITIVE Sensitive     IMIPENEM 1 SENSITIVE Sensitive     PIP/TAZO <=4 SENSITIVE Sensitive     CEFEPIME <=1 SENSITIVE Sensitive     * 80,000 COLONIES/mL PSEUDOMONAS AERUGINOSA     Labs: BNP (last 3  results) No results for input(s): BNP in the last 8760 hours. Basic Metabolic Panel:  Recent Labs Lab 09/18/16 1410 09/19/16 0959 09/19/16 1019 09/21/16 1141 09/22/16 0431  NA 136 135 138 139 139  K 4.8* 3.9 3.7 3.9 4.0  CL 104 107 103 111 113*  CO2 25 20*  --  20* 17*  GLUCOSE 97 79 79 89 73  BUN '10 11 11 8 7  ' CREATININE 1.4* 1.33* 1.20* 0.94 1.05*  CALCIUM 8.7 8.0*  --  7.7* 7.2*   Liver Function Tests:  Recent Labs Lab 09/18/16 1410 09/19/16 0959 09/21/16 1141 09/22/16 0431  AST 34 '26 28 26  ' ALT 32 '21 21 20  ' ALKPHOS 59 49 46 42  BILITOT 1.00 0.4 0.7 1.0  PROT 6.9 5.8* 6.1* 5.6*  ALBUMIN 3.7 3.7 3.6 3.3*    Recent Labs Lab 09/19/16 0959 09/21/16 1141  LIPASE 33 19   No results for input(s): AMMONIA in the last 168 hours. CBC:  Recent Labs Lab 09/18/16 1410 09/19/16 0959 09/19/16 1019 09/21/16 1141 09/22/16 0431  WBC 4.0 3.2*  --  3.3* 3.5*  NEUTROABS 2.0 1.6*  --  1.7  --   HGB 12.9 11.9* 12.2 10.7* 10.6*  HCT 39.6 36.3 36.0 33.3* 33.8*  MCV 98 97.8  --  96.8 99.4  PLT 201 193  --  171 176   Cardiac Enzymes:  Recent Labs Lab 09/19/16 0959  TROPONINI <0.03   BNP: Invalid input(s): POCBNP CBG: No results for input(s): GLUCAP in the last 168 hours. D-Dimer No results for input(s):  DDIMER in the last 72 hours. Hgb A1c No results for input(s): HGBA1C in the last 72 hours. Lipid Profile No results for input(s): CHOL, HDL, LDLCALC, TRIG, CHOLHDL, LDLDIRECT in the last 72 hours. Thyroid function studies No results for input(s): TSH, T4TOTAL, T3FREE, THYROIDAB in the last 72 hours.  Invalid input(s): FREET3 Anemia work up No results for input(s): VITAMINB12, FOLATE, FERRITIN, TIBC, IRON, RETICCTPCT in the last 72 hours. Urinalysis    Component Value Date/Time   COLORURINE COLORLESS (A) 09/21/2016 1250   APPEARANCEUR CLEAR 09/21/2016 1250   LABSPEC 1.004 (L) 09/21/2016 1250   LABSPEC 1.020 09/25/2015 1056   PHURINE 7.0 09/21/2016 1250   GLUCOSEU NEGATIVE 09/21/2016 1250   HGBUR NEGATIVE 09/21/2016 1250   BILIRUBINUR NEGATIVE 09/21/2016 1250   KETONESUR 20 (A) 09/21/2016 1250   PROTEINUR NEGATIVE 09/21/2016 1250   UROBILINOGEN 0.2 09/25/2015 1056   NITRITE NEGATIVE 09/21/2016 1250   LEUKOCYTESUR SMALL (A) 09/21/2016 1250   Sepsis Labs Invalid input(s): PROCALCITONIN,  WBC,  LACTICIDVEN Microbiology Recent Results (from the past 240 hour(s))  Urine culture     Status: Abnormal   Collection Time: 09/13/16  2:17 PM  Result Value Ref Range Status   Specimen Description URINE, CLEAN CATCH  Final   Special Requests Normal  Final   Culture 80,000 COLONIES/mL PSEUDOMONAS AERUGINOSA (A)  Final   Report Status 09/16/2016 FINAL  Final   Organism ID, Bacteria PSEUDOMONAS AERUGINOSA (A)  Final      Susceptibility   Pseudomonas aeruginosa - MIC*    CEFTAZIDIME 2 SENSITIVE Sensitive     CIPROFLOXACIN <=0.25 SENSITIVE Sensitive     GENTAMICIN <=1 SENSITIVE Sensitive     IMIPENEM 1 SENSITIVE Sensitive     PIP/TAZO <=4 SENSITIVE Sensitive     CEFEPIME <=1 SENSITIVE Sensitive     * 80,000 COLONIES/mL PSEUDOMONAS AERUGINOSA     Time coordinating discharge: 37 minutes  SIGNED:  Marzetta Board,  MD  [facility] 09/22/2016, 5:12 PM Pager [phone]  If  7PM-7AM, please contact night-coverage [email or web address] Password [email or web address]

## 2016-09-22 NOTE — Transfer of Care (Signed)
Immediate Anesthesia Transfer of Care Note  Patient: Cathy Lewis  Procedure(s) Performed: Procedure(s): ESOPHAGOGASTRODUODENOSCOPY (EGD) (N/A)  Patient Location: PACU  Anesthesia Type:MAC  Level of Consciousness:  sedated, patient cooperative and responds to stimulation  Airway & Oxygen Therapy:Patient Spontanous Breathing and Patient connected to face mask oxgen  Post-op Assessment:  Report given to PACU RN and Post -op Vital signs reviewed and stable  Post vital signs:  Reviewed and stable  Last Vitals:  Vitals:   09/22/16 0522 09/22/16 1236  BP: 122/64 133/63  Pulse: (!) 101 98  Resp: 14 11  Temp: 36.8 C 12.2 C    Complications: No apparent anesthesia complications

## 2016-09-22 NOTE — Progress Notes (Deleted)
PROGRESS NOTE  Cathy Lewis PNT:[id number] DOB: May 04, 1945 DOA: 09/21/2016 PCP: Christain Sacramento, MD   LOS: 0 days   Brief Narrative / Interim history: 71 y.o. 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  Upper abdomen, left and right and center, and radiates towards the back.  Fairly constant.  Assessment & Plan: Principal Problem:   Abdominal pain Active Problems:   Malignant neoplasm metastatic to bone (HCC)   Carcinoma of kidney (HCC)   Anemia   Epigastric abdominal pain -Pain is exacerbated by deep palpation on my exam today -Has been constant for the past 2-3 weeks, no relationship to food -no nausea and vomiting -Gastroenterology consulted, appreciate input, patient will undergo an endoscopy today  T12 compression fracture -This is old, she had ablation with vertebroplasty in April 2017 -Patient's abdominal pain is reproducible with palpation on the anterior abdomen, unlikely that the compression fracture contributes to this pain  Metastatic renal cell carcinoma -Apparently her chemotherapy is on hold while working upper abdominal pain  Hypertension -Continue Norvasc  Cough variant asthma -Continue home medications, no wheezing, this is stable   DVT prophylaxis: Lovenox Code Status: Full code Family Communication: husband bedside Disposition Plan: home when ready   Consultants:   Gastroenterology  Procedures:   None   Antimicrobials:  None    Subjective: -no chest pain, shortness of breath -Complains of abdominal pain, ongoing for the past weeks.   Objective: Vitals:   09/21/16 2029 09/21/16 2058 09/22/16 0522 09/22/16 0801  BP: 131/75  122/64   Pulse: (!) 104  (!) 101   Resp: 16  14   Temp: 98 F (36.7 C)  98.2 F (36.8 C)   TempSrc: Oral  Oral   SpO2: 100% 95% 95% 92%  Weight:   54.2 kg (119 lb 6.4 oz)   Height:        Intake/Output Summary (Last 24 hours) at 09/22/16  1216 Last data filed at 09/22/16 0935  Gross per 24 hour  Intake           1942.5 ml  Output              450 ml  Net           1492.5 ml   Filed Weights   09/21/16 1700 09/22/16 0522  Weight: 56.4 kg (124 lb 5.8 oz) 54.2 kg (119 lb 6.4 oz)    Examination:  Vitals:   09/21/16 2029 09/21/16 2058 09/22/16 0522 09/22/16 0801  BP: 131/75  122/64   Pulse: (!) 104  (!) 101   Resp: 16  14   Temp: 98 F (36.7 C)  98.2 F (36.8 C)   TempSrc: Oral  Oral   SpO2: 100% 95% 95% 92%  Weight:   54.2 kg (119 lb 6.4 oz)   Height:        Constitutional: NAD Eyes: PERRL, lids and conjunctivae normal Respiratory: clear to auscultation bilaterally, no wheezing, no crackles. Normal respiratory effort.  Cardiovascular: Regular rate and rhythm, no murmurs / rubs / gallops. No LE edema. 2+ pedal pulses. Abdomen: + tenderness epigastric area.  No guarding, no rebound. Bowel sounds positive.  Musculoskeletal: no clubbing / cyanosis. Neurologic: CN 2-12 grossly intact. Strength 5/5 in all 4.  Psychiatric: Normal judgment and insight. Alert and oriented x 3. Normal mood.    Data Reviewed: I have independently reviewed following labs and imaging studies   CBC:  Recent  Labs Lab 09/18/16 1410 09/19/16 0959 09/19/16 1019 09/21/16 1141 09/22/16 0431  WBC 4.0 3.2*  --  3.3* 3.5*  NEUTROABS 2.0 1.6*  --  1.7  --   HGB 12.9 11.9* 12.2 10.7* 10.6*  HCT 39.6 36.3 36.0 33.3* 33.8*  MCV 98 97.8  --  96.8 99.4  PLT 201 193  --  171 644   Basic Metabolic Panel:  Recent Labs Lab 09/18/16 1410 09/19/16 0959 09/19/16 1019 09/21/16 1141 09/22/16 0431  NA 136 135 138 139 139  K 4.8* 3.9 3.7 3.9 4.0  CL 104 107 103 111 113*  CO2 25 20*  --  20* 17*  GLUCOSE 97 79 79 89 73  BUN 10 11 11 8 7   CREATININE 1.4* 1.33* 1.20* 0.94 1.05*  CALCIUM 8.7 8.0*  --  7.7* 7.2*   GFR: Estimated Creatinine Clearance: 40.8 mL/min (A) (by C-G formula based on SCr of 1.05 mg/dL (H)). Liver Function  Tests:  Recent Labs Lab 09/18/16 1410 09/19/16 0959 09/21/16 1141 09/22/16 0431  AST 34 26 28 26   ALT 32 21 21 20   ALKPHOS 59 49 46 42  BILITOT 1.00 0.4 0.7 1.0  PROT 6.9 5.8* 6.1* 5.6*  ALBUMIN 3.7 3.7 3.6 3.3*    Recent Labs Lab 09/19/16 0959 09/21/16 1141  LIPASE 33 19   No results for input(s): AMMONIA in the last 168 hours. Coagulation Profile: No results for input(s): INR, PROTIME in the last 168 hours. Cardiac Enzymes:  Recent Labs Lab 09/19/16 0959  TROPONINI <0.03   BNP (last 3 results) No results for input(s): PROBNP in the last 8760 hours. HbA1C: No results for input(s): HGBA1C in the last 72 hours. CBG: No results for input(s): GLUCAP in the last 168 hours. Lipid Profile: No results for input(s): CHOL, HDL, LDLCALC, TRIG, CHOLHDL, LDLDIRECT in the last 72 hours. Thyroid Function Tests: No results for input(s): TSH, T4TOTAL, FREET4, T3FREE, THYROIDAB in the last 72 hours. Anemia Panel: No results for input(s): VITAMINB12, FOLATE, FERRITIN, TIBC, IRON, RETICCTPCT in the last 72 hours. Urine analysis:    Component Value Date/Time   COLORURINE COLORLESS (A) 09/21/2016 1250   APPEARANCEUR CLEAR 09/21/2016 1250   LABSPEC 1.004 (L) 09/21/2016 1250   LABSPEC 1.020 09/25/2015 1056   PHURINE 7.0 09/21/2016 1250   GLUCOSEU NEGATIVE 09/21/2016 1250   HGBUR NEGATIVE 09/21/2016 1250   BILIRUBINUR NEGATIVE 09/21/2016 1250   KETONESUR 20 (A) 09/21/2016 1250   PROTEINUR NEGATIVE 09/21/2016 1250   UROBILINOGEN 0.2 09/25/2015 1056   NITRITE NEGATIVE 09/21/2016 1250   LEUKOCYTESUR SMALL (A) 09/21/2016 1250   Sepsis Labs: Invalid input(s): PROCALCITONIN, LACTICIDVEN  Recent Results (from the past 240 hour(s))  Urine culture     Status: Abnormal   Collection Time: 09/13/16  2:17 PM  Result Value Ref Range Status   Specimen Description URINE, CLEAN CATCH  Final   Special Requests Normal  Final   Culture 80,000 COLONIES/mL PSEUDOMONAS AERUGINOSA (A)  Final    Report Status 09/16/2016 FINAL  Final   Organism ID, Bacteria PSEUDOMONAS AERUGINOSA (A)  Final      Susceptibility   Pseudomonas aeruginosa - MIC*    CEFTAZIDIME 2 SENSITIVE Sensitive     CIPROFLOXACIN <=0.25 SENSITIVE Sensitive     GENTAMICIN <=1 SENSITIVE Sensitive     IMIPENEM 1 SENSITIVE Sensitive     PIP/TAZO <=4 SENSITIVE Sensitive     CEFEPIME <=1 SENSITIVE Sensitive     * 80,000 COLONIES/mL PSEUDOMONAS AERUGINOSA  Radiology Studies: Dg Thoracic Spine 2 View  Result Date: 09/21/2016 CLINICAL DATA:  Chronic back pain. Abdominal and epigastric pain for several weeks. No known injury. History of metastatic renal cell carcinoma per previous radiology reports. EXAM: THORACIC SPINE 2 VIEWS COMPARISON:  Chest CT and whole body bone scan 09/11/2016. FINDINGS: No visible ribs at T12. There is a stable chronic fracture at T12 with asymmetric loss of vertebral body height on the right and a resulting convex left scoliosis. Spinal augmentation has been performed at this level. Scattered small lytic lesions seen on CT are not discernible. No evidence of pathologic fracture or widening of the interpedicular distance. Lower cervical spondylosis noted. IMPRESSION: Stable radiographic appearance of the thoracic spine status post spinal augmentation for metastatic disease at T12. No acute findings seen. Electronically Signed   By: Richardean Sale M.D.   On: 09/21/2016 13:38     Scheduled Meds: . amLODipine  5 mg Oral Daily  . cabozantinib S-Malate  40 mg Oral QODAY  . enoxaparin (LOVENOX) injection  40 mg Subcutaneous Q24H  . fentaNYL  12.5 mcg Transdermal Q72H  . fluticasone  1 spray Each Nare Daily  . lidocaine  1 patch Transdermal Q24H  . magnesium oxide  200 mg Oral Daily  . mometasone-formoterol  2 puff Inhalation BID  . montelukast  10 mg Oral QHS  . pantoprazole (PROTONIX) IV  40 mg Intravenous Q12H  . predniSONE  5 mg Oral Q breakfast   Continuous Infusions:  Marzetta Board, MD,  PhD [facility] Pager [phone]  If 7PM-7AM, please contact night-coverage [email or web address] Password [email or web address] 09/22/2016, 12:16 PM

## 2016-09-22 NOTE — Anesthesia Preprocedure Evaluation (Signed)
Anesthesia Evaluation  Patient identified by MRN, date of birth, ID band Patient awake    Reviewed: Allergy & Precautions, NPO status , Patient's Chart, lab work & pertinent test results  History of Anesthesia Complications (+) PONV  Airway Mallampati: II  TM Distance: >3 FB     Dental   Pulmonary asthma , former smoker,    breath sounds clear to auscultation       Cardiovascular hypertension,  Rhythm:Regular Rate:Normal     Neuro/Psych  Headaches,    GI/Hepatic Neg liver ROS, GERD  ,  Endo/Other    Renal/GU Renal disease     Musculoskeletal  (+) Arthritis ,   Abdominal   Peds  Hematology  (+) anemia ,   Anesthesia Other Findings   Reproductive/Obstetrics                             Anesthesia Physical Anesthesia Plan  ASA: III  Anesthesia Plan: MAC   Post-op Pain Management:    Induction: Intravenous  PONV Risk Score and Plan: 3 and Ondansetron, Dexamethasone, Midazolam and Propofol infusion  Airway Management Planned: Nasal ETT  Additional Equipment:   Intra-op Plan:   Post-operative Plan:   Informed Consent: I have reviewed the patients History and Physical, chart, labs and discussed the procedure including the risks, benefits and alternatives for the proposed anesthesia with the patient or authorized representative who has indicated his/her understanding and acceptance.   Dental advisory given  Plan Discussed with: CRNA and Anesthesiologist  Anesthesia Plan Comments:         Anesthesia Quick Evaluation

## 2016-09-22 NOTE — Discharge Instructions (Signed)
Follow with [name] as scheduled  Avoid all NSAIDs  Please get a complete blood count and chemistry panel checked by your Primary MD at your next visit, and again as instructed by your Primary MD. Please get your medications reviewed and adjusted by your Primary MD.  Please request your Primary MD to go over all Hospital Tests and Procedure/Radiological results at the follow up, please get all Hospital records sent to your Prim MD by signing hospital release before you go home.  If you had Pneumonia of Lung problems at the Hospital: Please get a 2 view Chest X ray done in 6-8 weeks after hospital discharge or sooner if instructed by your Primary MD.  If you have Congestive Heart Failure: Please call your Cardiologist or Primary MD anytime you have any of the following symptoms:  1) 3 pound weight gain in 24 hours or 5 pounds in 1 week  2) shortness of breath, with or without a dry hacking cough  3) swelling in the hands, feet or stomach  4) if you have to sleep on extra pillows at night in order to breathe  Follow cardiac low salt diet and 1.5 lit/day fluid restriction.  If you have diabetes Accuchecks 4 times/day, Once in AM empty stomach and then before each meal. Log in all results and show them to your primary doctor at your next visit. If any glucose reading is under 80 or above 300 call your primary MD immediately.  If you have Seizure/Convulsions/Epilepsy: Please do not drive, operate heavy machinery, participate in activities at heights or participate in high speed sports until you have seen by Primary MD or a Neurologist and advised to do so again.  If you had Gastrointestinal Bleeding: Please ask your Primary MD to check a complete blood count within one week of discharge or at your next visit. Your endoscopic/colonoscopic biopsies that are pending at the time of discharge, will also need to followed by your Primary MD.  Get Medicines reviewed and adjusted. Please take  all your medications with you for your next visit with your Primary MD  Please request your Primary MD to go over all hospital tests and procedure/radiological results at the follow up, please ask your Primary MD to get all Hospital records sent to his/her office.  If you experience worsening of your admission symptoms, develop shortness of breath, life threatening emergency, suicidal or homicidal thoughts you must seek medical attention immediately by calling 911 or calling your MD immediately  if symptoms less severe.  You must read complete instructions/literature along with all the possible adverse reactions/side effects for all the Medicines you take and that have been prescribed to you. Take any new Medicines after you have completely understood and accpet all the possible adverse reactions/side effects.   Do not drive or operate heavy machinery when taking Pain medications.   Do not take more than prescribed Pain, Sleep and Anxiety Medications  Special Instructions: If you have smoked or chewed Tobacco  in the last 2 yrs please stop smoking, stop any regular Alcohol  and or any Recreational drug use.  Wear Seat belts while driving.  Please note You were cared for by a hospitalist during your hospital stay. If you have any questions about your discharge medications or the care you received while you were in the hospital after you are discharged, you can call the unit and asked to speak with the hospitalist on call if the hospitalist that took care of you is not  available. Once you are discharged, your primary care physician will handle any further medical issues. Please note that NO REFILLS for any discharge medications will be authorized once you are discharged, as it is imperative that you return to your primary care physician (or establish a relationship with a primary care physician if you do not have one) for your aftercare needs so that they can reassess your need for medications and  monitor your lab values.  You can reach the hospitalist office at phone [phone] or fax [phone]   If you do not have a primary care physician, you can call [phone] for a physician referral.  Activity: As tolerated with Full fall precautions use walker/cane & assistance as needed  Diet: regular  Disposition Home

## 2016-09-22 NOTE — H&P (Signed)
[facility] History and Physical   Primary Care Physician:  Christain Sacramento, MD   Reason for Procedure:   Epigastric pain Plan:    EGD with possible intervention     HPI: Cathy Lewis is a 71 y.o. female with metastatic renal ca, T12 compression fracture here for evaluation of epigastric pain.    Past Medical History:  Diagnosis Date  . Arthritis    arthritis in knees and back   . Asthma   . Atypical chest pain    NORMAL CARDIOLITE STUDY   . Complication of anesthesia    hard to wake up from umb hernia-was given alot of meds  . Depression   . Dyslipidemia   . GERD (gastroesophageal reflux disease)   . Headache   . Hypertension   . Metastatic renal cell carcinoma to bone ([place]) 11/20/2014  . Seasonal allergies   . Syncope    HISTORY OF    Past Surgical History:  Procedure Laterality Date  . ABDOMINAL HYSTERECTOMY  2004   rectocele  . APPENDECTOMY    . BUNIONECTOMY Left 01-11-2013   left foot  . BUNIONECTOMY Right 12-31-2006   with hammer toe  . CARPAL TUNNEL RELEASE     R  . CHOLECYSTECTOMY N/A 08/27/2015   Procedure: LAPAROSCOPIC CHOLECYSTECTOMY WITH INTRAOPERATIVE CHOLANGIOGRAM;  Surgeon: [name], MD;  Location: [facility];  Service: General;  Laterality: N/A;  . COLONOSCOPY    . CYSTOCELE REPAIR    . DILATION AND CURETTAGE OF UTERUS    . HERNIA REPAIR  2014    umb, right and left  . INGUINAL HERNIA REPAIR Left 01/03/2014   Procedure: LEFT INGUINAL HERNIA REPAIR ;  Surgeon: [name], MD;  Location: [facility];  Service: General;  Laterality: Left;  . INGUINAL HERNIA REPAIR Right 04/06/2015   Procedure: HERNIA REPAIR RIGHT  INGUINAL ADULT WITH MESH;  Surgeon: [name], MD;  Location: [facility];  Service: General;  Laterality: Right;  . INSERTION OF MESH Left 01/03/2014   Procedure: INSERTION OF MESH;  Surgeon: [name], MD;  Location: [facility];  Service: General;  Laterality: Left;  . INSERTION OF  MESH Right 04/06/2015   Procedure: INSERTION OF MESH;  Surgeon: [name], MD;  Location: [facility];  Service: General;  Laterality: Right;  . IR GENERIC HISTORICAL  07/03/2015   IR RADIOLOGIST EVAL & MGMT 07/03/2015 Marybelle Killings, MD [facility] INTERV RAD  . IR GENERIC HISTORICAL  05/24/2015   IR RADIOLOGIST EVAL & MGMT 05/24/2015 Marybelle Killings, MD [facility] INTERV RAD  . JOINT REPLACEMENT    . 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 OTHER SURGICAL HISTORY     bladder vault prolapse  . OTHER SURGICAL HISTORY     right foot bunion and hammer toe surgery   . OTHER SURGICAL HISTORY     right knee arthroscopic   . OTHER SURGICAL HISTORY     right arm ( underarm ) surgery removed ? cyst   . TOTAL KNEE ARTHROPLASTY  03/11/2011   Procedure: TOTAL KNEE ARTHROPLASTY;  Surgeon: Cynda Familia, MD;  Location: [facility];  Service: Orthopedics;  Laterality: Right;  . TUBAL LIGATION     1977    Prior to Admission medications   Medication Sig Start Date End Date Taking? Authorizing Provider  albuterol (PROVENTIL,VENTOLIN) 90 MCG/ACT inhaler Inhale 2 puffs into the lungs every 4 (four) hours as needed for wheezing.    Yes [provider]  amLODipine (NORVASC) 5 MG tablet Take 5 mg by mouth daily.  07/28/15  Yes [provider]  CABOMETYX 40 MG TABS TAKE 1 TABLET BY MOUTH DAILY Patient taking differently: TAKE 1 TABLET BY MOUTH EVERY OTHER DAY 03/31/16  Yes Ennever, Rudell Cobb, MD  Calcium Carbonate-Vitamin D (CALCIUM + D PO) Take 1 tablet by mouth daily.    Yes [provider]  cephALEXin (KEFLEX) 500 MG capsule Take 500 mg by mouth 3 (three) times daily. 09/15/16  Yes [provider]  denosumab (XGEVA) 120 MG/1.7ML SOLN injection Inject 120 mg into the skin every 30 (thirty) days.   Yes [provider]  fentaNYL (DURAGESIC - DOSED MCG/HR) 12 MCG/HR Place 12.5 mcg onto the skin every 3 (three) days.   Yes [provider]  fluticasone (FLONASE) 50 MCG/ACT nasal  spray Place 1 spray into both nostrils daily.   Yes [provider]  Fluticasone-Salmeterol (ADVAIR DISKUS) 100-50 MCG/DOSE AEPB Inhale 1 puff into the lungs 2 (two) times daily before a meal.   Yes [provider]  HYDROcodone-acetaminophen (NORCO) 5-325 MG tablet Take 1 tablet by mouth every 6 (six) hours as needed. 03/01/16  Yes Orlie Dakin, MD  Magnesium 250 MG TABS Take 250 mg by mouth daily.   Yes [provider]  metoCLOPramide (REGLAN) 10 MG tablet Take 1 tablet (10 mg total) by mouth every 8 (eight) hours as needed for nausea or vomiting. 09/13/16  Yes Duffy Bruce, MD  Misc Natural Products (OSTEO BI-FLEX ADV DOUBLE ST PO) Take 1 tablet by mouth daily.   Yes [provider]  montelukast (SINGULAIR) 10 MG tablet Take 10 mg by mouth at bedtime.    Yes [provider]  predniSONE (DELTASONE) 5 MG tablet Take 1 tablet (5 mg total) by mouth daily with breakfast. 03/31/16  Yes [place], Holli Humbles, NP  Probiotic Product (PROBIOTIC DAILY PO) Take 1 tablet by mouth daily.    Yes [provider]  ranitidine (ZANTAC) 150 MG tablet Take 1 tablet (150 mg total) by mouth 2 (two) times daily. 09/13/16 09/23/16 Yes Duffy Bruce, MD  sucralfate (CARAFATE) 1 GM/10ML suspension Take 10 mLs (1 g total) by mouth 4 (four) times daily -  with meals and at bedtime. 09/13/16  Yes Duffy Bruce, MD  vitamin C (ASCORBIC ACID) 500 MG tablet Take 500 mg by mouth daily.   Yes [provider]  dicyclomine (BENTYL) 20 MG tablet Take 20 mg by mouth 3 (three) times daily as needed (Stomach Cramps).    [provider]  nystatin (MYCOSTATIN) 100000 UNIT/ML suspension Take 5 mLs (500,000 Units total) by mouth 4 (four) times daily. Patient not taking: Reported on 09/21/2016 09/25/15   [place], Holli Humbles, NP  omeprazole (PRILOSEC) 20 MG capsule Take 1 capsule (20 mg total) by mouth daily. Patient not taking: Reported on 09/21/2016 09/19/16   Mesner, Corene Cornea,  MD  pilocarpine (SALAGEN) 5 MG tablet Take 1 tablet (5 mg total) by mouth 2 (two) times daily. Patient not taking: Reported on 09/21/2016 10/02/15   [place], Holli Humbles, NP  promethazine (PHENERGAN) 12.5 MG tablet Take 1 tablet (12.5 mg total) by mouth every 6 (six) hours as needed for nausea or vomiting. 09/03/16   Volanda Napoleon, MD    Current Facility-Administered Medications  Medication Dose Route Frequency Provider Last Rate Last Dose  . 0.9 %  sodium chloride infusion   Intravenous Continuous Doran Stabler, MD      . Doug Sou Hold]  acetaminophen (TYLENOL) tablet 650 mg  650 mg Oral Q6H PRN Jani Gravel, MD       Or  . Doug Sou Hold] acetaminophen (TYLENOL) suppository 650 mg  650 mg Rectal Q6H PRN Jani Gravel, MD      . Doug Sou Hold] albuterol (PROVENTIL) (2.5 MG/3ML) 0.083% nebulizer solution 2.5 mg  2.5 mg Inhalation Q4H PRN Jani Gravel, MD      . Doug Sou Hold] amLODipine (NORVASC) tablet 5 mg  5 mg Oral Daily Jani Gravel, MD   5 mg at 09/21/16 1819  . [MAR Hold] enoxaparin (LOVENOX) injection 40 mg  40 mg Subcutaneous Q24H Jani Gravel, MD   40 mg at 09/21/16 1726  . [MAR Hold] fentaNYL (Rossie - dosed mcg/hr) 12.5 mcg  12.5 mcg Transdermal Q72H Jani Gravel, MD   12.5 mcg at 09/21/16 1819  . [MAR Hold] fluticasone (FLONASE) 50 MCG/ACT nasal spray 1 spray  1 spray Each Nare Daily Jani Gravel, MD      . Doug Sou Hold] HYDROcodone-acetaminophen (NORCO/VICODIN) 5-325 MG per tablet 1-2 tablet  1-2 tablet Oral Q6H PRN Caren Griffins, MD      . Doug Sou Hold] lidocaine (LIDODERM) 5 % 1 patch  1 patch Transdermal Q24H Cardama, Grayce Sessions, MD   1 patch at 09/21/16 1425  . [MAR Hold] magnesium oxide (MAG-OX) tablet 200 mg  200 mg Oral Daily Jani Gravel, MD      . Doug Sou Hold] mometasone-formoterol [facility]) 100-5 MCG/ACT inhaler 2 puff  2 puff Inhalation BID Jani Gravel, MD   2 puff at 09/22/16 0801  . [MAR Hold] montelukast (SINGULAIR) tablet 10 mg  10 mg Oral QHS Jani Gravel, MD   10 mg at 09/21/16 2100  . [MAR Hold]  Morphine Sulfate (PF) SOLN 2 mg  2 mg Intravenous Q3H PRN Caren Griffins, MD      . Doug Sou Hold] ondansetron [facility]) injection 4 mg  4 mg Intravenous Q6H PRN Jani Gravel, MD      . Doug Sou Hold] pantoprazole (PROTONIX) injection 40 mg  40 mg Intravenous Q12H Jani Gravel, MD   40 mg at 09/22/16 1150  . [MAR Hold] predniSONE (DELTASONE) tablet 5 mg  5 mg Oral Q breakfast Jani Gravel, MD      . Doug Sou Hold] promethazine (PHENERGAN) tablet 12.5 mg  12.5 mg Oral Q6H PRN Caren Griffins, MD        Allergies as of 09/21/2016 - Review Complete 09/21/2016  Allergen Reaction Noted  . Azithromycin Other (See Comments) 12/01/2014  . Ciprofloxacin Other (See Comments) 08/22/2015  . Etodolac Palpitations and Other (See Comments) 02/21/2011  . Gnp [acetaminophen] Other (See Comments) 12/29/2012  . Moxifloxacin Palpitations and Other (See Comments) 04/03/2015  . Tramadol Nausea And Vomiting and Other (See Comments) 02/21/2011  . Rofecoxib Other (See Comments) 04/03/2015    Family History  Problem Relation Age of Onset  . Heart attack Father        DECEASED  . Cancer Mother        DECEASED-unsure of type  . Cancer Brother        LUNG CA- smoked  . Aneurysm Brother        AAA  . Asthma Sister     Social History   Social History  . Marital status: Married    Spouse name: N/A  . Number of children: N/A  . Years of education: N/A   Occupational History  . Not on file.   Social History Main Topics  . Smoking  status: Former Smoker    Packs/day: 0.25    Years: 10.00    Quit date: 02/24/1985  . Smokeless tobacco: Never Used  . Alcohol use No  . Drug use: No  . Sexual activity: Not on file   Other Topics Concern  . Not on file   Social History Narrative  . No narrative on file    Review of Systems:  All other review of systems negative except as mentioned in the HPI.  Physical Exam: Vital signs in last 24 hours: Temp:  [98 F (36.7 C)-98.7 F (37.1 C)] 98.1 F (36.7 C) (07/30  1236) Pulse Rate:  [84-104] 98 (07/30 1236) Resp:  [11-20] 11 (07/30 1236) BP: (122-137)/(63-75) 133/63 (07/30 1236) SpO2:  [88 %-100 %] 95 % (07/30 1237) Weight:  [119 lb (54 kg)-124 lb 5.8 oz (56.4 kg)] 119 lb (54 kg) (07/30 1236) Last BM Date: 09/21/16 General:   Alert,  Well-developed, well-nourished, pleasant and cooperative in NAD Lungs:  Clear throughout to auscultation.   Heart:  Regular rate and rhythm; no murmurs, clicks, rubs,  or gallops. Abdomen:  Soft, nontender and nondistended. Normal bowel sounds.   Neuro/Psych:  Alert and cooperative. Normal mood and affect. A and O x 3   @K .Denzil Magnuson, MD [phone] Mon-Fri 8a-5p [phone] after 5p, weekends, holidays 09/22/2016 1:05 PM@

## 2016-09-23 ENCOUNTER — Encounter (HOSPITAL_COMMUNITY): Payer: Self-pay | Admitting: Gastroenterology

## 2016-09-23 ENCOUNTER — Other Ambulatory Visit: Payer: Self-pay | Admitting: Hematology & Oncology

## 2016-09-23 ENCOUNTER — Other Ambulatory Visit: Payer: Self-pay

## 2016-09-23 DIAGNOSIS — C7951 Secondary malignant neoplasm of bone: Principal | ICD-10-CM

## 2016-09-23 DIAGNOSIS — C649 Malignant neoplasm of unspecified kidney, except renal pelvis: Secondary | ICD-10-CM

## 2016-09-23 MED ORDER — CYCLOBENZAPRINE HCL 5 MG PO TABS: 5.0000 mg | ORAL_TABLET | Freq: Three times a day (TID) | ORAL | 0 refills | Status: AC | PRN

## 2016-09-24 ENCOUNTER — Telehealth: Payer: Self-pay

## 2016-09-24 NOTE — Telephone Encounter (Signed)
Faxed [employer] requisition to [facility] pathology. dph

## 2016-09-25 ENCOUNTER — Ambulatory Visit (HOSPITAL_BASED_OUTPATIENT_CLINIC_OR_DEPARTMENT_OTHER)
Admission: RE | Admit: 2016-09-25 | Discharge: 2016-09-25 | Disposition: A | Discharge status: Home / Self Care | Payer: Medicare PPO | Source: Ambulatory Visit | Attending: Family | Admitting: Family

## 2016-09-25 ENCOUNTER — Ambulatory Visit (HOSPITAL_BASED_OUTPATIENT_CLINIC_OR_DEPARTMENT_OTHER): Payer: Medicare PPO

## 2016-09-25 ENCOUNTER — Ambulatory Visit (HOSPITAL_BASED_OUTPATIENT_CLINIC_OR_DEPARTMENT_OTHER): Payer: Medicare PPO | Admitting: Family

## 2016-09-25 ENCOUNTER — Emergency Department (HOSPITAL_BASED_OUTPATIENT_CLINIC_OR_DEPARTMENT_OTHER): Payer: Medicare PPO

## 2016-09-25 ENCOUNTER — Telehealth: Payer: Self-pay | Admitting: *Deleted

## 2016-09-25 ENCOUNTER — Emergency Department (HOSPITAL_BASED_OUTPATIENT_CLINIC_OR_DEPARTMENT_OTHER)
Admission: EM | Admit: 2016-09-25 | Discharge: 2016-09-25 | Disposition: A | Discharge status: Home / Self Care | Payer: Medicare PPO | Source: Home / Self Care | Attending: Emergency Medicine | Admitting: Emergency Medicine

## 2016-09-25 ENCOUNTER — Encounter (HOSPITAL_BASED_OUTPATIENT_CLINIC_OR_DEPARTMENT_OTHER): Payer: Self-pay

## 2016-09-25 ENCOUNTER — Other Ambulatory Visit (HOSPITAL_BASED_OUTPATIENT_CLINIC_OR_DEPARTMENT_OTHER): Payer: Medicare PPO

## 2016-09-25 VITALS — BP 128/68 | HR 104 | Temp 97.5°F | Resp 20 | Wt 118.0 lb

## 2016-09-25 DIAGNOSIS — C7951 Secondary malignant neoplasm of bone: Secondary | ICD-10-CM

## 2016-09-25 DIAGNOSIS — R935 Abnormal findings on diagnostic imaging of other abdominal regions, including retroperitoneum: Secondary | ICD-10-CM | POA: Insufficient documentation

## 2016-09-25 DIAGNOSIS — R1084 Generalized abdominal pain: Secondary | ICD-10-CM

## 2016-09-25 DIAGNOSIS — Z79899 Other long term (current) drug therapy: Secondary | ICD-10-CM

## 2016-09-25 DIAGNOSIS — J45909 Unspecified asthma, uncomplicated: Secondary | ICD-10-CM | POA: Insufficient documentation

## 2016-09-25 DIAGNOSIS — K5902 Outlet dysfunction constipation: Secondary | ICD-10-CM

## 2016-09-25 DIAGNOSIS — K56609 Unspecified intestinal obstruction, unspecified as to partial versus complete obstruction: Secondary | ICD-10-CM

## 2016-09-25 DIAGNOSIS — M4185 Other forms of scoliosis, thoracolumbar region: Secondary | ICD-10-CM | POA: Insufficient documentation

## 2016-09-25 DIAGNOSIS — C419 Malignant neoplasm of bone and articular cartilage, unspecified: Secondary | ICD-10-CM

## 2016-09-25 DIAGNOSIS — X58XXXD Exposure to other specified factors, subsequent encounter: Secondary | ICD-10-CM | POA: Insufficient documentation

## 2016-09-25 DIAGNOSIS — C649 Malignant neoplasm of unspecified kidney, except renal pelvis: Secondary | ICD-10-CM

## 2016-09-25 DIAGNOSIS — M4854XG Collapsed vertebra, not elsewhere classified, thoracic region, subsequent encounter for fracture with delayed healing: Secondary | ICD-10-CM

## 2016-09-25 DIAGNOSIS — K580 Irritable bowel syndrome with diarrhea: Secondary | ICD-10-CM

## 2016-09-25 DIAGNOSIS — Z87891 Personal history of nicotine dependence: Secondary | ICD-10-CM | POA: Insufficient documentation

## 2016-09-25 DIAGNOSIS — I10 Essential (primary) hypertension: Secondary | ICD-10-CM | POA: Insufficient documentation

## 2016-09-25 DIAGNOSIS — G893 Neoplasm related pain (acute) (chronic): Secondary | ICD-10-CM | POA: Diagnosis not present

## 2016-09-25 LAB — LACTATE DEHYDROGENASE: LDH: 263 U/L — AB (ref 125–245)

## 2016-09-25 LAB — CBC WITH DIFFERENTIAL (CANCER CENTER ONLY)
BASO#: 0 10*3/uL (ref 0.0–0.2)
BASO%: 0.5 % (ref 0.0–2.0)
EOS ABS: 0.1 10*3/uL (ref 0.0–0.5)
EOS%: 2.7 % (ref 0.0–7.0)
HCT: 38.5 % (ref 34.8–46.6)
HGB: 12.8 g/dL (ref 11.6–15.9)
LYMPH#: 1.1 10*3/uL (ref 0.9–3.3)
LYMPH%: 28.5 % (ref 14.0–48.0)
MCH: 32.4 pg (ref 26.0–34.0)
MCHC: 33.2 g/dL (ref 32.0–36.0)
MCV: 98 fL (ref 81–101)
MONO#: 0.5 10*3/uL (ref 0.1–0.9)
MONO%: 14.1 % — AB (ref 0.0–13.0)
NEUT#: 2 10*3/uL (ref 1.5–6.5)
NEUT%: 54.2 % (ref 39.6–80.0)
PLATELETS: 207 10*3/uL (ref 145–400)
RBC: 3.95 10*6/uL (ref 3.70–5.32)
RDW: 18 % — AB (ref 11.1–15.7)
WBC: 3.8 10*3/uL — AB (ref 3.9–10.0)

## 2016-09-25 LAB — CMP (CANCER CENTER ONLY)
ALT(SGPT): 25 U/L (ref 10–47)
AST: 33 U/L (ref 11–38)
Albumin: 3.7 g/dL (ref 3.3–5.5)
Alkaline Phosphatase: 57 U/L (ref 26–84)
BUN: 4 mg/dL — AB (ref 7–22)
CHLORIDE: 103 meq/L (ref 98–108)
CO2: 25 meq/L (ref 18–33)
CREATININE: 1.1 mg/dL (ref 0.6–1.2)
Calcium: 9 mg/dL (ref 8.0–10.3)
GLUCOSE: 105 mg/dL (ref 73–118)
POTASSIUM: 4 meq/L (ref 3.3–4.7)
SODIUM: 134 meq/L (ref 128–145)
Total Bilirubin: 0.6 mg/dl (ref 0.20–1.60)
Total Protein: 6.7 g/dL (ref 6.4–8.1)

## 2016-09-25 MED ORDER — DIAZEPAM 5 MG/ML IJ SOLN: 2.5000 mg | Freq: Once | INTRAMUSCULAR | Status: DC

## 2016-09-25 MED ORDER — SODIUM CHLORIDE 0.9 % IV SOLN
Freq: Once | INTRAVENOUS | Status: AC
  Administered 2016-09-25: 11:00:00 via INTRAVENOUS

## 2016-09-25 MED ORDER — DIAZEPAM 5 MG PO TABS: 5.0000 mg | ORAL_TABLET | Freq: Once | ORAL | Status: DC

## 2016-09-25 MED ORDER — HYDROMORPHONE HCL 1 MG/ML IJ SOLN
INTRAMUSCULAR | Status: AC
  Filled 2016-09-25: qty 2

## 2016-09-25 MED ORDER — SODIUM CHLORIDE 0.9 % IV SOLN: 1000.0000 mL | INTRAVENOUS | Status: DC

## 2016-09-25 MED ORDER — HYDROMORPHONE HCL 2 MG PO TABS: 2.0000 mg | ORAL_TABLET | ORAL | 0 refills | Status: DC | PRN

## 2016-09-25 MED ORDER — HYDROMORPHONE HCL 1 MG/ML IJ SOLN
1.0000 mg | Freq: Once | INTRAMUSCULAR | Status: AC
  Administered 2016-09-25: 1 mg via INTRAVENOUS
  Filled 2016-09-25: qty 1

## 2016-09-25 MED ORDER — DIAZEPAM 5 MG/ML IJ SOLN
INTRAMUSCULAR | Status: AC
  Filled 2016-09-25: qty 2

## 2016-09-25 MED ORDER — HYDROCODONE-ACETAMINOPHEN 5-325 MG PO TABS
1.0000 | ORAL_TABLET | ORAL | Status: DC | PRN
  Administered 2016-09-25: 1 via ORAL
  Filled 2016-09-25: qty 1

## 2016-09-25 MED ORDER — FAMOTIDINE IN NACL 20-0.9 MG/50ML-% IV SOLN
INTRAVENOUS | Status: AC
  Filled 2016-09-25: qty 100

## 2016-09-25 MED ORDER — HYDROMORPHONE HCL 1 MG/ML IJ SOLN
2.0000 mg | Freq: Once | INTRAMUSCULAR | Status: AC
Start: 2016-09-25 — End: 2016-09-25
  Administered 2016-09-25: 2 mg via INTRAVENOUS

## 2016-09-25 MED ORDER — FAMOTIDINE IN NACL 20-0.9 MG/50ML-% IV SOLN
40.0000 mg | Freq: Once | INTRAVENOUS | Status: AC
  Administered 2016-09-25: 40 mg via INTRAVENOUS

## 2016-09-25 MED ORDER — IOPAMIDOL (ISOVUE-300) INJECTION 61%
80.0000 mL | Freq: Once | INTRAVENOUS | Status: AC | PRN
  Administered 2016-09-25: 100 mL via INTRAVENOUS

## 2016-09-25 MED ORDER — SODIUM CHLORIDE 0.9 % IV BOLUS (SEPSIS)
500.0000 mL | Freq: Once | INTRAVENOUS | Status: AC
  Administered 2016-09-25: 500 mL via INTRAVENOUS

## 2016-09-25 NOTE — ED Notes (Signed)
Patient transported to CT 

## 2016-09-25 NOTE — Progress Notes (Signed)
1250 To Radiology via [facility] for abd xray.

## 2016-09-25 NOTE — ED Triage Notes (Addendum)
C/o abd pain x 2.5 weeks-was seen by oncology today and brought to ED-report from RN that pt has "obstruction"-was given dilaudid 2mg  and pepcid in oncology-IV-saline lock in place to right wrist-pt presented in w/c-taken to tx ares-was able to stand/step to stretcher

## 2016-09-25 NOTE — ED Provider Notes (Signed)
[facility] [place] DEPT [facility] Provider Note   CSN: [id number] Arrival date & time: 09/25/16  1352     History   Chief Complaint Chief Complaint  Patient presents with  . Abdominal Pain    HPI Cathy Lewis is a 71 y.o. female presenting with 3 weeks of abdominal pain.   Patient with past medical history is significant for metastatic renal cell carcinoma presenting with worsening abdominal pain. Patient was seen at oncology this morning, where an abdominal film was indicative of bowel obstruction. Patient was suggested to come to the emergency room for further evaluation and possible admission.  Patient very distressed and provided minimal response due to being in pain. States she's been having abdominal pain for several weeks. Pain is currently on the right side, but she states it switches. Last bowel movement was on Tuesday, and it was nonbloody and normal consistency. She reports she has not had an appetite, and has not had nothing to eat today. She's been taking hydrocodone with short-term relief. She was given pain medicine (Dilaudid) at the oncology clinic, which provided relief for a short period of time. She denies fever, chills, chest pain, shortness of breath, cough, or urinary symptoms. Per chart review, patient has had multiple abdominal surgeries including hysterectomy, appendectomy, lap cholecystectomy, and 2 inguinal hernia repairs.  HPI  Past Medical History:  Diagnosis Date  . Arthritis    arthritis in knees and back   . Asthma   . Atypical chest pain    NORMAL CARDIOLITE STUDY   . Complication of anesthesia    hard to wake up from umb hernia-was given alot of meds  . Depression   . Dyslipidemia   . GERD (gastroesophageal reflux disease)   . Headache   . Hypertension   . 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 Seasonal allergies   . Syncope    HISTORY OF    Patient Active Problem List   Diagnosis Date Noted  . Chronic left-sided thoracic  back pain   . Abdominal pain 09/21/2016  . Anemia 09/21/2016  . Abdominal pain, epigastric   . Chronic constipation   . Presbyopia of both eyes 03/17/2016  . GERD (gastroesophageal reflux disease) 10/09/2015  . Cough variant asthma 05/02/2015  . 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 Malignant neoplasm metastatic to bone ([place]) 08/29/2014  . H/O therapeutic radiation 08/29/2014  . Carcinoma of kidney ([place]) 08/29/2014  . LBP (low back pain) 08/29/2014  . Atypical chest pain   . Dyslipidemia   . Hypertension   . Syncope     Past Surgical History:  Procedure Laterality Date  . ABDOMINAL HYSTERECTOMY  2004   rectocele  . APPENDECTOMY    . BUNIONECTOMY Left 01-11-2013   left foot  . BUNIONECTOMY Right 12-31-2006   with hammer toe  . CARPAL TUNNEL RELEASE     R  . CHOLECYSTECTOMY N/A 08/27/2015   Procedure: LAPAROSCOPIC CHOLECYSTECTOMY WITH INTRAOPERATIVE CHOLANGIOGRAM;  Surgeon: [name], MD;  Location: [facility];  Service: General;  Laterality: N/A;  . COLONOSCOPY    . CYSTOCELE REPAIR    . DILATION AND CURETTAGE OF UTERUS    . ESOPHAGOGASTRODUODENOSCOPY N/A 09/22/2016   Procedure: ESOPHAGOGASTRODUODENOSCOPY (EGD);  Surgeon: Mauri Pole, MD;  Location: [facility];  Service: Endoscopy;  Laterality: N/A;  . HERNIA REPAIR  2014    umb, right and left  . INGUINAL HERNIA REPAIR Left 01/03/2014   Procedure: LEFT INGUINAL HERNIA REPAIR ;  Surgeon: [name], MD;  Location: [facility];  Service: General;  Laterality: Left;  . INGUINAL HERNIA REPAIR Right 04/06/2015   Procedure: HERNIA REPAIR RIGHT  INGUINAL ADULT WITH MESH;  Surgeon: [name], MD;  Location: [facility];  Service: General;  Laterality: Right;  . INSERTION OF MESH Left 01/03/2014   Procedure: INSERTION OF MESH;  Surgeon: [name], MD;  Location: [facility];  Service: General;  Laterality: Left;  . INSERTION OF MESH Right 04/06/2015   Procedure:  INSERTION OF MESH;  Surgeon: [name], MD;  Location: [facility];  Service: General;  Laterality: Right;  . IR GENERIC HISTORICAL  07/03/2015   IR RADIOLOGIST EVAL & MGMT 07/03/2015 Marybelle Killings, MD [facility]  . IR GENERIC HISTORICAL  05/24/2015   IR RADIOLOGIST EVAL & MGMT 05/24/2015 Marybelle Killings, MD [facility]  . JOINT REPLACEMENT    . 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 OTHER SURGICAL HISTORY     bladder vault prolapse  . OTHER SURGICAL HISTORY     right foot bunion and hammer toe surgery   . OTHER SURGICAL HISTORY     right knee arthroscopic   . OTHER SURGICAL HISTORY     right arm ( underarm ) surgery removed ? cyst   . TOTAL KNEE ARTHROPLASTY  03/11/2011   Procedure: TOTAL KNEE ARTHROPLASTY;  Surgeon: Cynda Familia, MD;  Location: [facility];  Service: Orthopedics;  Laterality: Right;  . TUBAL LIGATION     1977    OB History    No data available       Home Medications    Prior to Admission medications   Medication Sig Start Date End Date Taking? Authorizing Provider  albuterol (PROVENTIL,VENTOLIN) 90 MCG/ACT inhaler Inhale 2 puffs into the lungs every 4 (four) hours as needed for wheezing.     [provider]  amLODipine (NORVASC) 5 MG tablet Take 5 mg by mouth daily.  07/28/15   [provider]  CABOMETYX 40 MG TABS TAKE 1 TABLET BY MOUTH DAILY Patient taking differently: TAKE 1 TABLET BY MOUTH EVERY OTHER DAY 03/31/16   Volanda Napoleon, MD  Calcium Carbonate-Vitamin D (CALCIUM + D PO) Take 1 tablet by mouth daily.     [provider]  cephALEXin (KEFLEX) 500 MG capsule Take 500 mg by mouth 3 (three) times daily. 09/15/16   [provider]  cyclobenzaprine (FLEXERIL) 5 MG tablet Take 1 tablet (5 mg total) by mouth 3 (three) times daily as needed for muscle spasms. 09/23/16   Volanda Napoleon, MD  denosumab (XGEVA) 120 MG/1.7ML SOLN injection Inject 120 mg into the skin every 30 (thirty) days.    [provider]   dicyclomine (BENTYL) 20 MG tablet Take 20 mg by mouth 3 (three) times daily as needed (Stomach Cramps).    [provider]  fentaNYL (DURAGESIC - DOSED MCG/HR) 12 MCG/HR Place 12.5 mcg onto the skin every 3 (three) days.    [provider]  fluticasone (FLONASE) 50 MCG/ACT nasal spray Place 1 spray into both nostrils daily.    [provider]  Fluticasone-Salmeterol (ADVAIR DISKUS) 100-50 MCG/DOSE AEPB Inhale 1 puff into the lungs 2 (two) times daily before a meal.    [provider]  HYDROcodone-acetaminophen (NORCO) 5-325 MG tablet Take 1 tablet by mouth every 6 (six) hours as needed. 03/01/16   Orlie Dakin, MD  HYDROmorphone (DILAUDID) 2 MG tablet Take 1 tablet (2  mg total) by mouth every 4 (four) hours as needed for severe pain. 09/25/16   McDonald, Mia A, PA-C  Magnesium 250 MG TABS Take 250 mg by mouth daily.    [provider]  metoCLOPramide (REGLAN) 10 MG tablet Take 1 tablet (10 mg total) by mouth every 8 (eight) hours as needed for nausea or vomiting. 09/13/16   Duffy Bruce, MD  Misc Natural Products (OSTEO BI-FLEX ADV DOUBLE ST PO) Take 1 tablet by mouth daily.    [provider]  montelukast (SINGULAIR) 10 MG tablet Take 10 mg by mouth at bedtime.     [provider]  nystatin (MYCOSTATIN) 100000 UNIT/ML suspension Take 5 mLs (500,000 Units total) by mouth 4 (four) times daily. Patient not taking: Reported on 09/21/2016 09/25/15   [place], Holli Humbles, NP  pantoprazole (PROTONIX) 40 MG tablet Take 1 tablet (40 mg total) by mouth 2 (two) times daily before a meal. 09/22/16   Gherg[place], Vella Redhead, MD  pilocarpine (SALAGEN) 5 MG tablet Take 1 tablet (5 mg total) by mouth 2 (two) times daily. Patient not taking: Reported on 09/21/2016 10/02/15   [place], Holli Humbles, NP  predniSONE (DELTASONE) 5 MG tablet Take 1 tablet (5 mg total) by mouth daily with breakfast. 03/31/16   [place], Holli Humbles, NP  Probiotic Product (PROBIOTIC DAILY PO)  Take 1 tablet by mouth daily.     [provider]  promethazine (PHENERGAN) 12.5 MG tablet Take 1 tablet (12.5 mg total) by mouth every 6 (six) hours as needed for nausea or vomiting. 09/03/16   Volanda Napoleon, MD  ranitidine (ZANTAC) 150 MG tablet Take 1 tablet (150 mg total) by mouth 2 (two) times daily. 09/13/16 09/23/16  Duffy Bruce, MD  sucralfate (CARAFATE) 1 GM/10ML suspension Take 10 mLs (1 g total) by mouth 4 (four) times daily -  with meals and at bedtime. 09/13/16   Duffy Bruce, MD  vitamin C (ASCORBIC ACID) 500 MG tablet Take 500 mg by mouth daily.    [provider]    Family History Family History  Problem Relation Age of Onset  . Heart attack Father        DECEASED  . Cancer Mother        DECEASED-unsure of type  . Cancer Brother        LUNG CA- smoked  . Aneurysm Brother        AAA  . Asthma Sister     Social History Social History  Substance Use Topics  . Smoking status: Former Smoker    Packs/day: 0.25    Years: 10.00    Quit date: 02/24/1985  . Smokeless tobacco: Never Used  . Alcohol use No     Allergies   Azithromycin; Ciprofloxacin; Etodolac; Gnp [acetaminophen]; Moxifloxacin; Tramadol; and Rofecoxib   Review of Systems Review of Systems  Gastrointestinal: Positive for abdominal pain.       Decreased BMs  All other systems reviewed and are negative.    Physical Exam Updated Vital Signs BP 132/70 (BP Location: Left Arm)   Pulse 88   Temp 97.7 F (36.5 C) (Oral)   Resp 16   SpO2 98%   Physical Exam  Constitutional: She is oriented to person, place, and time. She appears well-developed and well-nourished.  Patient appears uncomfortable due to pain  HENT:  Head: Normocephalic and atraumatic.  Eyes: Pupils are equal, round, and reactive to light.  Neck: Normal range of motion. Neck supple.  Cardiovascular: Regular rhythm and  intact distal pulses.  Tachycardia present.   Pulmonary/Chest: Effort normal and breath  sounds normal. No respiratory distress. She has no wheezes.  Abdominal: She exhibits no distension. Bowel sounds are decreased. There is tenderness in the right upper quadrant. There is guarding.  Bowel sounds hypoactive. Generalized abdominal pain, with worsening pain in the right upper quadrant.  Musculoskeletal: Normal range of motion.  Lymphadenopathy:    She has no cervical adenopathy.  Neurological: She is alert and oriented to person, place, and time.  Skin: Skin is warm and dry. She is not diaphoretic.  Psychiatric: She has a normal mood and affect.  Nursing note and vitals reviewed.    ED Treatments / Results  Labs (all labs ordered are listed, but only abnormal results are displayed) Labs Reviewed - No data to display  EKG  EKG Interpretation None       Radiology Ct Abdomen Pelvis W Contrast  Result Date: 09/25/2016 CLINICAL DATA:  Abdominal pain for the past 2.5 weeks. Previous cholecystectomy, appendectomy and inguinal hernia repair. History of renal cell carcinoma metastatic to the bone diagnosed in 2016. EXAM: CT ABDOMEN AND PELVIS WITH CONTRAST TECHNIQUE: Multidetector CT imaging of the abdomen and pelvis was performed using the standard protocol following bolus administration of intravenous contrast. CONTRAST:  130mL ISOVUE-300 IOPAMIDOL (ISOVUE-300) INJECTION 61% COMPARISON:  Abdomen radiographs obtained earlier today. Abdomen and pelvis CT dated 09/19/2016 and chest CT dated 09/11/2016. FINDINGS: Lower chest: Multiple lung nodules are again demonstrated at the lung bases. These demonstrate a mild increase in size and number. The largest is at the right lateral lung base, measuring 1.4 x 1.3 cm in maximum diameter on image number 1 of series 4 today. This measured 1.1 x 0.9 cm on 09/19/2016 and 1.1 x 0.9 cm on 09/11/2016. Hepatobiliary: A 80 lobulated, heterogeneously enhancing mass in the lateral segment of the left lobe of the liver is similar to the previous  examinations, measuring 2.6 x 2.2 cm on coronal image number 40. A tiny probable cyst in the right lobe of the liver has not changed significantly. Cholecystectomy clips. Pancreas: Stable atrophy of the head, neck, body and proximal tail and mild atrophy of the distal tail. Spleen: Normal in size without focal abnormality. Adrenals/Urinary Tract: Little change in the heterogeneously enhancing mass in the mid right kidney. This measures 1.2 cm in AP diameter on image number 26 of series 2 and 1.6 x 1.3 cm on coronal image number 66. This previously measured 1.3 cm in maximum diameter. Multiple right renal cysts are also again demonstrated. Surgically absent left kidney. A heterogeneously enhancing left adrenal mass is again demonstrated, measuring 3.5 x 2.5 cm on image number 20 of series 2. This measured 3.0 x 2.0 cm on 09/11/2016. There is also a mildly heterogeneous right adrenal mass measuring 1.5 x 1.5 cm on image number 21 of series 2. This measured 1.7 x 1.1 cm on 09/11/2016. An additional 1.2 x 0.9 cm heterogeneously enhancing right adrenal mass on image number 18 of series 2 measured 1.0 x 0.8 cm on 09/11/2016. Unremarkable urinary bladder and right ureter. Stomach/Bowel: Increased caliber of the cecum, right colon and transverse colon, containing oral contrast without wall thickening, stricture or mass. Unremarkable stomach and small bowel. Vascular/Lymphatic: Atheromatous arterial calcifications without aneurysm. Mild increase in size and confluence of retroperitoneal lymph nodes. The largest is an aortocaval node with a short axis diameter of 9 mm on image number 29 of series 2. Reproductive: Status post hysterectomy. No adnexal masses.  Other: No abdominal wall hernia or abnormality. No abdominopelvic ascites. Musculoskeletal: Multiple lytic and sclerotic spine metastases without significant change. Stable sclerosis in collapse of the T12 vertebral body with kyphoplasty material. IMPRESSION: 1. No acute  abnormality. There has been increase in caliber of the cecum, right colon and transverse colon without evidence of obstruction. 2. Mildly progressive metastatic disease at the lung bases. 3. Stable probable metastasis in the lateral segment of the left lobe of the liver. 4. 1.6 cm mid right renal mass with a mild increase in size, suspicious for a primary right renal neoplasm or metastasis. 5. 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 6. Minimally progressive retroperitoneal adenopathy, most likely metastatic. 7. No significant change in extensive spinal metastatic disease. Electronically Signed   By: Claudie Revering M.D.   On: 09/25/2016 18:34   Dg Abd 2 Views  Result Date: 09/25/2016 CLINICAL DATA:  Abdominal pain with bloating. Known metastatic renal carcinoma EXAM: ABDOMEN - 2 VIEW COMPARISON:  CT abdomen and pelvis September 19, 2016 FINDINGS: Supine and upright images were obtained. There are loops of dilated bowel with multiple air-fluid levels. No free air appreciable. There are surgical clips in the right upper abdomen. There is thoracolumbar levoscoliosis, stable. There is evidence of fracture at T12 with metastatic disease in this area. IMPRESSION: Findings felt to be indicative of a degree of bowel obstruction. No free air. Fracture with bony metastasis at T12 again noted. There is thoracolumbar levoscoliosis. These results will be called to the ordering clinician or representative by the Radiologist Assistant, and communication documented in the PACS or zVision Dashboard. Electronically Signed   By: Lowella Grip III M.D.   On: 09/25/2016 13:16    Procedures Procedures (including critical care time)  Medications Ordered in ED Medications  sodium chloride 0.9 % bolus 500 mL (0 mLs Intravenous Stopped 09/25/16 1556)    Followed by  0.9 %  sodium chloride infusion (0 mLs Intravenous Stopped 09/25/16 2143)  HYDROcodone-acetaminophen (NORCO/VICODIN) 5-325 MG per tablet 1 tablet (1  tablet Oral Given 09/25/16 2140)  HYDROmorphone (DILAUDID) injection 1 mg (1 mg Intravenous Given 09/25/16 1532)  iopamidol (ISOVUE-300) 61 % injection 80 mL (100 mLs Intravenous Contrast Given 09/25/16 1748)  HYDROmorphone (DILAUDID) injection 1 mg (1 mg Intravenous Given 09/25/16 1910)     Initial Impression / Assessment and Plan / ED Course  I have reviewed the triage vital signs and the nursing notes.  Pertinent labs & imaging results that were available during my care of the patient were reviewed by me and considered in my medical decision making (see chart for details).     Pt presenting with 3 wks abd pain, with recent worsening of pain. During interview, pt appears very uncomfortable due to pain. Per chart reivew, abd xray showed signs concerning for bowel obstruction. Labs ordered at oncology visit today- will not repeat. Showed WBC of 3.8 (improved from last visits), and reassuring CMP. Will start IVF and help with pain control, and order CT for further evaluation. Discussed case with attending, and Dr. Wilson Singer evaluated the pt.  Prior to CT scan, pt signed out to [employer], PA-C.  Final Clinical Impressions(s) / ED Diagnoses   Final diagnoses:  Constipation due to outlet dysfunction    New Prescriptions Discharge Medication List as of 09/25/2016  9:53 PM    START taking these medications   Details  HYDROmorphone (DILAUDID) 2 MG tablet Take 1 tablet (2 mg total) by mouth every 4 (four) hours  as needed for severe pain., Starting Thu 09/25/2016, Print         Alvin, Acy Orsak, PA-C 09/25/16 2252    Virgel Manifold, MD 09/28/16 [phone]

## 2016-09-25 NOTE — ED Notes (Signed)
ED Provider at bedside discussing plan of care for dispo 

## 2016-09-25 NOTE — Telephone Encounter (Signed)
Received call from Patient stating that she is in extreme pain and dehydrated.  Patient obviously in distress.  Appt made for today to see Judson Roch , labs and infusion.

## 2016-09-25 NOTE — Progress Notes (Signed)
Hematology and Oncology Follow Up Visit  Cathy Lewis [id number] [birth year] 71 y.o. 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 She is already scheduled for an MRI this weekend. She is pale and crying holding her stomach. She states that the pain has worsened over the course of the last 3 weeks. She has not vomited and denies nausea. She is passing gas and belching. She states that it has "been a while since she had a bowel movement since she isn't really eating." We have moved her to the bedroom to make her more comfortable. She has an IV and fluids were started. We will give her IV pain medication and Pepcid as well.  Abdomen is slightly distended and tender throughout. She is slightly guarded on exam. Minimal bowel sounds. She is having  She has no appetite and has not been hydrating well.  She has had no fever, chills, cough, rash, chest pain, palpitations or changes in bladder habits.   No swelling, tenderness, numbness or tingling in her extremities.  She denies any episodes of bleeding. No bruising or petechiae.   ECOG Performance Status: 2 - Symptomatic, <50% confined to bed  Medications:  Allergies as of 09/25/2016      Reactions   Azithromycin Other (See Comments)   Stomach cramps.  Green black stool    Ciprofloxacin Other (See Comments)   Body aches Chills Heart races   Etodolac Palpitations, Other (See Comments)   Joints hurt   Gnp [acetaminophen] Other (See Comments)   "GRN" (cough syrup) Heart races   Moxifloxacin Palpitations, Other (See Comments)   Body ache   Tramadol Nausea And Vomiting, Other (See Comments)   Dizziness   Rofecoxib Other (See Comments)   Pt unsure       Medication List       Accurate as of 09/25/16 10:31 AM. Always use your most recent med list.          ADVAIR DISKUS 100-50 MCG/DOSE Aepb Generic drug:  Fluticasone-Salmeterol Inhale 1 puff into the lungs 2 (two) times daily before a meal.   albuterol 90 MCG/ACT inhaler Commonly known as:  PROVENTIL,VENTOLIN Inhale 2 puffs into the lungs every 4 (four) hours as needed for wheezing.   amLODipine 5 MG tablet Commonly known as:  NORVASC Take 5 mg by mouth daily.   CABOMETYX 40 MG Tabs Generic drug:  cabozantinib S-Malate TAKE 1 TABLET BY MOUTH DAILY   CALCIUM + D PO Take 1 tablet by mouth daily.   cephALEXin 500 MG capsule Commonly known as:  KEFLEX Take 500 mg by mouth 3 (three) times daily.   cyclobenzaprine 5 MG tablet Commonly known as:  FLEXERIL Take 1 tablet (5 mg total) by mouth 3 (three) times daily as needed for muscle spasms.   dicyclomine 20 MG tablet Commonly known as:  BENTYL Take 20 mg by mouth 3 (three) times daily as needed (Stomach Cramps).   fentaNYL 12 MCG/HR Commonly known as:  DURAGESIC - dosed mcg/hr  Place 12.5 mcg onto the skin every 3 (three) days.   fluticasone 50 MCG/ACT nasal spray Commonly known as:  FLONASE Place 1 spray into both nostrils daily.   HYDROcodone-acetaminophen 5-325 MG tablet Commonly known as:  NORCO Take 1 tablet by mouth every 6 (six) hours as needed.   Magnesium 250 MG Tabs Take 250 mg by mouth daily.   metoCLOPramide 10 MG tablet Commonly known as:  REGLAN Take 1 tablet (10 mg total) by mouth every 8 (eight) hours as needed for nausea or vomiting.   montelukast 10 MG tablet Commonly known as:  SINGULAIR Take 10 mg by mouth at bedtime.   nystatin 100000 UNIT/ML suspension Commonly known as:  MYCOSTATIN Take 5 mLs (500,000 Units  total) by mouth 4 (four) times daily.   OSTEO BI-FLEX ADV DOUBLE ST PO Take 1 tablet by mouth daily.   pantoprazole 40 MG tablet Commonly known as:  PROTONIX Take 1 tablet (40 mg total) by mouth 2 (two) times daily before a meal.   pilocarpine 5 MG tablet Commonly known as:  SALAGEN Take 1 tablet (5 mg total) by mouth 2 (two) times daily.   predniSONE 5 MG tablet Commonly known as:  DELTASONE Take 1 tablet (5 mg total) by mouth daily with breakfast.   PROBIOTIC DAILY PO Take 1 tablet by mouth daily.   promethazine 12.5 MG tablet Commonly known as:  PHENERGAN Take 1 tablet (12.5 mg total) by mouth every 6 (six) hours as needed for nausea or vomiting.   ranitidine 150 MG tablet Commonly known as:  ZANTAC Take 1 tablet (150 mg total) by mouth 2 (two) times daily.   sucralfate 1 GM/10ML suspension Commonly known as:  CARAFATE Take 10 mLs (1 g total) by mouth 4 (four) times daily -  with meals and at bedtime.   vitamin C 500 MG tablet Commonly known as:  ASCORBIC ACID Take 500 mg by mouth daily.   XGEVA 120 MG/1.7ML Soln injection Generic drug:  denosumab Inject 120 mg into the skin every 30 (thirty) days.       Allergies:  Allergies  Allergen Reactions  . Azithromycin Other (See Comments)    Stomach cramps.  Green black stool   . Ciprofloxacin Other (See Comments)    Body aches Chills Heart races  . Etodolac Palpitations and Other (See Comments)    Joints hurt   . Gnp [Acetaminophen] Other (See Comments)    "GRN" (cough syrup) Heart races  . Moxifloxacin Palpitations and Other (See Comments)    Body ache  . Tramadol Nausea And Vomiting and Other (See Comments)    Dizziness  . Rofecoxib Other (See Comments)    Pt unsure     Past Medical History, Surgical history, Social history, and Family History were reviewed and updated.  Review of Systems: All other 10 point review of systems is negative.   Physical Exam:  vitals were not taken for this  visit.  Wt Readings from Last 3 Encounters:  09/22/16 119 lb (54 kg)  09/19/16 119 lb (54 kg)  09/18/16 119 lb 9.6 oz (54.3 kg)    Ocular: Sclerae unicteric, pupils equal, round and reactive to light Ear-nose-throat: Oropharynx clear, dentition fair Lymphatic: No cervical, supraclavicular or axillary adenopathy Lungs no rales or rhonchi, good excursion bilaterally Heart regular rate and rhythm, no murmur appreciated Abd slightly distended, tender, minimal bowel sounds, no liver or spleen tip palpated, no fluid wave  MSK no focal spinal tenderness, no joint edema Neuro: non-focal,  well-oriented, appropriate affect Breasts: Deferred   Lab Results  Component Value Date   WBC 3.8 (L) 09/25/2016   HGB 12.8 09/25/2016   HCT 38.5 09/25/2016   MCV 98 09/25/2016   PLT 207 09/25/2016   No results found for: FERRITIN, IRON, TIBC, UIBC, IRONPCTSAT Lab Results  Component Value Date   RBC 3.95 09/25/2016   No results found for: KPAFRELGTCHN, LAMBDASER, KAPLAMBRATIO No results found for: IGGSERUM, IGA, IGMSERUM No results found for: Odetta Pink, SPEI   Chemistry      Component Value Date/Time   NA 139 09/22/2016 0431   NA 136 09/18/2016 1410   NA 137 03/04/2016 1043   K 4.0 09/22/2016 0431   K 4.8 (H) 09/18/2016 1410   K 4.1 03/04/2016 1043   CL 113 (H) 09/22/2016 0431   CL 104 09/18/2016 1410   CO2 17 (L) 09/22/2016 0431   CO2 25 09/18/2016 1410   CO2 22 03/04/2016 1043   BUN 7 09/22/2016 0431   BUN 10 09/18/2016 1410   BUN 14.6 03/04/2016 1043   CREATININE 1.05 (H) 09/22/2016 0431   CREATININE 1.4 (H) 09/18/2016 1410   CREATININE 1.3 (H) 03/04/2016 1043   GLU 104 07/20/2008      Component Value Date/Time   CALCIUM 7.2 (L) 09/22/2016 0431   CALCIUM 8.7 09/18/2016 1410   CALCIUM 9.4 03/04/2016 1043   ALKPHOS 42 09/22/2016 0431   ALKPHOS 59 09/18/2016 1410   ALKPHOS 77 03/04/2016 1043   AST 26 09/22/2016 0431   AST  34 09/18/2016 1410   AST 22 03/04/2016 1043   ALT 20 09/22/2016 0431   ALT 32 09/18/2016 1410   ALT 20 03/04/2016 1043   BILITOT 1.0 09/22/2016 0431   BILITOT 1.00 09/18/2016 1410   BILITOT 0.35 03/04/2016 1043      Impression and Plan: Cathy Lewis is a very pleasant 71 yo caucasian female with metastatic clear cell renal carcinoma diagnosed in 2013. She is here today with c.o of severe abdominal pain and no recent BM just flatus and belching.  Abdominal film was indicative of a degree of bowel obstruction so we will get her down to the ED and she will be admitted at [facility]. [name] will follow-up with her in the hospital.  She was given fluids, Pepcid and Dilaudid while she was here in the office and is feeling a bit better.  Report call to [facility] in ED.  Greater than 50% of her 25 minute face to face visit was spent counseling and coordinating care.   Eliezer Bottom, NP 8/2/201810:31 AM   ADDENDUM:  Unfortunately, I think we have discovered why Cathy Lewis is having this abdominal pain area did her plain films today clearly show that she has a bowel obstruction. I am not sure as to why she would have this. She has had past abdominal surgery. I know she has adhesions. I suppose this might be from her underlying malignancy.  Her abdomen is somewhat distended. Bowel sounds are somewhat decreased but high pitched.  Her labs look decent. She is not neutropenic. Her hemoglobin is 12.8 which I'm sure is somewhat falsely elevated because of dehydration. Her renal function is okay.  She will have to be admitted. I actually think that this might be one of those indications and which surgical exploration might help [place] out. I realize that she has metastatic renal cell cancer. However, she has done quite well with this. Her disease, for the  most part, has been fairly stable on oral therapy.  The recent EGD with stomach biopsy did show a likely metastatic focus. The pathologist cannot be certain  however. I am sending the tissue off for genetic array analysis.  I'm sure that she will need an NG tube. Again, I think surgical consultation and possible exploration might be indicated.  [name], MD

## 2016-09-25 NOTE — ED Notes (Signed)
ED Provider at bedside discussing dispo plan with pt and family.

## 2016-09-25 NOTE — ED Provider Notes (Signed)
Medical screening examination/treatment/procedure(s) were conducted as a shared visit with non-physician practitioner(s) and myself.  I personally evaluated the patient during the encounter.   EKG Interpretation None      71yF with abdominal pain. Fairly diffuse. On exam she seems mildly distended. Soft. Diffuse tenderness w/o peritonitis. Heart RRR. No murmur appreciated. Lungs clear.  Plain films suggestive of bowel obstruction. Will CT. Likely NGT and transfer to [facility].    Virgel Manifold, MD 09/25/16 [phone]

## 2016-09-25 NOTE — ED Notes (Signed)
Pt noted to have low SpO2. Pt repositioned and asked to take some deep breaths and SpO2 increased to 95%. Will continue to monitor.

## 2016-09-25 NOTE — ED Notes (Signed)
ED Provider at bedside. 

## 2016-09-25 NOTE — ED Notes (Signed)
Informed EDP that the patient and family are ready to talk to her

## 2016-09-25 NOTE — ED Notes (Signed)
Pt rang out to say she was ready to talk to the EDP again.

## 2016-09-25 NOTE — Patient Instructions (Signed)
Dehydration, Adult Dehydration is when there is not enough fluid or water in your body. This happens when you lose more fluids than you take in. Dehydration can range from mild to very bad. It should be treated right away to keep it from getting very bad. Symptoms of mild dehydration may include:  Thirst.  Dry lips.  Slightly dry mouth.  Dry, warm skin.  Dizziness. Symptoms of moderate dehydration may include:  Very dry mouth.  Muscle cramps.  Dark pee (urine). Pee may be the color of tea.  Your body making less pee.  Your eyes making fewer tears.  Heartbeat that is uneven or faster than normal (palpitations).  Headache.  Light-headedness, especially when you stand up from sitting.  Fainting (syncope). Symptoms of very bad dehydration may include:  Changes in skin, such as: ? Cold and clammy skin. ? Blotchy (mottled) or pale skin. ? Skin that does not quickly return to normal after being lightly pinched and let go (poor skin turgor).  Changes in body fluids, such as: ? Feeling very thirsty. ? Your eyes making fewer tears. ? Not sweating when body temperature is high, such as in hot weather. ? Your body making very little pee.  Changes in vital signs, such as: ? Weak pulse. ? Pulse that is more than 100 beats a minute when you are sitting still. ? Fast breathing. ? Low blood pressure.  Other changes, such as: ? Sunken eyes. ? Cold hands and feet. ? Confusion. ? Lack of energy (lethargy). ? Trouble waking up from sleep. ? Short-term weight loss. ? Unconsciousness. Follow these instructions at home:  If told by your doctor, drink an ORS: ? Make an ORS by using instructions on the package. ? Start by drinking small amounts, about  cup (120 mL) every 5-10 minutes. ? Slowly drink more until you have had the amount that your doctor said to have.  Drink enough clear fluid to keep your pee clear or pale yellow. If you were told to drink an ORS, finish the ORS  first, then start slowly drinking clear fluids. Drink fluids such as: ? Water. Do not drink only water by itself. Doing that can make the salt (sodium) level in your body get too low (hyponatremia). ? Ice chips. ? Fruit juice that you have added water to (diluted). ? Low-calorie sports drinks.  Avoid: ? Alcohol. ? Drinks that have a lot of sugar. These include high-calorie sports drinks, fruit juice that does not have water added, and soda. ? Caffeine. ? Foods that are greasy or have a lot of fat or sugar.  Take over-the-counter and prescription medicines only as told by your doctor.  Do not take salt tablets. Doing that can make the salt level in your body get too high (hypernatremia).  Eat foods that have minerals (electrolytes). Examples include bananas, oranges, potatoes, tomatoes, and spinach.  Keep all follow-up visits as told by your doctor. This is important. Contact a doctor if:  You have belly (abdominal) pain that: ? Gets worse. ? Stays in one area (localizes).  You have a rash.  You have a stiff neck.  You get angry or annoyed more easily than normal (irritability).  You are more sleepy than normal.  You have a harder time waking up than normal.  You feel: ? Weak. ? Dizzy. ? Very thirsty.  You have peed (urinated) only a small amount of very dark pee during 6-8 hours. Get help right away if:  You have symptoms of   very bad dehydration.  You cannot drink fluids without throwing up (vomiting).  Your symptoms get worse with treatment.  You have a fever.  You have a very bad headache.  You are throwing up or having watery poop (diarrhea) and it: ? Gets worse. ? Does not go away.  You have blood or something green (bile) in your throw-up.  You have blood in your poop (stool). This may cause poop to look black and tarry.  You have not peed in 6-8 hours.  You pass out (faint).  Your heart rate when you are sitting still is more than 100 beats a  minute.  You have trouble breathing. This information is not intended to replace advice given to you by your health care provider. Make sure you discuss any questions you have with your health care provider. Document Released: 12/07/2008 Document Revised: 08/31/2015 Document Reviewed: 04/06/2015 Elsevier Interactive Patient Education  2018 [employer]  

## 2016-09-25 NOTE — Discharge Instructions (Signed)
You may take 1 tablet every 4 hours as needed for pain control. Please do not take any of your Norco that you were previously prescribed while taking this medication. Please follow-up with your oncologist regarding pain control.   If you develop new or worsening symptoms, including pain in the were not able to control at home, please return to the emergency department for reevaluation.

## 2016-09-26 ENCOUNTER — Inpatient Hospital Stay (HOSPITAL_COMMUNITY)
Admission: EM | Admit: 2016-09-26 | Discharge: 2016-10-07 | DRG: 940 | Disposition: A | Discharge status: Hospice | Payer: Medicare PPO | Attending: Internal Medicine | Admitting: Internal Medicine

## 2016-09-26 ENCOUNTER — Telehealth: Payer: Self-pay

## 2016-09-26 ENCOUNTER — Ambulatory Visit: Payer: Medicare PPO | Admitting: Family

## 2016-09-26 ENCOUNTER — Encounter (HOSPITAL_COMMUNITY): Payer: Self-pay

## 2016-09-26 ENCOUNTER — Ambulatory Visit: Payer: Medicare PPO

## 2016-09-26 ENCOUNTER — Other Ambulatory Visit: Payer: Medicare PPO

## 2016-09-26 ENCOUNTER — Inpatient Hospital Stay (HOSPITAL_COMMUNITY): Payer: Medicare PPO

## 2016-09-26 DIAGNOSIS — J302 Other seasonal allergic rhinitis: Secondary | ICD-10-CM | POA: Diagnosis present

## 2016-09-26 DIAGNOSIS — K219 Gastro-esophageal reflux disease without esophagitis: Secondary | ICD-10-CM | POA: Diagnosis present

## 2016-09-26 DIAGNOSIS — K259 Gastric ulcer, unspecified as acute or chronic, without hemorrhage or perforation: Secondary | ICD-10-CM | POA: Diagnosis present

## 2016-09-26 DIAGNOSIS — R52 Pain, unspecified: Secondary | ICD-10-CM | POA: Diagnosis not present

## 2016-09-26 DIAGNOSIS — D649 Anemia, unspecified: Secondary | ICD-10-CM | POA: Diagnosis present

## 2016-09-26 DIAGNOSIS — Z7951 Long term (current) use of inhaled steroids: Secondary | ICD-10-CM

## 2016-09-26 DIAGNOSIS — T451X5A Adverse effect of antineoplastic and immunosuppressive drugs, initial encounter: Secondary | ICD-10-CM | POA: Diagnosis present

## 2016-09-26 DIAGNOSIS — K5900 Constipation, unspecified: Secondary | ICD-10-CM | POA: Diagnosis present

## 2016-09-26 DIAGNOSIS — C787 Secondary malignant neoplasm of liver and intrahepatic bile duct: Secondary | ICD-10-CM | POA: Diagnosis present

## 2016-09-26 DIAGNOSIS — Z515 Encounter for palliative care: Secondary | ICD-10-CM

## 2016-09-26 DIAGNOSIS — Z885 Allergy status to narcotic agent status: Secondary | ICD-10-CM

## 2016-09-26 DIAGNOSIS — K5903 Drug induced constipation: Secondary | ICD-10-CM | POA: Diagnosis not present

## 2016-09-26 DIAGNOSIS — R109 Unspecified abdominal pain: Secondary | ICD-10-CM | POA: Diagnosis not present

## 2016-09-26 DIAGNOSIS — R590 Localized enlarged lymph nodes: Secondary | ICD-10-CM | POA: Diagnosis present

## 2016-09-26 DIAGNOSIS — B965 Pseudomonas (aeruginosa) (mallei) (pseudomallei) as the cause of diseases classified elsewhere: Secondary | ICD-10-CM | POA: Diagnosis present

## 2016-09-26 DIAGNOSIS — K3189 Other diseases of stomach and duodenum: Secondary | ICD-10-CM | POA: Diagnosis not present

## 2016-09-26 DIAGNOSIS — Z87891 Personal history of nicotine dependence: Secondary | ICD-10-CM

## 2016-09-26 DIAGNOSIS — G893 Neoplasm related pain (acute) (chronic): Secondary | ICD-10-CM | POA: Diagnosis present

## 2016-09-26 DIAGNOSIS — Z881 Allergy status to other antibiotic agents status: Secondary | ICD-10-CM

## 2016-09-26 DIAGNOSIS — Z90710 Acquired absence of both cervix and uterus: Secondary | ICD-10-CM

## 2016-09-26 DIAGNOSIS — Z9049 Acquired absence of other specified parts of digestive tract: Secondary | ICD-10-CM | POA: Diagnosis not present

## 2016-09-26 DIAGNOSIS — D701 Agranulocytosis secondary to cancer chemotherapy: Secondary | ICD-10-CM

## 2016-09-26 DIAGNOSIS — N390 Urinary tract infection, site not specified: Secondary | ICD-10-CM | POA: Diagnosis present

## 2016-09-26 DIAGNOSIS — Z79899 Other long term (current) drug therapy: Secondary | ICD-10-CM

## 2016-09-26 DIAGNOSIS — R1013 Epigastric pain: Secondary | ICD-10-CM | POA: Diagnosis not present

## 2016-09-26 DIAGNOSIS — D899 Disorder involving the immune mechanism, unspecified: Secondary | ICD-10-CM | POA: Diagnosis present

## 2016-09-26 DIAGNOSIS — Z85528 Personal history of other malignant neoplasm of kidney: Secondary | ICD-10-CM | POA: Diagnosis not present

## 2016-09-26 DIAGNOSIS — E785 Hyperlipidemia, unspecified: Secondary | ICD-10-CM | POA: Diagnosis present

## 2016-09-26 DIAGNOSIS — C7971 Secondary malignant neoplasm of right adrenal gland: Secondary | ICD-10-CM | POA: Diagnosis present

## 2016-09-26 DIAGNOSIS — C7889 Secondary malignant neoplasm of other digestive organs: Secondary | ICD-10-CM | POA: Diagnosis present

## 2016-09-26 DIAGNOSIS — C799 Secondary malignant neoplasm of unspecified site: Secondary | ICD-10-CM | POA: Diagnosis not present

## 2016-09-26 DIAGNOSIS — Z9221 Personal history of antineoplastic chemotherapy: Secondary | ICD-10-CM

## 2016-09-26 DIAGNOSIS — Z9109 Other allergy status, other than to drugs and biological substances: Secondary | ICD-10-CM

## 2016-09-26 DIAGNOSIS — R1011 Right upper quadrant pain: Secondary | ICD-10-CM | POA: Diagnosis not present

## 2016-09-26 DIAGNOSIS — C641 Malignant neoplasm of right kidney, except renal pelvis: Secondary | ICD-10-CM | POA: Diagnosis not present

## 2016-09-26 DIAGNOSIS — Z905 Acquired absence of kidney: Secondary | ICD-10-CM

## 2016-09-26 DIAGNOSIS — Z8249 Family history of ischemic heart disease and other diseases of the circulatory system: Secondary | ICD-10-CM

## 2016-09-26 DIAGNOSIS — F329 Major depressive disorder, single episode, unspecified: Secondary | ICD-10-CM | POA: Diagnosis present

## 2016-09-26 DIAGNOSIS — Z923 Personal history of irradiation: Secondary | ICD-10-CM

## 2016-09-26 DIAGNOSIS — Z66 Do not resuscitate: Secondary | ICD-10-CM | POA: Diagnosis present

## 2016-09-26 DIAGNOSIS — C7951 Secondary malignant neoplasm of bone: Secondary | ICD-10-CM | POA: Diagnosis present

## 2016-09-26 DIAGNOSIS — Z96651 Presence of right artificial knee joint: Secondary | ICD-10-CM | POA: Diagnosis present

## 2016-09-26 DIAGNOSIS — T402X5A Adverse effect of other opioids, initial encounter: Secondary | ICD-10-CM | POA: Diagnosis not present

## 2016-09-26 DIAGNOSIS — K5909 Other constipation: Secondary | ICD-10-CM | POA: Diagnosis present

## 2016-09-26 DIAGNOSIS — C649 Malignant neoplasm of unspecified kidney, except renal pelvis: Secondary | ICD-10-CM | POA: Diagnosis present

## 2016-09-26 DIAGNOSIS — K253 Acute gastric ulcer without hemorrhage or perforation: Secondary | ICD-10-CM | POA: Diagnosis present

## 2016-09-26 DIAGNOSIS — E876 Hypokalemia: Secondary | ICD-10-CM | POA: Diagnosis present

## 2016-09-26 DIAGNOSIS — I10 Essential (primary) hypertension: Secondary | ICD-10-CM | POA: Diagnosis present

## 2016-09-26 DIAGNOSIS — R140 Abdominal distension (gaseous): Secondary | ICD-10-CM | POA: Diagnosis not present

## 2016-09-26 DIAGNOSIS — M545 Low back pain: Secondary | ICD-10-CM | POA: Diagnosis not present

## 2016-09-26 LAB — COMPREHENSIVE METABOLIC PANEL
ALBUMIN: 3.5 g/dL (ref 3.5–5.0)
ALK PHOS: 52 U/L (ref 38–126)
ALT: 22 U/L (ref 14–54)
AST: 29 U/L (ref 15–41)
Anion gap: 11 (ref 5–15)
BILIRUBIN TOTAL: 0.4 mg/dL (ref 0.3–1.2)
BUN: 7 mg/dL (ref 6–20)
CALCIUM: 8 mg/dL — AB (ref 8.9–10.3)
CO2: 20 mmol/L — ABNORMAL LOW (ref 22–32)
CREATININE: 0.98 mg/dL (ref 0.44–1.00)
Chloride: 105 mmol/L (ref 101–111)
GFR calc Af Amer: >60 mL/min (ref >60)
GFR, EST NON AFRICAN AMERICAN: 56 mL/min — AB (ref >60)
GLUCOSE: 91 mg/dL (ref 65–99)
Potassium: 3.6 mmol/L (ref 3.5–5.1)
Sodium: 136 mmol/L (ref 135–145)
TOTAL PROTEIN: 6.1 g/dL — AB (ref 6.5–8.1)

## 2016-09-26 LAB — CBC
HCT: 34.2 % — ABNORMAL LOW (ref 36.0–46.0)
Hemoglobin: 11.1 g/dL — ABNORMAL LOW (ref 12.0–15.0)
MCH: 31.4 pg (ref 26.0–34.0)
MCHC: 32.5 g/dL (ref 30.0–36.0)
MCV: 96.9 fL (ref 78.0–100.0)
PLATELETS: 186 10*3/uL (ref 150–400)
RBC: 3.53 MIL/uL — ABNORMAL LOW (ref 3.87–5.11)
RDW: 18.7 % — AB (ref 11.5–15.5)
WBC: 3.3 10*3/uL — AB (ref 4.0–10.5)

## 2016-09-26 LAB — URINALYSIS, ROUTINE W REFLEX MICROSCOPIC
BACTERIA UA: NONE SEEN
BILIRUBIN URINE: NEGATIVE
Glucose, UA: NEGATIVE mg/dL
Hgb urine dipstick: NEGATIVE
KETONES UR: 80 mg/dL — AB
NITRITE: NEGATIVE
PH: 5 (ref 5.0–8.0)
Protein, ur: NEGATIVE mg/dL
SPECIFIC GRAVITY, URINE: 1.026 (ref 1.005–1.030)

## 2016-09-26 LAB — LIPASE, BLOOD: Lipase: 23 U/L (ref 11–51)

## 2016-09-26 MED ORDER — ALBUTEROL SULFATE (2.5 MG/3ML) 0.083% IN NEBU: 3.0000 mL | INHALATION_SOLUTION | RESPIRATORY_TRACT | Status: DC | PRN

## 2016-09-26 MED ORDER — DEXAMETHASONE SODIUM PHOSPHATE 4 MG/ML IJ SOLN
8.0000 mg | INTRAMUSCULAR | Status: DC
  Administered 2016-09-26 – 2016-09-30 (×5): 8 mg via INTRAVENOUS
  Filled 2016-09-26 (×5): qty 2

## 2016-09-26 MED ORDER — HYDROMORPHONE 1 MG/ML IV SOLN
INTRAVENOUS | Status: DC
  Administered 2016-09-26: 19:00:00 via INTRAVENOUS
  Administered 2016-09-27: 0.5 mg via INTRAVENOUS
  Administered 2016-09-27 (×2): 1 mg via INTRAVENOUS
  Administered 2016-09-28 (×5): 0.5 mg via INTRAVENOUS
  Administered 2016-09-28: 2 mg via INTRAVENOUS
  Administered 2016-09-29: 1 mg via INTRAVENOUS
  Administered 2016-09-29: 1.5 mg via INTRAVENOUS
  Administered 2016-09-29 (×2): 1 mg via INTRAVENOUS
  Administered 2016-09-30: 2 mg via INTRAVENOUS
  Administered 2016-09-30: 1.5 mg via INTRAVENOUS
  Administered 2016-09-30: 16:00:00 via INTRAVENOUS
  Administered 2016-09-30 (×2): 1.5 mg via INTRAVENOUS
  Administered 2016-10-01: 2 mg via INTRAVENOUS
  Administered 2016-10-01: 1 mg via INTRAVENOUS
  Administered 2016-10-01: 0 mg via INTRAVENOUS
  Filled 2016-09-26 (×2): qty 25

## 2016-09-26 MED ORDER — METOCLOPRAMIDE HCL 10 MG PO TABS: 10.0000 mg | ORAL_TABLET | Freq: Three times a day (TID) | ORAL | Status: DC | PRN

## 2016-09-26 MED ORDER — MONTELUKAST SODIUM 10 MG PO TABS
10.0000 mg | ORAL_TABLET | Freq: Every day | ORAL | Status: DC
  Administered 2016-09-26 – 2016-09-30 (×5): 10 mg via ORAL
  Filled 2016-09-26 (×5): qty 1

## 2016-09-26 MED ORDER — MOMETASONE FURO-FORMOTEROL FUM 100-5 MCG/ACT IN AERO
2.0000 | INHALATION_SPRAY | Freq: Two times a day (BID) | RESPIRATORY_TRACT | Status: DC
  Administered 2016-09-26 – 2016-10-07 (×20): 2 via RESPIRATORY_TRACT
  Filled 2016-09-26: qty 8.8

## 2016-09-26 MED ORDER — DEXTROSE 5 % IV SOLN
1.0000 g | Freq: Two times a day (BID) | INTRAVENOUS | Status: AC
  Administered 2016-09-27 – 2016-10-01 (×9): 1 g via INTRAVENOUS
  Filled 2016-09-26 (×10): qty 1

## 2016-09-26 MED ORDER — SODIUM CHLORIDE 0.9% FLUSH: 9.0000 mL | INTRAVENOUS | Status: DC | PRN

## 2016-09-26 MED ORDER — DIPHENHYDRAMINE HCL 50 MG/ML IJ SOLN: 12.5000 mg | Freq: Four times a day (QID) | INTRAMUSCULAR | Status: DC | PRN

## 2016-09-26 MED ORDER — PANTOPRAZOLE SODIUM 40 MG IV SOLR
40.0000 mg | Freq: Two times a day (BID) | INTRAVENOUS | Status: DC
  Administered 2016-09-26 – 2016-09-27 (×3): 40 mg via INTRAVENOUS
  Filled 2016-09-26 (×4): qty 40

## 2016-09-26 MED ORDER — AMLODIPINE BESYLATE 5 MG PO TABS
5.0000 mg | ORAL_TABLET | Freq: Every day | ORAL | Status: DC
  Administered 2016-09-27 – 2016-10-02 (×5): 5 mg via ORAL
  Filled 2016-09-26 (×5): qty 1

## 2016-09-26 MED ORDER — DEXTROSE 5 % IV SOLN
1.0000 g | INTRAVENOUS | Status: AC
  Administered 2016-09-26: 1 g via INTRAVENOUS
  Filled 2016-09-26: qty 1

## 2016-09-26 MED ORDER — FLUTICASONE PROPIONATE 50 MCG/ACT NA SUSP
1.0000 | Freq: Every day | NASAL | Status: DC
  Administered 2016-09-29 – 2016-10-06 (×2): 1 via NASAL
  Filled 2016-09-26: qty 16

## 2016-09-26 MED ORDER — BISACODYL 10 MG RE SUPP: 10.0000 mg | Freq: Every day | RECTAL | Status: DC | PRN

## 2016-09-26 MED ORDER — HYDROMORPHONE HCL 1 MG/ML IJ SOLN
1.0000 mg | Freq: Once | INTRAMUSCULAR | Status: AC
  Administered 2016-09-26: 1 mg via INTRAVENOUS
  Filled 2016-09-26: qty 1

## 2016-09-26 MED ORDER — BISACODYL 10 MG RE SUPP
10.0000 mg | Freq: Once | RECTAL | Status: AC
  Administered 2016-09-26: 10 mg via RECTAL
  Filled 2016-09-26: qty 1

## 2016-09-26 MED ORDER — DEXTROSE 5 % IV SOLN: 1.0000 g | INTRAVENOUS | Status: DC

## 2016-09-26 MED ORDER — HYDROMORPHONE HCL 1 MG/ML IJ SOLN
0.5000 mg | INTRAMUSCULAR | Status: DC | PRN
  Administered 2016-09-26: 0.5 mg via INTRAVENOUS
  Filled 2016-09-26: qty 1

## 2016-09-26 MED ORDER — DIPHENHYDRAMINE HCL 12.5 MG/5ML PO ELIX: 12.5000 mg | ORAL_SOLUTION | Freq: Four times a day (QID) | ORAL | Status: DC | PRN

## 2016-09-26 MED ORDER — SUCRALFATE 1 GM/10ML PO SUSP
1.0000 g | Freq: Three times a day (TID) | ORAL | Status: DC
  Administered 2016-09-26 – 2016-10-06 (×37): 1 g via ORAL
  Filled 2016-09-26 (×37): qty 10

## 2016-09-26 MED ORDER — DICYCLOMINE HCL 20 MG PO TABS
20.0000 mg | ORAL_TABLET | Freq: Three times a day (TID) | ORAL | Status: DC | PRN
  Filled 2016-09-26: qty 1

## 2016-09-26 MED ORDER — CABOZANTINIB S-MALATE 40 MG PO TABS: 1.0000 | ORAL_TABLET | ORAL | Status: DC

## 2016-09-26 MED ORDER — DEXTROSE 5 % IV SOLN: 2.0000 g | Freq: Once | INTRAVENOUS | Status: DC

## 2016-09-26 MED ORDER — DEXTROSE-NACL 5-0.9 % IV SOLN
INTRAVENOUS | Status: DC
  Administered 2016-09-26 – 2016-10-04 (×10): via INTRAVENOUS
  Administered 2016-10-05: 1000 mL via INTRAVENOUS
  Administered 2016-10-05: 03:00:00 via INTRAVENOUS

## 2016-09-26 MED ORDER — NALOXONE HCL 0.4 MG/ML IJ SOLN: 0.4000 mg | INTRAMUSCULAR | Status: DC | PRN

## 2016-09-26 MED ORDER — ONDANSETRON HCL 4 MG/2ML IJ SOLN: 4.0000 mg | Freq: Four times a day (QID) | INTRAMUSCULAR | Status: DC | PRN

## 2016-09-26 MED ORDER — MAGNESIUM CITRATE PO SOLN
1.0000 | Freq: Once | ORAL | Status: AC
  Administered 2016-09-26: 1 via ORAL
  Filled 2016-09-26: qty 296

## 2016-09-26 NOTE — ED Triage Notes (Signed)
[name] mid abdominal pain x 3 weeks that has gotten progressively worse. Abdominal distention present. Patient denies any N/V/D. Patient states she last had a normal BM 3 days ago.

## 2016-09-26 NOTE — Telephone Encounter (Signed)
Received call from Cathy Lewis at [facility] pathology notifying Dr Marin Olp that they do not have enough tissue to spend sample for [facility]. dph

## 2016-09-26 NOTE — ED Provider Notes (Addendum)
[facility] DEPT Provider Note   CSN: [id number] Arrival date & time: 09/26/16  1005     History   Chief Complaint Chief Complaint  Patient presents with  . Abdominal Pain    HPI Cathy Lewis is a 71 y.o. female.  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 Pt presents with c/o abd pain, for about 3 weeks. Upper abdomen, generalized and radiates to the back. Pain is constant. Pt also reports no BM x 3 days, she is passing flatus. Pt was discharged from the hospital on 7/30, she came yday to the ER and was discharged. She continues to have severe pain. Pt on norco for pain. Pt saw Cancer team yday and has a MRI ordered for tomorrow, she doesn't think she can lay still.  Past Medical History:  Diagnosis Date  . Arthritis    arthritis in knees and back   . Asthma   . Atypical chest pain    NORMAL CARDIOLITE STUDY   . Complication of anesthesia    hard to wake up from umb hernia-was given alot of meds  . Depression   . Dyslipidemia   . GERD (gastroesophageal reflux disease)   . Headache   . Hypertension   . Metastatic renal cell carcinoma to bone ([place]) 11/20/2014  . Seasonal allergies   . Syncope    HISTORY OF    Patient Active Problem List   Diagnosis Date Noted  . Uncontrolled pain 09/26/2016  . Acute lower UTI 09/26/2016  . Leukopenia due to antineoplastic chemotherapy ([place]) 09/26/2016  . Chronic left-sided thoracic back pain   . Abdominal pain 09/21/2016  . Anemia 09/21/2016  . Abdominal pain, epigastric   . Chronic constipation   . Presbyopia of both eyes 03/17/2016  . GERD (gastroesophageal reflux disease) 10/09/2015  . Cough variant asthma 05/02/2015  . Metastatic renal cell carcinoma to bone ([place]) 11/20/2014  . Malignant neoplasm metastatic to bone ([place]) 08/29/2014  . H/O therapeutic radiation 08/29/2014  . Carcinoma of kidney ([place]) 08/29/2014  . LBP  (low back pain) 08/29/2014  . Atypical chest pain   . Dyslipidemia   . Hypertension   . Syncope     Past Surgical History:  Procedure Laterality Date  . ABDOMINAL HYSTERECTOMY  2004   rectocele  . APPENDECTOMY    . BUNIONECTOMY Left 01-11-2013   left foot  . BUNIONECTOMY Right 12-31-2006   with hammer toe  . CARPAL TUNNEL RELEASE     R  . CHOLECYSTECTOMY N/A 08/27/2015   Procedure: LAPAROSCOPIC CHOLECYSTECTOMY WITH INTRAOPERATIVE CHOLANGIOGRAM;  Surgeon: [name], MD;  Location: [facility];  Service: General;  Laterality: N/A;  . COLONOSCOPY    . CYSTOCELE REPAIR    . DILATION AND CURETTAGE OF UTERUS    . ESOPHAGOGASTRODUODENOSCOPY N/A 09/22/2016   Procedure: ESOPHAGOGASTRODUODENOSCOPY (EGD);  Surgeon: Mauri Pole, MD;  Location: [facility];  Service: Endoscopy;  Laterality: N/A;  . HERNIA REPAIR  2014    umb, right and left  . INGUINAL HERNIA REPAIR Left 01/03/2014   Procedure: LEFT INGUINAL HERNIA REPAIR ;  Surgeon: [name], MD;  Location: [facility];  Service: General;  Laterality: Left;  . INGUINAL HERNIA REPAIR Right 04/06/2015   Procedure: HERNIA REPAIR RIGHT  INGUINAL ADULT WITH MESH;  Surgeon: [name], MD;  Location: [facility];  Service: General;  Laterality: Right;  . INSERTION OF MESH Left  01/03/2014   Procedure: INSERTION OF MESH;  Surgeon: [name], MD;  Location: [facility];  Service: General;  Laterality: Left;  . INSERTION OF MESH Right 04/06/2015   Procedure: INSERTION OF MESH;  Surgeon: [name], MD;  Location: [facility];  Service: General;  Laterality: Right;  . IR GENERIC HISTORICAL  07/03/2015   IR RADIOLOGIST EVAL & MGMT 07/03/2015 Marybelle Killings, MD [facility]  . IR GENERIC HISTORICAL  05/24/2015   IR RADIOLOGIST EVAL & MGMT 05/24/2015 Marybelle Killings, MD [facility]  . JOINT REPLACEMENT    . 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 OTHER SURGICAL HISTORY     bladder vault prolapse  .  OTHER SURGICAL HISTORY     right foot bunion and hammer toe surgery   . OTHER SURGICAL HISTORY     right knee arthroscopic   . OTHER SURGICAL HISTORY     right arm ( underarm ) surgery removed ? cyst   . TOTAL KNEE ARTHROPLASTY  03/11/2011   Procedure: TOTAL KNEE ARTHROPLASTY;  Surgeon: Cynda Familia, MD;  Location: [facility];  Service: Orthopedics;  Laterality: Right;  . TUBAL LIGATION     1977    OB History    No data available       Home Medications    Prior to Admission medications   Medication Sig Start Date End Date Taking? Authorizing Provider  albuterol (PROVENTIL,VENTOLIN) 90 MCG/ACT inhaler Inhale 2 puffs into the lungs every 4 (four) hours as needed for wheezing.    Yes [provider]  amLODipine (NORVASC) 5 MG tablet Take 5 mg by mouth daily.  07/28/15  Yes [provider]  CABOMETYX 40 MG TABS TAKE 1 TABLET BY MOUTH DAILY Patient taking differently: TAKE 1 TABLET BY MOUTH EVERY OTHER DAY 03/31/16  Yes Ennever, Rudell Cobb, MD  Calcium Carbonate-Vitamin D (CALCIUM + D PO) Take 1 tablet by mouth daily.    Yes [provider]  cyclobenzaprine (FLEXERIL) 5 MG tablet Take 1 tablet (5 mg total) by mouth 3 (three) times daily as needed for muscle spasms. 09/23/16  Yes Ennever, Rudell Cobb, MD  denosumab (XGEVA) 120 MG/1.7ML SOLN injection Inject 120 mg into the skin every 30 (thirty) days.   Yes [provider]  dicyclomine (BENTYL) 20 MG tablet Take 20 mg by mouth 3 (three) times daily as needed (Stomach Cramps).   Yes [provider]  fentaNYL (DURAGESIC - DOSED MCG/HR) 12 MCG/HR Place 12.5 mcg onto the skin every 3 (three) days.   Yes [provider]  fluticasone (FLONASE) 50 MCG/ACT nasal spray Place 1 spray into both nostrils daily.   Yes [provider]  Fluticasone-Salmeterol (ADVAIR DISKUS) 100-50 MCG/DOSE AEPB Inhale 1 puff into the lungs 2 (two) times daily before a meal.   Yes [provider]   HYDROcodone-acetaminophen (NORCO) 5-325 MG tablet Take 1 tablet by mouth every 6 (six) hours as needed. 03/01/16  Yes Orlie Dakin, MD  HYDROmorphone (DILAUDID) 2 MG tablet Take 1 tablet (2 mg total) by mouth every 4 (four) hours as needed for severe pain. 09/25/16  Yes McDonald, Mia A, PA-C  Magnesium 250 MG TABS Take 250 mg by mouth daily.   Yes [provider]  metoCLOPramide (REGLAN) 10 MG tablet Take 1 tablet (10 mg total) by mouth every 8 (eight) hours as needed for nausea or vomiting. 09/13/16  Yes Duffy Bruce, MD  Misc Natural Products (OSTEO BI-FLEX ADV DOUBLE ST  PO) Take 1 tablet by mouth daily.   Yes [provider]  montelukast (SINGULAIR) 10 MG tablet Take 10 mg by mouth at bedtime.    Yes [provider]  pantoprazole (PROTONIX) 40 MG tablet Take 1 tablet (40 mg total) by mouth 2 (two) times daily before a meal. 09/22/16  Yes Gherghe, Vella Redhead, MD  predniSONE (DELTASONE) 5 MG tablet Take 1 tablet (5 mg total) by mouth daily with breakfast. 03/31/16  Yes [place], Holli Humbles, NP  Probiotic Product (PROBIOTIC DAILY PO) Take 1 tablet by mouth daily.    Yes [provider]  promethazine (PHENERGAN) 12.5 MG tablet Take 1 tablet (12.5 mg total) by mouth every 6 (six) hours as needed for nausea or vomiting. 09/03/16  Yes Ennever, Rudell Cobb, MD  sucralfate (CARAFATE) 1 GM/10ML suspension Take 10 mLs (1 g total) by mouth 4 (four) times daily -  with meals and at bedtime. 09/13/16  Yes Duffy Bruce, MD  vitamin C (ASCORBIC ACID) 500 MG tablet Take 500 mg by mouth daily.   Yes [provider]  nystatin (MYCOSTATIN) 100000 UNIT/ML suspension Take 5 mLs (500,000 Units total) by mouth 4 (four) times daily. Patient not taking: Reported on 09/21/2016 09/25/15   [place], Holli Humbles, NP  pilocarpine (SALAGEN) 5 MG tablet Take 1 tablet (5 mg total) by mouth 2 (two) times daily. Patient not taking: Reported on 09/21/2016 10/02/15   [place], Holli Humbles, NP  ranitidine  (ZANTAC) 150 MG tablet Take 1 tablet (150 mg total) by mouth 2 (two) times daily. Patient not taking: Reported on 09/26/2016 09/13/16 09/23/16  Duffy Bruce, MD    Family History Family History  Problem Relation Age of Onset  . Heart attack Father        DECEASED  . Cancer Mother        DECEASED-unsure of type  . Cancer Brother        LUNG CA- smoked  . Aneurysm Brother        AAA  . Asthma Sister     Social History Social History  Substance Use Topics  . Smoking status: Former Smoker    Packs/day: 0.25    Years: 10.00    Quit date: 02/24/1985  . Smokeless tobacco: Never Used  . Alcohol use No     Allergies   Azithromycin; Ciprofloxacin; Etodolac; Gnp [acetaminophen]; Moxifloxacin; Tramadol; and Rofecoxib   Review of Systems Review of Systems  Constitutional: Positive for activity change.  Gastrointestinal: Positive for abdominal pain. Negative for blood in stool and vomiting.  Allergic/Immunologic: Positive for immunocompromised state.  Hematological: Does not bruise/bleed easily.     Physical Exam Updated Vital Signs BP (!) 144/76 (BP Location: Right Arm)   Pulse 60   Temp 97.8 F (36.6 C) (Oral)   Resp 16   Ht 5\' 4"  (1.626 m)   Wt 53.5 kg (118 lb)   SpO2 96%   BMI 20.25 kg/m   Physical Exam  Constitutional: She is oriented to person, place, and time. She appears well-developed.  HENT:  Head: Normocephalic and atraumatic.  Eyes: EOM are normal.  Neck: Normal range of motion. Neck supple.  Cardiovascular: Normal rate.   Pulmonary/Chest: Effort normal.  Abdominal: Bowel sounds are normal.  Neurological: She is alert and oriented to person, place, and time.  Skin: Skin is warm and dry.  Nursing note and vitals reviewed.    ED Treatments / Results  Labs (all labs ordered are listed, but only abnormal results are  displayed) Labs Reviewed  COMPREHENSIVE METABOLIC PANEL - Abnormal; Notable for the following:       Result Value   CO2 20 (*)     Calcium 8.0 (*)    Total Protein 6.1 (*)    GFR calc non Af Amer 56 (*)    All other components within normal limits  CBC - Abnormal; Notable for the following:    WBC 3.3 (*)    RBC 3.53 (*)    Hemoglobin 11.1 (*)    HCT 34.2 (*)    RDW 18.7 (*)    All other components within normal limits  URINALYSIS, ROUTINE W REFLEX MICROSCOPIC - Abnormal; Notable for the following:    Ketones, ur 80 (*)    Leukocytes, UA LARGE (*)    Squamous Epithelial / LPF 0-5 (*)    All other components within normal limits  URINE CULTURE  LIPASE, BLOOD    EKG  EKG Interpretation None       Radiology Dg Abd 1 View  Result Date: 09/26/2016 CLINICAL DATA:  71 y/o F; metastatic renal cell carcinoma to the bone and recent EGD showing gastric adenocarcinoma. Patient complains of abdominal pain for about 3 weeks. EXAM: ABDOMEN - 1 VIEW COMPARISON:  09/25/2016 CT abdomen and pelvis. FINDINGS: Nonobstructive bowel gas pattern. There is retained contrast within the colon from prior CT of abdomen and pelvis. Multiple lucent lesions are present throughout the spine compatible with metastatic disease and there is a stable compression deformity of the T12 vertebral body post kyphoplasty with focal levocurvature. IMPRESSION: 1. Nonobstructive bowel gas pattern. Retained contrast within the colon from prior CT of abdomen and pelvis. 2. Multiple lytic lesions in the spine and stable compression deformity of T12 vertebral body post kyphoplasty with focal levocurvature. Electronically Signed   By: Kristine Garbe M.D.   On: 09/26/2016 16:22   Ct Abdomen Pelvis W Contrast  Result Date: 09/25/2016 CLINICAL DATA:  Abdominal pain for the past 2.5 weeks. Previous cholecystectomy, appendectomy and inguinal hernia repair. History of renal cell carcinoma metastatic to the bone diagnosed in 2016. EXAM: CT ABDOMEN AND PELVIS WITH CONTRAST TECHNIQUE: Multidetector CT imaging of the abdomen and pelvis was performed using the  standard protocol following bolus administration of intravenous contrast. CONTRAST:  163mL ISOVUE-300 IOPAMIDOL (ISOVUE-300) INJECTION 61% COMPARISON:  Abdomen radiographs obtained earlier today. Abdomen and pelvis CT dated 09/19/2016 and chest CT dated 09/11/2016. FINDINGS: Lower chest: Multiple lung nodules are again demonstrated at the lung bases. These demonstrate a mild increase in size and number. The largest is at the right lateral lung base, measuring 1.4 x 1.3 cm in maximum diameter on image number 1 of series 4 today. This measured 1.1 x 0.9 cm on 09/19/2016 and 1.1 x 0.9 cm on 09/11/2016. Hepatobiliary: A 80 lobulated, heterogeneously enhancing mass in the lateral segment of the left lobe of the liver is similar to the previous examinations, measuring 2.6 x 2.2 cm on coronal image number 40. A tiny probable cyst in the right lobe of the liver has not changed significantly. Cholecystectomy clips. Pancreas: Stable atrophy of the head, neck, body and proximal tail and mild atrophy of the distal tail. Spleen: Normal in size without focal abnormality. Adrenals/Urinary Tract: Little change in the heterogeneously enhancing mass in the mid right kidney. This measures 1.2 cm in AP diameter on image number 26 of series 2 and 1.6 x 1.3 cm on coronal image number 66. This previously measured 1.3 cm in maximum diameter. Multiple right  renal cysts are also again demonstrated. Surgically absent left kidney. A heterogeneously enhancing left adrenal mass is again demonstrated, measuring 3.5 x 2.5 cm on image number 20 of series 2. This measured 3.0 x 2.0 cm on 09/11/2016. There is also a mildly heterogeneous right adrenal mass measuring 1.5 x 1.5 cm on image number 21 of series 2. This measured 1.7 x 1.1 cm on 09/11/2016. An additional 1.2 x 0.9 cm heterogeneously enhancing right adrenal mass on image number 18 of series 2 measured 1.0 x 0.8 cm on 09/11/2016. Unremarkable urinary bladder and right ureter. Stomach/Bowel:  Increased caliber of the cecum, right colon and transverse colon, containing oral contrast without wall thickening, stricture or mass. Unremarkable stomach and small bowel. Vascular/Lymphatic: Atheromatous arterial calcifications without aneurysm. Mild increase in size and confluence of retroperitoneal lymph nodes. The largest is an aortocaval node with a short axis diameter of 9 mm on image number 29 of series 2. Reproductive: Status post hysterectomy. No adnexal masses. Other: No abdominal wall hernia or abnormality. No abdominopelvic ascites. Musculoskeletal: Multiple lytic and sclerotic spine metastases without significant change. Stable sclerosis in collapse of the T12 vertebral body with kyphoplasty material. IMPRESSION: 1. No acute abnormality. There has been increase in caliber of the cecum, right colon and transverse colon without evidence of obstruction. 2. Mildly progressive metastatic disease at the lung bases. 3. Stable probable metastasis in the lateral segment of the left lobe of the liver. 4. 1.6 cm mid right renal mass with a mild increase in size, suspicious for a primary right renal neoplasm or metastasis. 5. 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 6. Minimally progressive retroperitoneal adenopathy, most likely metastatic. 7. No significant change in extensive spinal metastatic disease. Electronically Signed   By: Claudie Revering M.D.   On: 09/25/2016 18:34   Dg Abd 2 Views  Result Date: 09/25/2016 CLINICAL DATA:  Abdominal pain with bloating. Known metastatic renal carcinoma EXAM: ABDOMEN - 2 VIEW COMPARISON:  CT abdomen and pelvis September 19, 2016 FINDINGS: Supine and upright images were obtained. There are loops of dilated bowel with multiple air-fluid levels. No free air appreciable. There are surgical clips in the right upper abdomen. There is thoracolumbar levoscoliosis, stable. There is evidence of fracture at T12 with metastatic disease in this area. IMPRESSION:  Findings felt to be indicative of a degree of bowel obstruction. No free air. Fracture with bony metastasis at T12 again noted. There is thoracolumbar levoscoliosis. These results will be called to the ordering clinician or representative by the Radiologist Assistant, and communication documented in the PACS or zVision Dashboard. Electronically Signed   By: Lowella Grip III M.D.   On: 09/25/2016 13:16    Procedures Procedures (including critical care time)  Medications Ordered in ED Medications  dexamethasone (DECADRON) injection 8 mg (8 mg Intravenous Given 09/26/16 1711)  HYDROmorphone (DILAUDID) injection 0.5 mg (0.5 mg Intravenous Given 09/26/16 1534)  bisacodyl (DULCOLAX) suppository 10 mg (0 mg Rectal Hold 09/26/16 1434)  magnesium citrate solution 1 Bottle (0 Bottles Oral Hold 09/26/16 1433)  naloxone (NARCAN) injection 0.4 mg (not administered)    And  sodium chloride flush (NS) 0.9 % injection 9 mL (not administered)  ondansetron (ZOFRAN) injection 4 mg (not administered)  diphenhydrAMINE (BENADRYL) injection 12.5 mg (not administered)    Or  diphenhydrAMINE (BENADRYL) 12.5 MG/5ML elixir 12.5 mg (not administered)  HYDROmorphone (DILAUDID) 1 mg/mL PCA injection (not administered)  pantoprazole (PROTONIX) injection 40 mg (not administered)  ceFEPIme (MAXIPIME) 1 g  in dextrose 5 % 50 mL IVPB (1 g Intravenous New Bag/Given 09/26/16 1736)  ceFEPIme (MAXIPIME) 1 g in dextrose 5 % 50 mL IVPB (not administered)  HYDROmorphone (DILAUDID) injection 1 mg (1 mg Intravenous Given 09/26/16 1205)     Initial Impression / Assessment and Plan / ED Course  I have reviewed the triage vital signs and the nursing notes.  Pertinent labs & imaging results that were available during my care of the patient were reviewed by me and considered in my medical decision making (see chart for details).  Clinical Course as of Sep 26 1756  Fri Sep 26, 2016  1216 CALLED MRI - they will not be able to accommodate  the MRI unless it is needed on emergent bases at this time.  [AN]  1230 Pt refused anything but open MRI. Dr. Alvy Bimler, Oncology will forward this info to [name] and his team. Palliative to see pt for pain control.  [AN]    Clinical Course User Index [AN] Varney Biles, MD    Pt comes in with 8-9/10 epigastric abd pain. Pain likely from the stomach ulcer which has been idenitified as cancerous. 5 visits in the last month, including 2 in 2 days, the last 2 are specifically due to pain control. Pt started on dilaudid yday 2 mg oral, and still in pain. Palliative consult placed. Anticipate admission.  Final Clinical Impressions(s) / ED Diagnoses   Final diagnoses:  Chronic pain due to neoplasm  Acute gastric ulcer without hemorrhage or perforation    New Prescriptions New Prescriptions   No medications on file     Varney Biles, MD 09/26/16 [place], [place], MD 09/26/16 1758

## 2016-09-26 NOTE — ED Notes (Signed)
ED Provider at bedside. 

## 2016-09-26 NOTE — Consult Note (Addendum)
71 yo with metastatic Renal Cell Cancer, newly diagnosed Gastric adenocarcinoma followed and treated by [name], she is on daily oral chemotherapy Cabometyx. She has been seen 5 times in th ED for uncontrolled pain and other symptoms in the setting of advanced cancer. She reports worsening symptoms for a month- severe pain in back, pain down her legs -difficulty getting comfortable in general. Not eating well. No BM in 4 days, feels full only passing bits of stool, but no vomiting. Was sent home on low dose oral dilaudid- has not had adequate pain relief.  Recommendations:  She needs to be admitted for Pain Control- she will require a PCA to safely and successfully determine what her pain requirements will be for opioid. She has diffuse spinal metastatic disease and multiple other contributors including severe constipation, possible some gastric outlet obstruction.  Severe Constipation: possible early obstruction or other serious process-CT yesterday ? Issues going on with cecum and parts of her bowel? May need to consider re-scan if she gets worse. For now I am going to use Dulcolax suppository and have her sip on Magnesium Citrate, she has been using Miralax at home but not getting results. IF this does not work and she isn't obstructed or having acute bowel issues we will use Relistor.  Spinal Pain: Will give her Decadron- will help with pain control. Can also add on Lyrica or other neuropathic pain agent at bedtime. Her fentanyl patch was due for change yesterday. Will place back on for basal.    We had a very preliminary discussion about her goals of care- the news about her gastric cancer has been shocking and she has not yet received any prognostic information and doesn't know what to expect. I am worried about her disease progression in general- but she looks fairly healthy- she is thin but her albumin is good despite her report about being unable to eat for over a week. Her family does not  know how serious her dx and cancer is currently. She wants to have her daughter and son named HCPOA.  She is from [place] and has a good support system at home- she has three biological children and two step children-all live in [place] and are close by. Her husband is very HOH and cannot help in decision making or have long discussion.   Will hopefully get her moved upstairs to 3W Oncology- Med-Surg bed ok.  I discussed code status with her and my recommendations for limitations. She is going to consider our discussion and will make a decision with her family. She tells me she has never thought about these types of things but wants me to come back once she is under better control with her pain.   Lane Hacker, DO Palliative Medicine [phone]

## 2016-09-26 NOTE — Telephone Encounter (Signed)
Patient phoned to say she went to [place] yesterday but did not stay.  She's calling back this am to say she is in terrible pain and would like to go back to be admitted.  Per [name], please return to [facility] ER to be seen, evaluated and possibly admitted.

## 2016-09-26 NOTE — Progress Notes (Signed)
Pharmacy Antibiotic Note  Cathy Lewis is a 71 y.o. 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 Patient was prescribed Cephalexin for UTI as outpatient (of note, urine culture from 09/13/16 grew Pseudomonas aeruginosa, which Cephalexin would not cover). Pharmacy has been consulted for Cefepime dosing for UTI.  Plan: Cefepime 1g IV q12h. Monitor renal function, cultures, clinical course.   Height: 5\' 4"  (162.6 cm) Weight: 118 lb (53.5 kg) IBW/kg (Calculated) : 54.7  Temp (24hrs), Avg:97.6 F (36.4 C), Min:97.6 F (36.4 C), Max:97.6 F (36.4 C)   Recent Labs Lab 09/21/16 1141 09/22/16 0431 09/25/16 0914 09/26/16 1121  WBC 3.3* 3.5* 3.8* 3.3*  CREATININE 0.94 1.05* 1.1 0.98    Estimated Creatinine Clearance: 43.2 mL/min (by C-G formula based on SCr of 0.98 mg/dL).    Allergies  Allergen Reactions  . Azithromycin Other (See Comments)    Stomach cramps.  Green black stool   . Ciprofloxacin Other (See Comments)    Body aches Chills Heart races  . Etodolac Palpitations and Other (See Comments)    Joints hurt   . Gnp [Acetaminophen] Other (See Comments)    "GRN" (cough syrup) Heart races  . Moxifloxacin Palpitations and Other (See Comments)    Body ache  . Tramadol Nausea And Vomiting and Other (See Comments)    Dizziness  . Rofecoxib Other (See Comments)    Pt unsure     Antimicrobials this admission: 8/3 >> Cefepime >>  Dose adjustments this admission: --  Microbiology results: 7/21 UCx: 80K Pseuodomonas aeruginosa (pan-sensitive)  8/3 UCx: ordered   Thank you for allowing pharmacy to be a part of this patient's care.   Lindell Spar, PharmD, BCPS Pager: [phone] 09/26/2016 5:11 PM

## 2016-09-26 NOTE — Progress Notes (Signed)
Responding to spiritual care consult re: advance directive.   Provided support with pt and daughter at bedside.  Explained HCPOA paperwork and process.  Pt wishes to complete paperwork at later time due to pain.  She understands how to contact chaplain for questions and notary.  Will follow up to complete paperwork.

## 2016-09-26 NOTE — H&P (Signed)
History and Physical    Cathy Lewis FWY:[id number] DOB: [birth year] DOA: 09/26/2016  PCP: Christain Sacramento, MD  Patient coming from: Home   I have personally briefly reviewed patient's old medical records in [place]  Chief Complaint: severe pain, back abdomen and poor oral intake.   HPI: Cathy Lewis is a 71 y.o. 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 Patient with frequents ED visit due to severe , uncontrolled pain. She report also poor oral intake. She is complaining of back pain, and abdominal pain. Denies vomiting, but she has not been able to eat. She has not had a BM in days. She has pass flatus. She is suppose to be taking antibiotics for UTI, antibiotics prescribe by PCP.  She denies dysuria, chest pain or dyspnea.   ED Course: Patient presents complaining of severe pain, leukopenia with WBC at 3.3, Hb at 11, K at 3.6, CO@ 20, UA with too numerous to count WBC. KUB non obstructive bowel pattern.   Review of Systems: As per HPI otherwise 10 point review of systems negative.    Past Medical History:  Diagnosis Date  . Arthritis    arthritis in knees and back   . Asthma   . Atypical chest pain    NORMAL CARDIOLITE STUDY   . Complication of anesthesia    hard to wake up from umb hernia-was given alot of meds  . Depression   . Dyslipidemia   . GERD (gastroesophageal reflux disease)   . Headache   . Hypertension   . Metastatic renal cell carcinoma to bone ([place]) 11/20/2014  . Seasonal allergies   . Syncope    HISTORY OF    Past Surgical History:  Procedure Laterality Date  . ABDOMINAL HYSTERECTOMY  2004   rectocele  . APPENDECTOMY    . BUNIONECTOMY Left 01-11-2013   left foot  . BUNIONECTOMY Right 12-31-2006   with hammer toe  . CARPAL TUNNEL RELEASE      R  . CHOLECYSTECTOMY N/A 08/27/2015   Procedure: LAPAROSCOPIC CHOLECYSTECTOMY WITH INTRAOPERATIVE CHOLANGIOGRAM;  Surgeon: [name], MD;  Location: [facility];  Service: General;  Laterality: N/A;  . COLONOSCOPY    . CYSTOCELE REPAIR    . DILATION AND CURETTAGE OF UTERUS    . ESOPHAGOGASTRODUODENOSCOPY N/A 09/22/2016   Procedure: ESOPHAGOGASTRODUODENOSCOPY (EGD);  Surgeon: Mauri Pole, MD;  Location: [facility];  Service: Endoscopy;  Laterality: N/A;  . HERNIA REPAIR  2014    umb, right and left  . INGUINAL HERNIA REPAIR Left 01/03/2014   Procedure: LEFT INGUINAL HERNIA REPAIR ;  Surgeon: [name], MD;  Location: [facility];  Service: General;  Laterality: Left;  . INGUINAL HERNIA REPAIR Right 04/06/2015   Procedure: HERNIA REPAIR RIGHT  INGUINAL ADULT WITH MESH;  Surgeon: [name], MD;  Location: [facility];  Service: General;  Laterality: Right;  . INSERTION OF MESH Left 01/03/2014   Procedure: INSERTION OF MESH;  Surgeon: [name], MD;  Location: [place] [facility];  Service: General;  Laterality: Left;  . INSERTION OF MESH Right 04/06/2015   Procedure: INSERTION OF MESH;  Surgeon: [name], MD;  Location: [facility];  Service: General;  Laterality: Right;  . IR GENERIC HISTORICAL  07/03/2015   IR RADIOLOGIST EVAL & MGMT 07/03/2015 Marybelle Killings, MD  [facility]  . IR GENERIC HISTORICAL  05/24/2015   IR RADIOLOGIST EVAL & MGMT 05/24/2015 Marybelle Killings, MD [facility]  . JOINT REPLACEMENT    . 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 OTHER SURGICAL HISTORY     bladder vault prolapse  . OTHER SURGICAL HISTORY     right foot bunion and hammer toe surgery   . OTHER SURGICAL HISTORY     right knee arthroscopic   . OTHER SURGICAL HISTORY     right arm ( underarm ) surgery removed ? cyst   . TOTAL KNEE ARTHROPLASTY  03/11/2011   Procedure: TOTAL KNEE ARTHROPLASTY;  Surgeon: Cynda Familia, MD;  Location: [facility];  Service:  Orthopedics;  Laterality: Right;  . TUBAL LIGATION     1977     reports that she quit smoking about 31 years ago. She has a 2.50 pack-year smoking history. She has never used smokeless tobacco. She reports that she does not drink alcohol or use drugs.  Allergies  Allergen Reactions  . Azithromycin Other (See Comments)    Stomach cramps.  Green black stool   . Ciprofloxacin Other (See Comments)    Body aches Chills Heart races  . Etodolac Palpitations and Other (See Comments)    Joints hurt   . Gnp [Acetaminophen] Other (See Comments)    "GRN" (cough syrup) Heart races  . Moxifloxacin Palpitations and Other (See Comments)    Body ache  . Tramadol Nausea And Vomiting and Other (See Comments)    Dizziness  . Rofecoxib Other (See Comments)    Pt unsure     Family History  Problem Relation Age of Onset  . Heart attack Father        DECEASED  . Cancer Mother        DECEASED-unsure of type  . Cancer Brother        LUNG CA- smoked  . Aneurysm Brother        AAA  . Asthma Sister    Unacceptable: Noncontributory, unremarkable, or negative. Acceptable: Family history reviewed and not pertinent (If you reviewed it)  Prior to Admission medications   Medication Sig Start Date End Date Taking? Authorizing Provider  albuterol (PROVENTIL,VENTOLIN) 90 MCG/ACT inhaler Inhale 2 puffs into the lungs every 4 (four) hours as needed for wheezing.    Yes [provider]  amLODipine (NORVASC) 5 MG tablet Take 5 mg by mouth daily.  07/28/15  Yes [provider]  CABOMETYX 40 MG TABS TAKE 1 TABLET BY MOUTH DAILY Patient taking differently: TAKE 1 TABLET BY MOUTH EVERY OTHER DAY 03/31/16  Yes Ennever, Rudell Cobb, MD  Calcium Carbonate-Vitamin D (CALCIUM + D PO) Take 1 tablet by mouth daily.    Yes [provider]  cyclobenzaprine (FLEXERIL) 5 MG tablet Take 1 tablet (5 mg total) by mouth 3 (three) times daily as needed for muscle spasms. 09/23/16  Yes Ennever, Rudell Cobb, MD    denosumab (XGEVA) 120 MG/1.7ML SOLN injection Inject 120 mg into the skin every 30 (thirty) days.   Yes [provider]  dicyclomine (BENTYL) 20 MG tablet Take 20 mg by mouth 3 (three) times daily as needed (Stomach Cramps).   Yes [provider]  fentaNYL (DURAGESIC - DOSED MCG/HR) 12 MCG/HR Place 12.5 mcg onto the skin every 3 (three) days.   Yes [provider]  fluticasone (FLONASE) 50 MCG/ACT nasal spray Place 1 spray into both nostrils daily.   Yes [provider]  Fluticasone-Salmeterol (ADVAIR DISKUS) 100-50 MCG/DOSE AEPB Inhale 1 puff into the lungs 2 (two) times daily before a meal.   Yes [provider]  HYDROcodone-acetaminophen (NORCO) 5-325 MG tablet Take 1 tablet by mouth every 6 (six) hours as needed. 03/01/16  Yes Orlie Dakin, MD  HYDROmorphone (DILAUDID) 2 MG tablet Take 1 tablet (2 mg total) by mouth every 4 (four) hours as needed for severe pain. 09/25/16  Yes McDonald, Mia A, PA-C  Magnesium 250 MG TABS Take 250 mg by mouth daily.   Yes [provider]  metoCLOPramide (REGLAN) 10 MG tablet Take 1 tablet (10 mg total) by mouth every 8 (eight) hours as needed for nausea or vomiting. 09/13/16  Yes Duffy Bruce, MD  Misc Natural Products (OSTEO BI-FLEX ADV DOUBLE ST PO) Take 1 tablet by mouth daily.   Yes [provider]  montelukast (SINGULAIR) 10 MG tablet Take 10 mg by mouth at bedtime.    Yes [provider]  pantoprazole (PROTONIX) 40 MG tablet Take 1 tablet (40 mg total) by mouth 2 (two) times daily before a meal. 09/22/16  Yes Gherghe, Vella Redhead, MD  predniSONE (DELTASONE) 5 MG tablet Take 1 tablet (5 mg total) by mouth daily with breakfast. 03/31/16  Yes [place], Holli Humbles, NP  Probiotic Product (PROBIOTIC DAILY PO) Take 1 tablet by mouth daily.    Yes [provider]  promethazine (PHENERGAN) 12.5 MG tablet Take 1 tablet (12.5 mg total) by mouth every 6 (six) hours as needed for nausea or  vomiting. 09/03/16  Yes Ennever, Rudell Cobb, MD  sucralfate (CARAFATE) 1 GM/10ML suspension Take 10 mLs (1 g total) by mouth 4 (four) times daily -  with meals and at bedtime. 09/13/16  Yes Duffy Bruce, MD  vitamin C (ASCORBIC ACID) 500 MG tablet Take 500 mg by mouth daily.   Yes [provider]  nystatin (MYCOSTATIN) 100000 UNIT/ML suspension Take 5 mLs (500,000 Units total) by mouth 4 (four) times daily. Patient not taking: Reported on 09/21/2016 09/25/15   [place], Holli Humbles, NP  pilocarpine (SALAGEN) 5 MG tablet Take 1 tablet (5 mg total) by mouth 2 (two) times daily. Patient not taking: Reported on 09/21/2016 10/02/15   [place], Holli Humbles, NP  ranitidine (ZANTAC) 150 MG tablet Take 1 tablet (150 mg total) by mouth 2 (two) times daily. Patient not taking: Reported on 09/26/2016 09/13/16 09/23/16  Duffy Bruce, MD    Physical Exam: Vitals:   09/26/16 1325 09/26/16 1330 09/26/16 1349 09/26/16 1400  BP: 117/75 111/70 132/78 (!) 141/73  Pulse: (!) 106  100 (!) 105  Resp: 16  16   Temp:      TempSrc:      SpO2: 97%  95% 96%  Weight:      Height:        Constitutional: mild distress due to pain, uncomfortable.  Vitals:   09/26/16 1325 09/26/16 1330 09/26/16 1349 09/26/16 1400  BP: 117/75 111/70 132/78 (!) 141/73  Pulse: (!) 106  100 (!) 105  Resp: 16  16   Temp:      TempSrc:      SpO2: 97%  95% 96%  Weight:      Height:       Eyes: PERRL, lids and conjunctivae normal ENMT: Mucous membranes are moist. Posterior pharynx clear of any exudate or lesions.Normal dentition.  Neck: normal, supple, no masses, no thyromegaly Respiratory: clear to auscultation bilaterally, no wheezing, no crackles. Normal respiratory effort. No accessory  muscle use.  Cardiovascular: Regular rate and rhythm, no murmurs / rubs / gallops. No extremity edema. 2+ pedal pulses. No carotid bruits.  Abdomen:mild  tenderness, no masses palpated. No hepatosplenomegaly. Bowel sounds decrease, no rigidity, soft.    Musculoskeletal: no clubbing / cyanosis. No joint deformity upper and lower extremities. Good ROM, no contractures. Normal muscle tone.  Skin: no rashes, lesions, ulcers. No induration Neurologic: CN 2-12 grossly intact. Sensation intact, DTR normal. Strength 5/5 in all 4.  Psychiatric: Normal judgment and insight. Alert and oriented x 3. Normal mood.   (Anything < 9 systems with 2 bullets each down codes to level 1) (If patient refuses exam can't bill higher level) (Make sure to document decubitus ulcers present on admission -- if possible -- and whether patient has chronic indwelling catheter at time of admission)  Labs on Admission: I have personally reviewed following labs and imaging studies  CBC:  Recent Labs Lab 09/21/16 1141 09/22/16 0431 09/25/16 0914 09/26/16 1121  WBC 3.3* 3.5* 3.8* 3.3*  NEUTROABS 1.7  --  2.0  --   HGB 10.7* 10.6* 12.8 11.1*  HCT 33.3* 33.8* 38.5 34.2*  MCV 96.8 99.4 98 96.9  PLT 171 176 207 235   Basic Metabolic Panel:  Recent Labs Lab 09/21/16 1141 09/22/16 0431 09/25/16 0914 09/26/16 1121  NA 139 139 134 136  K 3.9 4.0 4.0 3.6  CL 111 113* 103 105  CO2 20* 17* 25 20*  GLUCOSE 89 73 105 91  BUN 8 7 4* 7  CREATININE 0.94 1.05* 1.1 0.98  CALCIUM 7.7* 7.2* 9.0 8.0*   GFR: Estimated Creatinine Clearance: 43.2 mL/min (by C-G formula based on SCr of 0.98 mg/dL). Liver Function Tests:  Recent Labs Lab 09/21/16 1141 09/22/16 0431 09/25/16 0914 09/26/16 1121  AST 28 26 33 29  ALT 21 20 25 22   ALKPHOS 46 42 57 52  BILITOT 0.7 1.0 0.60 0.4  PROT 6.1* 5.6* 6.7 6.1*  ALBUMIN 3.6 3.3* 3.7 3.5    Recent Labs Lab 09/21/16 1141 09/26/16 1121  LIPASE 19 23   No results for input(s): AMMONIA in the last 168 hours. Coagulation Profile: No results for input(s): INR, PROTIME in the last 168 hours. Cardiac Enzymes: No results for input(s): CKTOTAL, CKMB, CKMBINDEX, TROPONINI in the last 168 hours. BNP (last 3 results) No results for  input(s): PROBNP in the last 8760 hours. HbA1C: No results for input(s): HGBA1C in the last 72 hours. CBG: No results for input(s): GLUCAP in the last 168 hours. Lipid Profile: No results for input(s): CHOL, HDL, LDLCALC, TRIG, CHOLHDL, LDLDIRECT in the last 72 hours. Thyroid Function Tests: No results for input(s): TSH, T4TOTAL, FREET4, T3FREE, THYROIDAB in the last 72 hours. Anemia Panel: No results for input(s): VITAMINB12, FOLATE, FERRITIN, TIBC, IRON, RETICCTPCT in the last 72 hours. Urine analysis:    Component Value Date/Time   COLORURINE YELLOW 09/26/2016 1130   APPEARANCEUR CLEAR 09/26/2016 1130   LABSPEC 1.026 09/26/2016 1130   LABSPEC 1.020 09/25/2015 1056   PHURINE 5.0 09/26/2016 1130   GLUCOSEU NEGATIVE 09/26/2016 1130   HGBUR NEGATIVE 09/26/2016 1130   BILIRUBINUR NEGATIVE 09/26/2016 1130   KETONESUR 80 (A) 09/26/2016 1130   PROTEINUR NEGATIVE 09/26/2016 1130   UROBILINOGEN 0.2 09/25/2015 1056   NITRITE NEGATIVE 09/26/2016 1130   LEUKOCYTESUR LARGE (A) 09/26/2016 1130    Radiological Exams on Admission: Dg Abd 1 View  Result Date: 09/26/2016 CLINICAL DATA:  71 y/o F; metastatic renal cell carcinoma to the bone  and recent EGD showing gastric adenocarcinoma. Patient complains of abdominal pain for about 3 weeks. EXAM: ABDOMEN - 1 VIEW COMPARISON:  09/25/2016 CT abdomen and pelvis. FINDINGS: Nonobstructive bowel gas pattern. There is retained contrast within the colon from prior CT of abdomen and pelvis. Multiple lucent lesions are present throughout the spine compatible with metastatic disease and there is a stable compression deformity of the T12 vertebral body post kyphoplasty with focal levocurvature. IMPRESSION: 1. Nonobstructive bowel gas pattern. Retained contrast within the colon from prior CT of abdomen and pelvis. 2. Multiple lytic lesions in the spine and stable compression deformity of T12 vertebral body post kyphoplasty with focal levocurvature. Electronically  Signed   By: Kristine Garbe M.D.   On: 09/26/2016 16:22   Ct Abdomen Pelvis W Contrast  Result Date: 09/25/2016 CLINICAL DATA:  Abdominal pain for the past 2.5 weeks. Previous cholecystectomy, appendectomy and inguinal hernia repair. History of renal cell carcinoma metastatic to the bone diagnosed in 2016. EXAM: CT ABDOMEN AND PELVIS WITH CONTRAST TECHNIQUE: Multidetector CT imaging of the abdomen and pelvis was performed using the standard protocol following bolus administration of intravenous contrast. CONTRAST:  152mL ISOVUE-300 IOPAMIDOL (ISOVUE-300) INJECTION 61% COMPARISON:  Abdomen radiographs obtained earlier today. Abdomen and pelvis CT dated 09/19/2016 and chest CT dated 09/11/2016. FINDINGS: Lower chest: Multiple lung nodules are again demonstrated at the lung bases. These demonstrate a mild increase in size and number. The largest is at the right lateral lung base, measuring 1.4 x 1.3 cm in maximum diameter on image number 1 of series 4 today. This measured 1.1 x 0.9 cm on 09/19/2016 and 1.1 x 0.9 cm on 09/11/2016. Hepatobiliary: A 80 lobulated, heterogeneously enhancing mass in the lateral segment of the left lobe of the liver is similar to the previous examinations, measuring 2.6 x 2.2 cm on coronal image number 40. A tiny probable cyst in the right lobe of the liver has not changed significantly. Cholecystectomy clips. Pancreas: Stable atrophy of the head, neck, body and proximal tail and mild atrophy of the distal tail. Spleen: Normal in size without focal abnormality. Adrenals/Urinary Tract: Little change in the heterogeneously enhancing mass in the mid right kidney. This measures 1.2 cm in AP diameter on image number 26 of series 2 and 1.6 x 1.3 cm on coronal image number 66. This previously measured 1.3 cm in maximum diameter. Multiple right renal cysts are also again demonstrated. Surgically absent left kidney. A heterogeneously enhancing left adrenal mass is again demonstrated,  measuring 3.5 x 2.5 cm on image number 20 of series 2. This measured 3.0 x 2.0 cm on 09/11/2016. There is also a mildly heterogeneous right adrenal mass measuring 1.5 x 1.5 cm on image number 21 of series 2. This measured 1.7 x 1.1 cm on 09/11/2016. An additional 1.2 x 0.9 cm heterogeneously enhancing right adrenal mass on image number 18 of series 2 measured 1.0 x 0.8 cm on 09/11/2016. Unremarkable urinary bladder and right ureter. Stomach/Bowel: Increased caliber of the cecum, right colon and transverse colon, containing oral contrast without wall thickening, stricture or mass. Unremarkable stomach and small bowel. Vascular/Lymphatic: Atheromatous arterial calcifications without aneurysm. Mild increase in size and confluence of retroperitoneal lymph nodes. The largest is an aortocaval node with a short axis diameter of 9 mm on image number 29 of series 2. Reproductive: Status post hysterectomy. No adnexal masses. Other: No abdominal wall hernia or abnormality. No abdominopelvic ascites. Musculoskeletal: Multiple lytic and sclerotic spine metastases without significant change. Stable sclerosis in  collapse of the T12 vertebral body with kyphoplasty material. IMPRESSION: 1. No acute abnormality. There has been increase in caliber of the cecum, right colon and transverse colon without evidence of obstruction. 2. Mildly progressive metastatic disease at the lung bases. 3. Stable probable metastasis in the lateral segment of the left lobe of the liver. 4. 1.6 cm mid right renal mass with a mild increase in size, suspicious for a primary right renal neoplasm or metastasis. 5. 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 6. Minimally progressive retroperitoneal adenopathy, most likely metastatic. 7. No significant change in extensive spinal metastatic disease. Electronically Signed   By: Claudie Revering M.D.   On: 09/25/2016 18:34   Dg Abd 2 Views  Result Date: 09/25/2016 CLINICAL DATA:  Abdominal pain  with bloating. Known metastatic renal carcinoma EXAM: ABDOMEN - 2 VIEW COMPARISON:  CT abdomen and pelvis September 19, 2016 FINDINGS: Supine and upright images were obtained. There are loops of dilated bowel with multiple air-fluid levels. No free air appreciable. There are surgical clips in the right upper abdomen. There is thoracolumbar levoscoliosis, stable. There is evidence of fracture at T12 with metastatic disease in this area. IMPRESSION: Findings felt to be indicative of a degree of bowel obstruction. No free air. Fracture with bony metastasis at T12 again noted. There is thoracolumbar levoscoliosis. These results will be called to the ordering clinician or representative by the Radiologist Assistant, and communication documented in the PACS or zVision Dashboard. Electronically Signed   By: Lowella Grip III M.D.   On: 09/25/2016 13:16    JYN:WGNF available.   Assessment/Plan Active Problems:   Hypertension   Metastatic renal cell carcinoma to bone (HCC)   Malignant neoplasm metastatic to bone (HCC)   Carcinoma of kidney (HCC)   Chronic constipation   Abdominal pain   Uncontrolled pain   Acute lower UTI   Leukopenia due to antineoplastic chemotherapy [facility])   1-Metastatic Renal cell carcinoma, new Diagnosis of Gastric cancer; Severe uncontrolled pain, back and abdomen; Pain likely related to malignancy.  Appreciate Dr Hilma Favors help with pain management.  IV Protonix BID.  Continue with Carafate.  Started on PCA pump.  IV decadron.  2-Constipation.  Suppository ordered.  KUB negative for obstruction.  IV fluids.   3-Poor oral Intake; IV fluids. Clear diet.  Pain controlled.  Treat constipation.   4-UTI; Patient was suppose to be taking antibiotics for UTI.  Last Urine culture on July grew Pseudomonas.  Urine culture.  Continue with cefepime.   5-Leukopenia; in setting of chemo.  Treat UTI.  Monitor for fever.         DVT prophylaxis: SCD due to gastric cancer,  ulcer. .  Code Status: Full code.  Family Communication: care discussed with daughter who was at bedside.  Disposition Plan: home at time of discharge Consults called: Palliative care, Dr Marin Olp added to rounding team.  Admission status: inpatient , suspect more than 2 days for pain controlled.    Elmarie Shiley MD [facility] Pager [phone]  If 7PM-7AM, please contact night-coverage [email or web address] Password [facility]  09/26/2016, 4:47 PM

## 2016-09-26 NOTE — ED Notes (Signed)
PT DECLINES MAG-CITRATE AND BISCODYL SUPP UNTIL SHE IN IN ROOM UPSTAIRS. BEDSIDE COMMODE IS PRESENT IN PT'S ROOM SHE WOULD PREFER PRIVATE BATHROOM

## 2016-09-26 NOTE — ED Notes (Signed)
PALLIATIVE Provider at bedside.

## 2016-09-27 ENCOUNTER — Ambulatory Visit (HOSPITAL_BASED_OUTPATIENT_CLINIC_OR_DEPARTMENT_OTHER): Payer: Medicare PPO

## 2016-09-27 DIAGNOSIS — K259 Gastric ulcer, unspecified as acute or chronic, without hemorrhage or perforation: Secondary | ICD-10-CM

## 2016-09-27 DIAGNOSIS — K5903 Drug induced constipation: Secondary | ICD-10-CM

## 2016-09-27 DIAGNOSIS — C799 Secondary malignant neoplasm of unspecified site: Secondary | ICD-10-CM

## 2016-09-27 DIAGNOSIS — G893 Neoplasm related pain (acute) (chronic): Principal | ICD-10-CM

## 2016-09-27 DIAGNOSIS — C641 Malignant neoplasm of right kidney, except renal pelvis: Secondary | ICD-10-CM

## 2016-09-27 DIAGNOSIS — T402X5A Adverse effect of other opioids, initial encounter: Secondary | ICD-10-CM

## 2016-09-27 LAB — BASIC METABOLIC PANEL
Anion gap: 10 (ref 5–15)
BUN: 9 mg/dL (ref 6–20)
CO2: 20 mmol/L — ABNORMAL LOW (ref 22–32)
Calcium: 7.8 mg/dL — ABNORMAL LOW (ref 8.9–10.3)
Chloride: 108 mmol/L (ref 101–111)
Creatinine, Ser: 0.9 mg/dL (ref 0.44–1.00)
Glucose, Bld: 157 mg/dL — ABNORMAL HIGH (ref 65–99)
POTASSIUM: 4.1 mmol/L (ref 3.5–5.1)
SODIUM: 138 mmol/L (ref 135–145)

## 2016-09-27 LAB — CBC
HEMATOCRIT: 32.8 % — AB (ref 36.0–46.0)
Hemoglobin: 10.6 g/dL — ABNORMAL LOW (ref 12.0–15.0)
MCH: 31.5 pg (ref 26.0–34.0)
MCHC: 32.3 g/dL (ref 30.0–36.0)
MCV: 97.6 fL (ref 78.0–100.0)
PLATELETS: 192 10*3/uL (ref 150–400)
RBC: 3.36 MIL/uL — AB (ref 3.87–5.11)
RDW: 18.7 % — AB (ref 11.5–15.5)
WBC: 2.9 10*3/uL — AB (ref 4.0–10.5)

## 2016-09-27 MED ORDER — SENNA 8.6 MG PO TABS
2.0000 | ORAL_TABLET | Freq: Two times a day (BID) | ORAL | Status: DC
  Administered 2016-09-28 – 2016-09-29 (×3): 17.2 mg via ORAL
  Filled 2016-09-27 (×7): qty 2

## 2016-09-27 NOTE — Consult Note (Signed)
Referral MD  Reason for Referral: Metastatic renal cell carcinoma; abdominal pain   Chief Complaint  Patient presents with  . Abdominal Pain  : I am in a lot of pain.  HPI: Cathy Lewis is well-known to me. She is a very nice 71 year old white female. She has renal cell carcinoma. We have been treating her for over 2 years. She has done quite well. She currently is on Cabometyx.  Over the past few weeks, she been having worsening abdominal pain. She has had multiple evaluations. She has had a upper endoscopy done on July 30. This showed an ulcer in the stomach. This was biopsied. 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  I spoke to the pathologist. They feel this is actually her metastatic renal cell carcinoma. Not surprising, they do not have enough tissue to do additional studies.  She has had multiple scans. She does not have any obvious obstruction. Her most recent scan showed some dilation of the cecum, right colon and transverse colon. There was thought to be some mildly progressive disease of the lung bases. She has a 1.6 and later mid right renal mass. There is mild increase in probable bilateral adrenal metastases. Minimally progressive retroperitoneal adenopathy.  She currently is on a PCA Dilaudid infusion. This is helping.  She is being followed by palliative care.  Her labs do not show any obvious hypercalcemia. Her albumin is 3.5. She is slightly anemic. Her white cell count is 2.9. I'm sure this is to some degree iatrogenic.  She does appear more comfortable. She does have some abdominal distention. When I listened for bowel sounds, they are somewhat decreased.  She has had some bowel movements.  Overall, her performance status is ECOG 3.   Past Medical History:  Diagnosis Date  . Arthritis    arthritis in knees and back   . Asthma   . Atypical chest pain    NORMAL CARDIOLITE STUDY   . Complication of anesthesia    hard to wake up from  umb hernia-was given alot of meds  . Depression   . Dyslipidemia   . GERD (gastroesophageal reflux disease)   . Headache   . Hypertension   . 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 Seasonal allergies   . Syncope    HISTORY OF  :  Past Surgical History:  Procedure Laterality Date  . ABDOMINAL HYSTERECTOMY  2004   rectocele  . APPENDECTOMY    . BUNIONECTOMY Left 01-11-2013   left foot  . BUNIONECTOMY Right 12-31-2006   with hammer toe  . CARPAL TUNNEL RELEASE     R  . CHOLECYSTECTOMY N/A 08/27/2015   Procedure: LAPAROSCOPIC CHOLECYSTECTOMY WITH INTRAOPERATIVE CHOLANGIOGRAM;  Surgeon: [name], MD;  Location: [place];  Service: General;  Laterality: N/A;  . COLONOSCOPY    . CYSTOCELE REPAIR    . DILATION AND CURETTAGE OF UTERUS    . ESOPHAGOGASTRODUODENOSCOPY N/A 09/22/2016   Procedure: ESOPHAGOGASTRODUODENOSCOPY (EGD);  Surgeon: Mauri Pole, MD;  Location: [facility];  Service: Endoscopy;  Laterality: N/A;  . HERNIA REPAIR  2014    umb, right and left  . INGUINAL HERNIA REPAIR Left 01/03/2014   Procedure: LEFT INGUINAL HERNIA REPAIR ;  Surgeon: [name], MD;  Location: [place];  Service: General;  Laterality: Left;  . INGUINAL HERNIA REPAIR Right 04/06/2015   Procedure: HERNIA REPAIR RIGHT  INGUINAL ADULT WITH MESH;  Surgeon: [name], MD;  Location: [facility];  Service: General;  Laterality: Right;  . INSERTION OF MESH Left 01/03/2014   Procedure: INSERTION OF MESH;  Surgeon: [name], MD;  Location: [place];  Service: General;  Laterality: Left;  . INSERTION OF MESH Right 04/06/2015   Procedure: INSERTION OF MESH;  Surgeon: [name], MD;  Location: [facility];  Service: General;  Laterality: Right;  . IR GENERIC HISTORICAL  07/03/2015   IR RADIOLOGIST EVAL & MGMT 07/03/2015 Marybelle Killings, MD [facility]  . IR GENERIC HISTORICAL  05/24/2015   IR RADIOLOGIST EVAL & MGMT 05/24/2015 Marybelle Killings,  MD [facility]  . JOINT REPLACEMENT    . 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 OTHER SURGICAL HISTORY     bladder vault prolapse  . OTHER SURGICAL HISTORY     right foot bunion and hammer toe surgery   . OTHER SURGICAL HISTORY     right knee arthroscopic   . OTHER SURGICAL HISTORY     right arm ( underarm ) surgery removed ? cyst   . TOTAL KNEE ARTHROPLASTY  03/11/2011   Procedure: TOTAL KNEE ARTHROPLASTY;  Surgeon: Cynda Familia, MD;  Location: [facility];  Service: Orthopedics;  Laterality: Right;  . TUBAL LIGATION     1977  :   Current Facility-Administered Medications:  .  albuterol (PROVENTIL) (2.5 MG/3ML) 0.083% nebulizer solution 3 mL, 3 mL, Inhalation, Q4H PRN, Regalado, Belkys A, MD .  amLODipine (NORVASC) tablet 5 mg, 5 mg, Oral, Daily, Regalado, Belkys A, MD, 5 mg at 09/27/16 1004 .  bisacodyl (DULCOLAX) suppository 10 mg, 10 mg, Rectal, Daily PRN, Regalado, Belkys A, MD .  ceFEPIme (MAXIPIME) 1 g in dextrose 5 % 50 mL IVPB, 1 g, Intravenous, Q12H, Gadhia, Jigna M, RPH, Stopped at 09/27/16 0700 .  dexamethasone (DECADRON) injection 8 mg, 8 mg, Intravenous, Q24H, Golding, Elizabeth L, DO, 8 mg at 09/26/16 1711 .  dextrose 5 %-0.9 % sodium chloride infusion, , Intravenous, Continuous, Regalado, Belkys A, MD, Last Rate: 75 mL/hr at 09/26/16 2024 .  dicyclomine (BENTYL) tablet 20 mg, 20 mg, Oral, TID PRN, Regalado, Belkys A, MD .  diphenhydrAMINE (BENADRYL) injection 12.5 mg, 12.5 mg, Intravenous, Q6H PRN **OR** diphenhydrAMINE (BENADRYL) 12.5 MG/5ML elixir 12.5 mg, 12.5 mg, Oral, Q6H PRN, Hilma Favors, Elizabeth L, DO .  fluticasone (FLONASE) 50 MCG/ACT nasal spray 1 spray, 1 spray, Each Nare, Daily, Regalado, Belkys A, MD .  HYDROmorphone (DILAUDID) 1 mg/mL PCA injection, , Intravenous, Q4H, Golding, Elizabeth L, DO .  HYDROmorphone (DILAUDID) injection 0.5 mg, 0.5 mg, Intravenous, Q2H PRN, Lane Hacker L, DO, 0.5 mg at 09/26/16 1534 .  metoCLOPramide (REGLAN)  tablet 10 mg, 10 mg, Oral, Q8H PRN, Regalado, Belkys A, MD .  mometasone-formoterol (DULERA) 100-5 MCG/ACT inhaler 2 puff, 2 puff, Inhalation, BID, Regalado, Belkys A, MD, 2 puff at 09/27/16 0847 .  montelukast (SINGULAIR) tablet 10 mg, 10 mg, Oral, QHS, Regalado, Belkys A, MD, 10 mg at 09/26/16 2205 .  naloxone [facility]) injection 0.4 mg, 0.4 mg, Intravenous, PRN **AND** sodium chloride flush (NS) 0.9 % injection 9 mL, 9 mL, Intravenous, PRN, Lane Hacker L, DO .  ondansetron (ZOFRAN) injection 4 mg, 4 mg, Intravenous, Q6H PRN, Lane Hacker L, DO .  pantoprazole (PROTONIX) injection 40 mg, 40 mg, Intravenous, Q12H, Regalado, Belkys A, MD, 40 mg at 09/27/16 1003 .  senna (SENOKOT) tablet 17.2 mg, 2 tablet, Oral, BID, Domingo Cocking, Gene, MD .  sucralfate (CARAFATE) 1 GM/10ML suspension 1 g, 1  g, Oral, TID WC & HS, Regalado, Belkys A, MD, 1 g at 09/27/16 1003:  . amLODipine  5 mg Oral Daily  . dexamethasone  8 mg Intravenous Q24H  . fluticasone  1 spray Each Nare Daily  . HYDROmorphone   Intravenous Q4H  . mometasone-formoterol  2 puff Inhalation BID  . montelukast  10 mg Oral QHS  . pantoprazole (PROTONIX) IV  40 mg Intravenous Q12H  . senna  2 tablet Oral BID  . sucralfate  1 g Oral TID WC & HS  :  Allergies  Allergen Reactions  . Azithromycin Other (See Comments)    Stomach cramps.  Green black stool   . Ciprofloxacin Other (See Comments)    Body aches Chills Heart races  . Etodolac Palpitations and Other (See Comments)    Joints hurt   . Gnp [Acetaminophen] Other (See Comments)    "GRN" (cough syrup) Heart races  . Moxifloxacin Palpitations and Other (See Comments)    Body ache  . Tramadol Nausea And Vomiting and Other (See Comments)    Dizziness  . Rofecoxib Other (See Comments)    Pt unsure   :  Family History  Problem Relation Age of Onset  . Heart attack Father        DECEASED  . Cancer Mother        DECEASED-unsure of type  . Cancer Brother        LUNG  CA- smoked  . Aneurysm Brother        AAA  . Asthma Sister   :  Social History   Social History  . Marital status: Married    Spouse name: N/A  . Number of children: N/A  . Years of education: N/A   Occupational History  . Not on file.   Social History Main Topics  . Smoking status: Former Smoker    Packs/day: 0.25    Years: 10.00    Quit date: 02/24/1985  . Smokeless tobacco: Never Used  . Alcohol use No  . Drug use: No  . Sexual activity: Not on file   Other Topics Concern  . Not on file   Social History Narrative  . No narrative on file  :  Pertinent items are noted in HPI.  Exam: Patient Vitals for the past 24 hrs:  BP Temp Temp src Pulse Resp SpO2 Height Weight  09/27/16 0739 - - - - 12 100 % - -  09/27/16 0628 - - - - - - - 118 lb 4 oz (53.6 kg)  09/27/16 0433 - - - 97 12 99 % - -  09/27/16 0414 120/87 (!) 97.5 F (36.4 C) Oral (!) 102 13 98 % - -  09/27/16 0400 - - - - 12 97 % - -  09/27/16 0204 133/76 98.5 F (36.9 C) Oral (!) 104 12 96 % - -  09/27/16 0000 - - - (!) 102 16 98 % - -  09/26/16 2259 - - - - - 96 % - -  09/26/16 2241 126/61 97.9 F (36.6 C) Oral (!) 105 15 96 % - -  09/26/16 2030 - - - (!) 104 12 95 % - -  09/26/16 2000 - - - (!) 109 12 93 % - -  09/26/16 1819 (!) 137/122 98.2 F (36.8 C) Oral (!) 101 18 96 % 5\' 4"  (1.626 m) 118 lb 13.3 oz (53.9 kg)  09/26/16 1744 (!) 144/76 97.8 F (36.6 C) Oral 60 16 96 % - -  09/26/16 1730 (!) 143/77 - - 60 - 96 % - -  09/26/16 1700 (!) 145/72 - - - - - - -  09/26/16 1600 (!) 143/71 - - 100 - 92 % - -  09/26/16 1400 (!) 141/73 - - (!) 105 - 96 % - -  09/26/16 1349 132/78 - - 100 16 95 % - -  09/26/16 1330 111/70 - - - - - - -  09/26/16 1325 117/75 - - (!) 106 16 97 % - -    As above    Recent Labs  09/26/16 1121 09/27/16 0407  WBC 3.3* 2.9*  HGB 11.1* 10.6*  HCT 34.2* 32.8*  PLT 186 192    Recent Labs  09/26/16 1121 09/27/16 0407  NA 136 138  K 3.6 4.1  CL 105 108  CO2 20* 20*   GLUCOSE 91 157*  BUN 7 9  CREATININE 0.98 0.90  CALCIUM 8.0* 7.8*    Blood smear review:  None  Pathology: None     Assessment and Plan:  Cathy Lewis is a 71 year old white female. She has metastatic renal cell carcinoma. She initially had stage IV disease back in 2013. She had bony metastases. She has had a left nephrectomy. This was in January 2014.  She initially was treated with Votrient. She completed this in August 2016. She was then switched to [place]. She was on Opdivo for quite a while. She then had again progression. We then switched her over to Cabometyx.  Our goal at this point is her quality of life. I really need to make sure that she has good functionality. I want her to be able to enjoy her family and enjoying the summer and fall.  There are certainly other options that we have that week he utilized to treat her. However, her performance status right now is just not good enough, and my opinion, to be able to handle any additional therapy.  I think that we should consider a celiac block for her. She is behaving as if this is pancreatic cancer with involvement of the celiac plexus. Her pain seems to be similar to those who have pancreatic cancer.  I talked her about this. She would be agreeable to try this procedure. I will see if interventional radiology can do this for [place].  For right now, she seems to be comfortable on the Dilaudid infusion.  Hopefully, she can get her appetite a little bit better. Hopefully she will be able to be advanced to regular food.  She has done quite well to date. I just feel bad that she has had all this pain area and hopefully, we will be able to help with this pain and get her back to a better quality of life and performance status.  She is very impressed with the nursing staff on [facility]. I am not surprised by this.  I appreciate everybody's help in her care.  [name], MD  [place] 15:26

## 2016-09-27 NOTE — Progress Notes (Signed)
PROGRESS NOTE    Cathy Lewis  OFB:[id number] DOB: [birth year] DOA: 09/26/2016 PCP: Christain Sacramento, MD    Brief Narrative: Cathy Lewis is a 71 y.o. 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 Patient with frequents ED visit due to severe , uncontrolled pain. She report also poor oral intake. She is complaining of back pain, and abdominal pain. Denies vomiting, but she has not been able to eat. She has not had a BM in days. She has pass flatus. She is suppose to be taking antibiotics for UTI, antibiotics prescribe by PCP.  She denies dysuria, chest pain or dyspnea.   ED Course: Patient presents complaining of severe pain, leukopenia with WBC at 3.3, Hb at 11, K at 3.6, CO@ 20, UA with too numerous to count WBC. KUB non obstructive bowel pattern.     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  Appreciate Dr Domingo Cocking  help with pain management.  IV Protonix BID.  Continue with Carafate.  Started on PCA pump.  IV decadron.  Dr Marin Olp recommends celiac plexus block. IR consulted.   2-Constipation.  Suppository ordered.  KUB negative for obstruction.  IV fluids.  She has had multiples bowel movement.   3-Poor oral Intake; IV fluids.  Advance diet  Pain controlled.  Treat constipation.   4-UTI; Patient was suppose to be taking antibiotics for UTI.  Last Urine  culture on July grew Pseudomonas.  Urine culture. Follow results  Continue with cefepime day 2.   5-Leukopenia; in setting of chemo.  Treat UTI.  Monitor for fever.      DVT prophylaxis: SCD.  Code Status: full code.  Family Communication: care discussed with patient  Disposition Plan: remain in the hospital for pain controlled.   Consultants:   Palliative care.   Dr Marin Olp.    Procedures:   none   Antimicrobials:   Cefepime 8-03   Subjective: She is feeling better, abdominal pain has improved. She has had multiples bowel movement.    Objective: Vitals:   09/27/16 0628 09/27/16 0739 09/27/16 1318 09/27/16 1341  BP:    (!) 117/59  Pulse:    93  Resp:  12 13 16   Temp:    98.9 F (37.2 C)  TempSrc:    Oral  SpO2:  100% 98% 99%  Weight: 53.6 kg (118 lb 4 oz)     Height:        Intake/Output Summary (Last 24 hours) at 09/27/16 1359 Last data filed at 09/27/16 0700  Gross per 24 hour  Intake             1160 ml  Output                0 ml  Net             1160 ml   Filed Weights   09/26/16 1018 09/26/16 1819 09/27/16 7824  Weight: 53.5 kg (118 lb) 53.9 kg (118 lb 13.3 oz) 53.6 kg (118 lb 4 oz)    Examination:  General exam: Appears calm and comfortable  Respiratory system: Clear to auscultation. Respiratory effort normal. Cardiovascular system: S1 & S2 heard, RRR. No JVD, murmurs, rubs, gallops or clicks. No pedal edema. Gastrointestinal system: Abdomen is mild distended, soft and mild tender. No organomegaly or masses felt. Normal bowel sounds heard. Central nervous system: Alert and oriented. No focal neurological deficits. Extremities: Symmetric 5 x 5 power. Skin: No rashes, lesions or ulcers Psychiatry: Judgement and insight appear normal. Mood & affect appropriate.     Data Reviewed: I have personally reviewed following labs and imaging studies  CBC:  Recent Labs Lab 09/21/16 1141 09/22/16 0431 09/25/16 0914 09/26/16 1121  09/27/16 0407  WBC 3.3* 3.5* 3.8* 3.3* 2.9*  NEUTROABS 1.7  --  2.0  --   --   HGB 10.7* 10.6* 12.8 11.1* 10.6*  HCT 33.3* 33.8* 38.5 34.2* 32.8*  MCV 96.8 99.4 98 96.9 97.6  PLT 171 176 207 186 081   Basic Metabolic Panel:  Recent Labs Lab 09/21/16 1141 09/22/16 0431 09/25/16 0914 09/26/16 1121 09/27/16 0407  NA 139 139 134 136 138  K 3.9 4.0 4.0 3.6 4.1  CL 111 113* 103 105 108  CO2 20* 17* 25 20* 20*  GLUCOSE 89 73 105 91 157*  BUN 8 7 4* 7 9  CREATININE 0.94 1.05* 1.1 0.98 0.90  CALCIUM 7.7* 7.2* 9.0 8.0* 7.8*   GFR: Estimated Creatinine Clearance: 47.1 mL/min (by C-G formula based on SCr of 0.9 mg/dL). Liver Function Tests:  Recent Labs Lab 09/21/16 1141 09/22/16 0431 09/25/16 0914 09/26/16 1121  AST 28 26 33 29  ALT 21 20 25 22   ALKPHOS 46 42 57 52  BILITOT 0.7 1.0 0.60 0.4  PROT 6.1* 5.6* 6.7 6.1*  ALBUMIN 3.6 3.3* 3.7 3.5    Recent Labs Lab 09/21/16 1141 09/26/16 1121  LIPASE 19 23   No results for input(s): AMMONIA in the last 168 hours. Coagulation Profile: No results for input(s): INR, PROTIME in the last 168 hours. Cardiac Enzymes: No results for input(s): CKTOTAL, CKMB, CKMBINDEX, TROPONINI in the last 168 hours. BNP (last 3 results) No results for input(s): PROBNP in the last 8760 hours. HbA1C: No results for input(s): HGBA1C in the last 72 hours. CBG: No results for input(s): GLUCAP in the last 168 hours. Lipid Profile: No results for input(s): CHOL, HDL, LDLCALC, TRIG, CHOLHDL, LDLDIRECT in the last 72 hours. Thyroid Function Tests: No results for input(s): TSH, T4TOTAL, FREET4, T3FREE, THYROIDAB in the last 72 hours. Anemia Panel: No results for input(s): VITAMINB12, FOLATE, FERRITIN, TIBC, IRON, RETICCTPCT in the last 72 hours. Sepsis Labs: No results for input(s): PROCALCITON, LATICACIDVEN in the last 168 hours.  No results found for this or any previous visit (from the past 240 hour(s)).       Radiology Studies: Dg Abd  1 View  Result Date: 09/26/2016 CLINICAL DATA:  71 y/o F; metastatic renal cell carcinoma to the bone and recent EGD showing gastric adenocarcinoma. Patient complains of abdominal pain for about 3 weeks. EXAM: ABDOMEN - 1 VIEW COMPARISON:  09/25/2016 CT abdomen and pelvis. FINDINGS: Nonobstructive bowel gas pattern. There is retained contrast within the colon from prior CT of abdomen and pelvis. Multiple lucent lesions are present throughout the spine compatible with metastatic disease and there is a stable compression deformity of the T12 vertebral body post kyphoplasty with  focal levocurvature. IMPRESSION: 1. Nonobstructive bowel gas pattern. Retained contrast within the colon from prior CT of abdomen and pelvis. 2. Multiple lytic lesions in the spine and stable compression deformity of T12 vertebral body post kyphoplasty with focal levocurvature. Electronically Signed   By: Kristine Garbe M.D.   On: 09/26/2016 16:22   Ct Abdomen Pelvis W Contrast  Result Date: 09/25/2016 CLINICAL DATA:  Abdominal pain for the past 2.5 weeks. Previous cholecystectomy, appendectomy and inguinal hernia repair. History of renal cell carcinoma metastatic to the bone diagnosed in 2016. EXAM: CT ABDOMEN AND PELVIS WITH CONTRAST TECHNIQUE: Multidetector CT imaging of the abdomen and pelvis was performed using the standard protocol following bolus administration of intravenous contrast. CONTRAST:  179mL ISOVUE-300 IOPAMIDOL (ISOVUE-300) INJECTION 61% COMPARISON:  Abdomen radiographs obtained earlier today. Abdomen and pelvis CT dated 09/19/2016 and chest CT dated 09/11/2016. FINDINGS: Lower chest: Multiple lung nodules are again demonstrated at the lung bases. These demonstrate a mild increase in size and number. The largest is at the right lateral lung base, measuring 1.4 x 1.3 cm in maximum diameter on image number 1 of series 4 today. This measured 1.1 x 0.9 cm on 09/19/2016 and 1.1 x 0.9 cm on 09/11/2016. Hepatobiliary:  A 80 lobulated, heterogeneously enhancing mass in the lateral segment of the left lobe of the liver is similar to the previous examinations, measuring 2.6 x 2.2 cm on coronal image number 40. A tiny probable cyst in the right lobe of the liver has not changed significantly. Cholecystectomy clips. Pancreas: Stable atrophy of the head, neck, body and proximal tail and mild atrophy of the distal tail. Spleen: Normal in size without focal abnormality. Adrenals/Urinary Tract: Little change in the heterogeneously enhancing mass in the mid right kidney. This measures 1.2 cm in AP diameter on image number 26 of series 2 and 1.6 x 1.3 cm on coronal image number 66. This previously measured 1.3 cm in maximum diameter. Multiple right renal cysts are also again demonstrated. Surgically absent left kidney. A heterogeneously enhancing left adrenal mass is again demonstrated, measuring 3.5 x 2.5 cm on image number 20 of series 2. This measured 3.0 x 2.0 cm on 09/11/2016. There is also a mildly heterogeneous right adrenal mass measuring 1.5 x 1.5 cm on image number 21 of series 2. This measured 1.7 x 1.1 cm on 09/11/2016. An additional 1.2 x 0.9 cm heterogeneously enhancing right adrenal mass on image number 18 of series 2 measured 1.0 x 0.8 cm on 09/11/2016. Unremarkable urinary bladder and right ureter. Stomach/Bowel: Increased caliber of the cecum, right colon and transverse colon, containing oral contrast without wall thickening, stricture or mass. Unremarkable stomach and small bowel. Vascular/Lymphatic: Atheromatous arterial calcifications without aneurysm. Mild increase in size and confluence of retroperitoneal lymph nodes. The largest is an aortocaval node with a short axis diameter of 9 mm on image number 29 of series 2. Reproductive: Status post hysterectomy. No adnexal masses. Other: No abdominal wall hernia or abnormality. No abdominopelvic ascites. Musculoskeletal: Multiple lytic and sclerotic spine metastases without  significant change. Stable sclerosis in collapse of the T12 vertebral body with kyphoplasty material. IMPRESSION: 1. No acute abnormality. There has been increase in caliber of the cecum, right colon and transverse colon without evidence of obstruction. 2. Mildly progressive metastatic disease at the lung bases. 3. Stable probable metastasis in the lateral segment of the left lobe of the liver. 4. 1.6 cm mid right renal mass with a mild increase in size, suspicious for a primary  right renal neoplasm or metastasis. 5. 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 6. Minimally progressive retroperitoneal adenopathy, most likely metastatic. 7. No significant change in extensive spinal metastatic disease. Electronically Signed   By: Claudie Revering M.D.   On: 09/25/2016 18:34        Scheduled Meds: . amLODipine  5 mg Oral Daily  . dexamethasone  8 mg Intravenous Q24H  . fluticasone  1 spray Each Nare Daily  . HYDROmorphone   Intravenous Q4H  . mometasone-formoterol  2 puff Inhalation BID  . montelukast  10 mg Oral QHS  . pantoprazole (PROTONIX) IV  40 mg Intravenous Q12H  . senna  2 tablet Oral BID  . sucralfate  1 g Oral TID WC & HS   Continuous Infusions: . ceFEPime (MAXIPIME) IV Stopped (09/27/16 0700)  . dextrose 5 % and 0.9% NaCl 75 mL/hr at 09/26/16 2024     LOS: 1 day    Time spent: 35 minutes.     Elmarie Shiley, MD [facility] Pager [phone]  If 7PM-7AM, please contact night-coverage [email or web address] Password [email or web address] 09/27/2016, 1:59 PM

## 2016-09-27 NOTE — ED Provider Notes (Signed)
Patient received at sign out from [place]. CT of the abdomen/pelvic pending. Per her HPI  "HPI Cathy Lewis is a 71 y.o. female presenting with 3 weeks of abdominal pain.   Patient with past medical history is significant for metastatic renal cell carcinoma presenting with worsening abdominal pain. Patient was seen at oncology this morning, where an abdominal film was indicative of bowel obstruction. Patient was suggested to come to the emergency room for further evaluation and possible admission.  Patient very distressed and provided minimal response due to being in pain. States she's been having abdominal pain for several weeks. Pain is currently on the right side, but she states it switches. Last bowel movement was on Tuesday, and it was nonbloody and normal consistency. She reports she has not had an appetite, and has not had nothing to eat today. She's been taking hydrocodone with short-term relief. She was given pain medicine (Dilaudid) at the oncology clinic, which provided relief for a short period of time. She denies fever, chills, chest pain, shortness of breath, cough, or urinary symptoms. Per chart review, patient has had multiple abdominal surgeries including hysterectomy, appendectomy, lap cholecystectomy, and 2 inguinal hernia repairs."    Physical Exam  BP 132/70 (BP Location: Left Arm)   Pulse 88   Temp 97.7 F (36.5 C) (Oral)   Resp 16   SpO2 98%   Physical Exam  Uncomfortable course appearing. Writhing in pain on initial evaluation. Significantly improved TTP of the entire abdomen after IV dilaudid.  ED Course  Procedures  MDM The patient was discussed with Dr. Wilson Singer, attending physician. CT abdomen pelvis demonstrating increased caliber of the cecum, right colon, and transverse colon. No evidence of obstruction. Discussed at length with the patient and her family that I recommended we pursue admission to the hospital for pain control. The patient is adamant that  she can control her pain at home and does not feel that admission to the hospital will help her symptoms. Participated and shared decision making with the patient, her husband, and 2 of the patient's children who are in agreement that the patient's symptoms can be controlled at home. Will send the patient home with oral Dilaudid and stop her current Norco. Encourage the patient to follow-up with her oncologist office tomorrow morning. Strict return precautions given including if the patient is unable to control her pain at home or if she develops bilious emesis. VSS. Pain controlled at d/c. Will d/c the patient to home.        Joline Maxcy A, PA-C 09/27/16 0244    Virgel Manifold, MD 09/28/16 [phone]

## 2016-09-27 NOTE — Progress Notes (Signed)
  Request seen for Celiac block  Will arrange for Tuesday 8/7 by Dr. Scheryl Darter Arminda Resides PA-C 09/27/2016 1:29 PM

## 2016-09-27 NOTE — Progress Notes (Signed)
Daily Progress Note   Patient Name: Cathy Lewis       Date: 09/27/2016 DOB: 04/19/1945  Age: 71 y.o. MRN#: [id number] Attending Physician: Elmarie Shiley, MD Primary Care Physician: Christain Sacramento, MD Admit Date: 09/26/2016  Reason for Consultation/Follow-up: Establishing goals of care, Non pain symptom management and Pain control  Subjective: I saw and examined Cathy Lewis this AM.    She reports that her pain is under much better control today.  Had BM this morning as well.  Attempted to resume discussion on her understanding of cancer and long term goals, but she is still needing time to process and not feeling physically well enough to do so.  We therefore discussed plan to continue to focus on helping her feel better today and revisiting goals in a day or two (she wants to discuss with [name] prior to making any decision anyway).  Length of Stay: 1  Current Medications: Scheduled Meds:  . amLODipine  5 mg Oral Daily  . dexamethasone  8 mg Intravenous Q24H  . fluticasone  1 spray Each Nare Daily  . HYDROmorphone   Intravenous Q4H  . mometasone-formoterol  2 puff Inhalation BID  . montelukast  10 mg Oral QHS  . pantoprazole (PROTONIX) IV  40 mg Intravenous Q12H  . sucralfate  1 g Oral TID WC & HS    Continuous Infusions: . ceFEPime (MAXIPIME) IV Stopped (09/27/16 0700)  . dextrose 5 % and 0.9% NaCl 75 mL/hr at 09/26/16 2024    PRN Meds: albuterol, bisacodyl, dicyclomine, diphenhydrAMINE **OR** diphenhydrAMINE, HYDROmorphone (DILAUDID) injection, metoCLOPramide, naloxone **AND** sodium chloride flush, ondansetron (ZOFRAN) IV  Physical Exam   General: Alert, awake, in no acute distress.  HEENT: No bruits, no goiter, no JVD Heart: Regular rate and rhythm. No murmur  appreciated. Lungs: Good air movement, clear Abdomen: Soft, nontender, nondistended, positive bowel sounds.  Ext: No significant edema Skin: Warm and dry Neuro: Grossly intact, nonfocal.         Vital Signs: BP 120/87 (BP Location: Left Arm)   Pulse 97   Temp (!) 97.5 F (36.4 C) (Oral)   Resp 12   Ht 5\' 4"  (1.626 m)   Wt 53.6 kg (118 lb 4 oz)   SpO2 100%   BMI 20.30 kg/m  SpO2: SpO2: 100 %  O2 Device: O2 Device: Nasal Cannula O2 Flow Rate: O2 Flow Rate (L/min): 1 L/min  Intake/output summary:  Intake/Output Summary (Last 24 hours) at 09/27/16 0957 Last data filed at 09/27/16 0700  Gross per 24 hour  Intake             1160 ml  Output                0 ml  Net             1160 ml   LBM: Last BM Date: 09/27/16 Baseline Weight: Weight: 53.5 kg (118 lb) Most recent weight: Weight: 53.6 kg (118 lb 4 oz)       Palliative Assessment/Data:      Patient Active Problem List   Diagnosis Date Noted  . Uncontrolled pain 09/26/2016  . Acute lower UTI 09/26/2016  . Leukopenia due to antineoplastic chemotherapy ([place]) 09/26/2016  . Chronic left-sided thoracic back pain   . Abdominal pain 09/21/2016  . Anemia 09/21/2016  . Abdominal pain, epigastric   . Chronic constipation   . Presbyopia of both eyes 03/17/2016  . GERD (gastroesophageal reflux disease) 10/09/2015  . Cough variant asthma 05/02/2015  . Metastatic renal cell carcinoma to bone ([place]) 11/20/2014  . Malignant neoplasm metastatic to bone ([place]) 08/29/2014  . H/O therapeutic radiation 08/29/2014  . Carcinoma of kidney ([place]) 08/29/2014  . LBP (low back pain) 08/29/2014  . Atypical chest pain   . Dyslipidemia   . Hypertension   . Syncope     Palliative Care Assessment & Plan   Patient Profile: 71 yo with metastatic Renal Cell Cancer, newly diagnosed Gastric adenocarcinoma followed and treated by [name], she is on daily oral chemotherapy Cabometyx. She has been seen 5 times in th ED for uncontrolled pain  and other symptoms in the setting of advanced cancer. She reports worsening symptoms for a month- severe pain in back, pain down her legs -difficulty getting comfortable in general. Not eating well. No BM in 4 days, feels full only passing bits of stool, but no vomiting. Was sent home on low dose oral dilaudid- has not had adequate pain relief.  Palliative consulted for symptom management and to begin discussion on goals of care.  Recommendations/Plan:  Pain: Reports pain currently well controlled.  She has used 3.5mg  of IV dilaudid since initiation of PCA.  Plan to continue PCA with current settings overnight to get a better idea on needs to control pain.  I think pain also improved today due to addition of steroids and also resolution of constipation.  She did have fentanyl patch on, but it was last changed 7/29 per date on patch.  Will hold on restarting patch and make decision on patch strength tomorrow based on PCA data.  Constipation: Reports large BM this AM.  Will plan to start senna 2 tabs BID and escalate bowel regimen as necessary.  She reports that she was using miralax at home without good response.  Goals of care: Continued discussion deferred per patient desire to "process everything" and get better control of symptoms prior to discussion.  Goals of Care and Additional Recommendations:  Limitations on Scope of Treatment: Full Scope Treatment  Code Status:    Code Status Orders        Start     Ordered   09/26/16 1934  Full code  Continuous     09/26/16 1933    Code Status History    Date Active Date Inactive Code Status  Order ID Comments User Context   09/21/2016  4:29 PM 09/22/2016  8:51 PM Full Code [id number]  Jani Gravel, MD ED   03/11/2011  5:56 PM 03/14/2011  3:47 PM Full Code [id number]  Iantha Fallen, RN Inpatient       Prognosis:   Unable to determine  Discharge Planning:  To Be Determined  Care plan was discussed with patient  Thank you for allowing the  Palliative Medicine Team to assist in the care of this patient.   Time In: 0910 Time Out: 0940 Total Time 30 Prolonged Time Billed No      Greater than 50%  of this time was spent counseling and coordinating care related to the above assessment and plan.  Micheline Rough, MD  Please contact Palliative Medicine Team phone at [phone] for questions and concerns.

## 2016-09-28 ENCOUNTER — Encounter (HOSPITAL_COMMUNITY): Payer: Self-pay | Admitting: Physician Assistant

## 2016-09-28 LAB — BASIC METABOLIC PANEL
ANION GAP: 7 (ref 5–15)
BUN: 9 mg/dL (ref 6–20)
CALCIUM: 7.6 mg/dL — AB (ref 8.9–10.3)
CHLORIDE: 109 mmol/L (ref 101–111)
CO2: 25 mmol/L (ref 22–32)
Creatinine, Ser: 0.97 mg/dL (ref 0.44–1.00)
GFR calc non Af Amer: 57 mL/min — ABNORMAL LOW (ref >60)
Glucose, Bld: 174 mg/dL — ABNORMAL HIGH (ref 65–99)
Potassium: 3.8 mmol/L (ref 3.5–5.1)
Sodium: 141 mmol/L (ref 135–145)

## 2016-09-28 LAB — CBC
HCT: 32.5 % — ABNORMAL LOW (ref 36.0–46.0)
HEMOGLOBIN: 10.4 g/dL — AB (ref 12.0–15.0)
MCH: 31.2 pg (ref 26.0–34.0)
MCHC: 32 g/dL (ref 30.0–36.0)
MCV: 97.6 fL (ref 78.0–100.0)
Platelets: 200 10*3/uL (ref 150–400)
RBC: 3.33 MIL/uL — AB (ref 3.87–5.11)
RDW: 18.8 % — ABNORMAL HIGH (ref 11.5–15.5)
WBC: 4.1 10*3/uL (ref 4.0–10.5)

## 2016-09-28 MED ORDER — PANTOPRAZOLE SODIUM 40 MG PO TBEC
40.0000 mg | DELAYED_RELEASE_TABLET | Freq: Two times a day (BID) | ORAL | Status: DC
  Administered 2016-09-28 – 2016-10-06 (×17): 40 mg via ORAL
  Filled 2016-09-28 (×19): qty 1

## 2016-09-28 MED ORDER — METOCLOPRAMIDE HCL 10 MG PO TABS: 10.0000 mg | ORAL_TABLET | Freq: Three times a day (TID) | ORAL | Status: DC | PRN

## 2016-09-28 NOTE — Progress Notes (Signed)
PROGRESS NOTE    Cathy Lewis  UDJ:[id number] DOB: 09/23/1945 DOA: 09/26/2016 PCP: Christain Sacramento, MD    Brief Narrative: Cathy Lewis is a 71 y.o. 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 Patient with frequents ED visit due to severe , uncontrolled pain. She report also poor oral intake. She is complaining of back pain, and abdominal pain. Denies vomiting, but she has not been able to eat. She has not had a BM in days. She has pass flatus. She is suppose to be taking antibiotics for UTI, antibiotics prescribe by PCP.  She denies dysuria, chest pain or dyspnea.   ED Course: Patient presents complaining of severe pain, leukopenia with WBC at 3.3, Hb at 11, K at 3.6, CO@ 20, UA with too numerous to count WBC. KUB non obstructive bowel pattern.     Assessment & Plan:   Active Problems:   Hypertension   Metastatic renal cell carcinoma to bone [facility])   Malignant neoplasm metastatic to bone (H[facility])   Carcinoma of kidney (HCC)   Chronic constipation   Abdominal pain   Uncontrolled pain   Acute lower UTI   Leukopenia due to antineoplastic chemotherapy [facility])  1-Metastatic Renal cell carcinoma, new Diagnosis of Gastric cancer; Severe uncontrolled pain, back and abdomen; Pain likely related to malignancy.  Appreciate Dr Domingo Cocking  help with pain management.  IV Protonix BID.  Continue with Carafate.   on PCA pump.  IV decadron.  Dr Marin Olp recommends celiac plexus block. IR consulted. Plan for Tuesday.   2-Constipation. Resolved.  Suppository ordered.  KUB negative for obstruction.  IV fluids.  She has had multiples bowel movement.   3-Poor oral Intake; IV fluids.  Advance diet  Pain controlled.    4-UTI; Patient was suppose to be taking antibiotics for UTI.  Last Urine  culture on July grew Pseudomonas.  Urine culture. Follow results 40,000 colonies gram negative rods.  Continue with cefepime day 3.   5-Leukopenia; in setting of chemo.  Treat UTI.  Monitor for fever.  resolved.     DVT prophylaxis: SCD.  Code Status: full code.  Family Communication: care discussed with patient  Disposition Plan: remain in the hospital for pain controlled.   Consultants:   Palliative care.   Dr Marin Olp.    Procedures:   none   Antimicrobials:   Cefepime 8-03   Subjective: Report improvement of abdominal pain.  Started to eat a little more.   Objective: Vitals:   09/28/16 0445 09/28/16 0502 09/28/16 0753 09/28/16 0900  BP:  136/83    Pulse:  89    Resp: 10 14 14    Temp:  98.4 F (36.9 C)    TempSrc:  Oral    SpO2: 100% 98% 100% 99%  Weight:      Height:        Intake/Output Summary (Last 24 hours) at 09/28/16 1220 Last data filed at 09/27/16 1800  Gross per 24 hour  Intake              900 ml  Output                0 ml  Net              900 ml   Filed Weights   09/26/16 1819 09/27/16 0628 09/28/16  0055  Weight: 53.9 kg (118 lb 13.3 oz) 53.6 kg (118 lb 4 oz) 51 kg (112 lb 7 oz)    Examination:  General exam: NAD Respiratory system: CTA, Normal respiratory effort.  Cardiovascular system: S 1, S 2 RRR Gastrointestinal system; Abdomen soft, mild tenderness.  Central nervous system: Alert, oriented, non focal.  Extremities: Symmetric power.  Skin: No rash, lesion     Data Reviewed: I have personally reviewed following labs and imaging studies  CBC:  Recent Labs Lab 09/22/16 0431 09/25/16 0914 09/26/16 1121 09/27/16 0407 09/28/16 0353  WBC 3.5* 3.8* 3.3* 2.9* 4.1  NEUTROABS  --  2.0  --   --   --   HGB 10.6* 12.8 11.1* 10.6* 10.4*  HCT 33.8* 38.5 34.2* 32.8* 32.5*  MCV 99.4 98 96.9 97.6 97.6  PLT 176 207 186 192 657   Basic Metabolic Panel:  Recent Labs Lab 09/22/16 0431 09/25/16 0914 09/26/16 1121  09/27/16 0407 09/28/16 0353  NA 139 134 136 138 141  K 4.0 4.0 3.6 4.1 3.8  CL 113* 103 105 108 109  CO2 17* 25 20* 20* 25  GLUCOSE 73 105 91 157* 174*  BUN 7 4* 7 9 9   CREATININE 1.05* 1.1 0.98 0.90 0.97  CALCIUM 7.2* 9.0 8.0* 7.8* 7.6*   GFR: Estimated Creatinine Clearance: 41.6 mL/min (by C-G formula based on SCr of 0.97 mg/dL). Liver Function Tests:  Recent Labs Lab 09/22/16 0431 09/25/16 0914 09/26/16 1121  AST 26 33 29  ALT 20 25 22   ALKPHOS 42 57 52  BILITOT 1.0 0.60 0.4  PROT 5.6* 6.7 6.1*  ALBUMIN 3.3* 3.7 3.5    Recent Labs Lab 09/26/16 1121  LIPASE 23   No results for input(s): AMMONIA in the last 168 hours. Coagulation Profile: No results for input(s): INR, PROTIME in the last 168 hours. Cardiac Enzymes: No results for input(s): CKTOTAL, CKMB, CKMBINDEX, TROPONINI in the last 168 hours. BNP (last 3 results) No results for input(s): PROBNP in the last 8760 hours. HbA1C: No results for input(s): HGBA1C in the last 72 hours. CBG: No results for input(s): GLUCAP in the last 168 hours. Lipid Profile: No results for input(s): CHOL, HDL, LDLCALC, TRIG, CHOLHDL, LDLDIRECT in the last 72 hours. Thyroid Function Tests: No results for input(s): TSH, T4TOTAL, FREET4, T3FREE, THYROIDAB in the last 72 hours. Anemia Panel: No results for input(s): VITAMINB12, FOLATE, FERRITIN, TIBC, IRON, RETICCTPCT in the last 72 hours. Sepsis Labs: No results for input(s): PROCALCITON, LATICACIDVEN in the last 168 hours.  Recent Results (from the past 240 hour(s))  Urine Culture     Status: Abnormal (Preliminary result)   Collection Time: 09/26/16  3:22 PM  Result Value Ref Range Status   Specimen Description URINE, RANDOM  Final   Special Requests NONE  Final   Culture 40,000 COLONIES/mL GRAM NEGATIVE RODS (A)  Final   Report Status PENDING  Incomplete         Radiology Studies: Dg Abd 1 View  Result Date: 09/26/2016 CLINICAL DATA:  71 y/o F; metastatic renal cell  carcinoma to the bone and recent EGD showing gastric adenocarcinoma. Patient complains of abdominal pain for about 3 weeks. EXAM: ABDOMEN - 1 VIEW COMPARISON:  09/25/2016 CT abdomen and pelvis. FINDINGS: Nonobstructive bowel gas pattern. There is retained contrast within the colon from prior CT of abdomen and pelvis. Multiple lucent lesions are present throughout the spine compatible with metastatic disease and there is a stable compression deformity of the T12  vertebral body post kyphoplasty with focal levocurvature. IMPRESSION: 1. Nonobstructive bowel gas pattern. Retained contrast within the colon from prior CT of abdomen and pelvis. 2. Multiple lytic lesions in the spine and stable compression deformity of T12 vertebral body post kyphoplasty with focal levocurvature. Electronically Signed   By: Kristine Garbe M.D.   On: 09/26/2016 16:22        Scheduled Meds: . amLODipine  5 mg Oral Daily  . dexamethasone  8 mg Intravenous Q24H  . fluticasone  1 spray Each Nare Daily  . HYDROmorphone   Intravenous Q4H  . mometasone-formoterol  2 puff Inhalation BID  . montelukast  10 mg Oral QHS  . pantoprazole  40 mg Oral BID  . senna  2 tablet Oral BID  . sucralfate  1 g Oral TID WC & HS   Continuous Infusions: . ceFEPime (MAXIPIME) IV Stopped (09/28/16 0545)  . dextrose 5 % and 0.9% NaCl 75 mL/hr at 09/28/16 0145     LOS: 2 days    Time spent: 35 minutes.     Elmarie Shiley, MD [facility] Pager [phone]  If 7PM-7AM, please contact night-coverage [email or web address] Password [email or web address] 09/28/2016, 12:20 PM

## 2016-09-28 NOTE — Progress Notes (Signed)
Daily Progress Note   Patient Name: Cathy Lewis       Date: 09/28/2016 DOB: [birth year]  Age: 71 y.o. MRN#: [id number] Attending Physician: Elmarie Shiley, MD Primary Care Physician: Christain Sacramento, MD Admit Date: 09/26/2016  Reason for Consultation/Follow-up: Establishing goals of care, Non pain symptom management and Pain control  Subjective: I saw and examined Cathy Lewis.  Her husband was also present today.    She reports that her pain is under much better control.  Had BM this morning as well.  Length of Stay: 2  Current Medications: Scheduled Meds:  . amLODipine  5 mg Oral Daily  . dexamethasone  8 mg Intravenous Q24H  . fluticasone  1 spray Each Nare Daily  . HYDROmorphone   Intravenous Q4H  . mometasone-formoterol  2 puff Inhalation BID  . montelukast  10 mg Oral QHS  . pantoprazole  40 mg Oral BID  . senna  2 tablet Oral BID  . sucralfate  1 g Oral TID WC & HS    Continuous Infusions: . ceFEPime (MAXIPIME) IV 1 g (09/28/16 1814)  . dextrose 5 % and 0.9% NaCl 75 mL/hr at 09/28/16 1600    PRN Meds: albuterol, bisacodyl, dicyclomine, diphenhydrAMINE **OR** diphenhydrAMINE, HYDROmorphone (DILAUDID) injection, metoCLOPramide, naloxone **AND** sodium chloride flush, ondansetron (ZOFRAN) IV  Physical Exam   General: Alert, awake, in no acute distress.  HEENT: No bruits, no goiter, no JVD Heart: Regular rate and rhythm. No murmur appreciated. Lungs: Good air movement, clear Abdomen: Soft, nontender, nondistended, positive bowel sounds.  Ext: No significant edema Skin: Warm and dry Neuro: Grossly intact, nonfocal.         Vital Signs: BP 117/61 (BP Location: Left Arm)   Pulse 95   Temp 98.7 F (37.1 C) (Oral)   Resp 14   Ht 5\' 4"  (1.626 m)   Wt 51 kg (112  lb 7 oz)   SpO2 97%   BMI 19.30 kg/m  SpO2: SpO2: 97 % O2 Device: O2 Device: Not Delivered O2 Flow Rate: O2 Flow Rate (L/min): 1 L/min  Intake/output summary:   Intake/Output Summary (Last 24 hours) at 09/28/16 1845 Last data filed at 09/28/16 1835  Gross per 24 hour  Intake              720 ml  Output              775 ml  Net              -55 ml   LBM: Last BM Date: 09/28/16 Baseline Weight: Weight: 53.5 kg (118 lb) Most recent weight: Weight: 51 kg (112 lb 7 oz)       Palliative Assessment/Data:      Patient Active Problem List   Diagnosis Date Noted  . Uncontrolled pain 09/26/2016  . Acute lower UTI 09/26/2016  . Leukopenia due to antineoplastic chemotherapy ([place]) 09/26/2016  . Chronic left-sided thoracic back pain   . Abdominal pain 09/21/2016  . Anemia 09/21/2016  . Abdominal pain, epigastric   . Chronic constipation   . Presbyopia of both eyes 03/17/2016  . GERD (gastroesophageal reflux disease) 10/09/2015  . Cough variant asthma 05/02/2015  . Metastatic renal cell carcinoma to bone ([place]) 11/20/2014  . Malignant neoplasm metastatic to bone ([place]) 08/29/2014  . H/O therapeutic radiation 08/29/2014  . Carcinoma of kidney ([place]) 08/29/2014  . LBP (low back pain) 08/29/2014  . Atypical chest pain   . Dyslipidemia   . Hypertension   . Syncope     Palliative Care Assessment & Plan   Patient Profile: 71 yo with metastatic Renal Cell Cancer, newly diagnosed Gastric adenocarcinoma followed and treated by Dr. Marin Lewis, she is on daily oral chemotherapy Cabometyx. She has been seen 5 times in th ED for uncontrolled pain and other symptoms in the setting of advanced cancer. She reports worsening symptoms for a month- severe pain in back, pain down her legs -difficulty getting comfortable in general. Not eating well. No BM in 4 days, feels full only passing bits of stool, but no vomiting. Was sent home on low dose oral dilaudid- has not had adequate pain relief.   Palliative consulted for symptom management and to begin discussion on goals of care.  Recommendations/Plan:  Pain: Reports pain currently well controlled.  She has used 3.5mg  of IV dilaudid in the last 24 hours.  As plan for celiac plexus block on Tuesday, plan to continue PCA with current settings.  I still believe improvement in pain is likely partially due to due to addition of steroids and also resolution of constipation.  Will hold on restarting patch one more day and make decision on patch strength tomorrow based on PCA data.  Constipation: Reports another BM this AM.  Senna 2 tabs BID and escalate bowel regimen as necessary.  She reports that she was using miralax at home without good response.  Goals of care: Continued discussion deferred per patient desire to "process everything" and discuss further with oncology.  Goals of Care and Additional Recommendations:  Limitations on Scope of Treatment: Full Scope Treatment  Code Status:    Code Status Orders        Start     Ordered   09/26/16 1934  Full code  Continuous     09/26/16 1933    Code Status History    Date Active Date Inactive Code Status Order ID Comments User Context   09/21/2016  4:29 PM 09/22/2016  8:51 PM Full Code [id number]  Jani Gravel, MD ED   03/11/2011  5:56 PM 03/14/2011  3:47 PM Full Code [id number]  Iantha Fallen, RN Inpatient       Prognosis:   Unable to determine  Discharge Planning:  To Be Determined  Care plan was discussed with patient  Thank you for allowing the Palliative  Medicine Team to assist in the care of this patient.  Total time: 30 minutes Greater than 50%  of this time was spent counseling and coordinating care related to the above assessment and plan.  Micheline Rough, MD  Please contact Palliative Medicine Team phone at [phone] for questions and concerns.

## 2016-09-28 NOTE — H&P (Signed)
Chief Complaint: Abdominal pain  Referring Physician(s): Volanda Napoleon  Supervising Physician: Markus Daft  Patient Status: [facility] - In-pt  History of Present Illness: Cathy Lewis is a 71 y.o. 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   Biopsy done on 7/30 pathology revealed adenocarcinoma.  She is currently on Chemotherapy.  CT scan 7/27 showed  pulmonary,bone, liver and adrenal metastasis.   She has frequent ED visit due to severe, uncontrolled pain.   She complains of abdominal pain that goes through to her back.   We are asked to evaluate her for celiac block for pain control.  Past Medical History:  Diagnosis Date  . Arthritis    arthritis in knees and back   . Asthma   . Atypical chest pain    NORMAL CARDIOLITE STUDY   . Complication of anesthesia    hard to wake up from umb hernia-was given alot of meds  . Depression   . Dyslipidemia   . GERD (gastroesophageal reflux disease)   . Headache   . Hypertension   . Metastatic renal cell carcinoma to bone ([place]) 11/20/2014  . Seasonal allergies   . Syncope    HISTORY OF    Past Surgical History:  Procedure Laterality Date  . ABDOMINAL HYSTERECTOMY  2004   rectocele  . APPENDECTOMY    . BUNIONECTOMY Left 01-11-2013   left foot  . BUNIONECTOMY Right 12-31-2006   with hammer toe  . CARPAL TUNNEL RELEASE     R  . CHOLECYSTECTOMY N/A 08/27/2015   Procedure: LAPAROSCOPIC CHOLECYSTECTOMY WITH INTRAOPERATIVE CHOLANGIOGRAM;  Surgeon: [name], MD;  Location: [place];  Service: General;  Laterality: N/A;  . COLONOSCOPY    . CYSTOCELE REPAIR    . DILATION AND CURETTAGE OF UTERUS    . ESOPHAGOGASTRODUODENOSCOPY N/A 09/22/2016   Procedure: ESOPHAGOGASTRODUODENOSCOPY (EGD);  Surgeon: Mauri Pole, MD;  Location: [facility];  Service: Endoscopy;  Laterality: N/A;  . HERNIA REPAIR  2014    umb, right and left  . INGUINAL HERNIA  REPAIR Left 01/03/2014   Procedure: LEFT INGUINAL HERNIA REPAIR ;  Surgeon: [name], MD;  Location: [place];  Service: General;  Laterality: Left;  . INGUINAL HERNIA REPAIR Right 04/06/2015   Procedure: HERNIA REPAIR RIGHT  INGUINAL ADULT WITH MESH;  Surgeon: [name], MD;  Location: [facility];  Service: General;  Laterality: Right;  . INSERTION OF MESH Left 01/03/2014   Procedure: INSERTION OF MESH;  Surgeon: [name], MD;  Location: [place];  Service: General;  Laterality: Left;  . INSERTION OF MESH Right 04/06/2015   Procedure: INSERTION OF MESH;  Surgeon: [name], MD;  Location: [facility];  Service: General;  Laterality: Right;  . IR GENERIC HISTORICAL  07/03/2015   IR RADIOLOGIST EVAL & MGMT 07/03/2015 Marybelle Killings, MD [facility] INTERV RAD  . IR GENERIC HISTORICAL  05/24/2015   IR RADIOLOGIST EVAL & MGMT 05/24/2015 Marybelle Killings, MD [facility] INTERV RAD  . JOINT REPLACEMENT    . 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 OTHER SURGICAL HISTORY     bladder vault prolapse  . OTHER SURGICAL HISTORY     right foot bunion and hammer toe surgery   . OTHER SURGICAL HISTORY     right knee arthroscopic   . OTHER SURGICAL HISTORY     right arm ( underarm ) surgery removed ? cyst   .  TOTAL KNEE ARTHROPLASTY  03/11/2011   Procedure: TOTAL KNEE ARTHROPLASTY;  Surgeon: Cynda Familia, MD;  Location: [facility];  Service: Orthopedics;  Laterality: Right;  . TUBAL LIGATION     1977    Allergies: Azithromycin; Ciprofloxacin; Etodolac; Gnp [acetaminophen]; Moxifloxacin; Tramadol; and Rofecoxib  Medications: Prior to Admission medications   Medication Sig Start Date End Date Taking? Authorizing Provider  albuterol (PROVENTIL,VENTOLIN) 90 MCG/ACT inhaler Inhale 2 puffs into the lungs every 4 (four) hours as needed for wheezing.    Yes [provider]  amLODipine (NORVASC) 5 MG tablet Take 5 mg by mouth daily.  07/28/15  Yes [provider]  CABOMETYX 40 MG TABS TAKE 1 TABLET BY MOUTH DAILY Patient taking differently: TAKE 1 TABLET BY MOUTH EVERY OTHER DAY 03/31/16  Yes Ennever, Rudell Cobb, MD  Calcium Carbonate-Vitamin D (CALCIUM + D PO) Take 1 tablet by mouth daily.    Yes [provider]  cyclobenzaprine (FLEXERIL) 5 MG tablet Take 1 tablet (5 mg total) by mouth 3 (three) times daily as needed for muscle spasms. 09/23/16  Yes Ennever, Rudell Cobb, MD  denosumab (XGEVA) 120 MG/1.7ML SOLN injection Inject 120 mg into the skin every 30 (thirty) days.   Yes [provider]  dicyclomine (BENTYL) 20 MG tablet Take 20 mg by mouth 3 (three) times daily as needed (Stomach Cramps).   Yes [provider]  fentaNYL (DURAGESIC - DOSED MCG/HR) 12 MCG/HR Place 12.5 mcg onto the skin every 3 (three) days.   Yes [provider]  fluticasone (FLONASE) 50 MCG/ACT nasal spray Place 1 spray into both nostrils daily.   Yes [provider]  Fluticasone-Salmeterol (ADVAIR DISKUS) 100-50 MCG/DOSE AEPB Inhale 1 puff into the lungs 2 (two) times daily before a meal.   Yes [provider]  HYDROcodone-acetaminophen (NORCO) 5-325 MG tablet Take 1 tablet by mouth every 6 (six) hours as needed. 03/01/16  Yes Orlie Dakin, MD  HYDROmorphone (DILAUDID) 2 MG tablet Take 1 tablet (2 mg total) by mouth every 4 (four) hours as needed for severe pain. 09/25/16  Yes McDonald, Mia A, PA-C  Magnesium 250 MG TABS Take 250 mg by mouth daily.   Yes [provider]  metoCLOPramide (REGLAN) 10 MG tablet Take 1 tablet (10 mg total) by mouth every 8 (eight) hours as needed for nausea or vomiting. 09/13/16  Yes Duffy Bruce, MD  Misc Natural Products (OSTEO BI-FLEX ADV DOUBLE ST PO) Take 1 tablet by mouth daily.   Yes [provider]  montelukast (SINGULAIR) 10 MG tablet Take 10 mg by mouth at bedtime.    Yes [provider]  pantoprazole (PROTONIX) 40 MG tablet Take 1 tablet (40 mg total) by  mouth 2 (two) times daily before a meal. 09/22/16  Yes Gherghe, Vella Redhead, MD  predniSONE (DELTASONE) 5 MG tablet Take 1 tablet (5 mg total) by mouth daily with breakfast. 03/31/16  Yes [place], Holli Humbles, NP  Probiotic Product (PROBIOTIC DAILY PO) Take 1 tablet by mouth daily.    Yes [provider]  promethazine (PHENERGAN) 12.5 MG tablet Take 1 tablet (12.5 mg total) by mouth every 6 (six) hours as needed for nausea or vomiting. 09/03/16  Yes Ennever, Rudell Cobb, MD  sucralfate (CARAFATE) 1 GM/10ML suspension Take 10 mLs (1 g total) by mouth 4 (four) times daily -  with meals and at bedtime. 09/13/16  Yes Duffy Bruce, MD  vitamin C (ASCORBIC ACID) 500 MG tablet Take 500 mg by mouth  daily.   Yes [provider]  nystatin (MYCOSTATIN) 100000 UNIT/ML suspension Take 5 mLs (500,000 Units total) by mouth 4 (four) times daily. Patient not taking: Reported on 09/21/2016 09/25/15   [place], Holli Humbles, NP  pilocarpine (SALAGEN) 5 MG tablet Take 1 tablet (5 mg total) by mouth 2 (two) times daily. Patient not taking: Reported on 09/21/2016 10/02/15   [place], Holli Humbles, NP  ranitidine (ZANTAC) 150 MG tablet Take 1 tablet (150 mg total) by mouth 2 (two) times daily. Patient not taking: Reported on 09/26/2016 09/13/16 09/23/16  Duffy Bruce, MD     Family History  Problem Relation Age of Onset  . Heart attack Father        DECEASED  . Cancer Mother        DECEASED-unsure of type  . Cancer Brother        LUNG CA- smoked  . Aneurysm Brother        AAA  . Asthma Sister     Social History   Social History  . Marital status: Married    Spouse name: N/A  . Number of children: N/A  . Years of education: N/A   Social History Main Topics  . Smoking status: Former Smoker    Packs/day: 0.25    Years: 10.00    Quit date: 02/24/1985  . Smokeless tobacco: Never Used  . Alcohol use No  . Drug use: No  . Sexual activity: Not Asked   Other Topics Concern  . None   Social History Narrative   . None    Review of Systems: A 12 point ROS discussed  Review of Systems  Constitutional: Positive for activity change and fatigue.  HENT: Negative.   Respiratory: Negative.   Cardiovascular: Negative.   Gastrointestinal: Positive for abdominal pain.  Genitourinary: Negative.   Musculoskeletal: Positive for back pain.  Skin: Negative.   Neurological: Negative.   Hematological: Negative.   Psychiatric/Behavioral: Negative.     Vital Signs: BP 136/83 (BP Location: Left Arm)   Pulse 89   Temp 98.4 F (36.9 C) (Oral)   Resp 14   Ht 5\' 4"  (1.626 m)   Wt 112 lb 7 oz (51 kg)   SpO2 99%   BMI 19.30 kg/m   Physical Exam  Constitutional: She is oriented to person, place, and time. She appears well-developed.  HENT:  Head: Normocephalic and atraumatic.  Eyes: EOM are normal.  Neck: Normal range of motion.  Cardiovascular: Normal rate and regular rhythm.   Pulmonary/Chest: Effort normal. No respiratory distress.  Abdominal: Soft. There is tenderness.  Musculoskeletal: Normal range of motion.  Neurological: She is alert and oriented to person, place, and time.  Skin: Skin is warm and dry.  Psychiatric: She has a normal mood and affect. Her behavior is normal. Judgment and thought content normal.  Vitals reviewed.   Mallampati Score:  MD Evaluation Airway: WNL Heart: WNL Abdomen: WNL Chest/ Lungs: WNL ASA  Classification: 3 Mallampati/Airway Score: Two  Imaging: Ct Abdomen Pelvis Wo Contrast  Result Date: 09/13/2016 CLINICAL DATA:  Abdominal pain and bloating 1-2 weeks. History of metastatic renal cell carcinoma. EXAM: CT ABDOMEN AND PELVIS WITHOUT CONTRAST TECHNIQUE: Multidetector CT imaging of the abdomen and pelvis was performed following the standard protocol without IV contrast. COMPARISON:  09/11/2016 FINDINGS: Lower chest: Multiple small nodules over the lung bases unchanged compatible known metastatic disease. Small hiatal hernia. Hepatobiliary: Previous  cholecystectomy. Stable hypodense mass over the far lateral aspect of the left lobe  of the liver abutting the lesser curvature of the stomach. Pancreas: Within normal. Spleen: Within normal. Adrenals/Urinary Tract: Bilateral renal masses unchanged compatible with metastatic disease. Evidence of previous left nephrectomy. Right kidney is normal in size without hydronephrosis or nephrolithiasis. Suggestion of a couple small right renal cysts unchanged. Right ureter and bladder are normal. Stomach/Bowel: Small hiatal hernia. Small bowel is within normal. Previous appendectomy. Colon is within normal. Vascular/Lymphatic: Mild-to-moderate calcified plaque over the abdominal aorta and iliac arteries. Few small periaortic lymph nodes unchanged. Reproductive: Previous hysterectomy.  Ovaries not visualized. Other: No free fluid or inflammatory change. Musculoskeletal: Known stable metastatic disease throughout the bony structures predominately including the spine and sternum. Stable T12 compression fracture post kyphoplasty. IMPRESSION: No evidence of bowel obstruction. No acute findings in the abdomen/pelvis. No significant change in patient's known metastatic renal cell carcinoma with involvement of lung, liver, adrenal glands and bones. Stable moderate pathologic T12 compression fracture post kyphoplasty. Few small bilateral renal cysts unchanged. Aortic Atherosclerosis (ICD10-I70.0). Electronically Signed   By: Marin Olp M.D.   On: 09/13/2016 16:18   Dg Thoracic Spine 2 View  Result Date: 09/21/2016 CLINICAL DATA:  Chronic back pain. Abdominal and epigastric pain for several weeks. No known injury. History of metastatic renal cell carcinoma per previous radiology reports. EXAM: THORACIC SPINE 2 VIEWS COMPARISON:  Chest CT and whole body bone scan 09/11/2016. FINDINGS: No visible ribs at T12. There is a stable chronic fracture at T12 with asymmetric loss of vertebral body height on the right and a resulting convex  left scoliosis. Spinal augmentation has been performed at this level. Scattered small lytic lesions seen on CT are not discernible. No evidence of pathologic fracture or widening of the interpedicular distance. Lower cervical spondylosis noted. IMPRESSION: Stable radiographic appearance of the thoracic spine status post spinal augmentation for metastatic disease at T12. No acute findings seen. Electronically Signed   By: Richardean Sale M.D.   On: 09/21/2016 13:38   Dg Abd 1 View  Result Date: 09/26/2016 CLINICAL DATA:  71 y/o F; metastatic renal cell carcinoma to the bone and recent EGD showing gastric adenocarcinoma. Patient complains of abdominal pain for about 3 weeks. EXAM: ABDOMEN - 1 VIEW COMPARISON:  09/25/2016 CT abdomen and pelvis. FINDINGS: Nonobstructive bowel gas pattern. There is retained contrast within the colon from prior CT of abdomen and pelvis. Multiple lucent lesions are present throughout the spine compatible with metastatic disease and there is a stable compression deformity of the T12 vertebral body post kyphoplasty with focal levocurvature. IMPRESSION: 1. Nonobstructive bowel gas pattern. Retained contrast within the colon from prior CT of abdomen and pelvis. 2. Multiple lytic lesions in the spine and stable compression deformity of T12 vertebral body post kyphoplasty with focal levocurvature. Electronically Signed   By: Kristine Garbe M.D.   On: 09/26/2016 16:22   Ct Abdomen Pelvis W Contrast  Result Date: 09/25/2016 CLINICAL DATA:  Abdominal pain for the past 2.5 weeks. Previous cholecystectomy, appendectomy and inguinal hernia repair. History of renal cell carcinoma metastatic to the bone diagnosed in 2016. EXAM: CT ABDOMEN AND PELVIS WITH CONTRAST TECHNIQUE: Multidetector CT imaging of the abdomen and pelvis was performed using the standard protocol following bolus administration of intravenous contrast. CONTRAST:  159mL ISOVUE-300 IOPAMIDOL (ISOVUE-300) INJECTION 61%  COMPARISON:  Abdomen radiographs obtained earlier today. Abdomen and pelvis CT dated 09/19/2016 and chest CT dated 09/11/2016. FINDINGS: Lower chest: Multiple lung nodules are again demonstrated at the lung bases. These demonstrate a mild increase in  size and number. The largest is at the right lateral lung base, measuring 1.4 x 1.3 cm in maximum diameter on image number 1 of series 4 today. This measured 1.1 x 0.9 cm on 09/19/2016 and 1.1 x 0.9 cm on 09/11/2016. Hepatobiliary: A 80 lobulated, heterogeneously enhancing mass in the lateral segment of the left lobe of the liver is similar to the previous examinations, measuring 2.6 x 2.2 cm on coronal image number 40. A tiny probable cyst in the right lobe of the liver has not changed significantly. Cholecystectomy clips. Pancreas: Stable atrophy of the head, neck, body and proximal tail and mild atrophy of the distal tail. Spleen: Normal in size without focal abnormality. Adrenals/Urinary Tract: Little change in the heterogeneously enhancing mass in the mid right kidney. This measures 1.2 cm in AP diameter on image number 26 of series 2 and 1.6 x 1.3 cm on coronal image number 66. This previously measured 1.3 cm in maximum diameter. Multiple right renal cysts are also again demonstrated. Surgically absent left kidney. A heterogeneously enhancing left adrenal mass is again demonstrated, measuring 3.5 x 2.5 cm on image number 20 of series 2. This measured 3.0 x 2.0 cm on 09/11/2016. There is also a mildly heterogeneous right adrenal mass measuring 1.5 x 1.5 cm on image number 21 of series 2. This measured 1.7 x 1.1 cm on 09/11/2016. An additional 1.2 x 0.9 cm heterogeneously enhancing right adrenal mass on image number 18 of series 2 measured 1.0 x 0.8 cm on 09/11/2016. Unremarkable urinary bladder and right ureter. Stomach/Bowel: Increased caliber of the cecum, right colon and transverse colon, containing oral contrast without wall thickening, stricture or mass.  Unremarkable stomach and small bowel. Vascular/Lymphatic: Atheromatous arterial calcifications without aneurysm. Mild increase in size and confluence of retroperitoneal lymph nodes. The largest is an aortocaval node with a short axis diameter of 9 mm on image number 29 of series 2. Reproductive: Status post hysterectomy. No adnexal masses. Other: No abdominal wall hernia or abnormality. No abdominopelvic ascites. Musculoskeletal: Multiple lytic and sclerotic spine metastases without significant change. Stable sclerosis in collapse of the T12 vertebral body with kyphoplasty material. IMPRESSION: 1. No acute abnormality. There has been increase in caliber of the cecum, right colon and transverse colon without evidence of obstruction. 2. Mildly progressive metastatic disease at the lung bases. 3. Stable probable metastasis in the lateral segment of the left lobe of the liver. 4. 1.6 cm mid right renal mass with a mild increase in size, suspicious for a primary right renal neoplasm or metastasis. 5. 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 6. Minimally progressive retroperitoneal adenopathy, most likely metastatic. 7. No significant change in extensive spinal metastatic disease. Electronically Signed   By: Claudie Revering M.D.   On: 09/25/2016 18:34   Ct Abdomen Pelvis W Contrast  Result Date: 09/19/2016 CLINICAL DATA:  Epigastric abdominal pain for 3 weeks. EXAM: CT ABDOMEN AND PELVIS WITH CONTRAST TECHNIQUE: Multidetector CT imaging of the abdomen and pelvis was performed using the standard protocol following bolus administration of intravenous contrast. CONTRAST:  90mL ISOVUE-300 IOPAMIDOL (ISOVUE-300) INJECTION 61% COMPARISON:  CT scan of September 13, 2016. FINDINGS: Lower chest: Multiple pulmonary metastases are seen in both lung bases. Hepatobiliary: Status post cholecystectomy. Stable left hepatic mass seen consistent with metastatic disease. Pancreas: Unremarkable. No pancreatic ductal  dilatation or surrounding inflammatory changes. Spleen: Normal in size without focal abnormality. Adrenals/Urinary Tract: Stable bilateral adrenal masses are noted concerning for metastatic disease, left greater than  right. Status post left nephrectomy. No hydronephrosis or renal obstruction is seen involving the right kidney. Multiple cysts are noted. 1.3 cm partially enhancing solid mass is seen in midpole cortex of right kidney concerning for malignancy. Urinary bladder is decompressed. Stomach/Bowel: The stomach is unremarkable. Status post appendectomy. There is no evidence of bowel obstruction or inflammation. Vascular/Lymphatic: Aortic atherosclerosis. No enlarged abdominal or pelvic lymph nodes. Reproductive: Status post hysterectomy. No adnexal masses. Other: No abdominal wall hernia or abnormality. No abdominopelvic ascites. Musculoskeletal: Multiple metastatic lesions are noted in the spine with stable pathologic fracture of T12 vertebral body. IMPRESSION: Continued presence of pulmonary, osseous, left hepatic and adrenal metastases. Aortic atherosclerosis. Status post left nephrectomy. Multiple cysts are noted in the right kidney, although there is a 1.3 cm partially enhancing solid mass seen in midpole cortex of right kidney concerning for possible malignancy. MRI may be performed further evaluation. Electronically Signed   By: Marijo Conception, M.D.   On: 09/19/2016 14:52   Dg Abd 2 Views  Result Date: 09/25/2016 CLINICAL DATA:  Abdominal pain with bloating. Known metastatic renal carcinoma EXAM: ABDOMEN - 2 VIEW COMPARISON:  CT abdomen and pelvis September 19, 2016 FINDINGS: Supine and upright images were obtained. There are loops of dilated bowel with multiple air-fluid levels. No free air appreciable. There are surgical clips in the right upper abdomen. There is thoracolumbar levoscoliosis, stable. There is evidence of fracture at T12 with metastatic disease in this area. IMPRESSION: Findings felt to  be indicative of a degree of bowel obstruction. No free air. Fracture with bony metastasis at T12 again noted. There is thoracolumbar levoscoliosis. These results will be called to the ordering clinician or representative by the Radiologist Assistant, and communication documented in the PACS or zVision Dashboard. Electronically Signed   By: Lowella Grip III M.D.   On: 09/25/2016 13:16    Labs:  CBC:  Recent Labs  09/25/16 0914 09/26/16 1121 09/27/16 0407 09/28/16 0353  WBC 3.8* 3.3* 2.9* 4.1  HGB 12.8 11.1* 10.6* 10.4*  HCT 38.5 34.2* 32.8* 32.5*  PLT 207 186 192 200    COAGS: No results for input(s): INR, APTT in the last 8760 hours.  BMP:  Recent Labs  09/22/16 0431 09/25/16 0914 09/26/16 1121 09/27/16 0407 09/28/16 0353  NA 139 134 136 138 141  K 4.0 4.0 3.6 4.1 3.8  CL 113* 103 105 108 109  CO2 17* 25 20* 20* 25  GLUCOSE 73 105 91 157* 174*  BUN 7 4* 7 9 9   CALCIUM 7.2* 9.0 8.0* 7.8* 7.6*  CREATININE 1.05* 1.1 0.98 0.90 0.97  GFRNONAA 51*  --  56* >60 57*  GFRAA 60*  --  >60 >60 >60    LIVER FUNCTION TESTS:  Recent Labs  09/21/16 1141 09/22/16 0431 09/25/16 0914 09/26/16 1121  BILITOT 0.7 1.0 0.60 0.4  AST 28 26 33 29  ALT 21 20 25 22   ALKPHOS 46 42 57 52  PROT 6.1* 5.6* 6.7 6.1*  ALBUMIN 3.6 3.3* 3.7 3.5    TUMOR MARKERS: No results for input(s): AFPTM, CEA, CA199, CHROMGRNA in the last 8760 hours.  Assessment and Plan:  Metastatic renal cell cancer to bone and newly diagnosed gastric cancer.   Severe abdominal pain with radiation through to her back.  Will proceed with celiac plexus block on Tuesday by Dr. Vernard Gambles.  Risks and benefits discussed with the patient including bleeding, infection, damage to adjacent structures.  All of the patient's questions were answered,  patient is agreeable to proceed. Consent signed and in chart.  Thank you for this interesting consult.  I greatly enjoyed meeting MONEKA MCQUINN and look forward to  participating in their care.  A copy of this report was sent to the requesting provider on this date.  Electronically Signed: Murrell Redden, PA-C 09/28/2016, 10:12 AM   I spent a total of 20 Minutes  in face to face in clinical consultation, greater than 50% of which was counseling/coordinating care for Celiac plexus block.       h

## 2016-09-29 DIAGNOSIS — N390 Urinary tract infection, site not specified: Secondary | ICD-10-CM

## 2016-09-29 DIAGNOSIS — C649 Malignant neoplasm of unspecified kidney, except renal pelvis: Secondary | ICD-10-CM

## 2016-09-29 DIAGNOSIS — K253 Acute gastric ulcer without hemorrhage or perforation: Secondary | ICD-10-CM

## 2016-09-29 DIAGNOSIS — B965 Pseudomonas (aeruginosa) (mallei) (pseudomallei) as the cause of diseases classified elsewhere: Secondary | ICD-10-CM

## 2016-09-29 DIAGNOSIS — R1013 Epigastric pain: Secondary | ICD-10-CM

## 2016-09-29 LAB — CBC
HEMATOCRIT: 32.6 % — AB (ref 36.0–46.0)
HEMOGLOBIN: 10.4 g/dL — AB (ref 12.0–15.0)
MCH: 31.7 pg (ref 26.0–34.0)
MCHC: 31.9 g/dL (ref 30.0–36.0)
MCV: 99.4 fL (ref 78.0–100.0)
Platelets: 228 10*3/uL (ref 150–400)
RBC: 3.28 MIL/uL — AB (ref 3.87–5.11)
RDW: 19 % — ABNORMAL HIGH (ref 11.5–15.5)
WBC: 5.3 10*3/uL (ref 4.0–10.5)

## 2016-09-29 LAB — BASIC METABOLIC PANEL
Anion gap: 9 (ref 5–15)
BUN: 9 mg/dL (ref 6–20)
CHLORIDE: 106 mmol/L (ref 101–111)
CO2: 27 mmol/L (ref 22–32)
Calcium: 8.1 mg/dL — ABNORMAL LOW (ref 8.9–10.3)
Creatinine, Ser: 0.9 mg/dL (ref 0.44–1.00)
GFR calc Af Amer: >60 mL/min (ref >60)
GFR calc non Af Amer: >60 mL/min (ref >60)
GLUCOSE: 134 mg/dL — AB (ref 65–99)
POTASSIUM: 4.7 mmol/L (ref 3.5–5.1)
Sodium: 142 mmol/L (ref 135–145)

## 2016-09-29 LAB — URINE CULTURE

## 2016-09-29 MED ORDER — FENTANYL 12 MCG/HR TD PT72
12.5000 ug | MEDICATED_PATCH | TRANSDERMAL | Status: DC
  Administered 2016-09-29: 12.5 ug via TRANSDERMAL
  Filled 2016-09-29: qty 1

## 2016-09-29 NOTE — Progress Notes (Signed)
PROGRESS NOTE    Cathy Lewis  ZOX:[id number] DOB: Jul 19, 1945 DOA: 09/26/2016 PCP: Christain Sacramento, MD    Brief Narrative: Cathy Lewis is a 71 y.o. 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 Patient with frequents ED visit due to severe , uncontrolled pain. She report also poor oral intake. She is complaining of back pain, and abdominal pain. Denies vomiting, but she has not been able to eat. She has not had a BM in days. She has pass flatus. She is suppose to be taking antibiotics for UTI, antibiotics prescribe by PCP.  She denies dysuria, chest pain or dyspnea.   ED Course: Patient presents complaining of severe pain, leukopenia with WBC at 3.3, Hb at 11, K at 3.6, CO2; 20, UA with too numerous to count WBC. KUB non obstructive bowel pattern.     Assessment & Plan:   Active Problems:   Hypertension   Metastatic renal cell carcinoma to bone [facility])   Malignant neoplasm metastatic to bone (HCC)   Carcinoma of kidney (HCC)   Chronic constipation   Abdominal pain   Uncontrolled pain   Acute lower UTI   Leukopenia due to antineoplastic chemotherapy [facility])  1-Metastatic Renal cell carcinoma, new Diagnosis of Gastric cancer; ? Vs metastasis to stomach , Severe uncontrolled pain, back and abdomen; Pain likely related to malignancy.  Appreciate Dr Domingo Cocking  help with pain management.  IV Protonix BID.  Continue with Carafate.  on PCA pump. Palliative helping with pain management.  IV decadron.  Dr Marin Olp recommends celiac plexus block. IR consulted. Plan for Tuesday.  GI consulted for repeat endoscopy, gastric biopsy.   2-Constipation. Resolved.  Suppository ordered.  KUB negative for obstruction.  IV fluids.  Having BM   3-Poor oral Intake;  IV fluids.  Tolerating  regular diet  Pain controlled.  She has been eating more.   4-UTI; Patient was suppose to be taking antibiotics for UTI.  Last Urine culture on July grew Pseudomonas.  Urine culture. Follow results 40,000 colonies gram negative rods.  Continue with cefepime day 4/5.   5-Leukopenia; in setting of chemo.  Treat UTI.  Monitor for fever.  resolved.     DVT prophylaxis: SCD.  Code Status: full code.  Family Communication: care discussed with patient  Disposition Plan: remain in the hospital for pain controlled.   Consultants:   Palliative care.   Dr Marin Olp.    Procedures:   none   Antimicrobials:   Cefepime 8-03   Subjective: Report improvement of abdominal pain.  She has been eating more.    Objective: Vitals:   09/29/16 0716 09/29/16 0855 09/29/16 1051 09/29/16 1054  BP:   129/64 129/64  Pulse: 75     Resp: 16 18    Temp:      TempSrc:      SpO2: 100% 100%    Weight:      Height:        Intake/Output Summary (Last 24 hours) at 09/29/16 1255 Last data filed at 09/29/16 1200  Gross per 24 hour  Intake              240 ml  Output             1575 ml  Net            -  1335 ml   Filed Weights   09/28/16 0055 09/29/16 0031 09/29/16 0439  Weight: 51 kg (112 lb 7 oz) 53 kg (116 lb 14.4 oz) 52.6 kg (116 lb)    Examination:  General exam: NAD Respiratory system: CTA, normal respiratory effort.  Cardiovascular system: S 1, S 2 RRR Gastrointestinal system; Abdomen, soft, nt Central nervous system: Non focal.  Extremities: Symmetric power.  Skin: No rash, lesion     Data Reviewed: I have personally reviewed following labs and imaging studies  CBC:  Recent Labs Lab 09/25/16 0914 09/26/16 1121 09/27/16 0407 09/28/16 0353 09/29/16 0421  WBC 3.8* 3.3* 2.9* 4.1 5.3  NEUTROABS 2.0  --   --   --   --   HGB 12.8 11.1* 10.6* 10.4* 10.4*  HCT 38.5 34.2* 32.8* 32.5* 32.6*  MCV 98 96.9 97.6 97.6 99.4  PLT 207 186 192 200 425   Basic Metabolic  Panel:  Recent Labs Lab 09/25/16 0914 09/26/16 1121 09/27/16 0407 09/28/16 0353 09/29/16 0421  NA 134 136 138 141 142  K 4.0 3.6 4.1 3.8 4.7  CL 103 105 108 109 106  CO2 25 20* 20* 25 27  GLUCOSE 105 91 157* 174* 134*  BUN 4* 7 9 9 9   CREATININE 1.1 0.98 0.90 0.97 0.90  CALCIUM 9.0 8.0* 7.8* 7.6* 8.1*   GFR: Estimated Creatinine Clearance: 46.2 mL/min (by C-G formula based on SCr of 0.9 mg/dL). Liver Function Tests:  Recent Labs Lab 09/25/16 0914 09/26/16 1121  AST 33 29  ALT 25 22  ALKPHOS 57 52  BILITOT 0.60 0.4  PROT 6.7 6.1*  ALBUMIN 3.7 3.5    Recent Labs Lab 09/26/16 1121  LIPASE 23   No results for input(s): AMMONIA in the last 168 hours. Coagulation Profile: No results for input(s): INR, PROTIME in the last 168 hours. Cardiac Enzymes: No results for input(s): CKTOTAL, CKMB, CKMBINDEX, TROPONINI in the last 168 hours. BNP (last 3 results) No results for input(s): PROBNP in the last 8760 hours. HbA1C: No results for input(s): HGBA1C in the last 72 hours. CBG: No results for input(s): GLUCAP in the last 168 hours. Lipid Profile: No results for input(s): CHOL, HDL, LDLCALC, TRIG, CHOLHDL, LDLDIRECT in the last 72 hours. Thyroid Function Tests: No results for input(s): TSH, T4TOTAL, FREET4, T3FREE, THYROIDAB in the last 72 hours. Anemia Panel: No results for input(s): VITAMINB12, FOLATE, FERRITIN, TIBC, IRON, RETICCTPCT in the last 72 hours. Sepsis Labs: No results for input(s): PROCALCITON, LATICACIDVEN in the last 168 hours.  Recent Results (from the past 240 hour(s))  Urine Culture     Status: Abnormal   Collection Time: 09/26/16  3:22 PM  Result Value Ref Range Status   Specimen Description URINE, RANDOM  Final   Special Requests NONE  Final   Culture 40,000 COLONIES/mL PSEUDOMONAS AERUGINOSA (A)  Final   Report Status 09/29/2016 FINAL  Final   Organism ID, Bacteria PSEUDOMONAS AERUGINOSA (A)  Final      Susceptibility   Pseudomonas  aeruginosa - MIC*    CEFTAZIDIME 4 SENSITIVE Sensitive     CIPROFLOXACIN <=0.25 SENSITIVE Sensitive     GENTAMICIN <=1 SENSITIVE Sensitive     IMIPENEM 1 SENSITIVE Sensitive     PIP/TAZO <=4 SENSITIVE Sensitive     CEFEPIME <=1 SENSITIVE Sensitive     * 40,000 COLONIES/mL PSEUDOMONAS AERUGINOSA         Radiology Studies: No results found.      Scheduled Meds: . amLODipine  5  mg Oral Daily  . dexamethasone  8 mg Intravenous Q24H  . fluticasone  1 spray Each Nare Daily  . HYDROmorphone   Intravenous Q4H  . mometasone-formoterol  2 puff Inhalation BID  . montelukast  10 mg Oral QHS  . pantoprazole  40 mg Oral BID  . senna  2 tablet Oral BID  . sucralfate  1 g Oral TID WC & HS   Continuous Infusions: . ceFEPime (MAXIPIME) IV Stopped (09/29/16 0630)  . dextrose 5 % and 0.9% NaCl 75 mL/hr at 09/28/16 1600     LOS: 3 days    Time spent: 35 minutes.     Elmarie Shiley, MD [facility] Pager [phone]  If 7PM-7AM, please contact night-coverage [email or web address] Password [email or web address] 09/29/2016, 12:55 PM

## 2016-09-29 NOTE — Progress Notes (Signed)
Cathy Lewis had no problems over the weekend. She is growing Pseudomonas in her urine. She is on Maxipime.  Radiology saw her on Sunday. They will plan for the cilia block on Tuesday. I think that this will help with her discomfort.  While she is in the hospital, I really need to get another biopsy of this gastric ulcer. The first biopsy contained just a little bit of tumor. I really need have more specimen so I can send off genetic studies.  I think that we probably would be open to treat her again. I think that she will improve her performance status once this pain improves. I feel confident that the celiac block will do this. Possibly, having an EGD the same day as the celiac block will be possible.  She is eating regular food now. She is doing okay with this.  She is having some bowel movements.  She still is on the PCA Dilaudid. It would be nice to try to transition her over to a time-released medication.  Her labs look okay. Her creatinine is 0.9. Potassium is 4.7. White cell count 5.3. Hemoglobin 10.4.  On her physical exam, her vital signs look stable. Her blood pressure is 117/59. Pulse is 75. Temperature 98.2. Her abdomen is still slightly distended. Bowel sounds are slightly decreased. There is no guarding. Lungs are clear. Cardiac exam regular rate and rhythm. Extremities shows no clubbing, cyanosis or edema. Neurological exam is nonfocal.  Cathy Lewis has progressive renal cell carcinoma. I suspect that this is what is causing her pain. I suspect that there might be some involvement of the celiac plexus. The celiac block hopefully will help with her discomfort.  Again, while she is in the hospital, I really think another EGD with more biopsies of the gastric ulcer would help [place] out.  I think she would be amenable to further treatment. I told her that I would only consider her for any further therapy if she was ambulatory, eating, and have good pain control. She understands  this.  She is very thankful for the wonderful care that she is getting from the staff upon [facility]. I know that, as always, they are showing such compassion for her.   [name], MD  1 Corinthians 13:13

## 2016-09-29 NOTE — Progress Notes (Signed)
Pharmacy Antibiotic Note  Cathy Lewis is a 71 y.o. 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 Patient was prescribed Cephalexin for UTI as outpatient (of note, urine culture from 09/13/16 grew Pseudomonas aeruginosa, which Cephalexin would not cover). Pharmacy has been consulted for Cefepime dosing for UTI.  09/29/2016 Scr 0.9, CrCl ~ 72mls/min WBC 5.3 afebrile  Plan: Continue Cefepime 1g IV q12h. Monitor renal function, cultures, clinical course.   Height: 5\' 4"  (162.6 cm) Weight: 116 lb (52.6 kg) IBW/kg (Calculated) : 54.7  Temp (24hrs), Avg:98.4 F (36.9 C), Min:98.2 F (36.8 C), Max:98.7 F (37.1 C)   Recent Labs Lab 09/25/16 0914 09/26/16 1121 09/27/16 0407 09/28/16 0353 09/29/16 0421  WBC 3.8* 3.3* 2.9* 4.1 5.3  CREATININE 1.1 0.98 0.90 0.97 0.90    Estimated Creatinine Clearance: 46.2 mL/min (by C-G formula based on SCr of 0.9 mg/dL).    Allergies  Allergen Reactions  . Azithromycin Other (See Comments)    Stomach cramps.  Green black stool   . Ciprofloxacin Other (See Comments)    Body aches Chills Heart races  . Etodolac Palpitations and Other (See Comments)    Joints hurt   . Gnp [Acetaminophen] Other (See Comments)    "GRN" (cough syrup) Heart races  . Moxifloxacin Palpitations and Other (See Comments)    Body ache  . Tramadol Nausea And Vomiting and Other (See Comments)    Dizziness  . Rofecoxib Other (See Comments)    Pt unsure     Antimicrobials this admission: 8/3 >> Cefepime >>   Microbiology results: 7/21 UCx: 80K Pseuodomonas aeruginosa (pan-sensitive)  8/3 UCx: 40 k pseudomonas   Thank you for allowing pharmacy to be a part of this patient's care.   Dolly Rias RPh 09/29/2016, 9:39 AM Pager [phone]

## 2016-09-29 NOTE — Progress Notes (Signed)
Patient ID: Cathy Lewis, female   DOB: Jul 09, 1945, 71 y.o.   MRN: [id number]    Progress Note   Subjective    We are asked to see again for repeat EGD with bx of previously noted cratered gastric ulcer  Seen on EGD 7/30 /18 . Biopsy showed poorly differentiated adenocarcinoma  - limited immunohistochemistry suggestive of mets from known renal primary.  Pt has been having severe epigastric pain , currently on PCA and is scheduled for celiac plexus block tomorrow with IR   She has been able to eat but has early satiety , feels as if everything sits in upper abdomen past few weeks    Objective   Vital signs in last 24 hours: Temp:  [98.2 F (36.8 C)-98.7 F (37.1 C)] 98.2 F (36.8 C) (08/06 0439) Pulse Rate:  [56-95] 75 (08/06 0716) Resp:  [12-16] 16 (08/06 0716) BP: (117-128)/(58-61) 117/59 (08/06 0439) SpO2:  [96 %-100 %] 100 % (08/06 0716) Weight:  [116 lb (52.6 kg)-116 lb 14.4 oz (53 kg)] 116 lb (52.6 kg) (08/06 0439) Last BM Date: 09/28/16 General:    white female in NAD Heart:  Regular rate and rhythm; no murmurs Lungs: Respirations even and unlabored, lungs CTA bilaterally Abdomen:  Soft,mildly distended but soft, BS+, no palp mass  Extremities:  Without edema. Neurologic:  Alert and oriented,  grossly normal neurologically. Psych:  Cooperative. Normal mood and affect.  Lab Results:  Recent Labs  09/27/16 0407 09/28/16 0353 09/29/16 0421  WBC 2.9* 4.1 5.3  HGB 10.6* 10.4* 10.4*  HCT 32.8* 32.5* 32.6*  PLT 192 200 228   BMET  Recent Labs  09/27/16 0407 09/28/16 0353 09/29/16 0421  NA 138 141 142  K 4.1 3.8 4.7  CL 108 109 106  CO2 20* 25 27  GLUCOSE 157* 174* 134*  BUN 9 9 9   CREATININE 0.90 0.97 0.90  CALCIUM 7.8* 7.6* 8.1*     Assessment / Plan:    #1 71 yo female with known metastatic renal cell CA  To bone ,lung with new severe epigastric pain. EGD last week with 7-8 mm crated gastric ulcer. Bx consistent with poorly differentiated AdenoCa   Favoring metastatic renal cell  But tissue available for immunohistochemistry limited  So additionaal bx requested   Plan; Have scheduled for EGD with Dr Carlean Purl at 11 am tomorrow . Discussed with pt in detail and she is agreeable to proceed. She is also scheduled for Celiac block per IR tomorrow    LOS: 3 days   Amy Esterwood  09/29/2016, 9:16 AM    [facility] Attending   I have taken an interval history, reviewed the chart and examined the patient. I agree with the Advanced Practitioner's note, impression and recommendations.   Will do EGD and biopsy tomorrow. The risks and benefits as well as alternatives of endoscopic procedure(s) have been discussed and reviewed. All questions answered. The patient agrees to proceed.  Gatha Mayer, MD, [facility] [phone] (pager) 09/29/2016 4:21 PM

## 2016-09-29 NOTE — Progress Notes (Signed)
Daily Progress Note   Patient Name: Cathy Lewis       Date: 09/29/2016 DOB: [birth year]  Age: 71 y.o. MRN#: [id number] Attending Physician: Elmarie Shiley, MD Primary Care Physician: Christain Sacramento, MD Admit Date: 09/26/2016  Reason for Consultation/Follow-up: Establishing goals of care, Non pain symptom management and Pain control  Subjective: I saw and examined Ms. Cathy Lewis.    She reports that her pain is under much better control.  Had another BM this morning, but reports feeling more "bloated."  Length of Stay: 3  Current Medications: Scheduled Meds:  . amLODipine  5 mg Oral Daily  . dexamethasone  8 mg Intravenous Q24H  . fentaNYL  12.5 mcg Transdermal Q72H  . fluticasone  1 spray Each Nare Daily  . HYDROmorphone   Intravenous Q4H  . mometasone-formoterol  2 puff Inhalation BID  . montelukast  10 mg Oral QHS  . pantoprazole  40 mg Oral BID  . senna  2 tablet Oral BID  . sucralfate  1 g Oral TID WC & HS    Continuous Infusions: . ceFEPime (MAXIPIME) IV Stopped (09/29/16 0630)  . dextrose 5 % and 0.9% NaCl 75 mL/hr at 09/28/16 1600    PRN Meds: albuterol, bisacodyl, dicyclomine, diphenhydrAMINE **OR** diphenhydrAMINE, HYDROmorphone (DILAUDID) injection, metoCLOPramide, naloxone **AND** sodium chloride flush, ondansetron (ZOFRAN) IV  Physical Exam   General: Alert, awake, in no acute distress.  HEENT: No bruits, no goiter, no JVD Heart: Regular rate and rhythm. No murmur appreciated. Lungs: Good air movement, clear Abdomen: Soft, nontender, more distended today, positive bowel sounds.  Ext: No significant edema Skin: Warm and dry Neuro: Grossly intact, nonfocal.         Vital Signs: BP (!) 147/72   Pulse 82   Temp 98.7 F (37.1 C) (Oral)   Resp 16   Ht 5'  4" (1.626 m)   Wt 52.6 kg (116 lb)   SpO2 100%   BMI 19.91 kg/m  SpO2: SpO2: 100 % O2 Device: O2 Device: Nasal Cannula O2 Flow Rate: O2 Flow Rate (L/min): 2 L/min  Intake/output summary:   Intake/Output Summary (Last 24 hours) at 09/29/16 1446 Last data filed at 09/29/16 1300  Gross per 24 hour  Intake             3665 ml  Output             1375 ml  Net             2290 ml   LBM: Last BM Date: 09/29/16 Baseline Weight: Weight: 53.5 kg (118 lb) Most recent weight: Weight: 52.6 kg (116 lb)       Palliative Assessment/Data:      Patient Active Problem List   Diagnosis Date Noted  . Uncontrolled pain 09/26/2016  . Acute lower UTI 09/26/2016  . Leukopenia due to antineoplastic chemotherapy ([place]) 09/26/2016  . Chronic left-sided thoracic back pain   . Abdominal pain 09/21/2016  . Anemia 09/21/2016  . Abdominal pain, epigastric   . Chronic constipation   . Presbyopia of both eyes 03/17/2016  . GERD (gastroesophageal reflux disease) 10/09/2015  . Cough variant asthma 05/02/2015  . Metastatic renal cell carcinoma to bone ([place]) 11/20/2014  . Malignant neoplasm metastatic to bone ([place]) 08/29/2014  . H/O therapeutic radiation 08/29/2014  . Carcinoma of kidney ([place]) 08/29/2014  . LBP (low back pain) 08/29/2014  . Atypical chest pain   . Dyslipidemia   . Hypertension   . Syncope     Palliative Care Assessment & Plan   Patient Profile: 71 yo with metastatic Renal Cell Cancer, newly diagnosed Gastric adenocarcinoma followed and treated by [name], she is on daily oral chemotherapy Cabometyx. She has been seen 5 times in th ED for uncontrolled pain and other symptoms in the setting of advanced cancer. She reports worsening symptoms for a month- severe pain in back, pain down her legs -difficulty getting comfortable in general. Not eating well. No BM in 4 days, feels full only passing bits of stool, but no vomiting. Was sent home on low dose oral dilaudid- has not had  adequate pain relief.  Palliative consulted for symptom management and to begin discussion on goals of care.  Recommendations/Plan:  Pain: Reports pain currently well controlled.  She has used 4.5mg  of IV dilaudid in the last 24 hours.  This is equivalent to 90 mg of oral morphine.  Will plan to restart fentanyl patch today.  As plan for celiac plexus block on Tuesday, will be conservative and restart at 12.52mcg/hr, although it may be something that needs to be increased based in success of celiac plexus block.  Based on dilaudid usage over last 2 days, I would anticipate need for 25 per hour as starting point if no block was planned.  Plan to reassess following plexus block.   Constipation: Reports another BM this AM.  Senna 2 tabs BID and escalate bowel regimen as necessary.  She reports that she was using miralax at home without good response.  She reports feeling a little more bloated today and does seem a little more distended.  ? Repeat imaging if continues to progress.  As BM today, I do not think she is obstructed at this time.  Goals of care: Continued discussion deferred per patient desire to "process everything" and discuss further with oncology.  D/w Dr. Hilma Favors who will follow up from PMT tomorrow.  Goals of Care and Additional Recommendations:  Limitations on Scope of Treatment: Full Scope Treatment  Code Status:    Code Status Orders        Start     Ordered   09/26/16 1934  Full code  Continuous     09/26/16 1933    Code Status History    Date Active Date Inactive Code Status Order ID Comments User  Context   09/21/2016  4:29 PM 09/22/2016  8:51 PM Full Code [id number]  Jani Gravel, MD ED   03/11/2011  5:56 PM 03/14/2011  3:47 PM Full Code [id number]  Iantha Fallen, RN Inpatient       Prognosis:   Unable to determine  Discharge Planning:  To Be Determined  Care plan was discussed with patient  Thank you for allowing the Palliative Medicine Team to assist in the  care of this patient.  Total time: 30 minutes Greater than 50%  of this time was spent counseling and coordinating care related to the above assessment and plan.  Micheline Rough, MD  Please contact Palliative Medicine Team phone at [phone] for questions and concerns.

## 2016-09-30 ENCOUNTER — Inpatient Hospital Stay (HOSPITAL_COMMUNITY): Payer: Medicare PPO | Admitting: Certified Registered Nurse Anesthetist

## 2016-09-30 ENCOUNTER — Encounter (HOSPITAL_COMMUNITY): Payer: Self-pay | Admitting: Radiology

## 2016-09-30 ENCOUNTER — Encounter (HOSPITAL_COMMUNITY)
Admission: EM | Disposition: A | Discharge status: Hospice | Payer: Self-pay | Source: Home / Self Care | Attending: Internal Medicine

## 2016-09-30 ENCOUNTER — Inpatient Hospital Stay (HOSPITAL_COMMUNITY): Payer: Medicare PPO

## 2016-09-30 DIAGNOSIS — K3189 Other diseases of stomach and duodenum: Secondary | ICD-10-CM

## 2016-09-30 DIAGNOSIS — K253 Acute gastric ulcer without hemorrhage or perforation: Secondary | ICD-10-CM | POA: Diagnosis present

## 2016-09-30 DIAGNOSIS — R140 Abdominal distension (gaseous): Secondary | ICD-10-CM

## 2016-09-30 HISTORY — PX: ESOPHAGOGASTRODUODENOSCOPY (EGD) WITH PROPOFOL: SHX5813

## 2016-09-30 SURGERY — ESOPHAGOGASTRODUODENOSCOPY (EGD) WITH PROPOFOL
Anesthesia: Monitor Anesthesia Care

## 2016-09-30 MED ORDER — FLUMAZENIL 0.5 MG/5ML IV SOLN
INTRAVENOUS | Status: AC
  Filled 2016-09-30: qty 5

## 2016-09-30 MED ORDER — ALCOHOL 98 % IJ SOLN
50.0000 mL | Freq: Once | INTRAMUSCULAR | Status: DC
  Filled 2016-09-30: qty 50

## 2016-09-30 MED ORDER — PROPOFOL 10 MG/ML IV BOLUS
INTRAVENOUS | Status: DC | PRN
  Administered 2016-09-30 (×2): 20 mg via INTRAVENOUS

## 2016-09-30 MED ORDER — IOPAMIDOL (ISOVUE-300) INJECTION 61%: 30.0000 mL | Freq: Once | INTRAVENOUS | Status: DC | PRN

## 2016-09-30 MED ORDER — MIDAZOLAM HCL 2 MG/2ML IJ SOLN
INTRAMUSCULAR | Status: AC | PRN
  Administered 2016-09-30: 0.5 mg via INTRAVENOUS
  Administered 2016-09-30: 1 mg via INTRAVENOUS

## 2016-09-30 MED ORDER — ALCOHOL 98 % IJ SOLN
INTRAMUSCULAR | Status: AC | PRN
  Administered 2016-09-30: 40 mL

## 2016-09-30 MED ORDER — IOPAMIDOL (ISOVUE-300) INJECTION 61%
INTRAVENOUS | Status: AC
  Filled 2016-09-30: qty 30

## 2016-09-30 MED ORDER — MIDAZOLAM HCL 2 MG/2ML IJ SOLN
INTRAMUSCULAR | Status: AC
  Filled 2016-09-30: qty 8

## 2016-09-30 MED ORDER — LIDOCAINE 2% (20 MG/ML) 5 ML SYRINGE
INTRAMUSCULAR | Status: AC
  Filled 2016-09-30: qty 5

## 2016-09-30 MED ORDER — IOPAMIDOL (ISOVUE-300) INJECTION 61%
INTRAVENOUS | Status: AC | PRN
  Administered 2016-09-30: 1 mL

## 2016-09-30 MED ORDER — ALCOHOL 98 % IJ SOLN
INTRAMUSCULAR | Status: AC
  Filled 2016-09-30: qty 6

## 2016-09-30 MED ORDER — METOCLOPRAMIDE HCL 5 MG/ML IJ SOLN
10.0000 mg | Freq: Three times a day (TID) | INTRAMUSCULAR | Status: DC
  Administered 2016-09-30 – 2016-10-01 (×3): 10 mg via INTRAVENOUS
  Filled 2016-09-30 (×3): qty 2

## 2016-09-30 MED ORDER — FENTANYL CITRATE (PF) 100 MCG/2ML IJ SOLN
INTRAMUSCULAR | Status: AC
  Filled 2016-09-30: qty 8

## 2016-09-30 MED ORDER — LIDOCAINE HCL (PF) 1 % IJ SOLN
INTRAMUSCULAR | Status: AC | PRN
  Administered 2016-09-30: 30 mL via INTRADERMAL

## 2016-09-30 MED ORDER — LACTATED RINGERS IV SOLN
INTRAVENOUS | Status: DC | PRN
  Administered 2016-09-30: 14:00:00 via INTRAVENOUS

## 2016-09-30 MED ORDER — ALCOHOL 98 % IJ SOLN
INTRAMUSCULAR | Status: AC
  Filled 2016-09-30: qty 4

## 2016-09-30 MED ORDER — PANCRELIPASE (LIP-PROT-AMYL) 12000-38000 UNITS PO CPEP
36000.0000 [IU] | ORAL_CAPSULE | Freq: Three times a day (TID) | ORAL | Status: DC
  Administered 2016-10-01 – 2016-10-02 (×5): 36000 [IU] via ORAL
  Filled 2016-09-30 (×5): qty 3

## 2016-09-30 MED ORDER — DOCUSATE SODIUM 100 MG PO CAPS: 100.0000 mg | ORAL_CAPSULE | Freq: Two times a day (BID) | ORAL | Status: DC | PRN

## 2016-09-30 MED ORDER — NALOXONE HCL 0.4 MG/ML IJ SOLN
INTRAMUSCULAR | Status: AC
  Filled 2016-09-30: qty 1

## 2016-09-30 MED ORDER — PROPOFOL 10 MG/ML IV BOLUS
INTRAVENOUS | Status: AC
  Filled 2016-09-30: qty 20

## 2016-09-30 MED ORDER — FENTANYL CITRATE (PF) 100 MCG/2ML IJ SOLN
INTRAMUSCULAR | Status: AC | PRN
  Administered 2016-09-30: 25 ug via INTRAVENOUS
  Administered 2016-09-30: 50 ug via INTRAVENOUS

## 2016-09-30 MED ORDER — LIDOCAINE 2% (20 MG/ML) 5 ML SYRINGE
INTRAMUSCULAR | Status: DC | PRN
  Administered 2016-09-30: 40 mg via INTRAVENOUS

## 2016-09-30 SURGICAL SUPPLY — 15 items

## 2016-09-30 NOTE — Op Note (Signed)
[facility] Patient Name: Cathy Lewis Procedure Date: 09/30/2016 MRN: [id number] Attending MD: Gatha Mayer , MD Date of Birth: [birth year] CSN: [id number] Age: 71 Admit Type: Inpatient Procedure:                Upper GI endoscopy Indications:              metastatic renal cell cancer to stomach with ulcer Providers:                Gatha Mayer, MD, Burtis Junes, RN, Cherylynn Ridges,                            Technician, Marla Roe, CRNA Referring MD:              Medicines:                Propofol per Anesthesia, Monitored Anesthesia Care Complications:            No immediate complications. Estimated Blood Loss:     Estimated blood loss was minimal. Procedure:                Pre-Anesthesia Assessment:                           - Prior to the procedure, a History and Physical                            was performed, and patient medications and                            allergies were reviewed. The patient's tolerance of                            previous anesthesia was also reviewed. The risks                            and benefits of the procedure and the sedation                            options and risks were discussed with the patient.                            All questions were answered, and informed consent                            was obtained. Prior Anticoagulants: The patient has                            taken no previous anticoagulant or antiplatelet                            agents. ASA Grade Assessment: III - A patient with                            severe systemic disease. After reviewing the risks  and benefits, the patient was deemed in                            satisfactory condition to undergo the procedure.                           After obtaining informed consent, the endoscope was                            passed under direct vision. Throughout the                            procedure, the patient's  blood pressure, pulse, and                            oxygen saturations were monitored continuously. The                            [id number] ([id number]) scope was introduced through the                            mouth, and advanced to the antrum of the stomach.                            The upper GI endoscopy was accomplished without                            difficulty. The patient tolerated the procedure                            well. Scope In: Scope Out: Findings:      One non-bleeding heaped up gastric ulcer with no stigmata of bleeding       was found on the greater curvature of the stomach. The lesion was 8 mm       in largest dimension. Biopsies were taken with a cold forceps for       histology. Verification of patient identification for the specimen was       done. Estimated blood loss was minimal.      A single localized, diminutive non-bleeding erosion was found on the       greater curvature of the stomach. There were no stigmata of recent       bleeding.      The exam was otherwise without abnormality.      The cardia and gastric fundus were normal on retroflexion. Impression:               - Non-bleeding gastric ulcer with no stigmata of                            bleeding. Biopsied.                           - Non-bleeding erosive gastropathy.                           - The examination was  otherwise normal. Moderate Sedation:      N/A- Per Anesthesia Care Recommendation:           - Return patient to hospital ward for ongoing care.                           - Resume regular diet.                           - Continue present medications. Procedure Code(s):        --- Professional ---                           [id number], Esophagoscopy, flexible, transoral; with                            biopsy, single or multiple Diagnosis Code(s):        --- Professional ---                           K25.9, Gastric ulcer, unspecified as acute or                            chronic,  without hemorrhage or perforation                           K31.89, Other diseases of stomach and duodenum CPT copyright 2016 [employer]. All rights reserved. The codes documented in this report are preliminary and upon coder review may  be revised to meet current compliance requirements. Gatha Mayer, MD 09/30/2016 2:19:35 PM This report has been signed electronically. Number of Addenda: 0

## 2016-09-30 NOTE — Anesthesia Preprocedure Evaluation (Signed)
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    Pulmonary neg pulmonary ROS, former smoker,    Pulmonary exam normal breath sounds clear to auscultation       Cardiovascular hypertension, Normal cardiovascular exam Rhythm:Regular Rate:Normal     Neuro/Psych negative neurological ROS  negative psych ROS   GI/Hepatic negative GI ROS, Neg liver ROS,   Endo/Other  negative endocrine ROS  Renal/GU negative Renal ROS  negative genitourinary   Musculoskeletal negative musculoskeletal ROS (+)   Abdominal   Peds negative pediatric ROS (+)  Hematology negative hematology ROS (+)   Anesthesia Other Findings   Reproductive/Obstetrics negative OB ROS                             Anesthesia Physical Anesthesia Plan  ASA: III  Anesthesia Plan: MAC   Post-op Pain Management:    Induction: Intravenous  PONV Risk Score and Plan:   Airway Management Planned: Nasal Cannula  Additional Equipment:   Intra-op Plan:   Post-operative Plan:   Informed Consent: I have reviewed the patients History and Physical, chart, labs and discussed the procedure including the risks, benefits and alternatives for the proposed anesthesia with the patient or authorized representative who has indicated his/her understanding and acceptance.   Dental advisory given  Plan Discussed with: CRNA and Surgeon  Anesthesia Plan Comments:         Anesthesia Quick Evaluation

## 2016-09-30 NOTE — Transfer of Care (Signed)
Immediate Anesthesia Transfer of Care Note  Patient: Cathy Lewis  Procedure(s) Performed: Procedure(s): ESOPHAGOGASTRODUODENOSCOPY (EGD) WITH PROPOFOL (N/A)  Patient Location: PACU  Anesthesia Type:MAC  Level of Consciousness:  sedated, patient cooperative and responds to stimulation  Airway & Oxygen Therapy:Patient Spontanous Breathing and Patient connected to face mask oxgen  Post-op Assessment:  Report given to PACU RN and Post -op Vital signs reviewed and stable  Post vital signs:  Reviewed and stable  Last Vitals:  Vitals:   09/30/16 1325 09/30/16 1351  BP: 127/78 101/61  Pulse: 98 93  Resp: 11   Temp:      Complications: No apparent anesthesia complications

## 2016-09-30 NOTE — Discharge Instructions (Signed)
Moderate Conscious Sedation, Adult, Care After  These instructions provide you with information about caring for yourself after your procedure. Your health care provider may also give you more specific instructions. Your treatment has been planned according to current medical practices, but problems sometimes occur. Call your health care provider if you have any problems or questions after your procedure.  What can I expect after the procedure?  After your procedure, it is common:   To feel sleepy for several hours.   To feel clumsy and have poor balance for several hours.   To have poor judgment for several hours.   To vomit if you eat too soon.    Follow these instructions at home:  For at least 24 hours after the procedure:     Do not:  ? Participate in activities where you could fall or become injured.  ? Drive.  ? Use heavy machinery.  ? Drink alcohol.  ? Take sleeping pills or medicines that cause drowsiness.  ? Make important decisions or sign legal documents.  ? Take care of children on your own.   Rest.  Eating and drinking   Follow the diet recommended by your health care provider.   If you vomit:  ? Drink water, juice, or soup when you can drink without vomiting.  ? Make sure you have little or no nausea before eating solid foods.  General instructions   Have a responsible adult stay with you until you are awake and alert.   Take over-the-counter and prescription medicines only as told by your health care provider.   If you smoke, do not smoke without supervision.   Keep all follow-up visits as told by your health care provider. This is important.  Contact a health care provider if:   You keep feeling nauseous or you keep vomiting.   You feel light-headed.   You develop a rash.   You have a fever.  Get help right away if:   You have trouble breathing.  This information is not intended to replace advice given to you by your health care provider. Make sure you discuss any questions you have  with your health care provider.  Document Released: 12/01/2012 Document Revised: 07/16/2015 Document Reviewed: 06/02/2015  Elsevier Interactive Patient Education  2018 [employer]

## 2016-09-30 NOTE — Procedures (Signed)
  Procedure:   Celiac plexus neurolysis    Preprocedure diagnosis:  Chronic severe abdominal pain Postprocedure diagnosis:  same EBL:     minimal Complications:   none immediate  See full dictation in [employer].  Dillard Cannon MD Main # [phone] Pager  [phone]

## 2016-09-30 NOTE — Progress Notes (Signed)
PROGRESS NOTE    Cathy Lewis  JOI:[id number] DOB: December 16, 1945 DOA: 09/26/2016 PCP: Christain Sacramento, MD    Brief Narrative: Cathy Lewis is a 71 y.o. 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 Patient with frequents ED visit due to severe , uncontrolled pain. She report also poor oral intake. She is complaining of back pain, and abdominal pain. Denies vomiting, but she has not been able to eat. She has not had a BM in days. She has pass flatus. She is suppose to be taking antibiotics for UTI, antibiotics prescribe by PCP.  She denies dysuria, chest pain or dyspnea.   ED Course: Patient presents complaining of severe pain, leukopenia with WBC at 3.3, Hb at 11, K at 3.6, CO2; 20, UA with too numerous to count WBC. KUB non obstructive bowel pattern.     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 Vs metastasis to stomach , Severe uncontrolled pain, back and abdomen; Pain likely related to malignancy.  Appreciate Dr Domingo Cocking  help with pain management.  IV Protonix BID.  Continue with Carafate.  on PCA pump. Palliative helping with pain management.  IV decadron.  Dr Marin Olp recommends celiac plexus block. IR consulted. Patient underwent celiac block today.  GI consulted for repeat endoscopy, gastric biopsy. Patient underwent endoscopy for repat biopsy 8-07.   2-Abdominal Distension. Constipation. Resolved.    Suppository ordered.  KUB negative for obstruction.  IV fluids.  Having BM  Started on IV reglan  3-Poor oral Intake;  IV fluids.  Tolerating regular diet  Pain controlled.  She has been eating more.   4-UTI; Patient was suppose to be taking antibiotics for UTI.  Last Urine culture on July grew Pseudomonas.  Urine culture. Follow results 40,000 colonies gram negative rods.  Continue with cefepime day 5/5. Discontinue IV antibiotics 8-08  5-Leukopenia; in setting of chemo.  Treat UTI.  Monitor for fever.  resolved.     DVT prophylaxis: SCD.  Code Status: full code.  Family Communication: care discussed with patient  Disposition Plan: remain in the hospital for pain controlled.   Consultants:   Palliative care.   Dr Marin Olp.    Procedures:   none   Antimicrobials:   Cefepime 8-03   Subjective: Just came from endoscopy. She needs to be clean, just had BM.  She is sedated, open eyes to voice, say few words.    Objective: Vitals:   09/30/16 1111 09/30/16 1239 09/30/16 1245 09/30/16 1250  BP: (!) 142/83 138/70 134/76 123/69  Pulse: 81 85 92 87  Resp: 18 17 12 15   Temp:      TempSrc:      SpO2: 100% 96% 97% 98%  Weight:      Height:        Intake/Output Summary (Last 24 hours) at 09/30/16 1250 Last data filed at 09/30/16 0800  Gross per 24 hour  Intake  2247.25 ml  Output             2450 ml  Net          -202.75 ml   Filed Weights   09/29/16 0031 09/29/16 0439 09/30/16 0530  Weight: 53 kg (116 lb 14.4 oz) 52.6 kg (116 lb) 55 kg (121 lb 4.1 oz)    Examination:  General exam: NAD, sedated.  Respiratory system: CTA. Normal respiratory effort  Cardiovascular system: S 1 , S 2 RRR Gastrointestinal system; Abdomen soft, nt Central nervous system: sedated post procedure.  Extremities: Symmetric power.  Skin: No rash, lesion     Data Reviewed: I have personally reviewed following labs and imaging studies  CBC:  Recent  Labs Lab 09/25/16 0914 09/26/16 1121 09/27/16 0407 09/28/16 0353 09/29/16 0421  WBC 3.8* 3.3* 2.9* 4.1 5.3  NEUTROABS 2.0  --   --   --   --   HGB 12.8 11.1* 10.6* 10.4* 10.4*  HCT 38.5 34.2* 32.8* 32.5* 32.6*  MCV 98 96.9 97.6 97.6 99.4  PLT 207 186 192 200 253   Basic Metabolic Panel:  Recent Labs Lab 09/25/16 0914 09/26/16 1121 09/27/16 0407 09/28/16 0353 09/29/16 0421  NA 134 136 138 141 142  K 4.0 3.6 4.1 3.8 4.7  CL 103 105 108 109 106  CO2 25 20* 20* 25 27  GLUCOSE 105 91 157* 174* 134*  BUN 4* 7 9 9 9   CREATININE 1.1 0.98 0.90 0.97 0.90  CALCIUM 9.0 8.0* 7.8* 7.6* 8.1*   GFR: Estimated Creatinine Clearance: 48.1 mL/min (by C-G formula based on SCr of 0.9 mg/dL). Liver Function Tests:  Recent Labs Lab 09/25/16 0914 09/26/16 1121  AST 33 29  ALT 25 22  ALKPHOS 57 52  BILITOT 0.60 0.4  PROT 6.7 6.1*  ALBUMIN 3.7 3.5    Recent Labs Lab 09/26/16 1121  LIPASE 23   No results for input(s): AMMONIA in the last 168 hours. Coagulation Profile: No results for input(s): INR, PROTIME in the last 168 hours. Cardiac Enzymes: No results for input(s): CKTOTAL, CKMB, CKMBINDEX, TROPONINI in the last 168 hours. BNP (last 3 results) No results for input(s): PROBNP in the last 8760 hours. HbA1C: No results for input(s): HGBA1C in the last 72 hours. CBG: No results for input(s): GLUCAP in the last 168 hours. Lipid Profile: No results for input(s): CHOL, HDL, LDLCALC, TRIG, CHOLHDL, LDLDIRECT in the last 72 hours. Thyroid Function Tests: No results for input(s): TSH, T4TOTAL, FREET4, T3FREE, THYROIDAB in the last 72 hours. Anemia Panel: No results for input(s): VITAMINB12, FOLATE, FERRITIN, TIBC, IRON, RETICCTPCT in the last 72 hours. Sepsis Labs: No results for input(s): PROCALCITON, LATICACIDVEN in the last 168 hours.  Recent Results (from the past 240 hour(s))  Urine Culture     Status: Abnormal   Collection Time: 09/26/16  3:22 PM  Result Value Ref  Range Status   Specimen Description URINE, RANDOM  Final   Special Requests NONE  Final   Culture 40,000 COLONIES/mL PSEUDOMONAS AERUGINOSA (A)  Final   Report Status 09/29/2016 FINAL  Final   Organism ID, Bacteria PSEUDOMONAS AERUGINOSA (A)  Final      Susceptibility   Pseudomonas aeruginosa - MIC*    CEFTAZIDIME 4 SENSITIVE Sensitive     CIPROFLOXACIN <=0.25 SENSITIVE Sensitive     GENTAMICIN <=1 SENSITIVE Sensitive     IMIPENEM 1 SENSITIVE Sensitive     PIP/TAZO <=4 SENSITIVE Sensitive     CEFEPIME <=1 SENSITIVE Sensitive     *  40,000 COLONIES/mL PSEUDOMONAS AERUGINOSA         Radiology Studies: No results found.      Scheduled Meds: . Alcohol  50 mL Intracatheter Once  . Alcohol      . Alcohol      . amLODipine  5 mg Oral Daily  . dexamethasone  8 mg Intravenous Q24H  . fentaNYL  12.5 mcg Transdermal Q72H  . fentaNYL      . flumazenil      . fluticasone  1 spray Each Nare Daily  . HYDROmorphone   Intravenous Q4H  . iopamidol      . lipase/protease/amylase  36,000 Units Oral TID AC  . metoCLOPramide (REGLAN) injection  10 mg Intravenous Q8H  . midazolam      . mometasone-formoterol  2 puff Inhalation BID  . montelukast  10 mg Oral QHS  . naloxone      . pantoprazole  40 mg Oral BID  . senna  2 tablet Oral BID  . sucralfate  1 g Oral TID WC & HS   Continuous Infusions: . ceFEPime (MAXIPIME) IV 1 g (09/30/16 0504)  . dextrose 5 % and 0.9% NaCl 75 mL/hr at 09/29/16 2236     LOS: 4 days    Time spent: 35 minutes.     Elmarie Shiley, MD [facility] Pager [phone]  If 7PM-7AM, please contact night-coverage [email or web address] Password [email or web address] 09/30/2016, 12:50 PM

## 2016-09-30 NOTE — H&P (View-Only) (Signed)
Patient ID: Cathy Lewis, female   DOB: Mar 23, 1945, 71 y.o.   MRN: [id number]     Progress Note   Subjective   Pain is about the same -epigastric radiating to her back - awaiting celiac  block thia am, then EGD to follow with repeat bx HGB 10.4   Objective   Vital signs in last 24 hours: Temp:  [98.2 F (36.8 C)-98.7 F (37.1 C)] 98.4 F (36.9 C) (08/07 0530) Pulse Rate:  [81-94] 81 (08/07 0808) Resp:  [11-18] 18 (08/07 0831) BP: (129-147)/(64-83) 142/83 (08/07 0530) SpO2:  [97 %-100 %] 100 % (08/07 0831) Weight:  [121 lb 4.1 oz (55 kg)] 121 lb 4.1 oz (55 kg) (08/07 0530) Last BM Date: 09/30/16 General:    white female in NAD, uncomfortable appearing Heart:  Regular rate and rhythm; no murmurs Lungs: Respirations even and unlabored, lungs CTA bilaterally Abdomen:  Soft, tender epigastrium, mildly distended. BS decreased Extremities:  Without edema. Neurologic:  Alert and oriented,  grossly normal neurologically. Psych:  Cooperative. Normal mood and affect.  Intake/Output from previous day: 08/06 0701 - 08/07 0700 In: 4076 [P.O.:800; I.V.:3225; IV Piggyback:51] Out: 1497 [Urine:2350] Intake/Output this shift: Total I/O In: 1475 [I.V.:1425; IV Piggyback:50] Out: 300 [Urine:300]  Lab Results:  Recent Labs  09/28/16 0353 09/29/16 0421  WBC 4.1 5.3  HGB 10.4* 10.4*  HCT 32.5* 32.6*  PLT 200 228   BMET  Recent Labs  09/28/16 0353 09/29/16 0421  NA 141 142  K 3.8 4.7  CL 109 106  CO2 25 27  GLUCOSE 174* 134*  BUN 9 9  CREATININE 0.97 0.90  CALCIUM 7.6* 8.1*   LFT No results for input(s): PROT, ALBUMIN, AST, ALT, ALKPHOS, BILITOT, BILIDIR, IBILI in the last 72 hours. PT/INR No results for input(s): LABPROT, INR in the last 72 hours.      Assessment / Plan:    #1 71 yo female with metastatic renal cell CA with severe epigastric pain radiating to back - 7- 8 mm  Cratered gastric ulcer on EGD last week - BX consistent with poorly differentiated  adenoCA -favoring metastatic renal CA. 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

## 2016-09-30 NOTE — Interval H&P Note (Signed)
History and Physical Interval Note:  09/30/2016 1:48 PM  Cathy Lewis  has presented today for surgery, with the diagnosis of gastric ulcer  The various methods of treatment have been discussed with the patient and family. After consideration of risks, benefits and other options for treatment, the patient has consented to  Procedure(s): ESOPHAGOGASTRODUODENOSCOPY (EGD) WITH PROPOFOL (N/A) as a surgical intervention .  The patient's history has been reviewed, patient examined, no change in status, stable for surgery.  I have reviewed the patient's chart and labs.  Questions were answered to the patient's satisfaction.     Silvano Rusk

## 2016-09-30 NOTE — Progress Notes (Signed)
Today will be a big day for Cathy Lewis. She is going to have the celiac block. She also is going to have the upper endoscopy. I totally, totally appreciate everybody's coordinating the procedures to be done today. I think this is fantastic.  Her abdomen is more distended. She just has a lot of gas. I'm going to try her on some Creon. Maybe this will help a little bit.  I'll also try her on some Reglan. I don't think she has any obstructive issues from the x-rays that she has had done.  There is no nausea or vomiting.  Her pain is doing okay. She is still on the Dilaudid PCA. Hopefully, after the celiac block, she can be taken off the PCA and put onto some time-released pain medication.  She has the Pseudomonas in her urine. Hopefully, this will be cleared up with the antibiotics.  She has had no labs yet done this morning.  On her physical exam, her vital signs all look stable. Temperature is 98.4. Blood pressure 143/83. Pulse is 85. Her abdomen is slightly more distended. Bowel sounds are present but decreased. She has some hyper tympany to percussion. There is no fluid wave. There is no palpable liver or spleen tip. Extremities shows no clubbing, cyanosis or edema. Cardiac exam regular rate and rhythm. Lungs are clear.  Again, this will be a critical day for Cathy Lewis. Hopefully, she will get relief with the celiac block. Maybe, she will also have improvement of her abdominal distention. I suppose that she could have involvement of the intra-abdominal nerve plexuses that could be controlling her intestinal propulsion.  Again, I appreciate everybody's help. As always, the staff on [address] had done a tremendous job.  [name], MD  [place] 4:4

## 2016-09-30 NOTE — Anesthesia Procedure Notes (Signed)
Date/Time: 09/30/2016 2:03 PM Performed by: Talbot Grumbling Oxygen Delivery Method: Nasal cannula

## 2016-09-30 NOTE — Anesthesia Postprocedure Evaluation (Signed)
Anesthesia Post Note  Patient: Cathy Lewis  Procedure(s) Performed: Procedure(s) (LRB): ESOPHAGOGASTRODUODENOSCOPY (EGD) WITH PROPOFOL (N/A)     Patient location during evaluation: PACU Anesthesia Type: MAC Level of consciousness: awake and alert Pain management: pain level controlled Vital Signs Assessment: post-procedure vital signs reviewed and stable Respiratory status: spontaneous breathing, nonlabored ventilation, respiratory function stable and patient connected to nasal cannula oxygen Cardiovascular status: stable and blood pressure returned to baseline Anesthetic complications: no    Last Vitals:  Vitals:   09/30/16 1351 09/30/16 1418  BP: 101/61 (!) 79/48  Pulse: 93 91  Resp:  17  Temp:      Last Pain:  Vitals:   09/30/16 1418  TempSrc: Oral  PainSc:                  Briunna Leicht S

## 2016-09-30 NOTE — Progress Notes (Signed)
Patient ID: Cathy Lewis, female   DOB: April 28, 1945, 71 y.o.   MRN: [id number]     Progress Note   Subjective   Pain is about the same -epigastric radiating to her back - awaiting celiac  block thia am, then EGD to follow with repeat bx HGB 10.4   Objective   Vital signs in last 24 hours: Temp:  [98.2 F (36.8 C)-98.7 F (37.1 C)] 98.4 F (36.9 C) (08/07 0530) Pulse Rate:  [81-94] 81 (08/07 0808) Resp:  [11-18] 18 (08/07 0831) BP: (129-147)/(64-83) 142/83 (08/07 0530) SpO2:  [97 %-100 %] 100 % (08/07 0831) Weight:  [121 lb 4.1 oz (55 kg)] 121 lb 4.1 oz (55 kg) (08/07 0530) Last BM Date: 09/30/16 General:    white female in NAD, uncomfortable appearing Heart:  Regular rate and rhythm; no murmurs Lungs: Respirations even and unlabored, lungs CTA bilaterally Abdomen:  Soft, tender epigastrium, mildly distended. BS decreased Extremities:  Without edema. Neurologic:  Alert and oriented,  grossly normal neurologically. Psych:  Cooperative. Normal mood and affect.  Intake/Output from previous day: 08/06 0701 - 08/07 0700 In: 4076 [P.O.:800; I.V.:3225; IV Piggyback:51] Out: 9518 [Urine:2350] Intake/Output this shift: Total I/O In: 1475 [I.V.:1425; IV Piggyback:50] Out: 300 [Urine:300]  Lab Results:  Recent Labs  09/28/16 0353 09/29/16 0421  WBC 4.1 5.3  HGB 10.4* 10.4*  HCT 32.5* 32.6*  PLT 200 228   BMET  Recent Labs  09/28/16 0353 09/29/16 0421  NA 141 142  K 3.8 4.7  CL 109 106  CO2 25 27  GLUCOSE 174* 134*  BUN 9 9  CREATININE 0.97 0.90  CALCIUM 7.6* 8.1*   LFT No results for input(s): PROT, ALBUMIN, AST, ALT, ALKPHOS, BILITOT, BILIDIR, IBILI in the last 72 hours. PT/INR No results for input(s): LABPROT, INR in the last 72 hours.      Assessment / Plan:    #1 71 yo female with metastatic renal cell CA with severe epigastric pain radiating to back - 7- 8 mm  Cratered gastric ulcer on EGD last week - BX consistent with poorly differentiated  adenoCA -favoring metastatic renal CA. 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

## 2016-10-01 DIAGNOSIS — I10 Essential (primary) hypertension: Secondary | ICD-10-CM

## 2016-10-01 DIAGNOSIS — R52 Pain, unspecified: Secondary | ICD-10-CM

## 2016-10-01 DIAGNOSIS — C7951 Secondary malignant neoplasm of bone: Secondary | ICD-10-CM

## 2016-10-01 DIAGNOSIS — K5909 Other constipation: Secondary | ICD-10-CM

## 2016-10-01 LAB — BASIC METABOLIC PANEL
Anion gap: 7 (ref 5–15)
BUN: 18 mg/dL (ref 6–20)
CALCIUM: 7.3 mg/dL — AB (ref 8.9–10.3)
CO2: 27 mmol/L (ref 22–32)
Chloride: 105 mmol/L (ref 101–111)
Creatinine, Ser: 0.9 mg/dL (ref 0.44–1.00)
Glucose, Bld: 122 mg/dL — ABNORMAL HIGH (ref 65–99)
Potassium: 3.9 mmol/L (ref 3.5–5.1)
Sodium: 139 mmol/L (ref 135–145)

## 2016-10-01 LAB — CBC
HEMATOCRIT: 34 % — AB (ref 36.0–46.0)
Hemoglobin: 10.8 g/dL — ABNORMAL LOW (ref 12.0–15.0)
MCH: 30.7 pg (ref 26.0–34.0)
MCHC: 31.8 g/dL (ref 30.0–36.0)
MCV: 96.6 fL (ref 78.0–100.0)
Platelets: 214 10*3/uL (ref 150–400)
RBC: 3.52 MIL/uL — ABNORMAL LOW (ref 3.87–5.11)
RDW: 18.3 % — AB (ref 11.5–15.5)
WBC: 5.4 10*3/uL (ref 4.0–10.5)

## 2016-10-01 MED ORDER — METOCLOPRAMIDE HCL 5 MG/ML IJ SOLN
5.0000 mg | Freq: Three times a day (TID) | INTRAMUSCULAR | Status: DC
  Administered 2016-10-01 – 2016-10-03 (×6): 5 mg via INTRAVENOUS
  Filled 2016-10-01 (×6): qty 2

## 2016-10-01 MED ORDER — SENNA 8.6 MG PO TABS
2.0000 | ORAL_TABLET | Freq: Every day | ORAL | Status: DC
  Administered 2016-10-02: 17.2 mg via ORAL
  Filled 2016-10-01 (×2): qty 2

## 2016-10-01 MED ORDER — HYDROMORPHONE HCL 2 MG PO TABS
2.0000 mg | ORAL_TABLET | ORAL | Status: DC | PRN
  Administered 2016-10-01 – 2016-10-02 (×6): 2 mg via ORAL
  Filled 2016-10-01 (×7): qty 1

## 2016-10-01 MED ORDER — DEXAMETHASONE SODIUM PHOSPHATE 4 MG/ML IJ SOLN
4.0000 mg | INTRAMUSCULAR | Status: DC
  Administered 2016-10-01 – 2016-10-02 (×2): 4 mg via INTRAVENOUS
  Filled 2016-10-01 (×2): qty 1

## 2016-10-01 NOTE — Progress Notes (Signed)
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 Patient with frequents ED visit due to severe , uncontrolled pain. She report also poor oral intake. She is complaining of back pain, and abdominal pain. Denies vomiting, but she has not been able to eat. She has not had a BM in days. She has pass flatus. She is suppose to be taking antibiotics for UTI, antibiotics prescribe by PCP.  She denies dysuria, chest pain or dyspnea.   ED Course:Patient presents complaining of severe pain, leukopenia with WBC at 3.3, Hb at 11, K at 3.6, CO2; 20, UA with too numerous to count WBC. KUB non obstructive bowel pattern.     Assessment & Plan:   Principal Problem:   Metastatic renal cell carcinoma (HCC) Active Problems:   Hypertension   Metastatic renal cell carcinoma to bone (HCC)   Malignant neoplasm metastatic to bone (HCC)   Carcinoma of kidney (HCC)   Chronic constipation   Abdominal pain   Uncontrolled pain   Acute lower UTI   Leukopenia due to antineoplastic chemotherapy ([place])   Acute gastric ulcer without hemorrhage or perforation  1-Metastatic Renal cell carcinoma, new Diagnosis of Gastric cancer; ? Vs metastasis to stomach , Severe uncontrolled pain, back and abdomen; Pain likely secondary to malignancy.  Appreciate Dr Delia Chimes Hilma Favors  help with pain management.  IV Protonix BID.  Continue with Carafate.  on PCA pump. PCA pump to be discontinued and patient transitioned to oral pain regimen per palliative care for pain control in anticipation of discharge. Patient placed on a fentanyl patch. IV decadron.  Dr Marin Olp recommended  celiac plexus block which patient underwent. GI consulted for repeat endoscopy, gastric biopsy. Gastric biopsy from 09/22/2016 positive for adenocarcinoma. Patient underwent endoscopy for repat biopsy 8-07. Patient noted to have a gastric ulcer and erosive gastropathy. Continue PPI. Patient placed on Creon.  2-Abdominal Distension. Constipation. Resolved.  Suppository ordered.  KUB negative for obstruction.  IV fluids.  Having BM  Started on IV reglan  3-Poor oral Intake;  IV fluids.  Tolerating regular diet  Pain controlled.  She has been eating more.   4-UTI; Patient was suppose to be taking antibiotics for UTI.  Last Urine culture on July grew Pseudomonas.  Urine culture with 40,000 colonies of pseudomonas aeruginosa.  Continue with cefepime day 5/5. Discontinue IV antibiotics 8-08  5-Leukopenia; in setting of chemo.  Treat UTI.  Monitor for fever.  resolved.   6. Gastric ulcer/erosive gastropathy Per upper endoscopy. Continue PPI. Per GI. Biopsies from prior gastric ulcer of 09/22/2016 positive for adenocarcinoma.    DVT prophylaxis: SCDs Code Status: Full Family Communication: Updated patient. No family at bedside. Disposition Plan: Home once pain management has improved hopefully in the next 24-48 hours.   Consultants:   Oncology: [name] 09/27/2016  Palliative care: Dr.Golding 09/26/2016  Procedures:   Upper endoscopy 09/30/2016--Dr. Carlean Purl  CT abdomen and pelvis 09/25/2016  CT-guided neurolytic ablation of the celiac axis per Dr. Jarvis Newcomer 09/30/2016  Abdominal x-ray 09/26/2016, 09/25/2016  Antimicrobials:   IV cefepime 09/26/2016>>>>> 10/01/2016   Subjective: Patient complaining of right upper quadrant pain. Patient denies any nausea or emesis states is tolerating current diet.  Patient states having multiple bowel movements. Patient states overall pain management has improved.  Objective: Vitals:   10/01/16 0500 10/01/16 0519 10/01/16  0748 10/01/16 0800  BP:  (!) 112/59    Pulse:  80 79   Resp:  12 14 14   Temp:  98 F (36.7 C)    TempSrc:  Oral    SpO2:  99% 98% 98%  Weight: 54.8 kg (120 lb 13 oz)     Height:        Intake/Output Summary (Last 24 hours) at 10/01/16 1208 Last data filed at 10/01/16 0945  Gross per 24 hour  Intake             1775 ml  Output                1 ml  Net             1774 ml   Filed Weights   09/29/16 0439 09/30/16 0530 10/01/16 0500  Weight: 52.6 kg (116 lb) 55 kg (121 lb 4.1 oz) 54.8 kg (120 lb 13 oz)    Examination:  General exam: In pain. Respiratory system: Clear to auscultation anterior lung fields. Respiratory effort normal. Cardiovascular system: S1 & S2 heard, RRR. No JVD, murmurs, rubs, gallops or clicks. No pedal edema. Gastrointestinal system: Abdomen is nondistended, soft and tender to palpation in the right upper quadrant. No organomegaly or masses felt. Normal bowel sounds heard. Central nervous system: Alert and oriented. No focal neurological deficits. Extremities: Symmetric 5 x 5 power. Skin: No rashes, lesions or ulcers Psychiatry: Judgement and insight appear normal. Mood & affect appropriate.     Data Reviewed: I have personally reviewed following labs and imaging studies  CBC:  Recent Labs Lab 09/25/16 0914 09/26/16 1121 09/27/16 0407 09/28/16 0353 09/29/16 0421 10/01/16 0343  WBC 3.8* 3.3* 2.9* 4.1 5.3 5.4  NEUTROABS 2.0  --   --   --   --   --   HGB 12.8 11.1* 10.6* 10.4* 10.4* 10.8*  HCT 38.5 34.2* 32.8* 32.5* 32.6* 34.0*  MCV 98 96.9 97.6 97.6 99.4 96.6  PLT 207 186 192 200 228 696   Basic Metabolic Panel:  Recent Labs Lab 09/26/16 1121 09/27/16 0407 09/28/16 0353 09/29/16 0421 10/01/16 0343  NA 136 138 141 142 139  K 3.6 4.1 3.8 4.7 3.9  CL 105 108 109 106 105  CO2 20* 20* 25 27 27   GLUCOSE 91 157* 174* 134* 122*  BUN 7 9 9 9 18   CREATININE 0.98 0.90 0.97 0.90 0.90  CALCIUM 8.0* 7.8* 7.6* 8.1* 7.3*   GFR: Estimated  Creatinine Clearance: 48.1 mL/min (by C-G formula based on SCr of 0.9 mg/dL). Liver Function Tests:  Recent Labs Lab 09/25/16 0914 09/26/16 1121  AST 33 29  ALT 25 22  ALKPHOS 57 52  BILITOT 0.60 0.4  PROT 6.7 6.1*  ALBUMIN 3.7 3.5    Recent Labs Lab 09/26/16 1121  LIPASE 23   No results for input(s): AMMONIA in the last 168 hours. Coagulation Profile: No results for input(s): INR, PROTIME in the last 168 hours. Cardiac Enzymes: No results for input(s): CKTOTAL, CKMB, CKMBINDEX, TROPONINI in the last 168 hours. BNP (last 3 results) No results for input(s): PROBNP in the last 8760 hours. HbA1C: No results for input(s): HGBA1C in the last 72 hours. CBG: No results for input(s): GLUCAP in the last 168 hours. Lipid Profile: No results for input(s): CHOL, HDL, LDLCALC, TRIG, CHOLHDL, LDLDIRECT in the  last 72 hours. Thyroid Function Tests: No results for input(s): TSH, T4TOTAL, FREET4, T3FREE, THYROIDAB in the last 72 hours. Anemia Panel: No results for input(s): VITAMINB12, FOLATE, FERRITIN, TIBC, IRON, RETICCTPCT in the last 72 hours. Sepsis Labs: No results for input(s): PROCALCITON, LATICACIDVEN in the last 168 hours.  Recent Results (from the past 240 hour(s))  Urine Culture     Status: Abnormal   Collection Time: 09/26/16  3:22 PM  Result Value Ref Range Status   Specimen Description URINE, RANDOM  Final   Special Requests NONE  Final   Culture 40,000 COLONIES/mL PSEUDOMONAS AERUGINOSA (A)  Final   Report Status 09/29/2016 FINAL  Final   Organism ID, Bacteria PSEUDOMONAS AERUGINOSA (A)  Final      Susceptibility   Pseudomonas aeruginosa - MIC*    CEFTAZIDIME 4 SENSITIVE Sensitive     CIPROFLOXACIN <=0.25 SENSITIVE Sensitive     GENTAMICIN <=1 SENSITIVE Sensitive     IMIPENEM 1 SENSITIVE Sensitive     PIP/TAZO <=4 SENSITIVE Sensitive     CEFEPIME <=1 SENSITIVE Sensitive     * 40,000 COLONIES/mL PSEUDOMONAS AERUGINOSA         Radiology Studies: Ct  Biopsy  Result Date: 09/30/2016 CLINICAL DATA:  metastatic renal cell cancer to bone and newly diagnosed gastric cancer. Biopsy done on 7/30 pathology revealed adenocarcinoma. She is currently on Chemotherapy. CT scan 7/27 showed pulmonary,bone, liver and adrenal metastasis. She has frequent ED visit due to severe, uncontrolled pain. She complains of abdominal pain that goes through to her back. We are asked to celiac block for pain control. EXAM: CT GUIDED NEUROLYTIC ABLATION OF THE CELIAC AXIS ANESTHESIA/SEDATION: Intravenous Fentanyl and Versed were administered as conscious sedation during continuous monitoring of the patient's level of consciousness and physiological / cardiorespiratory status by the radiology RN, with a total moderate sedation time of 48 minutes. PROCEDURE: The procedure risks, benefits, and alternatives were explained to the patient. Questions regarding the procedure were encouraged and answered. The patient understands and consents to the procedure. The anterior abdominal wall was prepped with chlorhexidine in a sterile fashion, and a sterile drape was applied covering the operative field. A sterile gown and sterile gloves were used for the procedure. Local anesthesia was provided with 1% Lidocaine. A 22 gauge Chiba needle was advanced under CT guidance from a left anterolateral approach to the level of the celiac plexus. After confirming and adjusting needle tip position, approximately three ml of a 1:10 dilution of Omnipaque-300 contrast and lidocaine 1% was injected. Spread of diluted contrast material to the left of midline was confirmed by CT. In similar fashion, from a right parasagittal approach a 22 gauge Chiba needle was advanced under CT guidance to the level of the celiac plexus. After confirming needle tip position, approximately three ml of a 1:10 dilution of Omnipaque-300 contrast and lidocaine 1% was injected. Spread of diluted contrast material to the right of midline was  confirmed by CT. Injection of 20 mL lidocaine 1% was then divided between 2 sites. Patient was observed for 15 minutes at number no adverse complications. Alcohol ablation of the celiac plexus was then performed with injection of 40 ml of absolute alcohol divided between the 2 sites. CT confirms appropriate alcohol distribution, no bleeding or other apparent complication. COMPLICATIONS: None FINDINGS: Contrast injection showed excellent spread bilaterally at the level of the celiac plexus. Alcohol ablation was successfully performed. The patient tolerated the procedure well. IMPRESSION: 1. Technically successful CT guided neurolytic ablation of the celiac  plexus as above. Electronically Signed   By: Lucrezia Europe M.D.   On: 09/30/2016 14:52        Scheduled Meds: . Alcohol  50 mL Intracatheter Once  . amLODipine  5 mg Oral Daily  . dexamethasone  4 mg Intravenous Q24H  . fentaNYL  12.5 mcg Transdermal Q72H  . fluticasone  1 spray Each Nare Daily  . lipase/protease/amylase  36,000 Units Oral TID AC  . metoCLOPramide (REGLAN) injection  5 mg Intravenous Q8H  . mometasone-formoterol  2 puff Inhalation BID  . pantoprazole  40 mg Oral BID  . senna  2 tablet Oral Daily  . sucralfate  1 g Oral TID WC & HS   Continuous Infusions: . ceFEPime (MAXIPIME) IV Stopped (10/01/16 0540)  . dextrose 5 % and 0.9% NaCl 75 mL/hr at 09/29/16 2236     LOS: 5 days    Time spent: 51 minutes    Kyson Kupper, MD [facility] Pager [phone]  If 7PM-7AM, please contact night-coverage [email or web address] Password [email or web address] 10/01/2016, 12:08 PM

## 2016-10-01 NOTE — Plan of Care (Signed)
Problem: Health Behavior/Discharge Planning: Goal: Ability to manage health-related needs will improve Outcome: Progressing Pall Dr Hilma Favors d/c PCA; Pt now tolerating PO pain med in anticipation for d/c (poss 10/02/16)

## 2016-10-01 NOTE — Progress Notes (Signed)
Cathy Lewis had both the celiac block and upper endoscopy yesterday. She did well with both these procedures. Her abdomen clearly is less distended today.  It is hard to say how her pain is doing. It does not look like she is using the PCA all that much.  I think we have to begin to transition her over to something long-acting. I probably would favor a fentanyl patch.  We started her on Creon. Hopefully, with eating, she will not get bloated. I also have her on Reglan.  She has had no bleeding. She's had no fever. She's had no rashes.  Her labs look pretty stable today.  On her physical exam, her vital signs look pretty stable. Her temperature is 90.8. Pulse 80. Blood pressure is 112/59. Her abdomen is less distended. Bowel sounds might be slightly better. There is no guarding or rebound. There is no fluid wave. There is no abdominal mass. Lungs are clear. Cardiac exam regular rate and rhythm. Extremities shows no clubbing, cyanosis or edema.  Cathy Lewis has metastatic renal cell carcinoma. She has progressive disease. She would be a candidate for additional therapy if her performance status can improve.  She has had the celiac block. Hopefully this will provide some good pain control for her.  Again, I think she really needs to begin to transition off of the PCA onto something long-acting.  We will see what she eats today. Hopefully, she will not develop abdominal pain and bloating.  She is very pleased with the wonderful care that she is getting from everybody up on [facility]. This really is no surprise to me.  [name], MD  Psalm 16:8

## 2016-10-01 NOTE — Progress Notes (Addendum)
Daily Progress Note   Patient Name: Cathy Lewis       Date: 10/01/2016 DOB: [birth year]  Age: 71 y.o. MRN#: [id number] Attending Physician: Eugenie Filler, MD Primary Care Physician: Christain Sacramento, MD Admit Date: 09/26/2016  Reason for Consultation/Follow-up: Establishing goals of care, Non pain symptom management and Pain control  Subjective: I saw and examined Cathy Lewis. The laxatives have caughter up with her and she has been up and down to the [facility] this morning. She was started on Duragesic 12.5 mcg on 8/6, she had procedures yesterday- Celiac NL, endoscopy. She has been on PRN PCA which she has used minimal doses. Length of Stay: 5  Current Medications: Scheduled Meds:  . Alcohol  50 mL Intracatheter Once  . amLODipine  5 mg Oral Daily  . dexamethasone  8 mg Intravenous Q24H  . fentaNYL  12.5 mcg Transdermal Q72H  . fluticasone  1 spray Each Nare Daily  . HYDROmorphone   Intravenous Q4H  . lipase/protease/amylase  36,000 Units Oral TID AC  . metoCLOPramide (REGLAN) injection  10 mg Intravenous Q8H  . mometasone-formoterol  2 puff Inhalation BID  . montelukast  10 mg Oral QHS  . pantoprazole  40 mg Oral BID  . senna  2 tablet Oral BID  . sucralfate  1 g Oral TID WC & HS    Continuous Infusions: . ceFEPime (MAXIPIME) IV Stopped (10/01/16 0540)  . dextrose 5 % and 0.9% NaCl 75 mL/hr at 09/29/16 2236    PRN Meds: albuterol, bisacodyl, dicyclomine, diphenhydrAMINE **OR** diphenhydrAMINE, docusate sodium, HYDROmorphone (DILAUDID) injection, iopamidol, metoCLOPramide, naloxone **AND** sodium chloride flush, ondansetron (ZOFRAN) IV  Physical Exam   Alert, Mental status is good. Skin is warm and no redness or rashes, slightly tender across abdomen.        Vital Signs: BP (!)  112/59 (BP Location: Left Arm)   Pulse 79   Temp 98 F (36.7 C) (Oral)   Resp 14   Ht 5\' 4"  (1.626 m)   Wt 54.8 kg (120 lb 13 oz)   SpO2 98%   BMI 20.74 kg/m  SpO2: SpO2: 98 % O2 Device: O2 Device: Not Delivered O2 Flow Rate: O2 Flow Rate (L/min): 0 L/min  Intake/output summary:   Intake/Output Summary (Last 24 hours) at 10/01/16 0768 Last data filed at  10/01/16 0600  Gross per 24 hour  Intake             1750 ml  Output                1 ml  Net             1749 ml   LBM: Last BM Date: 09/30/16 Baseline Weight: Weight: 53.5 kg (118 lb) Most recent weight: Weight: 54.8 kg (120 lb 13 oz)       Palliative Assessment/Data:      Patient Active Problem List   Diagnosis Date Noted  . Acute gastric ulcer without hemorrhage or perforation   . Metastatic renal cell carcinoma ([place])   . Uncontrolled pain 09/26/2016  . Acute lower UTI 09/26/2016  . Leukopenia due to antineoplastic chemotherapy ([place]) 09/26/2016  . Chronic left-sided thoracic back pain   . Abdominal pain 09/21/2016  . Anemia 09/21/2016  . Abdominal pain, epigastric   . Chronic constipation   . Presbyopia of both eyes 03/17/2016  . GERD (gastroesophageal reflux disease) 10/09/2015  . Cough variant asthma 05/02/2015  . Metastatic renal cell carcinoma to bone ([place]) 11/20/2014  . Malignant neoplasm metastatic to bone ([place]) 08/29/2014  . H/O therapeutic radiation 08/29/2014  . Carcinoma of kidney ([place]) 08/29/2014  . LBP (low back pain) 08/29/2014  . Atypical chest pain   . Dyslipidemia   . Hypertension   . Syncope     Palliative Care Assessment & Plan   Patient Profile: 71 yo with metastatic Renal Cell Cancer, newly diagnosed Gastric adenocarcinoma followed and treated by [name], she is on daily oral chemotherapy Cabometyx. She has been seen 5 times in th ED for uncontrolled pain and other symptoms in the setting of advanced cancer. She reports worsening symptoms for a month- severe pain in back, pain  down her legs -difficulty getting comfortable in general. Not eating well. No BM in 4 days, feels full only passing bits of stool, but no vomiting. Was sent home on low dose oral dilaudid- has not had adequate pain relief.  Palliative consulted for symptom management and to begin discussion on goals of care.  Recommendations/Plan:  Pain: Reports pain currently well controlled.  Stop PCA today. Maintain 12.5 Duragesic, will order low dose hydromorphone IR for breakthrough.  Constipation: Frequent BM, holding laxative for now.  Goals of care: She is hopeful for treatment, and also to go home ASAP.  Goals of Care and Additional Recommendations:  Limitations on Scope of Treatment: Full Scope Treatment  Code Status:    Code Status Orders        Start     Ordered   09/26/16 1934  Full code  Continuous     09/26/16 1933    Code Status History    Date Active Date Inactive Code Status Order ID Comments User Context   09/21/2016  4:29 PM 09/22/2016  8:51 PM Full Code [id number]  Jani Gravel, MD ED   03/11/2011  5:56 PM 03/14/2011  3:47 PM Full Code [id number]  Iantha Fallen, RN Inpatient       Prognosis:   Unable to determine  Discharge Planning: Patient wants to go home- if she does ok off PCA I think home tomorrow would be fine- she is doing SO much better than when we admitted her last week.  Care plan was discussed with patient  Thank you for allowing the Palliative Medicine Team to assist in the care of this patient.  Total  time: 30 minutes Greater than 50%  of this time was spent counseling and coordinating care related to the above assessment and plan.  Patton Rabinovich, DO  Please contact Palliative Medicine Team phone at [phone] for questions and concerns.

## 2016-10-01 NOTE — Care Management Important Message (Signed)
Important Message  Patient Details  Name: Cathy Lewis MRN: [id number] Date of Birth: Feb 20, 1946   Medicare Important Message Given:  Yes    Kerin Salen 10/01/2016, 11:14 AMImportant Message  Patient Details  Name: Cathy Lewis MRN: [id number] Date of Birth: 15-Sep-1945   Medicare Important Message Given:  Yes    Kerin Salen 10/01/2016, 11:14 AM

## 2016-10-02 ENCOUNTER — Inpatient Hospital Stay (HOSPITAL_COMMUNITY): Payer: Medicare PPO

## 2016-10-02 DIAGNOSIS — G893 Neoplasm related pain (acute) (chronic): Secondary | ICD-10-CM

## 2016-10-02 LAB — URINALYSIS, ROUTINE W REFLEX MICROSCOPIC
BILIRUBIN URINE: NEGATIVE
Glucose, UA: NEGATIVE mg/dL
Hgb urine dipstick: NEGATIVE
Ketones, ur: NEGATIVE mg/dL
LEUKOCYTES UA: NEGATIVE
NITRITE: NEGATIVE
PH: 6 (ref 5.0–8.0)
Protein, ur: NEGATIVE mg/dL
SPECIFIC GRAVITY, URINE: 1.012 (ref 1.005–1.030)

## 2016-10-02 LAB — CBC WITH DIFFERENTIAL/PLATELET
BASOS ABS: 0 10*3/uL (ref 0.0–0.1)
Basophils Relative: 0 %
EOS ABS: 0 10*3/uL (ref 0.0–0.7)
Eosinophils Relative: 0 %
HEMATOCRIT: 32.1 % — AB (ref 36.0–46.0)
HEMOGLOBIN: 10.6 g/dL — AB (ref 12.0–15.0)
LYMPHS PCT: 20 %
Lymphs Abs: 0.9 10*3/uL (ref 0.7–4.0)
MCH: 31.3 pg (ref 26.0–34.0)
MCHC: 33 g/dL (ref 30.0–36.0)
MCV: 94.7 fL (ref 78.0–100.0)
MONOS PCT: 11 %
Monocytes Absolute: 0.5 10*3/uL (ref 0.1–1.0)
NEUTROS PCT: 69 %
Neutro Abs: 3.2 10*3/uL (ref 1.7–7.7)
Platelets: 189 10*3/uL (ref 150–400)
RBC: 3.39 MIL/uL — AB (ref 3.87–5.11)
RDW: 17.8 % — ABNORMAL HIGH (ref 11.5–15.5)
WBC: 4.6 10*3/uL (ref 4.0–10.5)

## 2016-10-02 LAB — BASIC METABOLIC PANEL
ANION GAP: 8 (ref 5–15)
BUN: 22 mg/dL — ABNORMAL HIGH (ref 6–20)
CALCIUM: 7.4 mg/dL — AB (ref 8.9–10.3)
CO2: 23 mmol/L (ref 22–32)
Chloride: 108 mmol/L (ref 101–111)
Creatinine, Ser: 0.85 mg/dL (ref 0.44–1.00)
GLUCOSE: 106 mg/dL — AB (ref 65–99)
POTASSIUM: 3.2 mmol/L — AB (ref 3.5–5.1)
Sodium: 139 mmol/L (ref 135–145)

## 2016-10-02 MED ORDER — HYDROMORPHONE HCL-NACL 0.5-0.9 MG/ML-% IV SOSY
0.5000 mg | PREFILLED_SYRINGE | INTRAVENOUS | Status: DC | PRN
  Filled 2016-10-02: qty 1

## 2016-10-02 MED ORDER — HYDROMORPHONE HCL-NACL 0.5-0.9 MG/ML-% IV SOSY
1.0000 mg | PREFILLED_SYRINGE | INTRAVENOUS | Status: DC | PRN
Start: 2016-10-02 — End: 2016-10-05
  Administered 2016-10-02 – 2016-10-05 (×6): 1 mg via INTRAVENOUS
  Filled 2016-10-02 (×6): qty 2

## 2016-10-02 MED ORDER — GI COCKTAIL ~~LOC~~
30.0000 mL | Freq: Three times a day (TID) | ORAL | Status: DC | PRN
  Administered 2016-10-02: 30 mL via ORAL
  Filled 2016-10-02: qty 30

## 2016-10-02 MED ORDER — POTASSIUM CHLORIDE CRYS ER 20 MEQ PO TBCR
40.0000 meq | EXTENDED_RELEASE_TABLET | ORAL | Status: AC
  Administered 2016-10-02 (×2): 40 meq via ORAL
  Filled 2016-10-02 (×2): qty 2

## 2016-10-02 MED ORDER — TRAZODONE HCL 50 MG PO TABS
50.0000 mg | ORAL_TABLET | Freq: Every day | ORAL | Status: DC
  Administered 2016-10-02 – 2016-10-06 (×5): 50 mg via ORAL
  Filled 2016-10-02 (×5): qty 1

## 2016-10-02 MED ORDER — LIDOCAINE 5 % EX PTCH
2.0000 | MEDICATED_PATCH | CUTANEOUS | Status: DC
  Administered 2016-10-02: 2 via TRANSDERMAL
  Filled 2016-10-02 (×2): qty 2

## 2016-10-02 MED ORDER — FAMOTIDINE 20 MG PO TABS
40.0000 mg | ORAL_TABLET | Freq: Every day | ORAL | Status: DC
  Administered 2016-10-02 – 2016-10-06 (×5): 40 mg via ORAL
  Filled 2016-10-02 (×5): qty 2

## 2016-10-02 MED ORDER — HYDROMORPHONE HCL-NACL 0.5-0.9 MG/ML-% IV SOSY
1.0000 mg | PREFILLED_SYRINGE | Freq: Once | INTRAVENOUS | Status: AC
  Administered 2016-10-02: 1 mg via INTRAVENOUS
  Filled 2016-10-02: qty 2

## 2016-10-02 MED ORDER — FENTANYL 25 MCG/HR TD PT72
25.0000 ug | MEDICATED_PATCH | TRANSDERMAL | Status: DC
Start: 2016-10-02 — End: 2016-10-06
  Administered 2016-10-02 – 2016-10-04 (×2): 25 ug via TRANSDERMAL
  Filled 2016-10-02 (×2): qty 1

## 2016-10-02 NOTE — Progress Notes (Signed)
Unfortunately, this abdominal and back pain are back. This seemed to start yesterday. Seemed to get worse as the day went on. She did not sleep much. Her abdomen is more distended. She denied any of that much. She tried to walk but the pain became very difficult to tolerate.  This is very distressing to me. I really thought that the celiac block would help.  I find it no coincidence that her abdominal distention is more prominent and her pain is worse. When I listen to her abdomen, her bowel sounds are fairly active. I just wonder if she is developing some type of pseudoobstruction and that she has involvement of the neuronal plexus that control her intestinal propulsion.  There is no fever. There is no bleeding. She's had no diarrhea.  I would think that her urinary tract infection should be cleared up by now.  She is now on a fentanyl patch. She is on some Dilaudid. These may need to be adjusted.  I really thought that she was going to be able to go home today. There is just no way for her to be able to go home with this kind of discomfort.  [name], MD  Psalm 118:6-7

## 2016-10-02 NOTE — Progress Notes (Signed)
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 Patient with frequents ED visit due to severe , uncontrolled pain. She report also poor oral intake. She is complaining of back pain, and abdominal pain. Denies vomiting, but she has not been able to eat. She has not had a BM in days. She has pass flatus. She is suppose to be taking antibiotics for UTI, antibiotics prescribe by PCP.  She denies dysuria, chest pain or dyspnea.   ED Course:Patient presents complaining of severe pain, leukopenia with WBC at 3.3, Hb at 11, K at 3.6, CO2; 20, UA with too numerous to count WBC. KUB non obstructive bowel pattern.     Assessment & Plan:   Principal Problem:   Metastatic renal cell carcinoma (HCC) Active Problems:   Hypertension   Metastatic renal cell carcinoma to bone (HCC)   Malignant neoplasm metastatic to bone (HCC)   Carcinoma of kidney (HCC)   Chronic constipation   Abdominal pain   Uncontrolled pain   Acute lower UTI   Leukopenia due to antineoplastic chemotherapy ([place])   Acute gastric ulcer without hemorrhage or perforation  1-Metastatic Renal cell carcinoma, new Diagnosis of Gastric cancer; ? Vs metastasis to stomach , Severe uncontrolled pain, back and abdomen; Pain likely secondary to malignancy.  Appreciate Dr Delia Chimes Hilma Favors  help with pain management.  IV Protonix BID.  Continue with Carafate.  on PCA pump. PCA pump discontinued and patient transitioned to oral pain regimen per palliative care for pain control in anticipation of discharge. Patient placed on a fentanyl patch. IV decadron.  Dr Marin Olp recommended celiac  plexus block which patient underwent. Patient back in pain. GI consulted for repeat endoscopy, gastric biopsy. Gastric biopsy from 09/22/2016 positive for adenocarcinoma. Patient underwent endoscopy for repat biopsy 8-07. Patient noted to have a gastric ulcer and erosive gastropathy. Continue PPI. Patient placed on Creon.  2-Abdominal Distension. Constipation. Resolved.  Suppository ordered.  KUB negative for obstruction.  IV fluids.  Having BM  Started on IV reglan  3-Poor oral Intake;  IV fluids.  Tolerating regular diet  Pain poorly controlled today She has been eating more.   4-UTI; Patient was suppose to be taking antibiotics for UTI.  Last Urine culture on July grew Pseudomonas.  Urine culture with 40,000 colonies of pseudomonas aeruginosa.  S/p 5 days of cefepime.   5-Leukopenia; in setting of chemo.  Treat UTI.  Monitor for fever.  resolved.   6. Gastric ulcer/erosive gastropathy Per upper endoscopy. Continue PPI. Per GI. Biopsies from prior gastric ulcer of 09/22/2016 positive for adenocarcinoma.    DVT prophylaxis: SCDs Code Status: Full Family Communication: Updated patient. No family at bedside. Disposition Plan: Home once pain management has improved hopefully in the next 24-48 hours.   Consultants:   Oncology: [name] 09/27/2016  Palliative care: Dr.Golding 09/26/2016  Procedures:   Upper endoscopy 09/30/2016--Dr. Carlean Purl  CT abdomen and pelvis 09/25/2016  CT-guided neurolytic ablation of the celiac axis per Dr. Jarvis Newcomer 09/30/2016  Abdominal x-ray 09/26/2016, 09/25/2016, 10/02/2016  Antimicrobials:   IV cefepime 09/26/2016>>>>> 10/01/2016   Subjective: Patient complaining of right upper quadrant pain. Patient denies any nausea or emesis.  Objective: Vitals:  10/01/16 2026 10/01/16 2119 10/02/16 0519 10/02/16 1044  BP: 112/60  (!) 106/54 123/60  Pulse: 83  76   Resp: 16  17   Temp: 98.6 F (37 C)  98.6 F (37 C)   TempSrc:  Oral  Oral   SpO2: 98% 97% 98%   Weight:   54.8 kg (120 lb 12 oz)   Height:        Intake/Output Summary (Last 24 hours) at 10/02/16 1214 Last data filed at 10/02/16 1011  Gross per 24 hour  Intake              730 ml  Output                0 ml  Net              730 ml   Filed Weights   09/30/16 0530 10/01/16 0500 10/02/16 0519  Weight: 55 kg (121 lb 4.1 oz) 54.8 kg (120 lb 13 oz) 54.8 kg (120 lb 12 oz)    Examination:  General exam: In pain. Respiratory system: Clear to auscultation anterior lung fields. Respiratory effort normal. Cardiovascular system: S1 & S2 heard, RRR. No JVD, murmurs, rubs, gallops or clicks. No pedal edema. Gastrointestinal system: Abdomen is nondistended, soft and tender to palpation in the right upper quadrant. No organomegaly or masses felt. Normal bowel sounds heard. Central nervous system: Alert and oriented. No focal neurological deficits. Extremities: Symmetric 5 x 5 power. Skin: No rashes, lesions or ulcers Psychiatry: Judgement and insight appear normal. Mood & affect appropriate.     Data Reviewed: I have personally reviewed following labs and imaging studies  CBC:  Recent Labs Lab 09/27/16 0407 09/28/16 0353 09/29/16 0421 10/01/16 0343 10/02/16 0334  WBC 2.9* 4.1 5.3 5.4 4.6  NEUTROABS  --   --   --   --  3.2  HGB 10.6* 10.4* 10.4* 10.8* 10.6*  HCT 32.8* 32.5* 32.6* 34.0* 32.1*  MCV 97.6 97.6 99.4 96.6 94.7  PLT 192 200 228 214 756   Basic Metabolic Panel:  Recent Labs Lab 09/27/16 0407 09/28/16 0353 09/29/16 0421 10/01/16 0343 10/02/16 0334  NA 138 141 142 139 139  K 4.1 3.8 4.7 3.9 3.2*  CL 108 109 106 105 108  CO2 20* 25 27 27 23   GLUCOSE 157* 174* 134* 122* 106*  BUN 9 9 9 18  22*  CREATININE 0.90 0.97 0.90 0.90 0.85  CALCIUM 7.8* 7.6* 8.1* 7.3* 7.4*   GFR: Estimated Creatinine Clearance: 50.9 mL/min (by C-G formula based on SCr of 0.85 mg/dL). Liver Function Tests:  Recent Labs Lab 09/26/16 1121  AST 29    ALT 22  ALKPHOS 52  BILITOT 0.4  PROT 6.1*  ALBUMIN 3.5    Recent Labs Lab 09/26/16 1121  LIPASE 23   No results for input(s): AMMONIA in the last 168 hours. Coagulation Profile: No results for input(s): INR, PROTIME in the last 168 hours. Cardiac Enzymes: No results for input(s): CKTOTAL, CKMB, CKMBINDEX, TROPONINI in the last 168 hours. BNP (last 3 results) No results for input(s): PROBNP in the last 8760 hours. HbA1C: No results for input(s): HGBA1C in the last 72 hours. CBG: No results for input(s): GLUCAP in the last 168 hours. Lipid Profile: No results for input(s): CHOL, HDL, LDLCALC, TRIG, CHOLHDL, LDLDIRECT in the last 72 hours. Thyroid Function Tests: No results for input(s): TSH, T4TOTAL, FREET4, T3FREE, THYROIDAB in the last 72 hours. Anemia Panel: No results for input(s): VITAMINB12, FOLATE,  FERRITIN, TIBC, IRON, RETICCTPCT in the last 72 hours. Sepsis Labs: No results for input(s): PROCALCITON, LATICACIDVEN in the last 168 hours.  Recent Results (from the past 240 hour(s))  Urine Culture     Status: Abnormal   Collection Time: 09/26/16  3:22 PM  Result Value Ref Range Status   Specimen Description URINE, RANDOM  Final   Special Requests NONE  Final   Culture 40,000 COLONIES/mL PSEUDOMONAS AERUGINOSA (A)  Final   Report Status 09/29/2016 FINAL  Final   Organism ID, Bacteria PSEUDOMONAS AERUGINOSA (A)  Final      Susceptibility   Pseudomonas aeruginosa - MIC*    CEFTAZIDIME 4 SENSITIVE Sensitive     CIPROFLOXACIN <=0.25 SENSITIVE Sensitive     GENTAMICIN <=1 SENSITIVE Sensitive     IMIPENEM 1 SENSITIVE Sensitive     PIP/TAZO <=4 SENSITIVE Sensitive     CEFEPIME <=1 SENSITIVE Sensitive     * 40,000 COLONIES/mL PSEUDOMONAS AERUGINOSA         Radiology Studies: Ct Biopsy  Result Date: Oct 07, 2016 CLINICAL DATA:  metastatic renal cell cancer to bone and newly diagnosed gastric cancer. Biopsy done on 7/30 pathology revealed adenocarcinoma. She is  currently on Chemotherapy. CT scan 7/27 showed pulmonary,bone, liver and adrenal metastasis. She has frequent ED visit due to severe, uncontrolled pain. She complains of abdominal pain that goes through to her back. We are asked to celiac block for pain control. EXAM: CT GUIDED NEUROLYTIC ABLATION OF THE CELIAC AXIS ANESTHESIA/SEDATION: Intravenous Fentanyl and Versed were administered as conscious sedation during continuous monitoring of the patient's level of consciousness and physiological / cardiorespiratory status by the radiology RN, with a total moderate sedation time of 48 minutes. PROCEDURE: The procedure risks, benefits, and alternatives were explained to the patient. Questions regarding the procedure were encouraged and answered. The patient understands and consents to the procedure. The anterior abdominal wall was prepped with chlorhexidine in a sterile fashion, and a sterile drape was applied covering the operative field. A sterile gown and sterile gloves were used for the procedure. Local anesthesia was provided with 1% Lidocaine. A 22 gauge Chiba needle was advanced under CT guidance from a left anterolateral approach to the level of the celiac plexus. After confirming and adjusting needle tip position, approximately three ml of a 1:10 dilution of Omnipaque-300 contrast and lidocaine 1% was injected. Spread of diluted contrast material to the left of midline was confirmed by CT. In similar fashion, from a right parasagittal approach a 22 gauge Chiba needle was advanced under CT guidance to the level of the celiac plexus. After confirming needle tip position, approximately three ml of a 1:10 dilution of Omnipaque-300 contrast and lidocaine 1% was injected. Spread of diluted contrast material to the right of midline was confirmed by CT. Injection of 20 mL lidocaine 1% was then divided between 2 sites. Patient was observed for 15 minutes at number no adverse complications. Alcohol ablation of the celiac  plexus was then performed with injection of 40 ml of absolute alcohol divided between the 2 sites. CT confirms appropriate alcohol distribution, no bleeding or other apparent complication. COMPLICATIONS: None FINDINGS: Contrast injection showed excellent spread bilaterally at the level of the celiac plexus. Alcohol ablation was successfully performed. The patient tolerated the procedure well. IMPRESSION: 1. Technically successful CT guided neurolytic ablation of the celiac plexus as above. Electronically Signed   By: Lucrezia Europe M.D.   On: 10/07/16 14:52   Dg Abd Portable 2v  Result Date: 10/02/2016 CLINICAL  DATA:  Abdominal pain EXAM: PORTABLE ABDOMEN - 2 VIEW COMPARISON:  09/25/2016 CT FINDINGS: The bowel gas pattern is normal. No abnormal mass effect or gas collection. No evidence of pneumoperitoneum. Known pulmonary nodules. Patient has history of metastatic renal cell carcinoma. L1 pathologic fracture with levoscoliosis. IMPRESSION: 1. No acute finding. Normal bowel gas pattern and no evidence of pneumoperitoneum. 2. Known bony and pulmonary metastatic disease. Electronically Signed   By: Monte Fantasia M.D.   On: 10/02/2016 09:35        Scheduled Meds: . Alcohol  50 mL Intracatheter Once  . amLODipine  5 mg Oral Daily  . dexamethasone  4 mg Intravenous Q24H  . fentaNYL  12.5 mcg Transdermal Q72H  . fluticasone  1 spray Each Nare Daily  . lipase/protease/amylase  36,000 Units Oral TID AC  . metoCLOPramide (REGLAN) injection  5 mg Intravenous Q8H  . mometasone-formoterol  2 puff Inhalation BID  . pantoprazole  40 mg Oral BID  . potassium chloride  40 mEq Oral Q4H  . senna  2 tablet Oral Daily  . sucralfate  1 g Oral TID WC & HS   Continuous Infusions: . dextrose 5 % and 0.9% NaCl 75 mL/hr at 10/02/16 0018     LOS: 6 days    Time spent: 53 minutes    Natash Berman, MD [facility] Pager [phone]  If 7PM-7AM, please contact  night-coverage [email or web address] Password [email or web address] 10/02/2016, 12:14 PM

## 2016-10-02 NOTE — Care Management Note (Signed)
Case Management Note  Patient Details  Name: Cathy Lewis MRN: [id number] Date of Birth: [birth year]  Subjective/Objective:71 y/o f admitted w/Met renal cell ca. From  Home.New dx gastric ca. Oncology following.                    Action/Plan:d/c plan home.   Expected Discharge Date:   (unknown)               Expected Discharge Plan:  Home/Self Care  In-House Referral:     Discharge planning Services  CM Consult  Post Acute Care Choice:    Choice offered to:     DME Arranged:    DME Agency:     HH Arranged:    HH Agency:     Status of Service:  In process, will continue to follow  If discussed at Long Length of Stay Meetings, dates discussed:    Additional Comments:  Dessa Phi, RN 10/02/2016, 1:51 PM

## 2016-10-02 NOTE — Care Management Note (Signed)
Case Management Note  Patient Details  Name: Cathy Lewis MRN: [id number] Date of Birth: 12/03/1945  Subjective/Objective:  Referral to provide palliative based service resource-provided to patient-patient voiced understanding.                Action/Plan:d/c plan home.   Expected Discharge Date:   (unknown)               Expected Discharge Plan:  Home/Self Care  In-House Referral:     Discharge planning Services  CM Consult  Post Acute Care Choice:    Choice offered to:     DME Arranged:    DME Agency:     HH Arranged:    HH Agency:     Status of Service:  In process, will continue to follow  If discussed at Long Length of Stay Meetings, dates discussed:    Additional Comments:  Dessa Phi, RN 10/02/2016, 3:45 PM

## 2016-10-03 DIAGNOSIS — D701 Agranulocytosis secondary to cancer chemotherapy: Secondary | ICD-10-CM

## 2016-10-03 DIAGNOSIS — T451X5A Adverse effect of antineoplastic and immunosuppressive drugs, initial encounter: Secondary | ICD-10-CM

## 2016-10-03 DIAGNOSIS — M545 Low back pain: Secondary | ICD-10-CM

## 2016-10-03 LAB — CBC WITH DIFFERENTIAL/PLATELET
Basophils Absolute: 0 10*3/uL (ref 0.0–0.1)
Basophils Relative: 0 %
Eosinophils Absolute: 0 10*3/uL (ref 0.0–0.7)
Eosinophils Relative: 0 %
HCT: 34.1 % — ABNORMAL LOW (ref 36.0–46.0)
Hemoglobin: 10.9 g/dL — ABNORMAL LOW (ref 12.0–15.0)
LYMPHS ABS: 1.2 10*3/uL (ref 0.7–4.0)
LYMPHS PCT: 23 %
MCH: 31.1 pg (ref 26.0–34.0)
MCHC: 32 g/dL (ref 30.0–36.0)
MCV: 97.2 fL (ref 78.0–100.0)
MONOS PCT: 4 %
Monocytes Absolute: 0.2 10*3/uL (ref 0.1–1.0)
Neutro Abs: 3.9 10*3/uL (ref 1.7–7.7)
Neutrophils Relative %: 73 %
PLATELETS: 220 10*3/uL (ref 150–400)
RBC: 3.51 MIL/uL — AB (ref 3.87–5.11)
RDW: 18.1 % — ABNORMAL HIGH (ref 11.5–15.5)
WBC: 5.3 10*3/uL (ref 4.0–10.5)

## 2016-10-03 LAB — COMPREHENSIVE METABOLIC PANEL
ALBUMIN: 3.2 g/dL — AB (ref 3.5–5.0)
ALT: 39 U/L (ref 14–54)
AST: 34 U/L (ref 15–41)
Alkaline Phosphatase: 43 U/L (ref 38–126)
Anion gap: 8 (ref 5–15)
BUN: 13 mg/dL (ref 6–20)
CHLORIDE: 111 mmol/L (ref 101–111)
CO2: 21 mmol/L — AB (ref 22–32)
CREATININE: 0.83 mg/dL (ref 0.44–1.00)
Calcium: 7.2 mg/dL — ABNORMAL LOW (ref 8.9–10.3)
GFR calc Af Amer: >60 mL/min (ref >60)
GFR calc non Af Amer: >60 mL/min (ref >60)
GLUCOSE: 123 mg/dL — AB (ref 65–99)
POTASSIUM: 3.2 mmol/L — AB (ref 3.5–5.1)
SODIUM: 140 mmol/L (ref 135–145)
Total Bilirubin: 0.3 mg/dL (ref 0.3–1.2)
Total Protein: 5.6 g/dL — ABNORMAL LOW (ref 6.5–8.1)

## 2016-10-03 MED ORDER — LOPERAMIDE HCL 2 MG PO CAPS
2.0000 mg | ORAL_CAPSULE | Freq: Once | ORAL | Status: AC
  Administered 2016-10-03: 2 mg via ORAL
  Filled 2016-10-03: qty 1

## 2016-10-03 MED ORDER — DEXAMETHASONE 4 MG PO TABS
2.0000 mg | ORAL_TABLET | Freq: Every day | ORAL | Status: DC
  Administered 2016-10-03 – 2016-10-06 (×4): 2 mg via ORAL
  Filled 2016-10-03 (×4): qty 1

## 2016-10-03 MED ORDER — ENSURE ENLIVE PO LIQD
237.0000 mL | Freq: Two times a day (BID) | ORAL | Status: DC
  Administered 2016-10-03 – 2016-10-05 (×4): 237 mL via ORAL

## 2016-10-03 MED ORDER — HYDROMORPHONE HCL 4 MG PO TABS
4.0000 mg | ORAL_TABLET | ORAL | Status: DC | PRN
  Administered 2016-10-03 – 2016-10-06 (×9): 4 mg via ORAL
  Filled 2016-10-03 (×9): qty 1

## 2016-10-03 MED ORDER — LIDOCAINE 5 % EX PTCH
3.0000 | MEDICATED_PATCH | CUTANEOUS | Status: DC
  Administered 2016-10-03 – 2016-10-06 (×4): 3 via TRANSDERMAL
  Filled 2016-10-03 (×6): qty 3

## 2016-10-03 MED ORDER — BISACODYL 5 MG PO TBEC: 5.0000 mg | DELAYED_RELEASE_TABLET | Freq: Every day | ORAL | Status: DC | PRN

## 2016-10-03 NOTE — Progress Notes (Signed)
Daily Progress Note   Patient Name: Cathy Lewis       Date: 10/03/2016 DOB: [birth year]  Age: 71 y.o. MRN#: [id number] Attending Physician: Cathy Filler, MD Primary Care Physician: Cathy Sacramento, MD Admit Date: 09/26/2016  Reason for Consultation/Follow-up: Establishing goals of care, Non pain symptom management and Pain control  Subjective: I saw and examined Cathy Lewis, her husband is at the bedside. She continues to have moderate to severe pain in her right lower quadrant. She reports frequent and very loose stools. No nausea. She has been OOB, she is doing all of her own ADLs and looks very strong today, but she is mildly flushed in the face and has ovious pain.  Length of Stay: 7  Current Medications: Scheduled Meds:  . Alcohol  50 mL Intracatheter Once  . dexamethasone  2 mg Oral Daily  . famotidine  40 mg Oral QHS  . fentaNYL  25 mcg Transdermal Q48H  . fluticasone  1 spray Each Nare Daily  . lidocaine  3 patch Transdermal Q24H  . loperamide  2 mg Oral Once  . mometasone-formoterol  2 puff Inhalation BID  . pantoprazole  40 mg Oral BID  . sucralfate  1 g Oral TID WC & HS  . traZODone  50 mg Oral QHS    Continuous Infusions: . dextrose 5 % and 0.9% NaCl 75 mL/hr at 10/03/16 0211    PRN Meds: albuterol, bisacodyl, HYDROmorphone (DILAUDID) injection, HYDROmorphone, metoCLOPramide  Physical Exam   Alert, Mental status is good. Skin is warm and no redness or rashes, slightly tender across abdomen.        Vital Signs: BP (!) 109/92 (BP Location: Left Arm)   Pulse 84   Temp 98.6 F (37 C) (Oral)   Resp 18   Ht 5\' 4"  (1.626 m)   Wt 56.2 kg (124 lb)   SpO2 98%   BMI 21.28 kg/m  SpO2: SpO2: 98 % O2 Device: O2 Device: Not Delivered O2 Flow Rate: O2 Flow Rate  (L/min): 0 L/min  Intake/output summary:   Intake/Output Summary (Last 24 hours) at 10/03/16 1143 Last data filed at 10/03/16 0842  Gross per 24 hour  Intake             1065 ml  Output  200 ml  Net              865 ml   LBM: Last BM Date: 10/02/16 Baseline Weight: Weight: 53.5 kg (118 lb) Most recent weight: Weight: 56.2 kg (124 lb)       Palliative Assessment/Data:      Patient Active Problem List   Diagnosis Date Noted  . Chronic pain due to neoplasm   . Acute gastric ulcer without hemorrhage or perforation   . Metastatic renal cell carcinoma ([place])   . Uncontrolled pain 09/26/2016  . Acute lower UTI 09/26/2016  . Leukopenia due to antineoplastic chemotherapy ([place]) 09/26/2016  . Chronic left-sided thoracic back pain   . Abdominal pain 09/21/2016  . Anemia 09/21/2016  . Abdominal pain, epigastric   . Chronic constipation   . Presbyopia of both eyes 03/17/2016  . GERD (gastroesophageal reflux disease) 10/09/2015  . Cough variant asthma 05/02/2015  . Metastatic renal cell carcinoma to bone ([place]) 11/20/2014  . Malignant neoplasm metastatic to bone ([place]) 08/29/2014  . H/O therapeutic radiation 08/29/2014  . Carcinoma of kidney ([place]) 08/29/2014  . LBP (low back pain) 08/29/2014  . Atypical chest pain   . Dyslipidemia   . Hypertension   . Syncope     Palliative Care Assessment & Plan   Patient Profile: 71 yo with metastatic Renal Cell Cancer, newly diagnosed Gastric adenocarcinoma followed and treated by Dr. Marin Lewis, she is on daily oral chemotherapy Cabometyx. She has been seen 5 times in th ED for uncontrolled pain and other symptoms in the setting of advanced cancer. She reports worsening symptoms for a month- severe pain in back, pain down her legs -difficulty getting comfortable in general. Not eating well. Palliative consulted for symptom management and to begin discussion on goals of care.  Recommendations/Plan:  Pain: celiac block helped with  epigastric pain but now pain mostly in right lower quadrant and flank. Increased fentanyl yesterday to 25-has tolerated this well but still requiring oral and IV dilaudid frequently. Increased dilaudid oral to 4mg . Will continue to titrate as needed.   Constipation: now having diarrhea, frequent BM, stopped creon, holding laxative for now.  Goals of care: She is hopeful for treatment, and also to go home ASAP.  Goals of Care and Additional Recommendations:  Limitations on Scope of Treatment: Full Scope Treatment  Code Status:    Code Status Orders        Start     Ordered   09/26/16 1934  Full code  Continuous     09/26/16 1933    Code Status History    Date Active Date Inactive Code Status Order ID Comments User Context   09/21/2016  4:29 PM 09/22/2016  8:51 PM Full Code [id number]  Cathy Gravel, MD ED   03/11/2011  5:56 PM 03/14/2011  3:47 PM Full Code [id number]  Cathy Fallen, RN Inpatient       Prognosis:   Unable to determine  Discharge Planning: Patient wants to go home- pain is limiting issue  Care plan was discussed with patient, husband, hospitalist, RN  Thank you for allowing the Palliative Medicine Team to assist in the care of this patient.  Total time: 30 minutes Greater than 50%  of this time was spent counseling and coordinating care related to the above assessment and plan.  Cathy Pett, DO  Please contact Palliative Medicine Team phone at [phone] for questions and concerns.

## 2016-10-03 NOTE — Progress Notes (Signed)
Patient aware of cancer already - will forward to [name] - could not get other stains - not sure what else we could do I took many large biopsies. If there is still a need for more tissue let me know and will see what other options there are

## 2016-10-03 NOTE — Progress Notes (Signed)
Cathy Lewis might be a little more comfortable today. It looks like the fentanyl patch was increased. She has some Lidoderm patches on.  Her abdominal film did not show any obvious obstruction.  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 I suspect this probably is affecting her intrinsic intestinal nervous system. This can be very difficult to overcome.  The repeat biopsies came back poorly differentiated carcinoma. I have believe that this is renal cell cancer.  She did have some bowel movements yesterday. She did not walk.  Her abdomen does not look as distended. Bowel sounds might be a little more active.  There's been no fever. She's had no emesis.  A repeat urinalysis looked okay. I suspect that this Pseudomonas in the urine should be cleared out.  She really hates the beds. She said that they are very uncomfortable. This does not help respect her back pain. I know that she does have arthritic issues.  On her physical exam, her vital signs all are pretty stable. Her blood pressure is 109/92. Temperature is 98.6. Pulse is 84. Her lungs are clear. Cardiac exam regular rate and rhythm. Oral exam shows no thrush. Abdomen is slightly distended. Bowel sounds are better. There is no guarding or rebound tenderness. There is no fluid. It is slightly hypertympanic to percussion. Extremities shows no clubbing, cyanosis or edema.  I suspect that she probably is not yet ready for discharge. Hopefully, with these medication changes, she will be a little more comfortable.  I just have a bad feeling that would not go really make much headway with respect to her malignancy. We can certainly try treatment on her. It would be nice if she just had a better performance status.  We prayed together. She wanted me to pray with her. She does have a very strong faith.  [name], MD  Darlyn Chamber 31:16

## 2016-10-03 NOTE — Progress Notes (Signed)
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 Patient with frequents ED visit due to severe , uncontrolled pain. She report also poor oral intake. She is complaining of back pain, and abdominal pain. Denies vomiting, but she has not been able to eat. She has not had a BM in days. She has pass flatus. She is suppose to be taking antibiotics for UTI, antibiotics prescribe by PCP.  She denies dysuria, chest pain or dyspnea.   ED Course:Patient presents complaining of severe pain, leukopenia with WBC at 3.3, Hb at 11, K at 3.6, CO2; 20, UA with too numerous to count WBC. KUB non obstructive bowel pattern.     Assessment & Plan:   Principal Problem:   Metastatic renal cell carcinoma (HCC) Active Problems:   Hypertension   Metastatic renal cell carcinoma to bone (HCC)   Malignant neoplasm metastatic to bone (HCC)   Carcinoma of kidney (HCC)   Chronic constipation   Abdominal pain   Uncontrolled pain   Acute lower UTI   Leukopenia due to antineoplastic chemotherapy ([place])   Acute gastric ulcer without hemorrhage or perforation   Chronic pain due to neoplasm  1-Metastatic Renal cell carcinoma, new Diagnosis of Gastric cancer; ? Vs metastasis to stomach , Severe uncontrolled pain, back and abdomen; Pain likely secondary to malignancy.  Appreciate Dr Delia Chimes Hilma Favors  help with pain management.  IV Protonix BID.  Continue with Carafate.  on PCA pump. PCA pump discontinued and patient transitioned to oral pain regimen per palliative care for pain control in anticipation of discharge. Patient placed on a fentanyl patch. IV decadron.    Dr Marin Olp recommended celiac plexus block which patient underwent. Patient back in pain. GI consulted for repeat endoscopy, gastric biopsy. Gastric biopsy from 09/22/2016 positive for adenocarcinoma. Patient underwent endoscopy for repat biopsy 8-07. Patient noted to have a gastric ulcer and erosive gastropathy. Continue PPI. Patient placed on Creon. - Pain medication adjustments made by palliative care for better pain control.  2-Abdominal Distension. Constipation. Resolved.  Suppository ordered.  KUB negative for obstruction.  IV fluids.  Having BM however patient states multiple loops. DC IV Reglan and Senokot.  3-Poor oral Intake;  IV fluids.  Tolerating regular diet  Pain poorly controlled. She has been eating less due to fear due to fear of having multiple stools.Marland Kitchen   4-UTI; Patient was suppose to be taking antibiotics for UTI.  Last Urine culture on July grew Pseudomonas.  Urine culture with 40,000 colonies of pseudomonas aeruginosa.  S/p 5 days of cefepime.   5-Leukopenia; in setting of chemo.  Status post 5 days of cefepime for UTI. Patient afebrile. Leukopenia resolved.   6. Gastric ulcer/erosive gastropathy Per upper endoscopy. Continue PPI. Per GI. Biopsies from prior gastric ulcer of 09/22/2016 positive for adenocarcinoma. Repeat biopsies with poorly differentiated carcinoma. Oncology following.  7. Hypokalemia Likely secondary to GI losses. Replete.    DVT prophylaxis: SCDs Code Status: Full Family Communication: Updated patient and family at bedside. Disposition Plan: Home once pain management has improved hopefully in the next 24-48 hours.   Consultants:   Oncology: [name] 09/27/2016  Palliative care: Dr.Golding 09/26/2016  Procedures:   Upper  endoscopy 09/30/2016--Dr. Carlean Purl  CT abdomen and pelvis 09/25/2016  CT-guided neurolytic ablation of the celiac axis per Dr. Jarvis Newcomer 09/30/2016  Abdominal x-ray 09/26/2016, 09/25/2016,  10/02/2016  Antimicrobials:   IV cefepime 09/26/2016>>>>> 10/01/2016   Subjective: Patient complaining of right sided pain. [name] multiple loose stools. Tolerating oral intake. Patient states she is scared to eat because of multiple loose stools.  Objective: Vitals:   10/02/16 2258 10/03/16 0057 10/03/16 0435 10/03/16 0716  BP:   (!) 109/92   Pulse:   84   Resp:   18   Temp:   98.6 F (37 C)   TempSrc:   Oral   SpO2: 98%  99% 98%  Weight:  56.2 kg (124 lb)    Height:        Intake/Output Summary (Last 24 hours) at 10/03/16 1140 Last data filed at 10/03/16 0842  Gross per 24 hour  Intake             1065 ml  Output              200 ml  Net              865 ml   Filed Weights   10/01/16 0500 10/02/16 0519 10/03/16 0057  Weight: 54.8 kg (120 lb 13 oz) 54.8 kg (120 lb 12 oz) 56.2 kg (124 lb)    Examination:  General exam: In pain. Respiratory system: Clear to auscultation anterior lung fields. Respiratory effort normal. Cardiovascular system: S1 & S2 heard, RRR. No JVD, murmurs, rubs, gallops or clicks. No pedal edema. Gastrointestinal system: Abdomen is nondistended, soft and tender to palpation in the right upper quadrant and right side. No organomegaly or masses felt. Normal bowel sounds heard. Central nervous system: Alert and oriented. No focal neurological deficits. Extremities: Symmetric 5 x 5 power. Skin: No rashes, lesions or ulcers Psychiatry: Judgement and insight appear normal. Mood & affect appropriate.     Data Reviewed: I have personally reviewed following labs and imaging studies  CBC:  Recent Labs Lab 09/28/16 0353 09/29/16 0421 10/01/16 0343 10/02/16 0334 10/03/16 0820  WBC 4.1 5.3 5.4 4.6 5.3  NEUTROABS  --   --   --  3.2 3.9  HGB 10.4* 10.4* 10.8* 10.6* 10.9*  HCT 32.5* 32.6* 34.0* 32.1* 34.1*  MCV 97.6 99.4 96.6 94.7 97.2  PLT 200 228 214 189 157   Basic Metabolic Panel:  Recent Labs Lab 09/28/16 0353 09/29/16 0421  10/01/16 0343 10/02/16 0334 10/03/16 0820  NA 141 142 139 139 140  K 3.8 4.7 3.9 3.2* 3.2*  CL 109 106 105 108 111  CO2 25 27 27 23  21*  GLUCOSE 174* 134* 122* 106* 123*  BUN 9 9 18  22* 13  CREATININE 0.97 0.90 0.90 0.85 0.83  CALCIUM 7.6* 8.1* 7.3* 7.4* 7.2*   GFR: Estimated Creatinine Clearance: 52.1 mL/min (by C-G formula based on SCr of 0.83 mg/dL). Liver Function Tests:  Recent Labs Lab 10/03/16 0820  AST 34  ALT 39  ALKPHOS 43  BILITOT 0.3  PROT 5.6*  ALBUMIN 3.2*   No results for input(s): LIPASE, AMYLASE in the last 168 hours. No results for input(s): AMMONIA in the last 168 hours. Coagulation Profile: No results for input(s): INR, PROTIME in the last 168 hours. Cardiac Enzymes: No results for input(s): CKTOTAL, CKMB, CKMBINDEX, TROPONINI in the last 168 hours. BNP (last 3 results) No results for input(s): PROBNP in the last 8760 hours. HbA1C: No results for input(s):  HGBA1C in the last 72 hours. CBG: No results for input(s): GLUCAP in the last 168 hours. Lipid Profile: No results for input(s): CHOL, HDL, LDLCALC, TRIG, CHOLHDL, LDLDIRECT in the last 72 hours. Thyroid Function Tests: No results for input(s): TSH, T4TOTAL, FREET4, T3FREE, THYROIDAB in the last 72 hours. Anemia Panel: No results for input(s): VITAMINB12, FOLATE, FERRITIN, TIBC, IRON, RETICCTPCT in the last 72 hours. Sepsis Labs: No results for input(s): PROCALCITON, LATICACIDVEN in the last 168 hours.  Recent Results (from the past 240 hour(s))  Urine Culture     Status: Abnormal   Collection Time: 09/26/16  3:22 PM  Result Value Ref Range Status   Specimen Description URINE, RANDOM  Final   Special Requests NONE  Final   Culture 40,000 COLONIES/mL PSEUDOMONAS AERUGINOSA (A)  Final   Report Status 09/29/2016 FINAL  Final   Organism ID, Bacteria PSEUDOMONAS AERUGINOSA (A)  Final      Susceptibility   Pseudomonas aeruginosa - MIC*    CEFTAZIDIME 4 SENSITIVE Sensitive     CIPROFLOXACIN  <=0.25 SENSITIVE Sensitive     GENTAMICIN <=1 SENSITIVE Sensitive     IMIPENEM 1 SENSITIVE Sensitive     PIP/TAZO <=4 SENSITIVE Sensitive     CEFEPIME <=1 SENSITIVE Sensitive     * 40,000 COLONIES/mL PSEUDOMONAS AERUGINOSA         Radiology Studies: Dg Abd Portable 2v  Result Date: 10/02/2016 CLINICAL DATA:  Abdominal pain EXAM: PORTABLE ABDOMEN - 2 VIEW COMPARISON:  09/25/2016 CT FINDINGS: The bowel gas pattern is normal. No abnormal mass effect or gas collection. No evidence of pneumoperitoneum. Known pulmonary nodules. Patient has history of metastatic renal cell carcinoma. L1 pathologic fracture with levoscoliosis. IMPRESSION: 1. No acute finding. Normal bowel gas pattern and no evidence of pneumoperitoneum. 2. Known bony and pulmonary metastatic disease. Electronically Signed   By: Monte Fantasia M.D.   On: 10/02/2016 09:35        Scheduled Meds: . Alcohol  50 mL Intracatheter Once  . dexamethasone  2 mg Oral Daily  . famotidine  40 mg Oral QHS  . fentaNYL  25 mcg Transdermal Q48H  . fluticasone  1 spray Each Nare Daily  . lidocaine  3 patch Transdermal Q24H  . loperamide  2 mg Oral Once  . mometasone-formoterol  2 puff Inhalation BID  . pantoprazole  40 mg Oral BID  . sucralfate  1 g Oral TID WC & HS  . traZODone  50 mg Oral QHS   Continuous Infusions: . dextrose 5 % and 0.9% NaCl 75 mL/hr at 10/03/16 0211     LOS: 7 days    Time spent: 26 minutes    Giuliana Handyside, MD [facility] Pager [phone]  If 7PM-7AM, please contact night-coverage [email or web address] Password [email or web address] 10/03/2016, 11:40 AM

## 2016-10-03 NOTE — Progress Notes (Signed)
Initial Nutrition Assessment  DOCUMENTATION CODES:   Not applicable  INTERVENTION:   Ensure Enlive po BID, each supplement provides 350 kcal and 20 grams of protein   Discussed the importance of protein intake for preservation of lean body mass. Included protein supplement information in discharge instructions.   NUTRITION DIAGNOSIS:   Inadequate oral intake related to poor appetite as evidenced by meal completion < 50%.  GOAL:   Patient will meet greater than or equal to 90% of their needs  MONITOR:   PO intake, Supplement acceptance, Labs, Weight trends  REASON FOR ASSESSMENT:   LOS   ASSESSMENT:   Pt with PMH of dyslipidemia, HTN, metastatic renal cell carcinoma to the bone, and newly diagnosised gastric cancer with the presence of pulmonary, osseous, liver, and adrenal metastasis (currently on chemotherapy). Presents this admission with poor oral intake and uncontrolled back/abdomen pain.   LOS screen day 7 of admission. Spoke with pt regarding poor PO intake. Reports appetite comes and goes due to new onset diarrhea. States every time she eats, "it goes right through." Meal completion average is <50% for the last five days. Will provide pt with supplementation for increased calorie/protein needs. Will monitor for toleration given her diarrhea symptoms.   Wt remains stable this admission at around 120 lb. Pt reports this is her UBW.   Nutrition-Focused physical exam completed. Findings are mild fat depletion in upper arm, mild muscle depletion in bilateral lower extremites, and no edema. Pt does not show to be malnourished at this time. Discussed how to maintain her wt during admission and post discharge.   Medications reviewed and include: reglan, NS with D5 @ 75 ml/hr Labs reviewed: Na 3.2 (L) CO2 21 (L) CBG 106-174 (H) calcium 7.2 (L) albumin 3.0 (L)  Diet Order:  Diet regular Room service appropriate? Yes; Fluid consistency: Thin  Skin:   (closed incision  abdomen)  Last BM:  10/03/16  Height:   Ht Readings from Last 1 Encounters:  09/26/16 5\' 4"  (1.626 m)    Weight:   Wt Readings from Last 1 Encounters:  10/03/16 124 lb (56.2 kg)    Ideal Body Weight:  54.5 kg  BMI:  Body mass index is 21.28 kg/m.  Estimated Nutritional Needs:   Kcal:  1700-1900 (30-34 kcal/kg)  Protein:  95-105 grams (1.7-1.9 g/kg)  Fluid:  >1.7 L/day  EDUCATION NEEDS:   No education needs identified at this time  [place], LDN Clinical Nutrition Pager # - [phone]

## 2016-10-04 DIAGNOSIS — E876 Hypokalemia: Secondary | ICD-10-CM | POA: Clinically undetermined

## 2016-10-04 LAB — BASIC METABOLIC PANEL
Anion gap: 5 (ref 5–15)
BUN: 11 mg/dL (ref 6–20)
CALCIUM: 6.9 mg/dL — AB (ref 8.9–10.3)
CO2: 22 mmol/L (ref 22–32)
Chloride: 113 mmol/L — ABNORMAL HIGH (ref 101–111)
Creatinine, Ser: 0.85 mg/dL (ref 0.44–1.00)
GFR calc Af Amer: >60 mL/min (ref >60)
GLUCOSE: 101 mg/dL — AB (ref 65–99)
Potassium: 3.1 mmol/L — ABNORMAL LOW (ref 3.5–5.1)
Sodium: 140 mmol/L (ref 135–145)

## 2016-10-04 LAB — URINE CULTURE

## 2016-10-04 LAB — MAGNESIUM: Magnesium: 1.7 mg/dL (ref 1.7–2.4)

## 2016-10-04 MED ORDER — MAGNESIUM SULFATE 4 GM/100ML IV SOLN
4.0000 g | Freq: Once | INTRAVENOUS | Status: AC
  Administered 2016-10-04: 4 g via INTRAVENOUS
  Filled 2016-10-04: qty 100

## 2016-10-04 MED ORDER — POTASSIUM CHLORIDE CRYS ER 20 MEQ PO TBCR
40.0000 meq | EXTENDED_RELEASE_TABLET | ORAL | Status: AC
  Administered 2016-10-04 (×2): 40 meq via ORAL
  Filled 2016-10-04 (×2): qty 2

## 2016-10-04 MED ORDER — SIMETHICONE 80 MG PO CHEW
160.0000 mg | CHEWABLE_TABLET | Freq: Four times a day (QID) | ORAL | Status: AC
  Administered 2016-10-04 – 2016-10-05 (×8): 160 mg via ORAL
  Filled 2016-10-04 (×8): qty 2

## 2016-10-04 NOTE — Progress Notes (Signed)
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 Patient with frequents ED visit due to severe , uncontrolled pain. She report also poor oral intake. She is complaining of back pain, and abdominal pain. Denies vomiting, but she has not been able to eat. She has not had a BM in days. She has pass flatus. She is suppose to be taking antibiotics for UTI, antibiotics prescribe by PCP.  She denies dysuria, chest pain or dyspnea.   ED Course:Patient presents complaining of severe pain, leukopenia with WBC at 3.3, Hb at 11, K at 3.6, CO2; 20, UA with too numerous to count WBC. KUB non obstructive bowel pattern.     Assessment & Plan:   Principal Problem:   Metastatic renal cell carcinoma (HCC) Active Problems:   Hypertension   Metastatic renal cell carcinoma to bone (HCC)   Malignant neoplasm metastatic to bone (HCC)   Carcinoma of kidney (HCC)   Chronic constipation   Abdominal pain   Uncontrolled pain   Acute lower UTI   Leukopenia due to antineoplastic chemotherapy ([place])   Acute gastric ulcer without hemorrhage or perforation   Chronic pain due to neoplasm   Hypokalemia  1-Metastatic Renal cell carcinoma, new Diagnosis of Gastric cancer; ? Vs metastasis to stomach , Severe uncontrolled pain, back and abdomen; Pain likely secondary to malignancy.  Appreciate Dr Delia Chimes Hilma Favors  help with pain management.  IV Protonix BID.  Continue with Carafate.  on PCA pump. PCA pump discontinued and patient transitioned to oral pain regimen per palliative care for pain control in anticipation of discharge. Patient placed on a fentanyl  patch. IV decadron.  Dr Marin Olp recommended celiac plexus block which patient underwent. Patient back in pain. GI consulted for repeat endoscopy, gastric biopsy. Gastric biopsy from 09/22/2016 positive for adenocarcinoma. Patient underwent endoscopy for repat biopsy 8-07. Patient noted to have a gastric ulcer and erosive gastropathy. Continue PPI. Patient placed on Creon. - Pain medication adjustments made by palliative care for better pain control.  2-Abdominal Distension. Constipation. Resolved.  Suppository ordered.  KUB negative for obstruction.  IV fluids.  Having BM however patient states multiple loops. DC IV Reglan and Senokot. -Multiple loose stools improved however patient complaining of satiey and feeling stuffed.  3-Poor oral Intake;  IV fluids.  Tolerating regular diet  Pain poorly controlled. She has been eating less due to fear due to fear of having multiple stools. Patient complaining of feeling stuffed and a sensation of satiey. Place on scheduled simethicone 2 days.   4-UTI; Patient was suppose to be taking antibiotics for UTI.  Last Urine culture on July grew Pseudomonas.  Urine culture with 40,000 colonies of pseudomonas aeruginosa.  S/p 5 days of cefepime.   5-Leukopenia; in setting of chemo.  Status post 5 days of cefepime for UTI. Patient afebrile. Leukopenia resolved.   6. Gastric ulcer/erosive gastropathy Per upper endoscopy. Continue PPI. Per GI. Biopsies from prior gastric ulcer of 09/22/2016 positive for adenocarcinoma. Repeat biopsies with poorly differentiated carcinoma. Oncology following.  7. Hypokalemia Likely secondary to GI losses. Replete.    DVT prophylaxis: SCDs Code Status: Full Family Communication: Updated patient. No family at bedside. Disposition Plan: Home  once pain management has improved hopefully in the next 24-48 hours.   Consultants:   Oncology: [name] 09/27/2016  Palliative care: Dr.Golding  09/26/2016  Procedures:   Upper endoscopy 09/30/2016--Dr. Carlean Purl  CT abdomen and pelvis 09/25/2016  CT-guided neurolytic ablation of the celiac axis per Dr. Jarvis Newcomer 09/30/2016  Abdominal x-ray 09/26/2016, 09/25/2016, 10/02/2016  Antimicrobials:   IV cefepime 09/26/2016>>>>> 10/01/2016   Subjective: Patient complaining of right sided pain however with some improvement. Patient states loose stools have improved after Senokot and Reglan were discontinued yesterday. Patient states can only eat a little bit as she has a feeling of site and feels stuffed. Patient very tearful and worried she might not leave the hospital alive.  Objective: Vitals:   10/03/16 1351 10/03/16 2018 10/03/16 2131 10/04/16 0557  BP: 126/75  131/72 118/67  Pulse: 96  91 91  Resp: 18  18 18   Temp: 97.7 F (36.5 C)  98.2 F (36.8 C) 98.7 F (37.1 C)  TempSrc: Oral  Oral Oral  SpO2: 97% 98% 97% 97%  Weight:    56.3 kg (124 lb 2.8 oz)  Height:        Intake/Output Summary (Last 24 hours) at 10/04/16 1044 Last data filed at 10/04/16 0600  Gross per 24 hour  Intake             4433 ml  Output              600 ml  Net             3833 ml   Filed Weights   10/02/16 0519 10/03/16 0057 10/04/16 0557  Weight: 54.8 kg (120 lb 12 oz) 56.2 kg (124 lb) 56.3 kg (124 lb 2.8 oz)    Examination:  General exam: In less pain. Respiratory system: Clear to auscultation anterior lung fields. Respiratory effort normal. Cardiovascular system: S1 & S2 heard, RRR. No JVD, murmurs, rubs, gallops or clicks. No pedal edema. Gastrointestinal system: Abdomen is minimal to mildly distended, soft and tender to palpation in the right upper quadrant and right side. No organomegaly or masses felt. Normal bowel sounds heard. Central nervous system: Alert and oriented. No focal neurological deficits. Extremities: Symmetric 5 x 5 power. Skin: No rashes, lesions or ulcers Psychiatry: Judgement and insight appear normal. Mood & affect  appropriate.     Data Reviewed: I have personally reviewed following labs and imaging studies  CBC:  Recent Labs Lab 09/28/16 0353 09/29/16 0421 10/01/16 0343 10/02/16 0334 10/03/16 0820  WBC 4.1 5.3 5.4 4.6 5.3  NEUTROABS  --   --   --  3.2 3.9  HGB 10.4* 10.4* 10.8* 10.6* 10.9*  HCT 32.5* 32.6* 34.0* 32.1* 34.1*  MCV 97.6 99.4 96.6 94.7 97.2  PLT 200 228 214 189 622   Basic Metabolic Panel:  Recent Labs Lab 09/29/16 0421 10/01/16 0343 10/02/16 0334 10/03/16 0820 10/04/16 0357  NA 142 139 139 140 140  K 4.7 3.9 3.2* 3.2* 3.1*  CL 106 105 108 111 113*  CO2 27 27 23  21* 22  GLUCOSE 134* 122* 106* 123* 101*  BUN 9 18 22* 13 11  CREATININE 0.90 0.90 0.85 0.83 0.85  CALCIUM 8.1* 7.3* 7.4* 7.2* 6.9*  MG  --   --   --   --  1.7   GFR: Estimated Creatinine Clearance: 50.9 mL/min (by C-G formula based on SCr of 0.85 mg/dL). Liver Function Tests:  Recent Labs Lab 10/03/16 0820  AST 34  ALT 39  ALKPHOS 43  BILITOT 0.3  PROT 5.6*  ALBUMIN 3.2*   No results for input(s): LIPASE, AMYLASE in the last 168 hours. No results for input(s): AMMONIA in the last 168 hours. Coagulation Profile: No results for input(s): INR, PROTIME in the last 168 hours. Cardiac Enzymes: No results for input(s): CKTOTAL, CKMB, CKMBINDEX, TROPONINI in the last 168 hours. BNP (last 3 results) No results for input(s): PROBNP in the last 8760 hours. HbA1C: No results for input(s): HGBA1C in the last 72 hours. CBG: No results for input(s): GLUCAP in the last 168 hours. Lipid Profile: No results for input(s): CHOL, HDL, LDLCALC, TRIG, CHOLHDL, LDLDIRECT in the last 72 hours. Thyroid Function Tests: No results for input(s): TSH, T4TOTAL, FREET4, T3FREE, THYROIDAB in the last 72 hours. Anemia Panel: No results for input(s): VITAMINB12, FOLATE, FERRITIN, TIBC, IRON, RETICCTPCT in the last 72 hours. Sepsis Labs: No results for input(s): PROCALCITON, LATICACIDVEN in the last 168  hours.  Recent Results (from the past 240 hour(s))  Urine Culture     Status: Abnormal   Collection Time: 09/26/16  3:22 PM  Result Value Ref Range Status   Specimen Description URINE, RANDOM  Final   Special Requests NONE  Final   Culture 40,000 COLONIES/mL PSEUDOMONAS AERUGINOSA (A)  Final   Report Status 09/29/2016 FINAL  Final   Organism ID, Bacteria PSEUDOMONAS AERUGINOSA (A)  Final      Susceptibility   Pseudomonas aeruginosa - MIC*    CEFTAZIDIME 4 SENSITIVE Sensitive     CIPROFLOXACIN <=0.25 SENSITIVE Sensitive     GENTAMICIN <=1 SENSITIVE Sensitive     IMIPENEM 1 SENSITIVE Sensitive     PIP/TAZO <=4 SENSITIVE Sensitive     CEFEPIME <=1 SENSITIVE Sensitive     * 40,000 COLONIES/mL PSEUDOMONAS AERUGINOSA  Culture, Urine     Status: Abnormal   Collection Time: 10/02/16  8:05 AM  Result Value Ref Range Status   Specimen Description URINE, CLEAN CATCH  Final   Special Requests NONE  Final   Culture (A)  Final    <10,000 COLONIES/mL INSIGNIFICANT GROWTH Performed at [facility], [address], [place], [place] [id number]    Report Status 10/04/2016 FINAL  Final         Radiology Studies: No results found.      Scheduled Meds: . Alcohol  50 mL Intracatheter Once  . dexamethasone  2 mg Oral Daily  . famotidine  40 mg Oral QHS  . feeding supplement (ENSURE ENLIVE)  237 mL Oral BID BM  . fentaNYL  25 mcg Transdermal Q48H  . fluticasone  1 spray Each Nare Daily  . lidocaine  3 patch Transdermal Q24H  . mometasone-formoterol  2 puff Inhalation BID  . pantoprazole  40 mg Oral BID  . potassium chloride  40 mEq Oral Q4H  . simethicone  160 mg Oral QID  . sucralfate  1 g Oral TID WC & HS  . traZODone  50 mg Oral QHS   Continuous Infusions: . dextrose 5 % and 0.9% NaCl 75 mL/hr at 10/04/16 0340  . magnesium sulfate 1 - 4 g bolus IVPB 4 g (10/04/16 0952)     LOS: 8 days    Time spent: 105 minutes    Jelene Albano, MD [facility] Pager  [phone]  If 7PM-7AM, please contact night-coverage [email or web address] Password [facility] 10/04/2016, 10:44 AM

## 2016-10-05 DIAGNOSIS — Z515 Encounter for palliative care: Secondary | ICD-10-CM

## 2016-10-05 LAB — BASIC METABOLIC PANEL
Anion gap: 6 (ref 5–15)
BUN: 7 mg/dL (ref 6–20)
CALCIUM: 6.9 mg/dL — AB (ref 8.9–10.3)
CO2: 22 mmol/L (ref 22–32)
CREATININE: 0.83 mg/dL (ref 0.44–1.00)
Chloride: 114 mmol/L — ABNORMAL HIGH (ref 101–111)
GFR calc Af Amer: >60 mL/min (ref >60)
GLUCOSE: 99 mg/dL (ref 65–99)
Potassium: 4.1 mmol/L (ref 3.5–5.1)
Sodium: 142 mmol/L (ref 135–145)

## 2016-10-05 LAB — MAGNESIUM: Magnesium: 2.2 mg/dL (ref 1.7–2.4)

## 2016-10-05 MED ORDER — HYDROMORPHONE HCL-NACL 0.5-0.9 MG/ML-% IV SOSY
0.5000 mg | PREFILLED_SYRINGE | INTRAVENOUS | Status: DC | PRN
  Administered 2016-10-05 – 2016-10-06 (×6): 1 mg via INTRAVENOUS
  Filled 2016-10-05 (×6): qty 2

## 2016-10-05 NOTE — Progress Notes (Signed)
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 Patient with frequents ED visit due to severe , uncontrolled pain. She report also poor oral intake. She is complaining of back pain, and abdominal pain. Denies vomiting, but she has not been able to eat. She has not had a BM in days. She has pass flatus. She is suppose to be taking antibiotics for UTI, antibiotics prescribe by PCP.  She denies dysuria, chest pain or dyspnea.   ED Course:Patient presents complaining of severe pain, leukopenia with WBC at 3.3, Hb at 11, K at 3.6, CO2; 20, UA with too numerous to count WBC. KUB non obstructive bowel pattern.     Assessment & Plan:   Principal Problem:   Metastatic renal cell carcinoma (HCC) Active Problems:   Hypertension   Metastatic renal cell carcinoma to bone (HCC)   Malignant neoplasm metastatic to bone (HCC)   Carcinoma of kidney (HCC)   Chronic constipation   Abdominal pain   Uncontrolled pain   Acute lower UTI   Leukopenia due to antineoplastic chemotherapy ([place])   Acute gastric ulcer without hemorrhage or perforation   Chronic pain due to neoplasm   Hypokalemia  1-Metastatic Renal cell carcinoma, new Diagnosis of Gastric cancer; ? Vs metastasis to stomach , Severe uncontrolled pain, back and abdomen; Pain likely secondary to malignancy.  Appreciate Dr Delia Chimes Hilma Favors  help with pain management.  IV Protonix BID.  Continue with Carafate.  on PCA pump. PCA pump discontinued and patient transitioned to oral pain regimen per palliative care for pain control in anticipation of discharge. Patient placed on a fentanyl  patch. IV decadron.  Dr Marin Olp recommended celiac plexus block which patient underwent. Patient back in pain. GI consulted for repeat endoscopy, gastric biopsy. Gastric biopsy from 09/22/2016 positive for adenocarcinoma. Patient underwent endoscopy for repat biopsy 8-07. Patient noted to have a gastric ulcer and erosive gastropathy. Continue PPI. Patient placed on Creon which has been discontinued. - Pain medication adjustments made by palliative care for better pain control.  2-Abdominal Distension. Constipation. Resolved.  Suppository ordered.  KUB negative for obstruction.  IV fluids.  Having BM however patient states multiple loops. DC IV Reglan and Senokot. -Multiple loose stools improved however patient complaining of satiey and feeling stuffed.  3-Poor oral Intake;  IV fluids.  Tolerating regular diet  Pain poorly controlled. She has been eating less due to fear of having multiple stools. Patient complaining of feeling stuffed and a sensation of satiey. Continue scheduled simethicone 2 days.   4-UTI; Patient was suppose to be taking antibiotics for UTI.  Last Urine culture on July grew Pseudomonas.  Urine culture with 40,000 colonies of pseudomonas aeruginosa.  S/p 5 days of cefepime.   5-Leukopenia; in setting of chemo.  Status post 5 days of cefepime for UTI. Patient afebrile. Leukopenia resolved.   6. Gastric ulcer/erosive gastropathy Per upper endoscopy. Continue PPI. Per GI. Biopsies from prior gastric ulcer of 09/22/2016 positive for adenocarcinoma. Repeat biopsies with poorly differentiated carcinoma. Oncology following.  7. Hypokalemia Likely secondary to GI losses. Repleted.    DVT prophylaxis: SCDs Code Status: Full Family Communication: Updated patient and family at bedside. Disposition Plan: Home  once pain management has improved and per palliative care and oncology.    Consultants:   Oncology: [name] 09/27/2016  Palliative care:  Dr.Golding 09/26/2016  Procedures:   Upper endoscopy 09/30/2016--Dr. Carlean Purl  CT abdomen and pelvis 09/25/2016  CT-guided neurolytic ablation of the celiac axis per Dr. Jarvis Newcomer 09/30/2016  Abdominal x-ray 09/26/2016, 09/25/2016, 10/02/2016  Antimicrobials:   IV cefepime 09/26/2016>>>>> 10/01/2016   Subjective: Patient complaining of right sided pain however with some improvement. Patient states loose stools have improved after Senokot and Reglan were discontinued. [name] early satiey and inability to eat that much food. Patient very tearful and worried she might not leave the hospital alive.  Objective: Vitals:   10/04/16 1514 10/04/16 2055 10/04/16 2136 10/05/16 0518  BP: 125/64 122/70  135/80  Pulse: 92 92  95  Resp: 18 18  18   Temp: 98.4 F (36.9 C) 98.7 F (37.1 C)  98.2 F (36.8 C)  TempSrc: Oral Oral  Oral  SpO2: 97%  96% 97%  Weight:    56.4 kg (124 lb 5.4 oz)  Height:        Intake/Output Summary (Last 24 hours) at 10/05/16 1048 Last data filed at 10/05/16 1000  Gross per 24 hour  Intake          2998.75 ml  Output              200 ml  Net          2798.75 ml   Filed Weights   10/03/16 0057 10/04/16 0557 10/05/16 0518  Weight: 56.2 kg (124 lb) 56.3 kg (124 lb 2.8 oz) 56.4 kg (124 lb 5.4 oz)    Examination:  General exam: In less pain. Respiratory system: Clear to auscultation anterior lung fields. Respiratory effort normal. Cardiovascular system: S1 & S2 heard, RRR. No JVD, murmurs, rubs, gallops or clicks. No pedal edema. Gastrointestinal system: Abdomen is minimal to mildly distended, soft and tender to palpation in the right upper quadrant and right side. No organomegaly or masses felt. Normal bowel sounds heard. Central nervous system: Alert and oriented. No focal neurological deficits. Extremities: Symmetric 5 x 5 power. Skin: No rashes, lesions or ulcers Psychiatry: Judgement and insight appear normal. Mood & affect appropriate.     Data  Reviewed: I have personally reviewed following labs and imaging studies  CBC:  Recent Labs Lab 09/29/16 0421 10/01/16 0343 10/02/16 0334 10/03/16 0820  WBC 5.3 5.4 4.6 5.3  NEUTROABS  --   --  3.2 3.9  HGB 10.4* 10.8* 10.6* 10.9*  HCT 32.6* 34.0* 32.1* 34.1*  MCV 99.4 96.6 94.7 97.2  PLT 228 214 189 546   Basic Metabolic Panel:  Recent Labs Lab 10/01/16 0343 10/02/16 0334 10/03/16 0820 10/04/16 0357 10/05/16 0418  NA 139 139 140 140 142  K 3.9 3.2* 3.2* 3.1* 4.1  CL 105 108 111 113* 114*  CO2 27 23 21* 22 22  GLUCOSE 122* 106* 123* 101* 99  BUN 18 22* 13 11 7   CREATININE 0.90 0.85 0.83 0.85 0.83  CALCIUM 7.3* 7.4* 7.2* 6.9* 6.9*  MG  --   --   --  1.7 2.2   GFR: Estimated Creatinine Clearance: 52.1 mL/min (by C-G formula based on SCr of 0.83 mg/dL). Liver Function Tests:  Recent Labs Lab 10/03/16 0820  AST 34  ALT 39  ALKPHOS 43  BILITOT 0.3  PROT 5.6*  ALBUMIN 3.2*   No results for input(s): LIPASE, AMYLASE in the last 168 hours.  No results for input(s): AMMONIA in the last 168 hours. Coagulation Profile: No results for input(s): INR, PROTIME in the last 168 hours. Cardiac Enzymes: No results for input(s): CKTOTAL, CKMB, CKMBINDEX, TROPONINI in the last 168 hours. BNP (last 3 results) No results for input(s): PROBNP in the last 8760 hours. HbA1C: No results for input(s): HGBA1C in the last 72 hours. CBG: No results for input(s): GLUCAP in the last 168 hours. Lipid Profile: No results for input(s): CHOL, HDL, LDLCALC, TRIG, CHOLHDL, LDLDIRECT in the last 72 hours. Thyroid Function Tests: No results for input(s): TSH, T4TOTAL, FREET4, T3FREE, THYROIDAB in the last 72 hours. Anemia Panel: No results for input(s): VITAMINB12, FOLATE, FERRITIN, TIBC, IRON, RETICCTPCT in the last 72 hours. Sepsis Labs: No results for input(s): PROCALCITON, LATICACIDVEN in the last 168 hours.  Recent Results (from the past 240 hour(s))  Urine Culture     Status:  Abnormal   Collection Time: 09/26/16  3:22 PM  Result Value Ref Range Status   Specimen Description URINE, RANDOM  Final   Special Requests NONE  Final   Culture 40,000 COLONIES/mL PSEUDOMONAS AERUGINOSA (A)  Final   Report Status 09/29/2016 FINAL  Final   Organism ID, Bacteria PSEUDOMONAS AERUGINOSA (A)  Final      Susceptibility   Pseudomonas aeruginosa - MIC*    CEFTAZIDIME 4 SENSITIVE Sensitive     CIPROFLOXACIN <=0.25 SENSITIVE Sensitive     GENTAMICIN <=1 SENSITIVE Sensitive     IMIPENEM 1 SENSITIVE Sensitive     PIP/TAZO <=4 SENSITIVE Sensitive     CEFEPIME <=1 SENSITIVE Sensitive     * 40,000 COLONIES/mL PSEUDOMONAS AERUGINOSA  Culture, Urine     Status: Abnormal   Collection Time: 10/02/16  8:05 AM  Result Value Ref Range Status   Specimen Description URINE, CLEAN CATCH  Final   Special Requests NONE  Final   Culture (A)  Final    <10,000 COLONIES/mL INSIGNIFICANT GROWTH Performed at [facility], [address], [place], [place] [address]    Report Status 10/04/2016 FINAL  Final         Radiology Studies: No results found.      Scheduled Meds: . Alcohol  50 mL Intracatheter Once  . dexamethasone  2 mg Oral Daily  . famotidine  40 mg Oral QHS  . feeding supplement (ENSURE ENLIVE)  237 mL Oral BID BM  . fentaNYL  25 mcg Transdermal Q48H  . fluticasone  1 spray Each Nare Daily  . lidocaine  3 patch Transdermal Q24H  . mometasone-formoterol  2 puff Inhalation BID  . pantoprazole  40 mg Oral BID  . simethicone  160 mg Oral QID  . sucralfate  1 g Oral TID WC & HS  . traZODone  50 mg Oral QHS   Continuous Infusions: . dextrose 5 % and 0.9% NaCl 75 mL/hr at 10/05/16 0320     LOS: 9 days    Time spent: 39 minutes    Madeline Bebout, MD [facility] Pager [phone]  If 7PM-7AM, please contact night-coverage [email or web address] Password [facility] 10/05/2016, 10:48 AM

## 2016-10-05 NOTE — Progress Notes (Signed)
Pt ambulated around the unit with standby assist, tolerated well.

## 2016-10-05 NOTE — Progress Notes (Signed)
Daily Progress Note   Patient Name: Cathy Lewis       Date: 10/05/2016 DOB: 10/30/1945  Age: 71 y.o. 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  Subjective: I saw and examined Cathy Lewis, her husband is at the bedside. She continues to have moderate to severe pain in her right lower quadrant. She also complains of feeling sleepy every time she gets IV Dilaudid Her 24 hour pain medicine requirements discussed in detail with her Offered active listening and supportive care See below   Length of Stay: 9  Current Medications: Scheduled Meds:  . Alcohol  50 mL Intracatheter Once  . dexamethasone  2 mg Oral Daily  . famotidine  40 mg Oral QHS  . feeding supplement (ENSURE ENLIVE)  237 mL Oral BID BM  . fentaNYL  25 mcg Transdermal Q48H  . fluticasone  1 spray Each Nare Daily  . lidocaine  3 patch Transdermal Q24H  . mometasone-formoterol  2 puff Inhalation BID  . pantoprazole  40 mg Oral BID  . simethicone  160 mg Oral QID  . sucralfate  1 g Oral TID WC & HS  . traZODone  50 mg Oral QHS    Continuous Infusions: . dextrose 5 % and 0.9% NaCl 75 mL/hr at 10/05/16 0320    PRN Meds: albuterol, bisacodyl, HYDROmorphone (DILAUDID) injection, HYDROmorphone, metoCLOPramide  Physical Exam   Awake alert Sitting up in bed Mild distress, not just due to pain, but due to her overall condition S1 S2 Clear Abdomen distended, has RLQ and R flank  tenderness Trace edema Non focal Dysthymic in mood this am    Vital Signs: BP 135/80 (BP Location: Right Arm)   Pulse 95   Temp 98.2 F (36.8 C) (Oral)   Resp 18   Ht 5\' 4"  (1.626 m)   Wt 56.4 kg (124 lb 5.4 oz)   SpO2 97%   BMI 21.34 kg/m  SpO2: SpO2: 97 % O2 Device: O2 Device: Not Delivered O2 Flow Rate: O2 Flow Rate (L/min): 0 L/min  Intake/output summary:  Intake/Output Summary (Last 24 hours) at 10/05/16 1042 Last data filed at 10/05/16 1000  Gross per 24 hour  Intake          2998.75 ml  Output              200 ml  Net          2798.75 ml   LBM: Last BM Date: 10/04/16 Baseline Weight: Weight: 53.5 kg (118 lb) Most recent weight: Weight: 56.4 kg (124 lb 5.4 oz)       Palliative Assessment/Data:      Patient Active Problem List   Diagnosis Date Noted  . Hypokalemia 10/04/2016  . Chronic pain due to neoplasm   . Acute gastric ulcer without hemorrhage or perforation   . Metastatic renal cell carcinoma ([place])   . Uncontrolled pain 09/26/2016  . Acute lower UTI 09/26/2016  . Leukopenia due to antineoplastic chemotherapy ([place]) 09/26/2016  . Chronic left-sided thoracic back pain   . Abdominal pain 09/21/2016  . Anemia 09/21/2016  . Abdominal pain, epigastric   . Chronic constipation   . Presbyopia of both eyes 03/17/2016  . GERD (gastroesophageal reflux disease) 10/09/2015  . Cough variant asthma 05/02/2015  . Metastatic renal cell carcinoma to bone ([place]) 11/20/2014  . Malignant neoplasm metastatic to bone ([place]) 08/29/2014  . H/O therapeutic radiation 08/29/2014  . Carcinoma of kidney ([place]) 08/29/2014  . LBP (low back pain) 08/29/2014  . Atypical chest pain   . Dyslipidemia   . Hypertension   . Syncope     Palliative Care Assessment & Plan   Patient Profile: 71 yo with metastatic Renal Cell Cancer, newly diagnosed Gastric adenocarcinoma followed and treated by [name], she is on daily oral chemotherapy Cabometyx. She has been seen 5 times in th  ED for uncontrolled pain and other symptoms in the setting of advanced cancer. She reports worsening symptoms for a month- severe pain in back, pain down her legs -difficulty getting comfortable in general. Not eating well. Palliative consulted for symptom management and to begin discussion on goals of care.  Recommendations/Plan:  Pain: s/p celiac block helped with epigastric pain but now with ongoing pain mostly in right lower quadrant and flank. Continue trans dermal fentanyl 25-   Oral Dilaudid 4 mg, patient has required two doses in the past 24 hours, how ever, she continues to also require IV Dilaudid 1 mg IV, has required 3 doses in the past 24 hours. Patient complains of increased sleepiness when she gets IV Dilaudid, hence, we discussed about having a range for IV Dilaudid: 0.5 to 1 mg. Re assured the patient that we will continue efforts to have both awake ness/alertness + adequate pain control. Offered active listening and supportive care as patient is distraught this morning, she believes she is not getting better.    Constipation: no longer having diarrhea, agree with d/c creon, agree with holding laxative for now.  Goals of care: She is hopeful for treatment, and also to go home ASAP.  Goals of Care and Additional Recommendations:  Limitations on Scope of Treatment: Full Scope Treatment  Code Status:    Code Status Orders        Start     Ordered   09/26/16 1934  Full code  Continuous     09/26/16 1933    Code Status History    Date Active Date Inactive Code Status Order ID Comments User Context   09/21/2016  4:29 PM 09/22/2016  8:51 PM Full  Code [id number]  Jani Gravel, MD ED   03/11/2011  5:56 PM 03/14/2011  3:47 PM Full Code [id number]  Iantha Fallen, RN Inpatient       Prognosis:   Unable to determine  Discharge Planning: Patient wants to go home- pain is limiting issue  Care plan was discussed with patient, husband,    Thank you for allowing the Palliative  Medicine Team to assist in the care of this patient.  Total time: 25 minutes Greater than 50%  of this time was spent counseling and coordinating care related to the above assessment and plan.  Loistine Chance, MD [phone]  Please contact Palliative Medicine Team phone at [phone] for questions and concerns.

## 2016-10-06 DIAGNOSIS — R109 Unspecified abdominal pain: Secondary | ICD-10-CM

## 2016-10-06 LAB — CBC WITH DIFFERENTIAL/PLATELET
BASOS ABS: 0 10*3/uL (ref 0.0–0.1)
BASOS PCT: 0 %
EOS ABS: 0 10*3/uL (ref 0.0–0.7)
Eosinophils Relative: 0 %
HCT: 32.3 % — ABNORMAL LOW (ref 36.0–46.0)
HEMOGLOBIN: 10.2 g/dL — AB (ref 12.0–15.0)
Lymphocytes Relative: 18 %
Lymphs Abs: 1.1 10*3/uL (ref 0.7–4.0)
MCH: 31.3 pg (ref 26.0–34.0)
MCHC: 31.6 g/dL (ref 30.0–36.0)
MCV: 99.1 fL (ref 78.0–100.0)
MONOS PCT: 13 %
Monocytes Absolute: 0.8 10*3/uL (ref 0.1–1.0)
NEUTROS PCT: 69 %
Neutro Abs: 4.4 10*3/uL (ref 1.7–7.7)
Platelets: 200 10*3/uL (ref 150–400)
RBC: 3.26 MIL/uL — ABNORMAL LOW (ref 3.87–5.11)
RDW: 18.3 % — AB (ref 11.5–15.5)
WBC: 6.3 10*3/uL (ref 4.0–10.5)

## 2016-10-06 LAB — COMPREHENSIVE METABOLIC PANEL
ALBUMIN: 2.8 g/dL — AB (ref 3.5–5.0)
ALK PHOS: 41 U/L (ref 38–126)
ALT: 27 U/L (ref 14–54)
ANION GAP: 6 (ref 5–15)
AST: 18 U/L (ref 15–41)
BUN: 9 mg/dL (ref 6–20)
CO2: 24 mmol/L (ref 22–32)
Calcium: 7.7 mg/dL — ABNORMAL LOW (ref 8.9–10.3)
Chloride: 113 mmol/L — ABNORMAL HIGH (ref 101–111)
Creatinine, Ser: 0.8 mg/dL (ref 0.44–1.00)
GFR calc Af Amer: >60 mL/min (ref >60)
GFR calc non Af Amer: >60 mL/min (ref >60)
GLUCOSE: 105 mg/dL — AB (ref 65–99)
POTASSIUM: 4.8 mmol/L (ref 3.5–5.1)
SODIUM: 143 mmol/L (ref 135–145)
Total Bilirubin: 0.3 mg/dL (ref 0.3–1.2)
Total Protein: 5.5 g/dL — ABNORMAL LOW (ref 6.5–8.1)

## 2016-10-06 MED ORDER — LORAZEPAM 1 MG PO TABS
1.0000 mg | ORAL_TABLET | ORAL | Status: DC | PRN
  Administered 2016-10-06: 1 mg via ORAL
  Filled 2016-10-06: qty 1

## 2016-10-06 MED ORDER — FENTANYL 50 MCG/HR TD PT72
50.0000 ug | MEDICATED_PATCH | TRANSDERMAL | Status: DC
Start: 2016-10-06 — End: 2016-10-07
  Administered 2016-10-06: 50 ug via TRANSDERMAL
  Filled 2016-10-06: qty 1

## 2016-10-06 MED ORDER — LORAZEPAM 2 MG/ML IJ SOLN
0.5000 mg | INTRAMUSCULAR | Status: DC | PRN
  Filled 2016-10-06: qty 1

## 2016-10-06 MED ORDER — HYDROMORPHONE HCL-NACL 0.5-0.9 MG/ML-% IV SOSY
1.0000 mg | PREFILLED_SYRINGE | INTRAVENOUS | Status: DC | PRN
  Administered 2016-10-06: 1 mg via INTRAVENOUS
  Filled 2016-10-06: qty 2

## 2016-10-06 MED ORDER — MORPHINE SULFATE (CONCENTRATE) 10 MG/0.5ML PO SOLN
20.0000 mg | ORAL | Status: DC | PRN
  Administered 2016-10-06 – 2016-10-07 (×5): 20 mg via SUBLINGUAL
  Filled 2016-10-06 (×6): qty 1

## 2016-10-06 NOTE — Care Management Important Message (Signed)
Important Message  Patient Details  Name: Cathy Lewis MRN: [id number] Date of Birth: 1945-12-14   Medicare Important Message Given:  Yes    Kerin Salen 10/06/2016, 1:05 [place] Message  Patient Details  Name: Cathy Lewis MRN: [id number] Date of Birth: 09/18/1945   Medicare Important Message Given:  Yes    Kerin Salen 10/06/2016, 1:05 PM

## 2016-10-06 NOTE — Care Management Note (Signed)
Case Management Note  Patient Details  Name: Cathy Lewis MRN: [id number] Date of Birth: 1945-12-21  Subjective/Objective:  Referral for home hospice choice-went into rm to offer choice to patient-patient seemed very sad-asked if I come back later.                  Action/Plan:d/c plan home w/hospice Expected Discharge Date:   (unknown)               Expected Discharge Plan:  Home w Hospice Care  In-House Referral:     Discharge planning Services  CM Consult  Post Acute Care Choice:    Choice offered to:     DME Arranged:    DME Agency:     HH Arranged:    HH Agency:     Status of Service:  In process, will continue to follow  If discussed at Long Length of Stay Meetings, dates discussed:    Additional Comments:  Dessa Phi, RN 10/06/2016, 2:10 PM

## 2016-10-06 NOTE — Progress Notes (Signed)
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 Patient with frequents ED visit due to severe , uncontrolled pain. She report also poor oral intake. She is complaining of back pain, and abdominal pain. Denies vomiting, but she has not been able to eat. She has not had a BM in days. She has pass flatus. She is suppose to be taking antibiotics for UTI, antibiotics prescribe by PCP.  She denies dysuria, chest pain or dyspnea.   ED Course:Patient presents complaining of severe pain, leukopenia with WBC at 3.3, Hb at 11, K at 3.6, CO2; 20, UA with too numerous to count WBC. KUB non obstructive bowel pattern.     Assessment & Plan:   Principal Problem:   Metastatic renal cell carcinoma (HCC) Active Problems:   Hypertension   Metastatic renal cell carcinoma to bone (HCC)   Malignant neoplasm metastatic to bone (HCC)   Carcinoma of kidney (HCC)   Chronic constipation   Abdominal pain   Uncontrolled pain   Acute lower UTI   Leukopenia due to antineoplastic chemotherapy ([place])   Acute gastric ulcer without hemorrhage or perforation   Chronic pain due to neoplasm   Hypokalemia   Encounter for palliative care  1-Metastatic Renal cell carcinoma, new Diagnosis of Gastric cancer; ? Vs metastasis to stomach , Severe uncontrolled pain, back and abdomen; Pain likely secondary to malignancy.  Appreciate Dr Delia Chimes Hilma Favors  help with pain management.  IV Protonix BID.  Continue with Carafate.  on PCA pump. PCA pump discontinued and patient transitioned to oral pain regimen per palliative care for pain control in anticipation of discharge.  Patient placed on a fentanyl patch. IV decadron.  Dr Marin Olp recommended celiac plexus block which patient underwent. Patient back in pain. GI consulted for repeat endoscopy, gastric biopsy. Gastric biopsy from 09/22/2016 positive for adenocarcinoma. Patient underwent endoscopy for repat biopsy 8-07. Patient noted to have a gastric ulcer and erosive gastropathy. Continue PPI. Patient placed on Creon which has been discontinued. - Pain medication adjustments made by palliative care for better pain control. - Patient miserable. Patient being followed by palliative care. Patient home with hospice tomorrow per palliative care.  2-Abdominal Distension. Constipation. Resolved.  Suppository ordered.  KUB negative for obstruction.  IV fluids.  Having BM however patient states multiple loops. DC IV Reglan and Senokot. -Multiple loose stools improved however patient complaining of satiey and feeling stuffed.  3-Poor oral Intake;  IV fluids.  Tolerating regular diet  Pain poorly controlled. She has been eating less due to fear of having multiple stools. Patient complaining of feeling stuffed and a sensation of satiey. Continue scheduled simethicone 2 days.   4-UTI; Patient was suppose to be taking antibiotics for UTI.  Last Urine culture on July grew Pseudomonas.  Urine culture with 40,000 colonies of pseudomonas aeruginosa.  S/p 5 days of cefepime.   5-Leukopenia; in setting of chemo.  Status post 5 days of cefepime for UTI. Patient afebrile. Leukopenia resolved.   6. Gastric ulcer/erosive gastropathy Per upper endoscopy. Continue PPI. Per GI. Biopsies from prior gastric ulcer of 09/22/2016 positive for adenocarcinoma. Repeat biopsies with poorly differentiated carcinoma. Patient with early [place]. Oncology following.  7.  Hypokalemia Likely secondary to GI losses. Repleted.  8 prognosis Patient's pain not fully controlled. Patient is miserable. Patient with some abdominal distention  and early satiey. Patient with poor oral intake. Pain medications have been adjusted. Per palliative patient home with hospice tomorrow.    DVT prophylaxis: SCDs Code Status: Full Family Communication: Updated patient and family at bedside. Disposition Plan: Home with hospice, hopefully tomorrow.   Consultants:   Oncology: [name] 09/27/2016  Palliative care: Dr.Golding 09/26/2016  Procedures:   Upper endoscopy 09/30/2016--Dr. Carlean Purl  CT abdomen and pelvis 09/25/2016  CT-guided neurolytic ablation of the celiac axis per Dr. Jarvis Newcomer 09/30/2016  Abdominal x-ray 09/26/2016, 09/25/2016, 10/02/2016  Antimicrobials:   IV cefepime 09/26/2016>>>>> 10/01/2016   Subjective: Patient complaining of right sided pain with no significant improvement on pain medication. Loose stools have improved. [name] early satiey and inability to eat that much food. Patient very tearful.  Objective: Vitals:   10/05/16 0518 10/05/16 1357 10/05/16 2059 10/06/16 0519  BP: 135/80 (!) 107/56 122/81 125/70  Pulse: 95 100 94 95  Resp: 18 18 16 18   Temp: 98.2 F (36.8 C) 97.7 F (36.5 C) 97.7 F (36.5 C) 98.5 F (36.9 C)  TempSrc: Oral Oral Oral Oral  SpO2: 97% 96% 96% 94%  Weight: 56.4 kg (124 lb 5.4 oz)   56.2 kg (123 lb 15.6 oz)  Height:        Intake/Output Summary (Last 24 hours) at 10/06/16 1232 Last data filed at 10/06/16 1000  Gross per 24 hour  Intake             2635 ml  Output              200 ml  Net             2435 ml   Filed Weights   10/04/16 0557 10/05/16 0518 10/06/16 0519  Weight: 56.3 kg (124 lb 2.8 oz) 56.4 kg (124 lb 5.4 oz) 56.2 kg (123 lb 15.6 oz)    Examination:  General exam: In pain Respiratory system: Clear to auscultation anterior lung fields. Respiratory effort normal. Cardiovascular system: S1 & S2 heard, RRR. No JVD, murmurs, rubs, gallops or clicks. No pedal edema. Gastrointestinal system: Abdomen is mildly distended, soft and tender to  palpation in the right upper quadrant and right side. No organomegaly or masses felt. Normal bowel sounds heard. Central nervous system: Alert and oriented. No focal neurological deficits. Extremities: Symmetric 5 x 5 power. Skin: No rashes, lesions or ulcers Psychiatry: Judgement and insight appear normal. Mood & affect appropriate.     Data Reviewed: I have personally reviewed following labs and imaging studies  CBC:  Recent Labs Lab 10/01/16 0343 10/02/16 0334 10/03/16 0820 10/06/16 0426  WBC 5.4 4.6 5.3 6.3  NEUTROABS  --  3.2 3.9 4.4  HGB 10.8* 10.6* 10.9* 10.2*  HCT 34.0* 32.1* 34.1* 32.3*  MCV 96.6 94.7 97.2 99.1  PLT 214 189 220 811   Basic Metabolic Panel:  Recent Labs Lab 10/02/16 0334 10/03/16 0820 10/04/16 0357 10/05/16 0418 10/06/16 0426  NA 139 140 140 142 143  K 3.2* 3.2* 3.1* 4.1 4.8  CL 108 111 113* 114* 113*  CO2 23 21* 22 22 24   GLUCOSE 106* 123* 101* 99 105*  BUN 22* 13 11 7 9   CREATININE 0.85 0.83 0.85 0.83 0.80  CALCIUM 7.4* 7.2* 6.9* 6.9* 7.7*  MG  --   --  1.7 2.2  --    GFR: Estimated  Creatinine Clearance: 54.1 mL/min (by C-G formula based on SCr of 0.8 mg/dL). Liver Function Tests:  Recent Labs Lab 10/03/16 0820 10/06/16 0426  AST 34 18  ALT 39 27  ALKPHOS 43 41  BILITOT 0.3 0.3  PROT 5.6* 5.5*  ALBUMIN 3.2* 2.8*   No results for input(s): LIPASE, AMYLASE in the last 168 hours. No results for input(s): AMMONIA in the last 168 hours. Coagulation Profile: No results for input(s): INR, PROTIME in the last 168 hours. Cardiac Enzymes: No results for input(s): CKTOTAL, CKMB, CKMBINDEX, TROPONINI in the last 168 hours. BNP (last 3 results) No results for input(s): PROBNP in the last 8760 hours. HbA1C: No results for input(s): HGBA1C in the last 72 hours. CBG: No results for input(s): GLUCAP in the last 168 hours. Lipid Profile: No results for input(s): CHOL, HDL, LDLCALC, TRIG, CHOLHDL, LDLDIRECT in the last 72 hours. Thyroid  Function Tests: No results for input(s): TSH, T4TOTAL, FREET4, T3FREE, THYROIDAB in the last 72 hours. Anemia Panel: No results for input(s): VITAMINB12, FOLATE, FERRITIN, TIBC, IRON, RETICCTPCT in the last 72 hours. Sepsis Labs: No results for input(s): PROCALCITON, LATICACIDVEN in the last 168 hours.  Recent Results (from the past 240 hour(s))  Urine Culture     Status: Abnormal   Collection Time: 09/26/16  3:22 PM  Result Value Ref Range Status   Specimen Description URINE, RANDOM  Final   Special Requests NONE  Final   Culture 40,000 COLONIES/mL PSEUDOMONAS AERUGINOSA (A)  Final   Report Status 09/29/2016 FINAL  Final   Organism ID, Bacteria PSEUDOMONAS AERUGINOSA (A)  Final      Susceptibility   Pseudomonas aeruginosa - MIC*    CEFTAZIDIME 4 SENSITIVE Sensitive     CIPROFLOXACIN <=0.25 SENSITIVE Sensitive     GENTAMICIN <=1 SENSITIVE Sensitive     IMIPENEM 1 SENSITIVE Sensitive     PIP/TAZO <=4 SENSITIVE Sensitive     CEFEPIME <=1 SENSITIVE Sensitive     * 40,000 COLONIES/mL PSEUDOMONAS AERUGINOSA  Culture, Urine     Status: Abnormal   Collection Time: 10/02/16  8:05 AM  Result Value Ref Range Status   Specimen Description URINE, CLEAN CATCH  Final   Special Requests NONE  Final   Culture (A)  Final    <10,000 COLONIES/mL INSIGNIFICANT GROWTH Performed at [facility], [address] [place], [place] [address]    Report Status 10/04/2016 FINAL  Final         Radiology Studies: No results found.      Scheduled Meds: . Alcohol  50 mL Intracatheter Once  . dexamethasone  2 mg Oral Daily  . famotidine  40 mg Oral QHS  . feeding supplement (ENSURE ENLIVE)  237 mL Oral BID BM  . fentaNYL  50 mcg Transdermal Q48H  . fluticasone  1 spray Each Nare Daily  . lidocaine  3 patch Transdermal Q24H  . mometasone-formoterol  2 puff Inhalation BID  . pantoprazole  40 mg Oral BID  . sucralfate  1 g Oral TID WC & HS  . traZODone  50 mg Oral QHS   Continuous  Infusions: . dextrose 5 % and 0.9% NaCl 75 mL/hr at 10/06/16 0138     LOS: 10 days    Time spent: 94 minutes    Vadie Principato, MD [facility] Pager [phone]  If 7PM-7AM, please contact night-coverage [email or web address] Password [facility] 10/06/2016, 12:32 PM

## 2016-10-06 NOTE — Progress Notes (Signed)
Unfortunately, Cathy Lewis is just not that much better. She has quite a bit of abdominal pain. She is distended. Her bowel sounds are minimal. She says that she feels "full" all the time.  She did have a bowel movement yesterday.  She says is hard for her to walk because of the pain. Again she just feels as if she is "full" and this really is causing a lot of distress.  I think it would be very worthwhile to get an upper GI with small bowel follow-through. We can see how long it takes for contrasted get through her abdomen. When I listen to her abdomen, I really cannot hear much bowel sounds.  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  Her labs look okay. Her albumin is 2.8. Her potassium 4.8. Her creatinine 0.8. Her white cell count 6.3. Her hemoglobin is 10.2. Her platelet count 200,000.  She just seems very uncomfortable. Maybe we should increase the fentanyl patch.  On her physical exam, her vital signs look stable. Her blood pressure is 125/70. Temperature is 98.5. Her pulse is 95. Her lungs are clear. Cardiac exam regular rate and rhythm. Abdomen is slightly distended. I really cannot hear bowel sounds. There is some slight guarding to palpation. Extremities shows some slight muscle atrophy in upper and lower extremities. Neurological exam shows no focal neurological deficits.  Cathy Lewis has metastatic renal cell carcinoma. Again I worry that she has invasion into her abdominal neuronal plexus and that she just has very limited intestinal function. This is very, very difficult to improve.  If we are not able to get her much better, denies don't see how we will be able to treat her. I talked to her about this. I said that we might be looking at getting hospice involved if she cannot improve. She understands this.  The staff on [address] I really doing great job. I know that they are giving her a lot of attention and  showing a great deal of compassion.  Cathy Haw, MD  [place] 7:7-8

## 2016-10-06 NOTE — Progress Notes (Signed)
Pt's son asked if pt can get nutrition in any other way.Spoke with Dr. Grandville Silos he stated a peg tube, which probably wouldn't work because she has gastric cancer. Told son I would call Dr. Hilma Favors for her to speak to him, he left just as I was going to call her.

## 2016-10-06 NOTE — Progress Notes (Addendum)
Cathy Lewis has not improved much in the 10 days she has been in the hospital. She believes things are getting worse- despite increasing her pain medications. "The only time Im not in pain is when I am asleep". She is coping with the news that her cancer probably cannot be treated based on her inability to eat, increasing weakness and difficulty with pain and fullness.  I had a conversation with Cathy Lewis and her husband- she is agreeing to hospice care at home- would like to get her home tomorrow- I hope the fentanyl patch increase dose will help.I have increased her IV dilaudid and added on IV ativan for her increasing anxiety-she is clearly in a place where she is doing internal spiritual work and preparing for next steps.  1. Chaplain consult 2. A DNR is the only appropriate medical order given her condition- she is just in too much discomfort for me to have a meaningful conversation about this medical order. 3. Increased her PRNs for anxiety and pain 4. Home with hospice tomorrow hopefully. 5. Goals are comfort only.  Lane Hacker, DO Palliative Medicine   Time: 35 minutes Greater than 50%  of this time was spent counseling and coordinating care related to the above assessment and plan.

## 2016-10-07 DIAGNOSIS — R1011 Right upper quadrant pain: Secondary | ICD-10-CM

## 2016-10-07 DIAGNOSIS — Z515 Encounter for palliative care: Secondary | ICD-10-CM

## 2016-10-07 MED ORDER — LORAZEPAM 0.5 MG PO TABS: 0.2500 mg | ORAL_TABLET | Freq: Two times a day (BID) | ORAL | 0 refills | Status: AC

## 2016-10-07 MED ORDER — LORAZEPAM 1 MG PO TABS
1.0000 mg | ORAL_TABLET | Freq: Two times a day (BID) | ORAL | Status: DC
  Administered 2016-10-07: 1 mg via ORAL
  Filled 2016-10-07: qty 1

## 2016-10-07 MED ORDER — TRAZODONE HCL 50 MG PO TABS: 50.0000 mg | ORAL_TABLET | Freq: Every day | ORAL | 0 refills | Status: AC

## 2016-10-07 MED ORDER — LIDOCAINE 5 % EX PTCH: 3.0000 | MEDICATED_PATCH | CUTANEOUS | 0 refills | Status: AC

## 2016-10-07 MED ORDER — DRONABINOL 2.5 MG PO CAPS
2.5000 mg | ORAL_CAPSULE | Freq: Three times a day (TID) | ORAL | Status: DC
  Filled 2016-10-07 (×2): qty 1

## 2016-10-07 MED ORDER — ENSURE ENLIVE PO LIQD: 237.0000 mL | Freq: Two times a day (BID) | ORAL | 0 refills | Status: AC

## 2016-10-07 MED ORDER — MORPHINE SULFATE (CONCENTRATE) 10 MG/0.5ML PO SOLN: 20.0000 mg | ORAL | 0 refills | Status: AC | PRN

## 2016-10-07 MED ORDER — DRONABINOL 2.5 MG PO CAPS: 2.5000 mg | ORAL_CAPSULE | Freq: Three times a day (TID) | ORAL | 0 refills | Status: AC

## 2016-10-07 MED ORDER — FENTANYL 50 MCG/HR TD PT72: 50.0000 ug | MEDICATED_PATCH | TRANSDERMAL | 0 refills | Status: AC

## 2016-10-07 MED ORDER — LORAZEPAM 1 MG PO TABS: 1.0000 mg | ORAL_TABLET | Freq: Two times a day (BID) | ORAL | 0 refills | Status: DC

## 2016-10-07 MED ORDER — BISACODYL 5 MG PO TBEC: 5.0000 mg | DELAYED_RELEASE_TABLET | Freq: Every day | ORAL | 0 refills | Status: AC | PRN

## 2016-10-07 NOTE — Progress Notes (Signed)
Discussed her discharge plans with Dr. Perley Jain is very sedated today.Goal is comfort, hopefully we can maintain her alertness and control her pain which will be better for her safety at home. She is declining much more rapidly than I anticipated.   Plan: 1. DNR-I placed this order on the chart based on my conversation with [name] and with Ms. Labella when she was more alert. 2. Continue 19mcg Duragesic 3. SL Roxanol 20-30mg  q1 prn for breakthrough pain 4. Prognosis is probably less than a few weeks. 5. Ideally would like a hospice RN to go out today to her home once she leaves the hospital to do the admission and support that transfer home. 6. Reduced the dose of oral ativan -home team can help with that dosing.  I reviewed patient discharge instructions and orders -completed by Dr. Grandville Silos.   Lane Hacker, DO Palliative Medicine [phone]

## 2016-10-07 NOTE — Progress Notes (Signed)
Nutrition Brief Note  Chart reviewed. Pt now transitioning to comfort care per palliative care note. No further nutrition interventions warranted at this time.   Clayton Bibles, MS, RD, LDN Pager: [phone] After Hours Pager: [phone]

## 2016-10-07 NOTE — Progress Notes (Signed)
CM consult for home hospice choice. This CM met with pt, husband and son at bedside for dc planning. Pt offered choice for home hospice services and [facility] was chosen. [facility] contacted for referral. No equipment requested for home. Marney Doctor RN,BSN,NCM [phone]

## 2016-10-07 NOTE — Progress Notes (Signed)
   10/07/16 0900  Clinical Encounter Type  Visited With Patient and family together  Visit Type Initial;Psychological support;Spiritual support  Referral From Palliative care team  Consult/Referral To Chaplain  Spiritual Encounters  Spiritual Needs Emotional;Other (Comment) (Pastoral Conversation/Support)  Stress Factors  Patient Stress Factors Health changes;Major life changes  Family Stress Factors Health changes;Major life changes   I visited with the patient per Spiritual Care consult from the Palliative Care team.  The patient was asleep upon my arrival; but her husband and son were at the bedside.  The patient is struggling emotionally with her prognosis and the information that she received from the care team yesterday. The patient and her family seem to understand the prognosis and are "trying to take it all in."  The patient stated that she "can't stay awake," and was very tearful before dozing off.  At this time they just needed an encouraging pastoral presence and compassionate listening.  The patient and family's needs are high at this time for support, but they are in a state of grieving over the patient's condition at this time.   Please, contact Spiritual Care for further support.   [place] M.Div.

## 2016-10-07 NOTE — Discharge Summary (Signed)
Physician Discharge Summary  Cathy Lewis TIR:[id number] DOB: 1945-03-23 DOA: 09/26/2016  PCP: Christain Sacramento, MD  Admit date: 09/26/2016 Discharge date: 10/07/2016  Time spent: 60 minutes  Recommendations for Outpatient Follow-up:  1. Patient discharged home with [facility]. 2. Follow-up with Dr.Ennever, in 1-2 weeks.   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 Patient with frequents ED visit due to severe , uncontrolled pain. She reported also poor oral intake. She was complaining of back pain, and abdominal pain. Denied vomiting, but she had not been able to eat. She had not had a BM in days. She did pass flatus. She was suppose to be taking antibiotics for UTI, antibiotics prescribed by PCP.  She denied dysuria, chest pain or dyspnea.   ED Course: Patient presents complaining of severe pain, leukopenia with WBC at 3.3, Hb at 11, K at 3.6, CO@ 20, UA with too numerous to count WBC. KUB non  obstructive bowel pattern.   Hospital Course:  1-Metastatic Renal cell carcinoma, new Diagnosis of Gastric cancer; ? Vs metastasis to stomach , Severe uncontrolled pain, back and abdomen; Pain likely secondary to malignancy.  Palliative care was consulted and patient was seen in consultation by Dr Fanny Dance who helped with patient's pain management throughout the hospitalization. Patient was also placed on a PPI twice daily. Patient was also maintained on Carafate. Patient initially started on a PCA Dilaudid pump which was subsequently discontinued and patient transitioned to oral pain regimen per palliative care. Fentanyl patch was also placed. Patient was also placed on IV Decadron which was subsequently transitioned to oral Decadron.  Dr Marin Olp assistant followed the patient throughout the hospitalization and recommended celiac plexus block which patient underwent. Patient however after one to 2 days was back in significant pain. GI consulted for repeat endoscopy, gastric biopsy. Gastric biopsy from 09/22/2016 positive for adenocarcinoma. Patient underwent endoscopy for repat biopsy 8-07. Patient noted to have a gastric ulcer and erosive gastropathy. Patient maintained on a PPI. Patient was also placed on Creon which was subsequently further discontinued.  - Pain medication adjustments made by palliative care for better pain control. - Patient miserable. Patient being followed by palliative care. Patient was subsequently discharged home with hospice.  2-Abdominal Distension. Constipation. Resolved.  Patient presented with abdominal distention and constipation. Suppository was given as well as a bowel regimen. KUB negative for obstruction. Patient subsequently started to have multiple loose bowel movements and constipation resolved. Due to multiple loose stools  Reglan Senokot and bowel regimen were discontinued and held. Patient's diarrhea improved. Patient however continued to have some  abdominal bloating and distention and early satiety affecting her oral intake.  3-Poor oral Intake;  Patient placed on IV fluids. Patient still with poor oral intake that she started to have early satiety. Patient's pain was being managed by palliative care. She had been eating less due to fear of having multiple stools. Patient complaining of feeling stuffed and a sensation of satiey. Patient was placed on simethicone however no significant improvement with abdominal bloating.  4-UTI; Patient was suppose to be taking antibiotics for UTI.  Last Urine culture on July grew Pseudomonas.  Urine culture with 40,000 colonies of pseudomonas aeruginosa.  S/p 5 days of cefepime.   5-Leukopenia; in setting of chemo.  Status post 5 days of cefepime for UTI. Patient remained afebrile. Leukopenia resolved by day of discharge..   6. Gastric ulcer/erosive gastropathy Per upper endoscopy. Patient was maintained on a PPI. Per GI. Biopsies from prior gastric ulcer of 09/22/2016 positive for adenocarcinoma. Repeat biopsies with poorly differentiated carcinoma. Patient with early [place]. Oncology followed the patient throughout the hospitalization.  7. Hypokalemia Likely secondary to GI losses. Repleted.  8 prognosis Patient's pain not fully controlled. Patient was miserable. Patient with some abdominal distention and early satiey. Patient with poor oral intake. Pain medications have been adjusted per palliative care for better pain control. Patient was subsequently discharged home with hospice on pain regimen. Further management of pain per hospice.     Procedures:  Upper endoscopy 09/30/2016--Dr. Carlean Purl  CT abdomen and pelvis 09/25/2016  CT-guided neurolytic ablation of the celiac axis per Dr. Jarvis Newcomer 09/30/2016  Abdominal x-ray 09/26/2016, 09/25/2016, 10/02/2016  Consultations:  Oncology: [name] 09/27/2016  Palliative care: Dr.Golding 09/26/2016  Discharge Exam: Vitals:    10/07/16 0455 10/07/16 1408  BP: 119/76 106/73  Pulse: 93 (!) 105  Resp: 18 16  Temp: 98.6 F (37 C) 99 F (37.2 C)  SpO2: 99% 92%    General: Sedated Cardiovascular: RRR Respiratory: CTAB anterior lung fields.  Discharge Instructions   Discharge Instructions    Diet general    Complete by:  As directed    Increase activity slowly    Complete by:  As directed      Current Discharge Medication List    START taking these medications   Details  bisacodyl (DULCOLAX) 5 MG EC tablet Take 1 tablet (5 mg total) by mouth daily as needed for moderate constipation. Qty: 30 tablet, Refills: 0    dronabinol (MARINOL) 2.5 MG capsule Take 1 capsule (2.5 mg total) by mouth 3 (three) times daily. 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 Qty: 237 mL, Refills: 0    fentaNYL (DURAGESIC - DOSED MCG/HR) 50 MCG/HR Place 1 patch (50 mcg total) onto the skin every other day. 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 Remove & Discard patch within 12 hours or as directed by MD Qty: 15 patch, Refills: 0    LORazepam (ATIVAN) 0.5 MG tablet Take 0.5 tablets (0.25 mg total) by mouth 2 (two) times daily. Qty: 20 tablet, Refills: 0    Morphine Sulfate (MORPHINE CONCENTRATE) 10 MG/0.5ML SOLN  concentrated solution Place 1-2 mLs (20-40 mg total) under the tongue every hour as needed for moderate pain, severe pain, anxiety or shortness of breath. Qty: 30 mL, Refills: 0    traZODone (DESYREL) 50 MG tablet Take 1 tablet (50 mg total) by mouth at bedtime. Qty: 20 tablet, Refills: 0      CONTINUE these medications which have NOT CHANGED   Details  albuterol (PROVENTIL,VENTOLIN) 90 MCG/ACT inhaler Inhale 2 puffs  into the lungs every 4 (four) hours as needed for wheezing.     cyclobenzaprine (FLEXERIL) 5 MG tablet Take 1 tablet (5 mg total) by mouth 3 (three) times daily as needed for muscle spasms. Qty: 30 tablet, Refills: 0    denosumab (XGEVA) 120 MG/1.7ML SOLN injection Inject 120 mg into the skin every 30 (thirty) days.    dicyclomine (BENTYL) 20 MG tablet Take 20 mg by mouth 3 (three) times daily as needed (Stomach Cramps).    fluticasone (FLONASE) 50 MCG/ACT nasal spray Place 1 spray into both nostrils daily.    Fluticasone-Salmeterol (ADVAIR DISKUS) 100-50 MCG/DOSE AEPB Inhale 1 puff into the lungs 2 (two) times daily before a meal.   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    metoCLOPramide (REGLAN) 10 MG tablet Take 1 tablet (10 mg total) by mouth every 8 (eight) hours as needed for nausea or vomiting. Qty: 30 tablet, Refills: 0    montelukast (SINGULAIR) 10 MG tablet Take 10 mg by mouth at bedtime.     pantoprazole (PROTONIX) 40 MG tablet Take 1 tablet (40 mg total) by mouth 2 (two) times daily before a meal. Qty: 60 tablet, Refills: 1    Probiotic Product (PROBIOTIC DAILY PO) Take 1 tablet by mouth daily.     promethazine (PHENERGAN) 12.5 MG tablet Take 1 tablet (12.5 mg total) by mouth every 6 (six) hours as needed for nausea or vomiting. Qty: 60 tablet, Refills: 0    sucralfate (CARAFATE) 1 GM/10ML suspension Take 10 mLs (1 g total) by mouth 4 (four) times daily -  with meals and at bedtime. Qty: 420 mL, Refills: 0      STOP taking these medications     amLODipine (NORVASC) 5 MG tablet      CABOMETYX 40 MG TABS      Calcium Carbonate-Vitamin D (CALCIUM + D PO)      fentaNYL (DURAGESIC - DOSED MCG/HR) 12 MCG/HR      HYDROcodone-acetaminophen (NORCO) 5-325 MG tablet      HYDROmorphone (DILAUDID) 2 MG tablet      Misc Natural Products (OSTEO BI-FLEX ADV DOUBLE ST PO)      predniSONE  (DELTASONE) 5 MG tablet      vitamin C (ASCORBIC ACID) 500 MG tablet      nystatin (MYCOSTATIN) 100000 UNIT/ML suspension      pilocarpine (SALAGEN) 5 MG tablet      ranitidine (ZANTAC) 150 MG tablet        Allergies  Allergen Reactions  . Azithromycin Other (See Comments)    Stomach cramps.  Green black stool   . Ciprofloxacin Other (See Comments)    Body aches Chills Heart races  . Etodolac Palpitations and Other (See Comments)    Joints hurt   . Gnp [Acetaminophen] Other (See Comments)    "GRN" (cough syrup) Heart races  . Moxifloxacin Palpitations and Other (See Comments)    Body ache  . Tramadol Nausea  And Vomiting and Other (See Comments)    Dizziness  . Rofecoxib Other (See Comments)    Pt unsure    Follow-up Information    [facility] Follow up.   Specialty:  Hospice and Palliative Medicine Why:  For home hospice services Contact information: [place] [id number] [phone]        Volanda Napoleon, MD. Schedule an appointment as soon as possible for a visit in 1 week(s).   Specialty:  Oncology Why:  f/u in 1-2 weeks. Contact information: [place] [address] [phone]            The results of significant diagnostics from this hospitalization (including imaging, microbiology, ancillary and laboratory) are listed below for reference.    Significant Diagnostic Studies: Ct Abdomen Pelvis Wo Contrast  Result Date: 09/13/2016 CLINICAL DATA:  Abdominal pain and bloating 1-2 weeks. History of metastatic renal cell carcinoma. EXAM: CT ABDOMEN AND PELVIS WITHOUT CONTRAST TECHNIQUE: Multidetector CT imaging of the abdomen and pelvis was performed following the standard protocol without IV contrast. COMPARISON:  09/11/2016 FINDINGS: Lower chest: Multiple small nodules over the lung bases unchanged compatible known metastatic disease. Small hiatal hernia. Hepatobiliary: Previous  cholecystectomy. Stable hypodense mass over the far lateral aspect of the left lobe of the liver abutting the lesser curvature of the stomach. Pancreas: Within normal. Spleen: Within normal. Adrenals/Urinary Tract: Bilateral renal masses unchanged compatible with metastatic disease. Evidence of previous left nephrectomy. Right kidney is normal in size without hydronephrosis or nephrolithiasis. Suggestion of a couple small right renal cysts unchanged. Right ureter and bladder are normal. Stomach/Bowel: Small hiatal hernia. Small bowel is within normal. Previous appendectomy. Colon is within normal. Vascular/Lymphatic: Mild-to-moderate calcified plaque over the abdominal aorta and iliac arteries. Few small periaortic lymph nodes unchanged. Reproductive: Previous hysterectomy.  Ovaries not visualized. Other: No free fluid or inflammatory change. Musculoskeletal: Known stable metastatic disease throughout the bony structures predominately including the spine and sternum. Stable T12 compression fracture post kyphoplasty. IMPRESSION: No evidence of bowel obstruction. No acute findings in the abdomen/pelvis. No significant change in patient's known metastatic renal cell carcinoma with involvement of lung, liver, adrenal glands and bones. Stable moderate pathologic T12 compression fracture post kyphoplasty. Few small bilateral renal cysts unchanged. Aortic Atherosclerosis (ICD10-I70.0). Electronically Signed   By: Marin Olp M.D.   On: 09/13/2016 16:18   Dg Thoracic Spine 2 View  Result Date: 09/21/2016 CLINICAL DATA:  Chronic back pain. Abdominal and epigastric pain for several weeks. No known injury. History of metastatic renal cell carcinoma per previous radiology reports. EXAM: THORACIC SPINE 2 VIEWS COMPARISON:  Chest CT and whole body bone scan 09/11/2016. FINDINGS: No visible ribs at T12. There is a stable chronic fracture at T12 with asymmetric loss of vertebral body height on the right and a resulting convex  left scoliosis. Spinal augmentation has been performed at this level. Scattered small lytic lesions seen on CT are not discernible. No evidence of pathologic fracture or widening of the interpedicular distance. Lower cervical spondylosis noted. IMPRESSION: Stable radiographic appearance of the thoracic spine status post spinal augmentation for metastatic disease at T12. No acute findings seen. Electronically Signed   By: Richardean Sale M.D.   On: 09/21/2016 13:38   Dg Abd 1 View  Result Date: 09/26/2016 CLINICAL DATA:  71 y/o F; metastatic renal cell carcinoma to the bone and recent EGD showing gastric adenocarcinoma. Patient complains of abdominal pain for about 3  weeks. EXAM: ABDOMEN - 1 VIEW COMPARISON:  09/25/2016 CT abdomen and pelvis. FINDINGS: Nonobstructive bowel gas pattern. There is retained contrast within the colon from prior CT of abdomen and pelvis. Multiple lucent lesions are present throughout the spine compatible with metastatic disease and there is a stable compression deformity of the T12 vertebral body post kyphoplasty with focal levocurvature. IMPRESSION: 1. Nonobstructive bowel gas pattern. Retained contrast within the colon from prior CT of abdomen and pelvis. 2. Multiple lytic lesions in the spine and stable compression deformity of T12 vertebral body post kyphoplasty with focal levocurvature. Electronically Signed   By: Kristine Garbe M.D.   On: 09/26/2016 16:22   Ct Abdomen Pelvis W Contrast  Result Date: 09/25/2016 CLINICAL DATA:  Abdominal pain for the past 2.5 weeks. Previous cholecystectomy, appendectomy and inguinal hernia repair. History of renal cell carcinoma metastatic to the bone diagnosed in 2016. EXAM: CT ABDOMEN AND PELVIS WITH CONTRAST TECHNIQUE: Multidetector CT imaging of the abdomen and pelvis was performed using the standard protocol following bolus administration of intravenous contrast. CONTRAST:  158mL ISOVUE-300 IOPAMIDOL (ISOVUE-300) INJECTION 61%  COMPARISON:  Abdomen radiographs obtained earlier today. Abdomen and pelvis CT dated 09/19/2016 and chest CT dated 09/11/2016. FINDINGS: Lower chest: Multiple lung nodules are again demonstrated at the lung bases. These demonstrate a mild increase in size and number. The largest is at the right lateral lung base, measuring 1.4 x 1.3 cm in maximum diameter on image number 1 of series 4 today. This measured 1.1 x 0.9 cm on 09/19/2016 and 1.1 x 0.9 cm on 09/11/2016. Hepatobiliary: A 80 lobulated, heterogeneously enhancing mass in the lateral segment of the left lobe of the liver is similar to the previous examinations, measuring 2.6 x 2.2 cm on coronal image number 40. A tiny probable cyst in the right lobe of the liver has not changed significantly. Cholecystectomy clips. Pancreas: Stable atrophy of the head, neck, body and proximal tail and mild atrophy of the distal tail. Spleen: Normal in size without focal abnormality. Adrenals/Urinary Tract: Little change in the heterogeneously enhancing mass in the mid right kidney. This measures 1.2 cm in AP diameter on image number 26 of series 2 and 1.6 x 1.3 cm on coronal image number 66. This previously measured 1.3 cm in maximum diameter. Multiple right renal cysts are also again demonstrated. Surgically absent left kidney. A heterogeneously enhancing left adrenal mass is again demonstrated, measuring 3.5 x 2.5 cm on image number 20 of series 2. This measured 3.0 x 2.0 cm on 09/11/2016. There is also a mildly heterogeneous right adrenal mass measuring 1.5 x 1.5 cm on image number 21 of series 2. This measured 1.7 x 1.1 cm on 09/11/2016. An additional 1.2 x 0.9 cm heterogeneously enhancing right adrenal mass on image number 18 of series 2 measured 1.0 x 0.8 cm on 09/11/2016. Unremarkable urinary bladder and right ureter. Stomach/Bowel: Increased caliber of the cecum, right colon and transverse colon, containing oral contrast without wall thickening, stricture or mass.  Unremarkable stomach and small bowel. Vascular/Lymphatic: Atheromatous arterial calcifications without aneurysm. Mild increase in size and confluence of retroperitoneal lymph nodes. The largest is an aortocaval node with a short axis diameter of 9 mm on image number 29 of series 2. Reproductive: Status post hysterectomy. No adnexal masses. Other: No abdominal wall hernia or abnormality. No abdominopelvic ascites. Musculoskeletal: Multiple lytic and sclerotic spine metastases without significant change. Stable sclerosis in collapse of the T12 vertebral body with kyphoplasty material. IMPRESSION: 1. No acute abnormality.  There has been increase in caliber of the cecum, right colon and transverse colon without evidence of obstruction. 2. Mildly progressive metastatic disease at the lung bases. 3. Stable probable metastasis in the lateral segment of the left lobe of the liver. 4. 1.6 cm mid right renal mass with a mild increase in size, suspicious for a primary right renal neoplasm or metastasis. 5. 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 6. Minimally progressive retroperitoneal adenopathy, most likely metastatic. 7. No significant change in extensive spinal metastatic disease. Electronically Signed   By: Claudie Revering M.D.   On: 09/25/2016 18:34   Ct Abdomen Pelvis W Contrast  Result Date: 09/19/2016 CLINICAL DATA:  Epigastric abdominal pain for 3 weeks. EXAM: CT ABDOMEN AND PELVIS WITH CONTRAST TECHNIQUE: Multidetector CT imaging of the abdomen and pelvis was performed using the standard protocol following bolus administration of intravenous contrast. CONTRAST:  19mL ISOVUE-300 IOPAMIDOL (ISOVUE-300) INJECTION 61% COMPARISON:  CT scan of September 13, 2016. FINDINGS: Lower chest: Multiple pulmonary metastases are seen in both lung bases. Hepatobiliary: Status post cholecystectomy. Stable left hepatic mass seen consistent with metastatic disease. Pancreas: Unremarkable. No pancreatic ductal  dilatation or surrounding inflammatory changes. Spleen: Normal in size without focal abnormality. Adrenals/Urinary Tract: Stable bilateral adrenal masses are noted concerning for metastatic disease, left greater than right. Status post left nephrectomy. No hydronephrosis or renal obstruction is seen involving the right kidney. Multiple cysts are noted. 1.3 cm partially enhancing solid mass is seen in midpole cortex of right kidney concerning for malignancy. Urinary bladder is decompressed. Stomach/Bowel: The stomach is unremarkable. Status post appendectomy. There is no evidence of bowel obstruction or inflammation. Vascular/Lymphatic: Aortic atherosclerosis. No enlarged abdominal or pelvic lymph nodes. Reproductive: Status post hysterectomy. No adnexal masses. Other: No abdominal wall hernia or abnormality. No abdominopelvic ascites. Musculoskeletal: Multiple metastatic lesions are noted in the spine with stable pathologic fracture of T12 vertebral body. IMPRESSION: Continued presence of pulmonary, osseous, left hepatic and adrenal metastases. Aortic atherosclerosis. Status post left nephrectomy. Multiple cysts are noted in the right kidney, although there is a 1.3 cm partially enhancing solid mass seen in midpole cortex of right kidney concerning for possible malignancy. MRI may be performed further evaluation. Electronically Signed   By: Marijo Conception, M.D.   On: 09/19/2016 14:52   Ct Biopsy  Result Date: 09/30/2016 CLINICAL DATA:  metastatic renal cell cancer to bone and newly diagnosed gastric cancer. Biopsy done on 7/30 pathology revealed adenocarcinoma. She is currently on Chemotherapy. CT scan 7/27 showed pulmonary,bone, liver and adrenal metastasis. She has frequent ED visit due to severe, uncontrolled pain. She complains of abdominal pain that goes through to her back. We are asked to celiac block for pain control. EXAM: CT GUIDED NEUROLYTIC ABLATION OF THE CELIAC AXIS ANESTHESIA/SEDATION: Intravenous  Fentanyl and Versed were administered as conscious sedation during continuous monitoring of the patient's level of consciousness and physiological / cardiorespiratory status by the radiology RN, with a total moderate sedation time of 48 minutes. PROCEDURE: The procedure risks, benefits, and alternatives were explained to the patient. Questions regarding the procedure were encouraged and answered. The patient understands and consents to the procedure. The anterior abdominal wall was prepped with chlorhexidine in a sterile fashion, and a sterile drape was applied covering the operative field. A sterile gown and sterile gloves were used for the procedure. Local anesthesia was provided with 1% Lidocaine. A 22 gauge Chiba needle was advanced under CT guidance from a left anterolateral approach to  the level of the celiac plexus. After confirming and adjusting needle tip position, approximately three ml of a 1:10 dilution of Omnipaque-300 contrast and lidocaine 1% was injected. Spread of diluted contrast material to the left of midline was confirmed by CT. In similar fashion, from a right parasagittal approach a 22 gauge Chiba needle was advanced under CT guidance to the level of the celiac plexus. After confirming needle tip position, approximately three ml of a 1:10 dilution of Omnipaque-300 contrast and lidocaine 1% was injected. Spread of diluted contrast material to the right of midline was confirmed by CT. Injection of 20 mL lidocaine 1% was then divided between 2 sites. Patient was observed for 15 minutes at number no adverse complications. Alcohol ablation of the celiac plexus was then performed with injection of 40 ml of absolute alcohol divided between the 2 sites. CT confirms appropriate alcohol distribution, no bleeding or other apparent complication. COMPLICATIONS: None FINDINGS: Contrast injection showed excellent spread bilaterally at the level of the celiac plexus. Alcohol ablation was successfully  performed. The patient tolerated the procedure well. IMPRESSION: 1. Technically successful CT guided neurolytic ablation of the celiac plexus as above. Electronically Signed   By: Lucrezia Europe M.D.   On: 09/30/2016 14:52   Dg Abd 2 Views  Result Date: 09/25/2016 CLINICAL DATA:  Abdominal pain with bloating. Known metastatic renal carcinoma EXAM: ABDOMEN - 2 VIEW COMPARISON:  CT abdomen and pelvis September 19, 2016 FINDINGS: Supine and upright images were obtained. There are loops of dilated bowel with multiple air-fluid levels. No free air appreciable. There are surgical clips in the right upper abdomen. There is thoracolumbar levoscoliosis, stable. There is evidence of fracture at T12 with metastatic disease in this area. IMPRESSION: Findings felt to be indicative of a degree of bowel obstruction. No free air. Fracture with bony metastasis at T12 again noted. There is thoracolumbar levoscoliosis. These results will be called to the ordering clinician or representative by the Radiologist Assistant, and communication documented in the PACS or zVision Dashboard. Electronically Signed   By: Lowella Grip III M.D.   On: 09/25/2016 13:16   Dg Abd Portable 2v  Result Date: 10/02/2016 CLINICAL DATA:  Abdominal pain EXAM: PORTABLE ABDOMEN - 2 VIEW COMPARISON:  09/25/2016 CT FINDINGS: The bowel gas pattern is normal. No abnormal mass effect or gas collection. No evidence of pneumoperitoneum. Known pulmonary nodules. Patient has history of metastatic renal cell carcinoma. L1 pathologic fracture with levoscoliosis. IMPRESSION: 1. No acute finding. Normal bowel gas pattern and no evidence of pneumoperitoneum. 2. Known bony and pulmonary metastatic disease. Electronically Signed   By: Monte Fantasia M.D.   On: 10/02/2016 09:35    Microbiology: Recent Results (from the past 240 hour(s))  Culture, Urine     Status: Abnormal   Collection Time: 10/02/16  8:05 AM  Result Value Ref Range Status   Specimen Description  URINE, CLEAN CATCH  Final   Special Requests NONE  Final   Culture (A)  Final    <10,000 COLONIES/mL INSIGNIFICANT GROWTH Performed at [facility], [address], [place], [place] [address]    Report Status 10/04/2016 FINAL  Final     Labs: Basic Metabolic Panel:  Recent Labs Lab 10/02/16 0334 10/03/16 0820 10/04/16 0357 10/05/16 0418 10/06/16 0426  NA 139 140 140 142 143  K 3.2* 3.2* 3.1* 4.1 4.8  CL 108 111 113* 114* 113*  CO2 23 21* 22 22 24   GLUCOSE 106* 123* 101* 99 105*  BUN 22* 13 11 7 9   CREATININE 0.85 0.83 0.85 0.83 0.80  CALCIUM 7.4* 7.2* 6.9* 6.9* 7.7*  MG  --   --  1.7 2.2  --    Liver Function Tests:  Recent Labs Lab 10/03/16 0820 10/06/16 0426  AST 34 18  ALT 39 27  ALKPHOS 43 41  BILITOT 0.3 0.3  PROT 5.6* 5.5*  ALBUMIN 3.2* 2.8*   No results for input(s): LIPASE, AMYLASE in the last 168 hours. No results for input(s): AMMONIA in the last 168 hours. CBC:  Recent Labs Lab 10/01/16 0343 10/02/16 0334 10/03/16 0820 10/06/16 0426  WBC 5.4 4.6 5.3 6.3  NEUTROABS  --  3.2 3.9 4.4  HGB 10.8* 10.6* 10.9* 10.2*  HCT 34.0* 32.1* 34.1* 32.3*  MCV 96.6 94.7 97.2 99.1  PLT 214 189 220 200   Cardiac Enzymes: No results for input(s): CKTOTAL, CKMB, CKMBINDEX, TROPONINI in the last 168 hours. BNP: BNP (last 3 results) No results for input(s): BNP in the last 8760 hours.  ProBNP (last 3 results) No results for input(s): PROBNP in the last 8760 hours.  CBG: No results for input(s): GLUCAP in the last 168 hours.     SignedIrine Seal MD.  [facility] 10/07/2016, 4:08 PM

## 2016-10-07 NOTE — Progress Notes (Signed)
Overall, Cathy Lewis is about the same. Maybe, the pain could be a little bit better. It looks like she slept pretty good last night.  There is a lot of anxiety over the pain. Possibly, some Ativan on schedule might help.  I also will try her on some Marinol. This also might help with pain. It also could help with some nausea and might help with her appetite.  She definitely is getting weaker. I don't think she really eating all that much.  She did walk a little bit yesterday.  I talked with Dr. Hilma Favors yesterday. I think trying to get her home with hospice certainly is not a bad idea.  I did speak with her daughter on the phone yesterday. I updated her as to what was going on. I told her that I does not think that Cathy Lewis was going to be a candidate for any further therapy. She just is not strong enough. She is not eating area and if she has intestinal dysfunction because of her cancer, this will make it very difficult for her to get in adequate nutrition. I told her daughter that if her intestinal function does not improve, I would be surprised if she made it through September. She was not too surprised by this.  On her physical exam, everything looks pretty stable. Her vital signs are okay. Her temperatures 98.6. Blood pressure 119/76. Her abdomen is not as distended. Bowel sounds might be a little bit more active. There is some guarding in the upper portion of the abdomen to palpation. There is no fluid wave. Her lungs are clear. Cardiac exam regular rate and rhythm. Extremities shows no clubbing, cyanosis or edema. Neurological exam is nonfocal.  Again, I just feel bad that Cathy Lewis has this pain. Again maybe a little schedule Ativan and Marinol might help. Maybe, she'll be able to eat a little bit better and be more comfortable.  I absolutely have no problems with her being followed by hospice.  I would just be surprised if she improved to the point that she would even be oh to take  any more therapy.  I'll have to talk to her about end-of-life issues and CODE STATUS. I'm sure she would not want to be kept alive on life support.  [name], MD  [place] [phone]

## 2016-10-07 NOTE — Progress Notes (Signed)
Hospice and [facility]) Hospital Liaison:  RN visit.   Notified by Marney Doctor, CMRN, of patient/family request for [facility] services at home after discharge. Chart and patient information under review by [facility] physician. Hospice eligibility pending at this time.  Writer spoke with family at bedside to initiate education related to hospice philosophy, services and team approach to care.  Patient/family verbalized understanding of information given.  Per discussion, plan is for discharge to home by [facility] on 10/07/16.  Patient will need prescriptions for discharge comfort medications. DME needs have been discussed, patient currently has the following equipment in the home: walker, 3 and 1, shower chair.  Patient/family have no requests for additional DME at this time.   [facility] aware of the above.  Completed discharge summary will need to be faxed to [facility] at [phone] when final.  Please notify [facility] when patient is ready to leave the unit at discharge. (Call [phone] or [phone] after 5pm.)  [facility] information and contact numbers given to son and husband during this visit.  Above information shared with Marney Doctor, CMRN.  Please call with any hospice related questions.  Thank you for this referral. Farrel Gordon, RN , [facility] Liaison [phone] liaisons are now on [place].

## 2016-10-09 ENCOUNTER — Telehealth: Payer: Self-pay | Admitting: *Deleted

## 2016-10-09 NOTE — Telephone Encounter (Signed)
Received call from hospice that patient passed away today at 4:42a in her home.  Dr Marin Olp notified.

## 2016-10-25 DEATH — deceased

## 2018-01-19 IMAGING — CT CT HEAD W/O CM
3 series · 17 of 47 positions shown, 20 images · non-contrast
Comparison: MRI head 01/26/2015

CLINICAL DATA: Metastatic renal cell carcinoma.  Nausea vomiting

EXAM:
CT HEAD WITHOUT CONTRAST
TECHNIQUE: Contiguous axial images were obtained from the base of the skull
through the vertex without intravenous contrast.

[Series 2: head wo · axial · 0.44mm/px · z∈[-146,-25]mm · 11 of 30 slices shown, 14 images]
[im 3/30  brain]
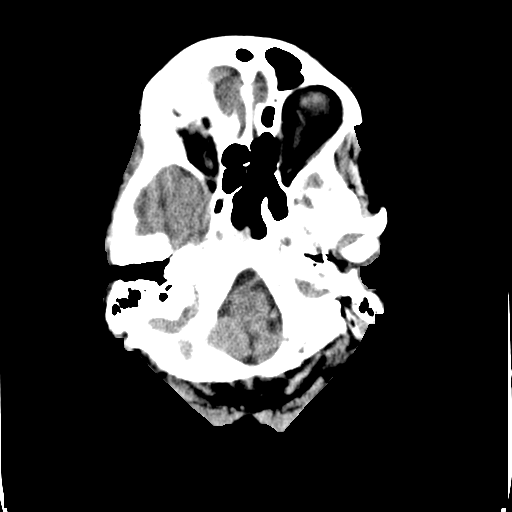
[im 3/30  bone]
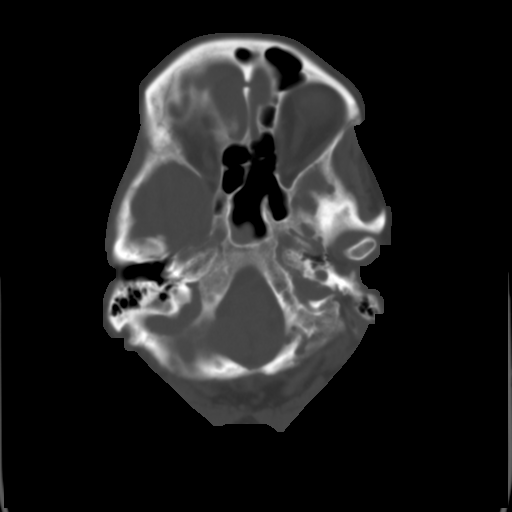
[im 5/30  brain]
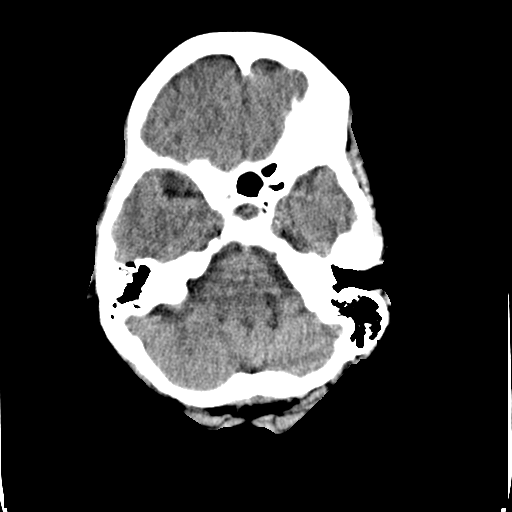
[im 8/30  brain]
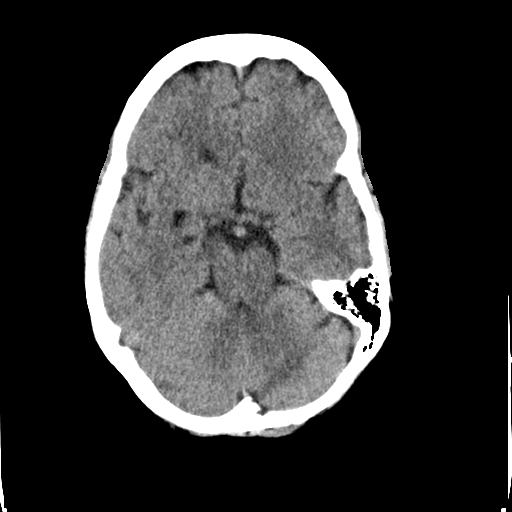
[im 10/30  brain]
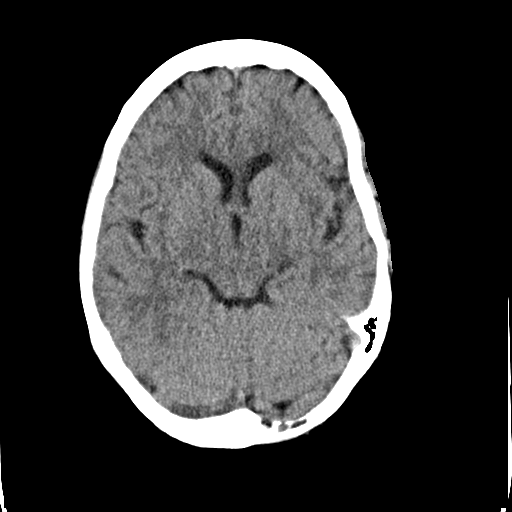
[im 13/30  brain]
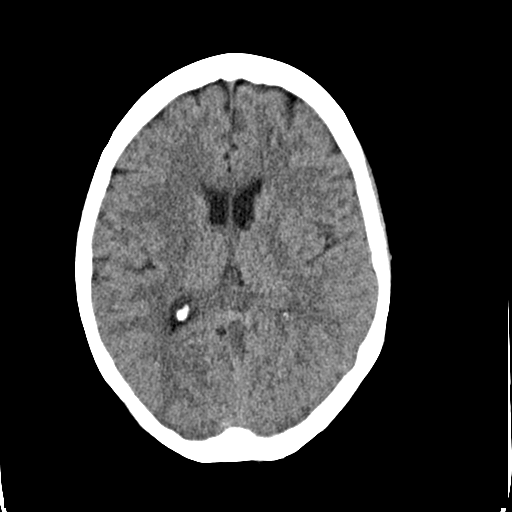
[im 13/30  bone]
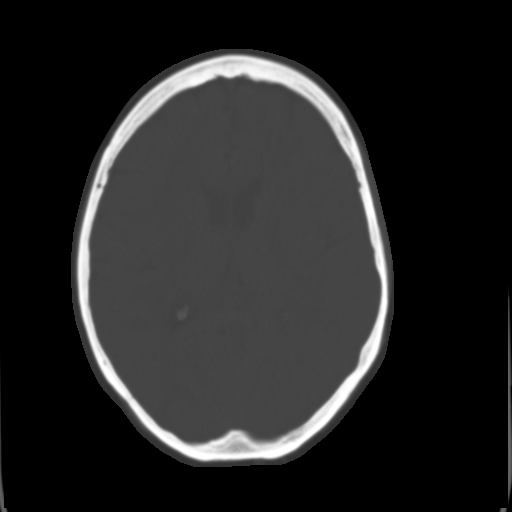
[im 16/30  brain]
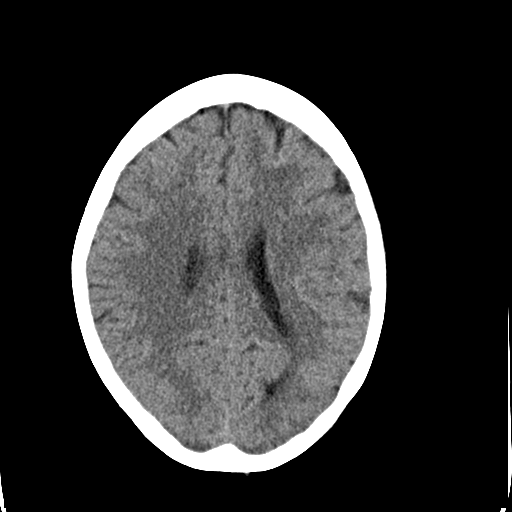
[im 18/30  brain]
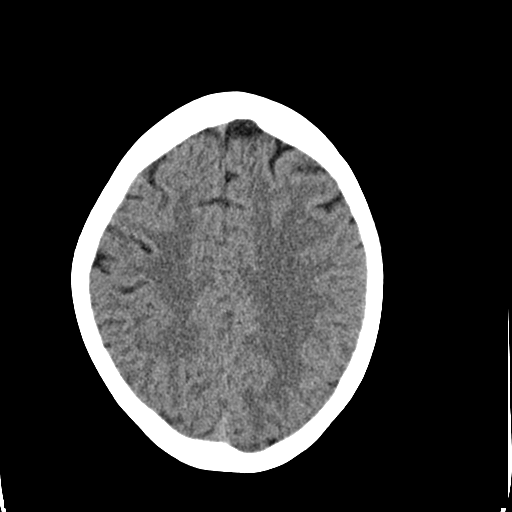
[im 21/30  brain]
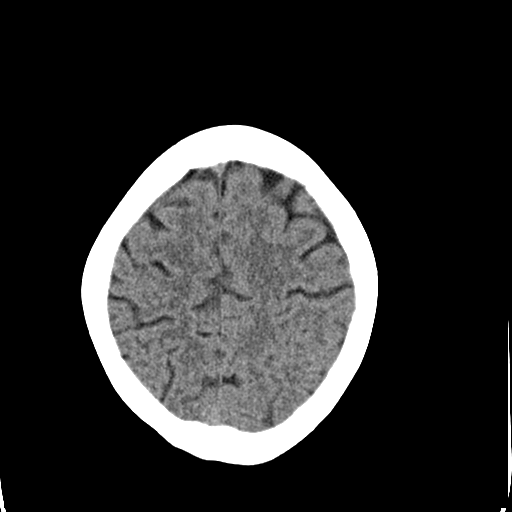
[im 23/30  brain]
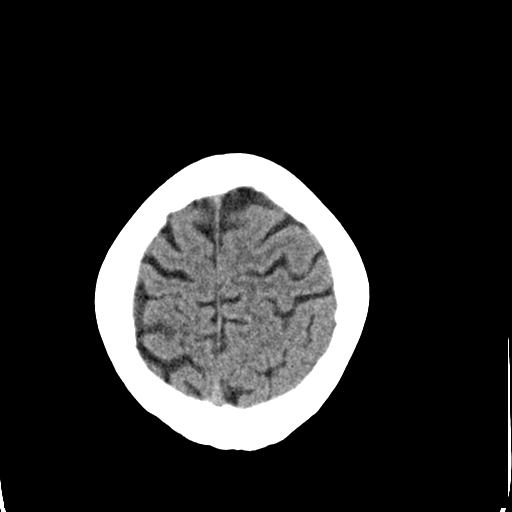
[im 23/30  bone]
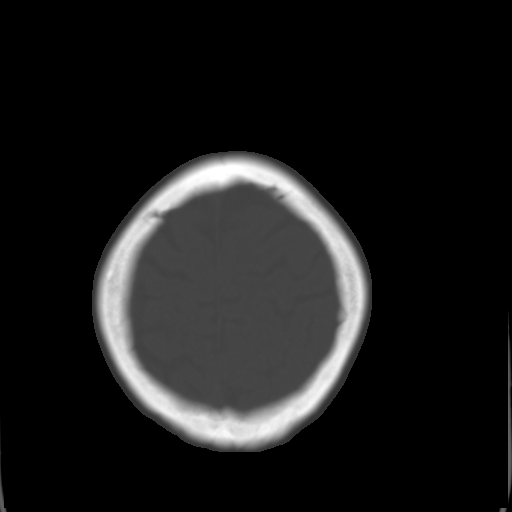
[im 26/30  brain]
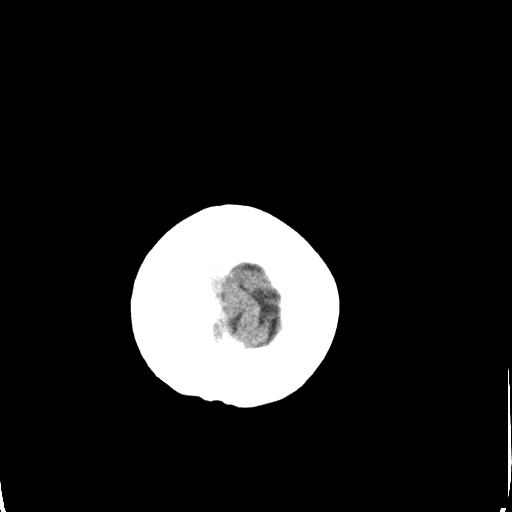
[im 28/30  brain]
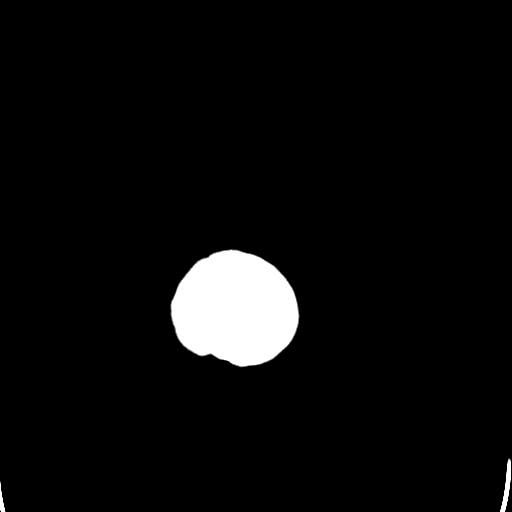

[Series 602: <mpr range> · coronal · 0.50mm/px · 3 of 61 slices shown]
[im 21/61  brain]
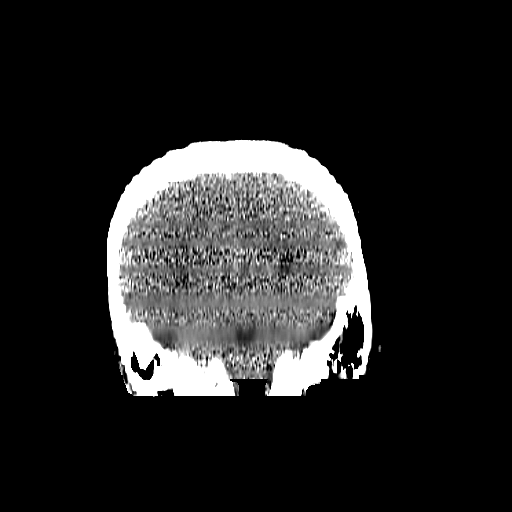
[im 27/61  brain]
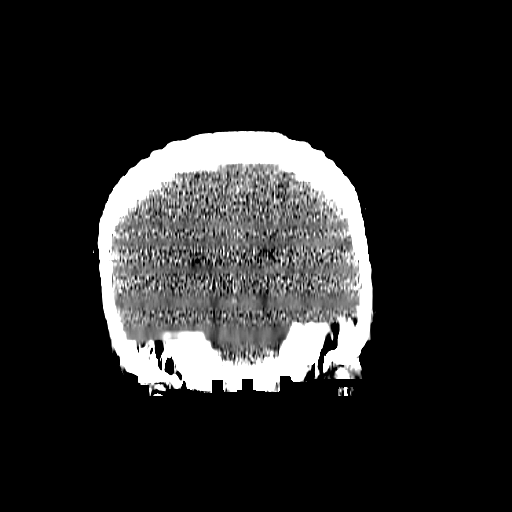
[im 34/61  brain]
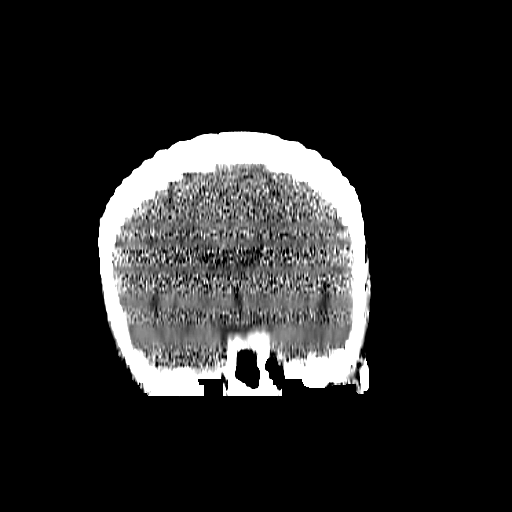

[Series 603: <mpr range(1)> · sagittal · 0.50mm/px · 3 of 48 slices shown]
[im 16/48  brain]
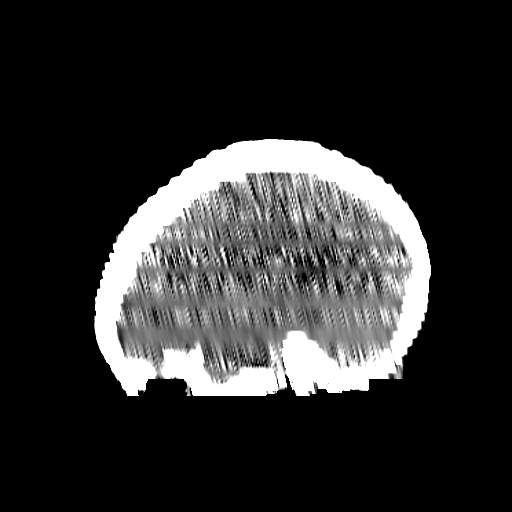
[im 24/48  brain]
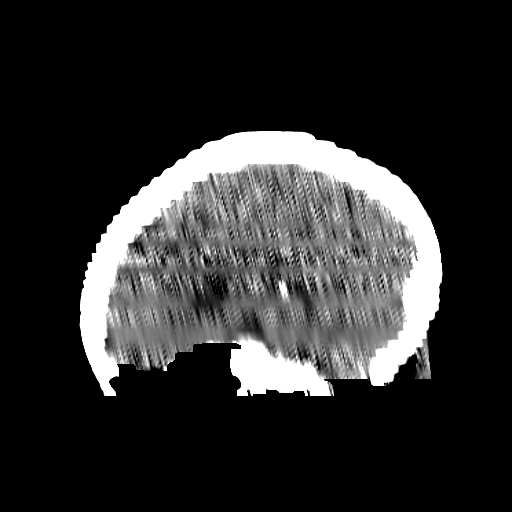
[im 32/48  brain]
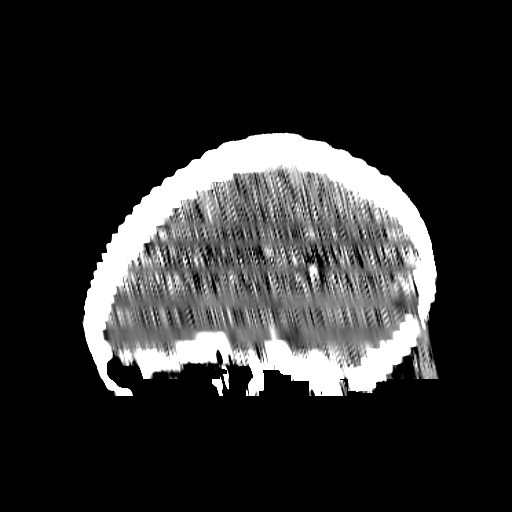

[17 of 47 positions shown; findings below may reference images not displayed]

FINDINGS: Ventricle size is normal. Mild white matter changes better seen on
MRI.

Negative for acute infarct. Negative for intracranial hemorrhage or
fluid collection. Negative for mass or edema. No evidence of
metastatic disease on unenhanced images.

Negative calvarium.  Mild mucosal edema in the sphenoid sinus.
IMPRESSION: No acute intracranial abnormality. No evidence of metastatic
disease. Lack of intravenous contrast limits sensitivity for
metastatic disease.

## 2018-01-19 IMAGING — CT CT ABD-PELV W/O CM
2 of 4 series · 14 of 46 positions shown, 16 images · non-contrast
Comparison: 07/13/2015.

CLINICAL DATA: Lower abdominal pain and distention for 1 week.
Nausea and vomiting for the last 3 days. History of left renal cell
carcinoma, status post left nephrectomy with metastatic disease to
bone.

EXAM:
CT ABDOMEN AND PELVIS WITHOUT CONTRAST
TECHNIQUE: Multidetector CT imaging of the abdomen and pelvis was performed
following the standard protocol without IV contrast.

[Series 2: axial st · axial · 0.75mm/px · z∈[-503,-128]mm · 11 of 91 slices shown, 13 images]
[im 8/91  soft-tissue]
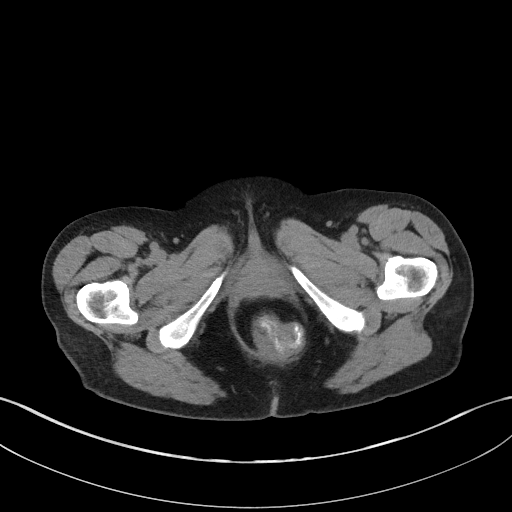
[im 8/91  bone]
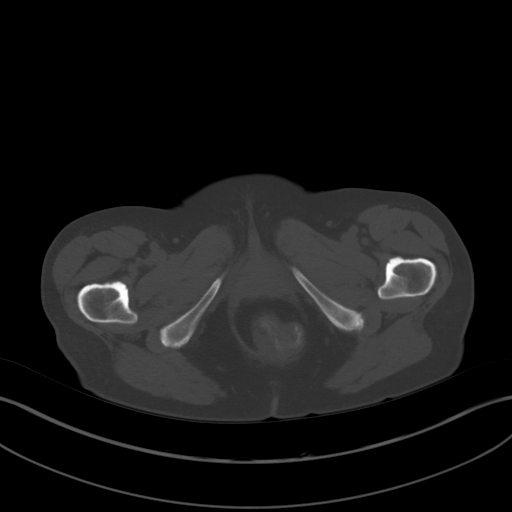
[im 15/91  soft-tissue]
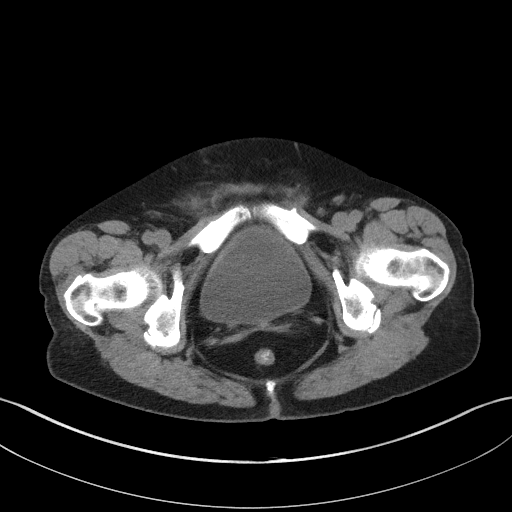
[im 22/91  soft-tissue]
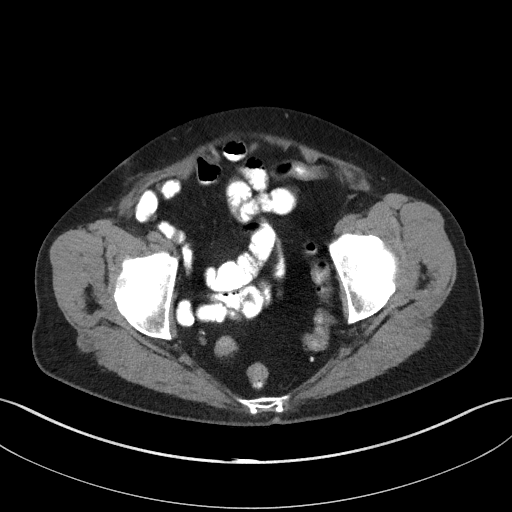
[im 29/91  soft-tissue]
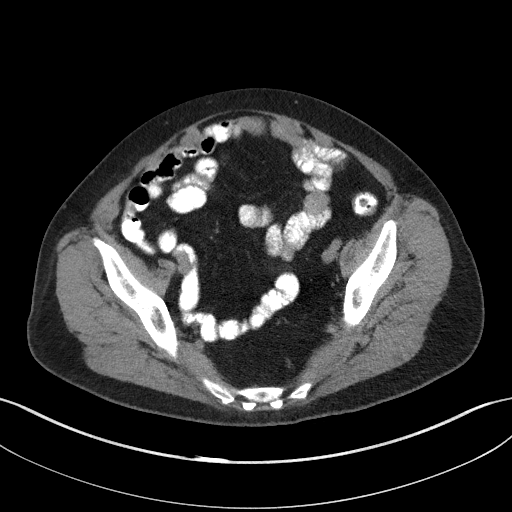
[im 37/91  soft-tissue]
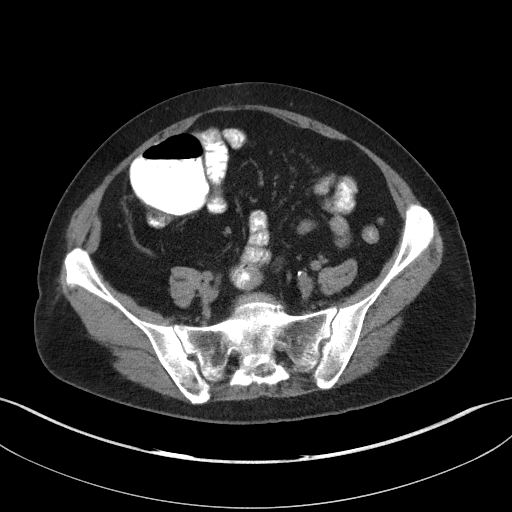
[im 47/91  soft-tissue]
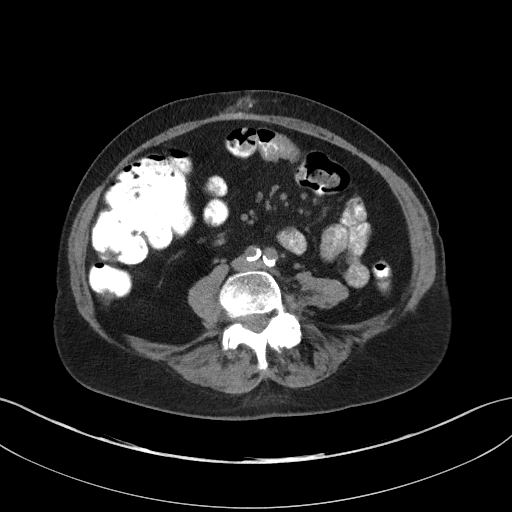
[im 55/91  soft-tissue]
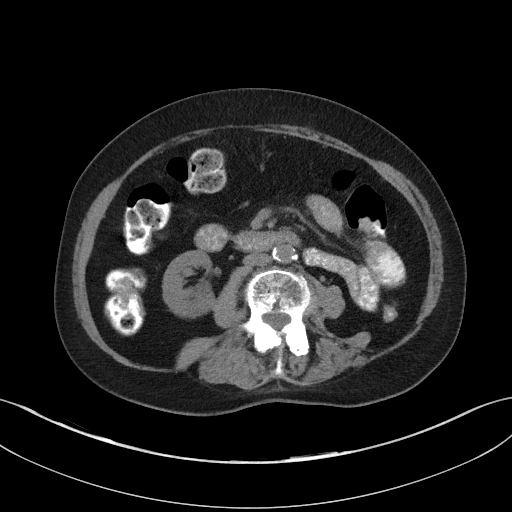
[im 62/91  soft-tissue]
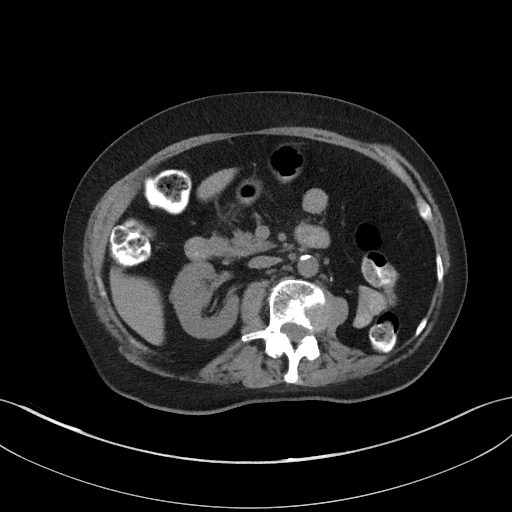
[im 69/91  soft-tissue]
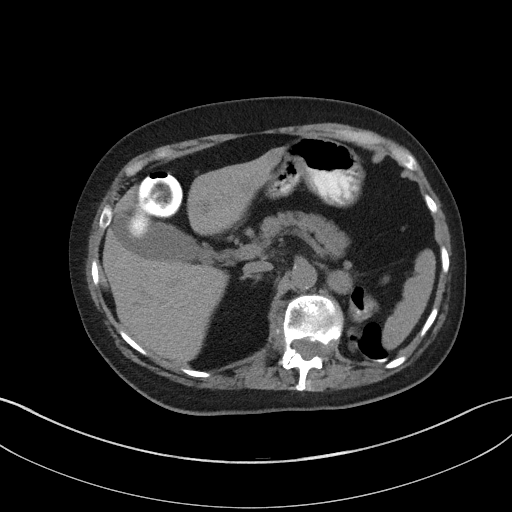
[im 69/91  bone]
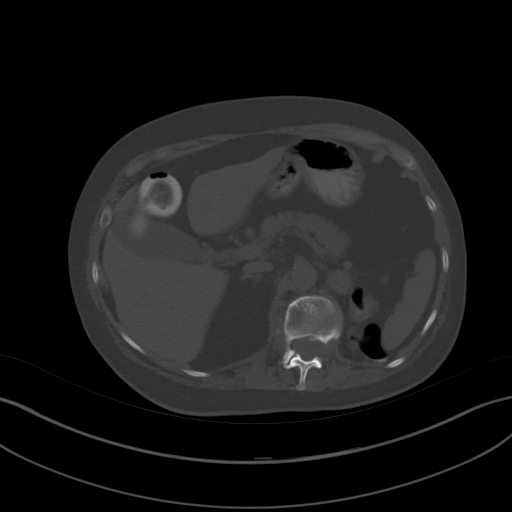
[im 76/91  soft-tissue]
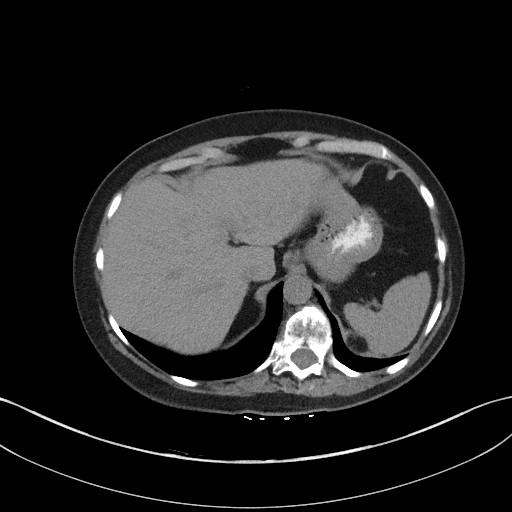
[im 83/91  soft-tissue]
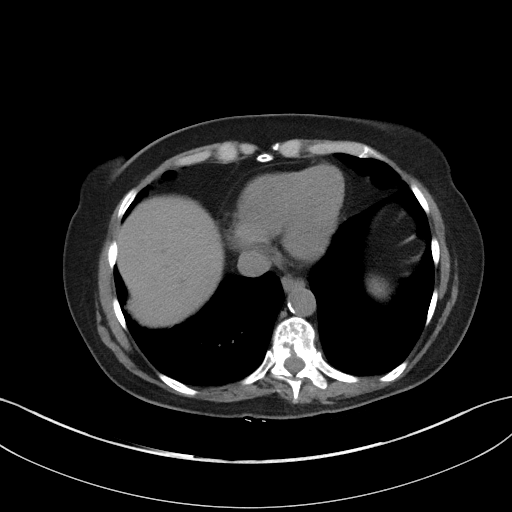

[Series 4: coronal st · coronal · 0.66mm/px · 3 of 96 slices shown]
[im 32/96  soft-tissue]
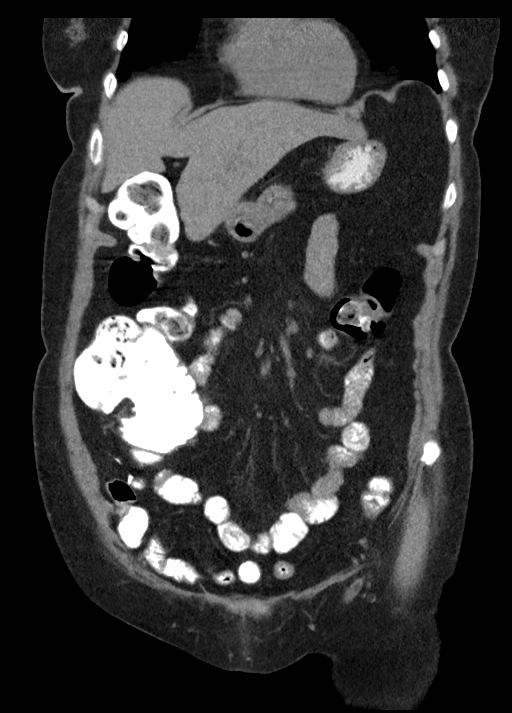
[im 43/96  soft-tissue]
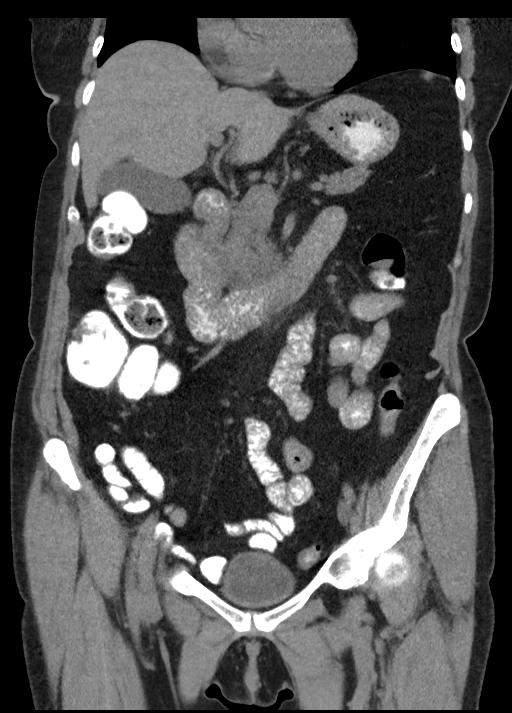
[im 53/96  soft-tissue]
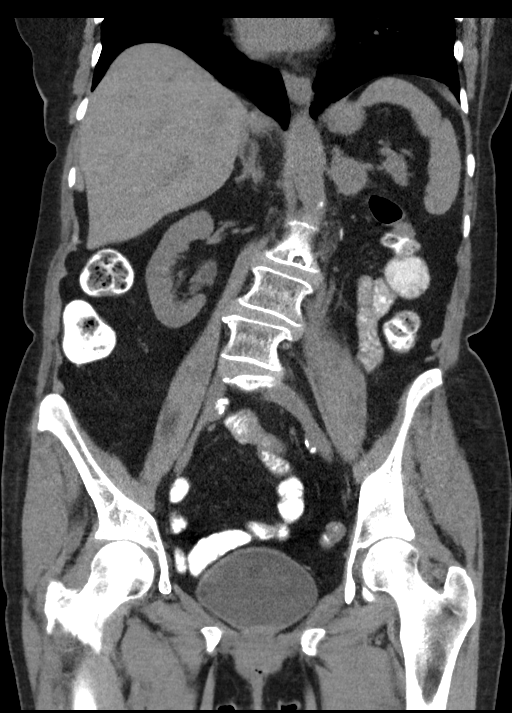

[14 of 46 positions shown; findings below may reference images not displayed]

FINDINGS: Lower chest: Multiple bilateral pulmonary nodules are again noted,
measuring up to 7 mm in the right lung base (image 3 series 6).

Hepatobiliary: No focal abnormality in the liver on this study
without intravenous contrast. No evidence of hepatomegaly. There is
no evidence for gallstones, gallbladder wall thickening, or
pericholecystic fluid. No intrahepatic or extrahepatic biliary
dilation.

Pancreas: No focal mass lesion. No dilatation of the main duct. No
intraparenchymal cyst. No peripancreatic edema.

Spleen: No splenomegaly. No focal mass lesion.

Adrenals/Urinary Tract: Left adrenal metastatic lesion not
substantially changed in the interval measuring 2.7 x 1.8 cm today
compared to 2.6 x 1.7 cm previously. Other soft tissue nodules in
the posterior left upper abdomen are stable in the interval. Right
adrenal gland is normal. 7 mm subcortical lesion interpolar right
kidney is stable. 11 mm exophytic lesion in the lower pole the right
kidney is unchanged. No right hydronephrosis. No right hydroureter.
The urinary bladder appears normal for the degree of distention.

Stomach/Bowel: Stomach is nondistended. No gastric wall thickening.
No evidence of outlet obstruction. Duodenum is normally positioned
as is the ligament of Treitz. No small bowel wall thickening. No
small bowel dilatation. The terminal ileum is normal. The appendix
is not visualized, but there is no edema or inflammation in the
region of the cecum. Circumferential wall thickening in the distal
ascending colon was not present on the study from 11 days ago,
making it most compatible with peristalsis. No gross colonic mass.
No colonic wall thickening. No substantial diverticular change.

Vascular/Lymphatic: There is abdominal aortic atherosclerosis
without aneurysm. No evidence for para-aortic lymphadenopathy. No
pelvic sidewall lymphadenopathy.

Reproductive: Uterus is surgically absent. There is no adnexal mass.

Other: No intraperitoneal free fluid.

Musculoskeletal: 16 mm sclerotic lesion anterior L5 vertebral body
is stable. Other scattered spinal metastases are not substantially
changed although the patient has had apparent interval vertebral
augmentation at L1.
IMPRESSION: 1. No CT findings to explain the patient's history of nausea and
vomiting.
2. Status post left nephrectomy.
3. Multiple bilateral pulmonary nodules identified in the lung
bases, as seen on recent chest CT of 07/13/2015.
4. No substantial change in the left adrenal gland metastatic
lesion. Other soft tissue nodules in the posterior left upper
abdomen are also stable.
5. Multiple bony metastases without substantial interval change.
6. Small exophytic lesions right kidney, incompletely characterized
on this study without intravenous contrast material.

## 2023-02-23 ENCOUNTER — Encounter: Payer: Self-pay | Admitting: Hematology & Oncology
